# Patient Record
Sex: Female | Born: 1950 | Race: Black or African American | Hispanic: No | Marital: Married | Smoking: Former smoker
Health system: Southern US, Community
[De-identification: ages and names within clinical notes are randomized; demographics above are authoritative.]

## PROBLEM LIST (undated history)

## (undated) DIAGNOSIS — Z800 Family history of malignant neoplasm of digestive organs: Secondary | ICD-10-CM

## (undated) DIAGNOSIS — E119 Type 2 diabetes mellitus without complications: Secondary | ICD-10-CM

## (undated) DIAGNOSIS — N289 Disorder of kidney and ureter, unspecified: Secondary | ICD-10-CM

## (undated) DIAGNOSIS — L80 Vitiligo: Secondary | ICD-10-CM

## (undated) DIAGNOSIS — C50919 Malignant neoplasm of unspecified site of unspecified female breast: Secondary | ICD-10-CM

## (undated) DIAGNOSIS — I10 Essential (primary) hypertension: Secondary | ICD-10-CM

## (undated) DIAGNOSIS — C801 Malignant (primary) neoplasm, unspecified: Secondary | ICD-10-CM

## (undated) DIAGNOSIS — E039 Hypothyroidism, unspecified: Secondary | ICD-10-CM

## (undated) DIAGNOSIS — G4730 Sleep apnea, unspecified: Secondary | ICD-10-CM

## (undated) DIAGNOSIS — Z803 Family history of malignant neoplasm of breast: Secondary | ICD-10-CM

## (undated) DIAGNOSIS — M1990 Unspecified osteoarthritis, unspecified site: Secondary | ICD-10-CM

## (undated) DIAGNOSIS — H10022 Other mucopurulent conjunctivitis, left eye: Secondary | ICD-10-CM

## (undated) HISTORY — PX: CERVICAL FUSION: SHX112

## (undated) HISTORY — PX: CYSTECTOMY: SUR359

## (undated) HISTORY — DX: Family history of malignant neoplasm of breast: Z80.3

## (undated) HISTORY — PX: CHOLECYSTECTOMY: SHX55

## (undated) HISTORY — PX: UVULOPALATOPHARYNGOPLASTY, TONSILLECTOMY AND SEPTOPLASTY: SHX2632

## (undated) HISTORY — PX: EYE SURGERY: SHX253

## (undated) HISTORY — DX: Family history of malignant neoplasm of digestive organs: Z80.0

## (undated) HISTORY — PX: BREAST SURGERY: SHX581

## (undated) HISTORY — PX: PILONIDAL CYST EXCISION: SHX744

---

## 1984-06-16 HISTORY — PX: MYOMECTOMY: SHX85

## 1993-06-16 HISTORY — PX: PARTIAL HYSTERECTOMY: SHX80

## 1997-08-15 ENCOUNTER — Ambulatory Visit (HOSPITAL_COMMUNITY): Admission: RE | Admit: 1997-08-15 | Discharge: 1997-08-15 | Payer: Self-pay | Admitting: Internal Medicine

## 1997-10-02 ENCOUNTER — Inpatient Hospital Stay (HOSPITAL_COMMUNITY): Admission: RE | Admit: 1997-10-02 | Discharge: 1997-10-03 | Payer: Self-pay | Admitting: Neurosurgery

## 2000-05-22 ENCOUNTER — Ambulatory Visit (HOSPITAL_BASED_OUTPATIENT_CLINIC_OR_DEPARTMENT_OTHER): Admission: RE | Admit: 2000-05-22 | Discharge: 2000-05-22 | Payer: Self-pay | Admitting: Internal Medicine

## 2000-06-16 HISTORY — PX: TONSILLECTOMY: SUR1361

## 2000-09-09 ENCOUNTER — Ambulatory Visit (HOSPITAL_COMMUNITY): Admission: RE | Admit: 2000-09-09 | Discharge: 2000-09-10 | Payer: Self-pay | Admitting: *Deleted

## 2000-09-09 ENCOUNTER — Encounter: Payer: Self-pay | Admitting: *Deleted

## 2001-03-19 ENCOUNTER — Ambulatory Visit (HOSPITAL_BASED_OUTPATIENT_CLINIC_OR_DEPARTMENT_OTHER): Admission: RE | Admit: 2001-03-19 | Discharge: 2001-03-19 | Payer: Self-pay | Admitting: *Deleted

## 2001-05-26 ENCOUNTER — Ambulatory Visit (HOSPITAL_COMMUNITY): Admission: RE | Admit: 2001-05-26 | Discharge: 2001-05-26 | Payer: Self-pay | Admitting: *Deleted

## 2002-05-24 ENCOUNTER — Other Ambulatory Visit: Admission: RE | Admit: 2002-05-24 | Discharge: 2002-05-24 | Payer: Self-pay | Admitting: Obstetrics and Gynecology

## 2002-05-30 ENCOUNTER — Ambulatory Visit (HOSPITAL_COMMUNITY): Admission: RE | Admit: 2002-05-30 | Discharge: 2002-05-30 | Payer: Self-pay | Admitting: Obstetrics and Gynecology

## 2002-05-30 ENCOUNTER — Encounter: Payer: Self-pay | Admitting: Obstetrics and Gynecology

## 2003-07-04 ENCOUNTER — Other Ambulatory Visit: Admission: RE | Admit: 2003-07-04 | Discharge: 2003-07-04 | Payer: Self-pay | Admitting: Obstetrics and Gynecology

## 2003-07-21 ENCOUNTER — Encounter: Admission: RE | Admit: 2003-07-21 | Discharge: 2003-07-21 | Payer: Self-pay | Admitting: Obstetrics and Gynecology

## 2005-03-07 ENCOUNTER — Other Ambulatory Visit: Admission: RE | Admit: 2005-03-07 | Discharge: 2005-03-07 | Payer: Self-pay | Admitting: Obstetrics and Gynecology

## 2005-03-18 ENCOUNTER — Encounter: Admission: RE | Admit: 2005-03-18 | Discharge: 2005-03-18 | Payer: Self-pay | Admitting: Obstetrics and Gynecology

## 2006-03-09 ENCOUNTER — Other Ambulatory Visit: Admission: RE | Admit: 2006-03-09 | Discharge: 2006-03-09 | Payer: Self-pay | Admitting: Obstetrics and Gynecology

## 2017-06-16 HISTORY — PX: CATARACT EXTRACTION: SUR2

## 2017-09-30 DIAGNOSIS — M722 Plantar fascial fibromatosis: Secondary | ICD-10-CM | POA: Insufficient documentation

## 2017-10-20 ENCOUNTER — Encounter (INDEPENDENT_AMBULATORY_CARE_PROVIDER_SITE_OTHER): Payer: Self-pay | Admitting: Ophthalmology

## 2017-10-21 ENCOUNTER — Encounter (INDEPENDENT_AMBULATORY_CARE_PROVIDER_SITE_OTHER): Payer: BLUE CROSS/BLUE SHIELD | Admitting: Ophthalmology

## 2017-10-21 DIAGNOSIS — H43813 Vitreous degeneration, bilateral: Secondary | ICD-10-CM

## 2017-10-21 DIAGNOSIS — E11319 Type 2 diabetes mellitus with unspecified diabetic retinopathy without macular edema: Secondary | ICD-10-CM

## 2017-10-21 DIAGNOSIS — E113292 Type 2 diabetes mellitus with mild nonproliferative diabetic retinopathy without macular edema, left eye: Secondary | ICD-10-CM

## 2017-10-21 DIAGNOSIS — H2513 Age-related nuclear cataract, bilateral: Secondary | ICD-10-CM

## 2017-10-21 DIAGNOSIS — H35373 Puckering of macula, bilateral: Secondary | ICD-10-CM

## 2017-10-22 ENCOUNTER — Encounter (INDEPENDENT_AMBULATORY_CARE_PROVIDER_SITE_OTHER): Payer: Self-pay | Admitting: Ophthalmology

## 2017-12-07 ENCOUNTER — Other Ambulatory Visit: Payer: Self-pay | Admitting: General Surgery

## 2017-12-07 ENCOUNTER — Ambulatory Visit
Admission: RE | Admit: 2017-12-07 | Discharge: 2017-12-07 | Disposition: A | Discharge status: Home / Self Care | Payer: BLUE CROSS/BLUE SHIELD | Source: Ambulatory Visit | Attending: General Surgery | Admitting: General Surgery

## 2017-12-07 DIAGNOSIS — Z170 Estrogen receptor positive status [ER+]: Principal | ICD-10-CM

## 2017-12-07 DIAGNOSIS — C50411 Malignant neoplasm of upper-outer quadrant of right female breast: Secondary | ICD-10-CM

## 2017-12-11 ENCOUNTER — Ambulatory Visit
Admission: RE | Admit: 2017-12-11 | Discharge: 2017-12-11 | Disposition: A | Discharge status: Home / Self Care | Payer: BLUE CROSS/BLUE SHIELD | Source: Ambulatory Visit | Attending: General Surgery | Admitting: General Surgery

## 2017-12-11 ENCOUNTER — Other Ambulatory Visit: Payer: Self-pay | Admitting: General Surgery

## 2017-12-11 DIAGNOSIS — Z170 Estrogen receptor positive status [ER+]: Principal | ICD-10-CM

## 2017-12-11 DIAGNOSIS — C50411 Malignant neoplasm of upper-outer quadrant of right female breast: Secondary | ICD-10-CM

## 2017-12-15 ENCOUNTER — Encounter: Payer: Self-pay | Admitting: *Deleted

## 2017-12-15 ENCOUNTER — Inpatient Hospital Stay: Payer: BLUE CROSS/BLUE SHIELD | Attending: Hematology and Oncology | Admitting: Hematology and Oncology

## 2017-12-15 DIAGNOSIS — C50411 Malignant neoplasm of upper-outer quadrant of right female breast: Secondary | ICD-10-CM | POA: Diagnosis present

## 2017-12-15 DIAGNOSIS — Z170 Estrogen receptor positive status [ER+]: Secondary | ICD-10-CM

## 2017-12-15 DIAGNOSIS — Z8042 Family history of malignant neoplasm of prostate: Secondary | ICD-10-CM

## 2017-12-15 DIAGNOSIS — E119 Type 2 diabetes mellitus without complications: Secondary | ICD-10-CM | POA: Diagnosis not present

## 2017-12-15 DIAGNOSIS — Z79899 Other long term (current) drug therapy: Secondary | ICD-10-CM | POA: Diagnosis not present

## 2017-12-15 DIAGNOSIS — Z800 Family history of malignant neoplasm of digestive organs: Secondary | ICD-10-CM

## 2017-12-15 DIAGNOSIS — C773 Secondary and unspecified malignant neoplasm of axilla and upper limb lymph nodes: Secondary | ICD-10-CM | POA: Diagnosis not present

## 2017-12-15 DIAGNOSIS — Z7984 Long term (current) use of oral hypoglycemic drugs: Secondary | ICD-10-CM | POA: Diagnosis not present

## 2017-12-15 NOTE — Progress Notes (Signed)
[facility] CONSULT NOTE  Patient Care Team: [name], MD as PCP - General (Family Medicine)  CHIEF COMPLAINTS/PURPOSE OF CONSULTATION:  Newly diagnosed breast cancer  HISTORY OF PRESENTING ILLNESS:  Charlene Perry 67 y.o. female is here because of recent diagnosis of right breast cancer.  Patient felt a nodule on the right breast and brought to the attention of her physician.  Mammogram was obtained which revealed abnormality in the right breast with distortion.  She then underwent ultrasound which revealed a 1.4 cm lesion.  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  She was referred to see Dr. Donne Hazel who then obtained an axillary ultrasound and it revealed a lymph node which was then biopsied.  Biopsy revealed invasive ductal carcinoma.  She was sent to [place] for discussion regarding adjuvant treatment options.  I reviewed her records extensively and collaborated the history with the patient.  SUMMARY OF ONCOLOGIC HISTORY:   Malignant neoplasm of upper-outer quadrant of right breast in female, estrogen receptor positive ([place])   11/12/2017 Initial Diagnosis    Palpable lesion in the right breast upper outer quadrant mammogram and ultrasound revealed 1.4 cm tumor ultrasound-guided biopsy revealed grade 2 IDC ER 3+, PR 3+, HER-2 negative with a Ki-67 of 10%.  Right axillary lymph node biopsy positive, T1cN1 stage I a clinical stage     MEDICAL HISTORY:  Osteoarthritis, gallstones, diabetes, hemorrhoids, sleep apnea, hypothyroidism SURGICAL HISTORY: Cataract surgery, C-section, gallbladder surgery, hysterectomy, spine surgery in the neck SOCIAL HISTORY: Married denies any tobacco alcohol or recreational drug use FAMILY HISTORY: Colon cancer in brother mother and sister, prostate cancer father  ALLERGIES:  has No Known Allergies.  MEDICATIONS:  Current Outpatient  Medications  Medication Sig Dispense Refill  . celecoxib (CELEBREX) 200 MG capsule Take 200 mg by mouth daily.    . furosemide (LASIX) 80 MG tablet Take 80 mg by mouth daily.    Marland Kitchen gabapentin (NEURONTIN) 400 MG capsule Take 400 mg by mouth 2 (two) times daily.    Marland Kitchen levothyroxine (SYNTHROID, LEVOTHROID) 125 MCG tablet Take 125 mcg by mouth daily before breakfast.    . metFORMIN (GLUCOPHAGE) 1000 MG tablet Take 1,000 mg by mouth 2 (two) times daily with a meal.    . potassium chloride (K-DUR,KLOR-CON) 10 MEQ tablet Take 10 mEq by mouth 2 (two) times daily.    . rosuvastatin (CRESTOR) 40 MG tablet Take 40 mg by mouth daily.     No current facility-administered medications for this visit.     REVIEW OF SYSTEMS:   Constitutional: Denies fevers, chills or abnormal night sweats Eyes: Denies blurriness of vision, double vision or watery eyes Ears, nose, mouth, throat, and face: Denies mucositis or sore throat Respiratory: Denies cough, dyspnea or wheezes Cardiovascular: Denies palpitation, chest discomfort or lower extremity swelling Gastrointestinal:  Denies nausea, heartburn or change in bowel habits Skin: Denies abnormal skin rashes Lymphatics: Denies new lymphadenopathy or easy bruising Neurological:Denies numbness, tingling or new weaknesses Behavioral/Psych: Mood is stable, no new changes  Breast:  Denies any palpable lumps or discharge All other systems were reviewed with the patient and are negative.  PHYSICAL EXAMINATION: ECOG PERFORMANCE STATUS: 1 - Symptomatic but completely ambulatory  Vitals:   12/15/17 1233  BP: (!) 158/70  Pulse: 91  Resp: 19  Temp: 98.1 F (36.7 C)  SpO2: 98%   Filed Weights   12/15/17 1233  Weight: 226 lb 14.4 oz (102.9  kg)    GENERAL:alert, no distress and comfortable SKIN: skin color, texture, turgor are normal, no rashes or significant lesions EYES: normal, conjunctiva are pink and non-injected, sclera clear OROPHARYNX:no exudate, no erythema  and lips, buccal mucosa, and tongue normal  NECK: supple, thyroid normal size, non-tender, without nodularity LYMPH:  no palpable lymphadenopathy in the cervical, axillary or inguinal LUNGS: clear to auscultation and percussion with normal breathing effort HEART: regular rate & rhythm and no murmurs and no lower extremity edema ABDOMEN:abdomen soft, non-tender and normal bowel sounds Musculoskeletal:no cyanosis of digits and no clubbing  PSYCH: alert & oriented x 3 with fluent speech NEURO: no focal motor/sensory deficits  RADIOGRAPHIC STUDIES: I have personally reviewed the radiological reports and agreed with the findings in the report.  ASSESSMENT AND PLAN:  Malignant neoplasm of upper-outer quadrant of right breast in female, estrogen receptor positive ([place]) 11/12/2017:Palpable lesion in the right breast upper outer quadrant mammogram and ultrasound revealed 1.4 cm tumor ultrasound-guided biopsy revealed grade 2 IDC ER 3+, PR 3+, HER-2 negative with a Ki-67 of 10%.  Right axillary lymph node biopsy positive, T1cN1 stage I a clinical stage  Pathology and radiology counseling:Discussed with the patient, the details of pathology including the type of breast cancer,the clinical staging, the significance of ER, PR and HER-2/neu receptors and the implications for treatment. After reviewing the pathology in detail, we proceeded to discuss the different treatment options between surgery, radiation, chemotherapy, antiestrogen therapies.  Recommendations: 1. Breast conserving surgery followed by 2. Mammaprint testing to determine if chemotherapy would be of any benefit followed by 3. Adjuvant radiation therapy followed by 4. Adjuvant antiestrogen therapy  Mammaprint counseling: MINDACT is a prospective, randomized phase III controlled trial that investigates the clinical utility of MammaPrint, when compared to standard clinical pathological criteria, with 6,693 patients enrolled from over 111  institutions. Clinical high-risk patients with a Low Risk MammaPrint result, including 48% node-positive, had 5-year distant metastasis-free survival rate in excess of 94 percent, whether randomized to receive adjuvant chemotherapy or not proving MammaPrint's ability to safely identify Low Risk patients.  Return to clinic after surgery to discuss final pathology report and then determine if Mammaprint testing will need to be sent.     All questions were answered. The patient knows to call the clinic with any problems, questions or concerns.    Harriette Ohara, MD 12/15/17

## 2017-12-15 NOTE — Assessment & Plan Note (Signed)
11/12/2017:Palpable lesion in the right breast upper outer quadrant mammogram and ultrasound revealed 1.4 cm tumor ultrasound-guided biopsy revealed grade 2 IDC ER 3+, PR 3+, HER-2 negative with a Ki-67 of 10%.  Right axillary lymph node biopsy positive, T1cN1 stage I a clinical stage  Pathology and radiology counseling:Discussed with the patient, the details of pathology including the type of breast cancer,the clinical staging, the significance of ER, PR and HER-2/neu receptors and the implications for treatment. After reviewing the pathology in detail, we proceeded to discuss the different treatment options between surgery, radiation, chemotherapy, antiestrogen therapies.  Recommendations: 1. Breast conserving surgery followed by 2. Mammaprint testing to determine if chemotherapy would be of any benefit followed by 3. Adjuvant radiation therapy followed by 4. Adjuvant antiestrogen therapy  Mammaprint counseling: MINDACT is a prospective, randomized phase III controlled trial that investigates the clinical utility of MammaPrint, when compared to standard clinical pathological criteria, with 6,693 patients enrolled from over 111 institutions. Clinical high-risk patients with a Low Risk MammaPrint result, including 48% node-positive, had 5-year distant metastasis-free survival rate in excess of 94 percent, whether randomized to receive adjuvant chemotherapy or not proving MammaPrint's ability to safely identify Low Risk patients.  Return to clinic after surgery to discuss final pathology report and then determine if Mammaprint testing will need to be sent.

## 2017-12-16 ENCOUNTER — Telehealth: Payer: Self-pay | Admitting: *Deleted

## 2017-12-16 ENCOUNTER — Other Ambulatory Visit: Payer: Self-pay | Admitting: General Surgery

## 2017-12-16 ENCOUNTER — Encounter: Payer: Self-pay | Admitting: Radiation Oncology

## 2017-12-16 NOTE — Telephone Encounter (Signed)
Confirmed appointment with patient for Dr. Lisbeth Renshaw on 7/9 at 1230pm and then genetics at 2pm.

## 2017-12-21 NOTE — Progress Notes (Signed)
Location of Breast Cancer: Malignant neoplasm of upper outer quadrant of right female breast, ER/PR +  Did patient present with symptoms (if so, please note symptoms) or was this found on screening mammography?:  Patient felt a nodule in the right breast.  Mammogram: abnormality in the right breast with distortion.  Ultrasound: 1.4 cm lesion  Histology per Pathology Report: Right Breast 12/16/2017  Receptor Status: ER(+ 100%), PR (+ 70%), Her2-neu (-), Ki-67(10%)  Right Axilla 12/11/2017    Past/Anticipated interventions by surgeon, if any: Plan is for lumpectomy with Dr. Donne Hazel  Past/Anticipated interventions by medical oncology, if any: Chemotherapy  Dr. Lindi Adie 12/15/2017 1. Breast conserving surgery followed by 2. Mammaprint testing to determine if chemotherapy would be of any benefit followed by 3. Adjuvant radiation therapy followed by 4. Adjuvant antiestrogen therapy   Lymphedema issues, if any:    Pain issues, if any:    BP (!) 152/91 (BP Location: Right Arm, Patient Position: Sitting, Cuff Size: Normal)   Pulse 79   Temp 98.4 F (36.9 C) (Oral)   Resp 18   Ht _0  (1.651 m)   Wt 225 lb 9.6 oz (102.3 kg)   SpO2 97%   BMI 37.54 kg/m    Wt Readings from Last 3 Encounters:  12/22/17 225 lb 9.6 oz (102.3 kg)  12/15/17 226 lb 14.4 oz (102.9 kg)    SAFETY ISSUES:  Prior radiation?   Pacemaker/ICD? No  Possible current pregnancy? No  Is the patient on methotrexate? No  Current Complaints / other details:      Cori Razor, RN 12/21/2017,9:34 AM

## 2017-12-22 ENCOUNTER — Ambulatory Visit (HOSPITAL_BASED_OUTPATIENT_CLINIC_OR_DEPARTMENT_OTHER): Payer: BLUE CROSS/BLUE SHIELD | Admitting: Genetics

## 2017-12-22 ENCOUNTER — Other Ambulatory Visit: Payer: Self-pay

## 2017-12-22 ENCOUNTER — Inpatient Hospital Stay: Payer: BLUE CROSS/BLUE SHIELD

## 2017-12-22 ENCOUNTER — Ambulatory Visit
Admission: RE | Admit: 2017-12-22 | Discharge: 2017-12-22 | Disposition: A | Discharge status: Home / Self Care | Payer: BLUE CROSS/BLUE SHIELD | Source: Ambulatory Visit | Attending: Radiation Oncology | Admitting: Radiation Oncology

## 2017-12-22 ENCOUNTER — Encounter: Payer: Self-pay | Admitting: Radiation Oncology

## 2017-12-22 ENCOUNTER — Encounter: Payer: Self-pay | Admitting: Genetics

## 2017-12-22 VITALS — BP 152/91 | HR 79 | Temp 98.4°F | Resp 18 | Ht 65.0 in | Wt 225.6 lb

## 2017-12-22 DIAGNOSIS — Z170 Estrogen receptor positive status [ER+]: Secondary | ICD-10-CM | POA: Diagnosis present

## 2017-12-22 DIAGNOSIS — Z803 Family history of malignant neoplasm of breast: Secondary | ICD-10-CM

## 2017-12-22 DIAGNOSIS — Z800 Family history of malignant neoplasm of digestive organs: Secondary | ICD-10-CM

## 2017-12-22 DIAGNOSIS — C50411 Malignant neoplasm of upper-outer quadrant of right female breast: Secondary | ICD-10-CM

## 2017-12-22 DIAGNOSIS — C773 Secondary and unspecified malignant neoplasm of axilla and upper limb lymph nodes: Secondary | ICD-10-CM | POA: Diagnosis not present

## 2017-12-22 DIAGNOSIS — Z7982 Long term (current) use of aspirin: Secondary | ICD-10-CM | POA: Insufficient documentation

## 2017-12-22 HISTORY — DX: Vitiligo: L80

## 2017-12-22 NOTE — Progress Notes (Signed)
REFERRING PROVIDER: [name], MD [place], [place] [address]   PRIMARY PROVIDER:  [name], MD  PRIMARY REASON FOR VISIT:  1. Malignant neoplasm of upper-outer quadrant of right breast in female, estrogen receptor positive ([place])   2. Family history of colon cancer   3. Family history of breast cancer     HISTORY OF PRESENT ILLNESS:   Charlene Perry, a 67 y.o. female, was seen for a [facility] cancer genetics consultation at the request of Dr. Lorne Skeens due to a personal and family history of cancer.  Charlene Perry presents to clinic today to discuss the possibility of a hereditary predisposition to cancer, genetic testing, and to further clarify her future cancer risks, as well as potential cancer risks for family members.   On 11/12/217, at the age of 13, Charlene Perry was diagnosed with Invasive ductal carcinoma of the right breast ER+, PR+, HER2-.    She is planning breast conserving surgery followed by adjuvant radiation and adjuvant antiestrogen therapy.   CANCER HISTORY:    Malignant neoplasm of upper-outer quadrant of right breast in female, estrogen receptor positive ([place])   11/12/2017 Initial Diagnosis    Palpable lesion in the right breast upper outer quadrant mammogram and ultrasound revealed 1.4 cm tumor ultrasound-guided biopsy revealed grade 2 IDC ER 3+, PR 3+, HER-2 negative with a Ki-67 of 10%.  Right axillary lymph node biopsy positive, T1cN1 stage I a clinical stage        HORMONAL RISK FACTORS:  First live birth at age 51.  Ovaries intact: yes.  Hysterectomy: yes.  Menopausal status: postmenopausal.  Colonoscopy: yes; normal, goes every 5 years is schedule for next one in Aug 2019. Marland Kitchen   Past Medical History:  Diagnosis Date  . Family history of breast cancer   . Family history of colon cancer     Past Surgical History:  Procedure Laterality Date  . CATARACT EXTRACTION Right 2019  . CERVICAL  FUSION N/A    C5 &6  . CHOLECYSTECTOMY  2004-2005  . CYSTECTOMY    . MYOMECTOMY  1986  . PARTIAL HYSTERECTOMY  1995    Social History   Socioeconomic History  . Marital status: Married    Spouse name: Not on file  . Number of children: Not on file  . Years of education: Not on file  . Highest education level: Not on file  Occupational History  . Not on file  Social Needs  . Financial resource strain: Not on file  . Food insecurity:    Worry: Not on file    Inability: Not on file  . Transportation needs:    Medical: Not on file    Non-medical: Not on file  Tobacco Use  . Smoking status: Never Smoker  . Smokeless tobacco: Never Used  Substance and Sexual Activity  . Alcohol use: Not Currently  . Drug use: Never  . Sexual activity: Not on file  Lifestyle  . Physical activity:    Days per week: Not on file    Minutes per session: Not on file  . Stress: Not on file  Relationships  . Social connections:    Talks on phone: Not on file    Gets together: Not on file    Attends religious service: Not on file    Active member of club or organization: Not on file    Attends meetings of clubs or organizations: Not on file    Relationship status: Not on file  Other Topics Concern  . Not on file  Social History Narrative  . Not on file     FAMILY HISTORY:  We obtained a detailed, 4-generation family history.  Significant diagnoses are listed below: Family History  Problem Relation Age of Onset  . Colon cancer Mother 29  . Colon cancer Sister 23  . Colon cancer Brother        ex early 80's  . Breast cancer Other        52's   Charlene Perry has a 76 year-old son with no history of cancer.  Charlene Perry has an 86 month old granddaughter. Charlene Perry has 2 sisters and 7 brothers; -1 brother has no history of cancer.  He has a daughter who had breast cancer I her 96's. -1 brother was dx with colon cancer in his early 62's, he had a history of smoking -5 brothers  with no history of cancer.  -1 sister was diagnosed with colon cancer I her 29's and is now 13.  She has a hx of smoking.  -1 sister with no history of cancer.   Charlene Perry father: died at 49 with no history of cancer.  He had his "prostate trimmed" but no cancer.  Paternal Aunts/Uncles: many aunts/uncles, only was is known and he is deceased with no known history of cancer.  Paternal cousins: no history of cancer.  Paternal grandfather: unk Paternal grandmother:unk  Charlene Perry mother: died at 65.  She had a history of colon cancer dx at 21.  She used to smoke 'snuff' Maternal Aunts/Uncles: 4 maternal aunts, 1 maternal half-uncle- no known history of cancer.  Maternal cousins: no known history of cancer.  Maternal grandfather: unk Maternal grandmother:unk  Charlene Perry is unaware of previous family history of genetic testing for hereditary cancer risks. Patient's maternal ancestors are of African American descent, and paternal ancestors are of African American/Caucasian descent. There is no reported Ashkenazi Jewish ancestry. There is no known consanguinity.  GENETIC COUNSELING ASSESSMENT: Charlene Perry is a 67 y.o. female with a personal and family history  which is somewhat suggestive of a Hereditary Cancer Predisposition Syndrome. We, therefore, discussed and recommended the following at today's visit.   DISCUSSION: We reviewed the characteristics, features and inheritance patterns of hereditary cancer syndromes. We also discussed genetic testing, including the appropriate family members to test, the process of testing, insurance coverage and turn-around-time for results. We discussed the implications of a negative, positive and/or variant of uncertain significant result. We recommended Charlene Perry pursue genetic testing for the Common Hereditary Caners gene panel.   The Common Hereditary Cancer Panel offered by [facility] includes sequencing and/or  deletion duplication testing of the following 47 genes: APC, ATM, AXIN2, BARD1, BMPR1A, BRCA1, BRCA2, BRIP1, CDH1, CDKN2A (p14ARF), CDKN2A (p16INK4a), CKD4, CHEK2, CTNNA1, DICER1, EPCAM (Deletion/duplication testing only), GREM1 (promoter region deletion/duplication testing only), KIT, MEN1, MLH1, MSH2, MSH3, MSH6, MUTYH, NBN, NF1, NHTL1, PALB2, PDGFRA, PMS2, POLD1, POLE, PTEN, RAD50, RAD51C, RAD51D, SDHB, SDHC, SDHD, SMAD4, SMARCA4. STK11, TP53, TSC1, TSC2, and VHL.  The following genes were evaluated for sequence changes only: SDHA and HOXB13 c.251G>A variant only.  We discussed that only 5-10% of cancers are associated with a Hereditary cancer predisposition syndrome.  One of the most common hereditary cancer syndromes that increases breast cancer risk is called Hereditary Breast and Ovarian Cancer (HBOC) syndrome.  This syndrome is caused by mutations in the BRCA1 and BRCA2 genes.  This syndrome increases an individual's lifetime risk to develop  breast, ovarian, pancreatic, and other types of cancer.    The most common hereditary cancer syndrome associated with colon cancer is Lynch Syndrome.  Lynch Syndrome is caused by mutations in the genes: MLH1, MSH2, MSH6, PMS2 and EPCAM.  This syndrome increases the risk for colon, uterine, ovarian and stomach cancers, as well as others.  Families with Lynch Syndrome tend to have multiple family members with these cancers, typically diagnosed under age 10, and diagnoses in multiple generations.  There are also many other cancer predisposition syndromes caused by mutations in several other genes.  We discussed that if she is found to have a mutation in one of these genes, it may impact future medical management recommendations such as increased cancer screenings and consideration of risk reducing surgeries.  A positive result could also have implications for the patient's family members.  A Negative result would mean we were unable to identify a hereditary  component to her cancer, but does not rule out the possibility of a hereditary basis for her/her family's cancer.  There could be mutations that are undetectable by current technology, or in genes not yet tested or identified to increase cancer risk.    We discussed the potential to find a Variant of Uncertain Significance or VUS.  These are variants that have not yet been identified as pathogenic or benign, and it is unknown if this variant is associated with increased cancer risk or if this is a normal finding.  Most VUS's are reclassified to benign or likely benign.   It should not be used to make medical management decisions. With time, we suspect the lab will determine the significance of any VUS's identified if any.   Based on Ms. Bryant personal and family history of cancer, she meets medical criteria for genetic testing. Despite that she meets criteria, she may still have an out of pocket cost. The laboratory can provide her with an estimate of her OOP cost.   PLAN: After considering the risks, benefits, and limitations, Charlene Perry  provided informed consent to pursue genetic testing and the blood sample was sent to [facility] for analysis of the Common Hereditary Cancers Panel. Results should be available within approximately 2-3 weeks' time, at which point they will be disclosed by telephone to Charlene Perry, as will any additional recommendations warranted by these results. Ms. Vinal will receive a summary of her genetic counseling visit and a copy of her results once available. This information will also be available in Epic. We encouraged Charlene Perry to remain in contact with cancer genetics annually so that we can continuously update the family history and inform her of any changes in cancer genetics and testing that may be of benefit for her family. Ms. Dimarzo questions were answered to her satisfaction today. Our contact information was  provided should additional questions or concerns arise.  Lastly, we encouraged Charlene Perry to remain in contact with cancer genetics annually so that we can continuously update the family history and inform her of any changes in cancer genetics and testing that may be of benefit for this family.   Ms.  Urda questions were answered to her satisfaction today. Our contact information was provided should additional questions or concerns arise. Thank you for the referral and allowing [place] to share in the care of your patient.   Tana Felts, MS, [facility] Certified Genetic Counselor [email or web address] phone: [phone]  The patient was seen for a total of 40 minutes in face-to-face genetic counseling.  The  patient was accompanied today by her husband.  This patient was discussed with Drs. Magrinat, Lindi Adie and/or Burr Medico who agrees with the above.

## 2017-12-22 NOTE — Progress Notes (Signed)
Radiation Oncology         [phone] ________________________________  Name: Charlene Perry        MRN: [id number]  Date of Service: 12/22/2017 DOB: Jun 18, 1950  [id number], Charlene Musa, MD  [name], MD     REFERRING PHYSICIAN: [name], MD   DIAGNOSIS: The encounter diagnosis was Malignant neoplasm of upper-outer quadrant of right breast in female, estrogen receptor positive ([place]).   HISTORY OF PRESENT ILLNESS: NEYSHA Perry is a 67 y.o. female seen at the request of Dr. Lindi Adie  for a new diagnosis of right breast cancer. The patient was noted to have in the right breast which prompted evaluation with her provider and subsequent diagnostic mammogram on 11/12/2017 that revealed a suspicious abnormality, per notes about 1.4 cm in the right breast.  This prompted a biopsy at 10:00 of the right breast revealing an invasive and in situ mammary carcinoma supporting a lobular phenotype, grade 1-2.  The outside review revealed ER PR positive, HER-2/neu negative, Ki-67 of 10%.  When she presented for evaluation in [place], she was counseled on further assessment of the axilla, and subsequent ultrasound revealed two low level axillary nodes with minimal fat, and normal fatty nodes nearby, and a biopsy of one of the two suspcious lymph nodes revealed metastatic carcinoma in the lymph node, ER PR positive, HER-2 negative, Ki-67 of 10%. She is planning to proceed with breast conserving surgery followed by MammaPrint testing.  She comes today to meet with [place] to discuss options of adjuvant radiotherapy.   PREVIOUS RADIATION THERAPY: No   PAST MEDICAL HISTORY:  Past Medical History:  Diagnosis Date  . Family history of breast cancer   . Family history of colon cancer   . Vitiligo        PAST SURGICAL HISTORY: Past Surgical History:  Procedure Laterality Date  . CATARACT EXTRACTION Right 2019  . CERVICAL FUSION N/A    C5 &6  . CHOLECYSTECTOMY  2004-2005  .  CYSTECTOMY    . MYOMECTOMY  1986  . PARTIAL HYSTERECTOMY  1995     FAMILY HISTORY:  Family History  Problem Relation Age of Onset  . Colon cancer Mother 30  . Colon cancer Sister 46  . Colon cancer Brother        ex early 10's  . Breast cancer Other        40's     SOCIAL HISTORY:  reports that she has never smoked. She has never used smokeless tobacco. She reports that she drank alcohol. She reports that she does not use drugs. The patient is married and lives in [place], [place]. 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   ALLERGIES: Patient has no known allergies.   MEDICATIONS:  Current Outpatient Medications  Medication Sig Dispense Refill  . aspirin 81 MG chewable tablet 1 PO DAILY    . celecoxib (CELEBREX) 200 MG capsule Take 200 mg by mouth daily.    . furosemide (LASIX) 80 MG tablet Take 80 mg by mouth daily.    Marland Kitchen gabapentin (NEURONTIN) 400 MG capsule Take 400 mg by mouth 2 (two) times daily.    Marland Kitchen levothyroxine (SYNTHROID, LEVOTHROID) 125 MCG tablet Take 125 mcg by mouth daily before breakfast.    . metFORMIN (GLUCOPHAGE) 1000 MG tablet Take 1,000 mg by mouth 2 (two) times daily with a meal.    . potassium chloride (K-DUR,KLOR-CON) 10 MEQ tablet Take 10 mEq by mouth 2 (two) times daily.    Marland Kitchen  rosuvastatin (CRESTOR) 40 MG tablet Take 40 mg by mouth daily.    . benzonatate (TESSALON PERLES) 100 MG capsule Tessalon Perles 100 mg capsule  Take 1 capsule 3 times a day by oral route as needed.     No current facility-administered medications for this encounter.      REVIEW OF SYSTEMS: On review of systems, the patient reports that she is doing well overall. She denies any chest pain, shortness of breath, cough, fevers, chills, night sweats, unintended weight changes. She denies any bowel or bladder disturbances, and denies abdominal pain, nausea or vomiting. She denies any new musculoskeletal or joint aches or pains. A complete review of systems  is obtained and is otherwise negative.     PHYSICAL EXAM:  Wt Readings from Last 3 Encounters:  12/22/17 225 lb 9.6 oz (102.3 kg)  12/15/17 226 lb 14.4 oz (102.9 kg)   Temp Readings from Last 3 Encounters:  12/22/17 98.4 F (36.9 C) (Oral)  12/15/17 98.1 F (36.7 C) (Oral)   BP Readings from Last 3 Encounters:  12/22/17 (!) 152/91  12/15/17 (!) 158/70   Pulse Readings from Last 3 Encounters:  12/22/17 79  12/15/17 91     In general this is a well appearing caucasian female in no acute distress. She is alert and oriented x4 and appropriate throughout the examination. HEENT reveals that the patient is normocephalic, atraumatic. EOMs are intact.. Skin is intact without any evidence of gross lesions. Cardiopulmonary assessment is negative for acute distress and she exhibits normal effort. Breast exam is deferred.  ECOG = 1  0 - Asymptomatic (Fully active, able to carry on all predisease activities without restriction)  1 - Symptomatic but completely ambulatory (Restricted in physically strenuous activity but ambulatory and able to carry out work of a light or sedentary nature. For example, light housework, office work)  2 - Symptomatic, <50% in bed during the day (Ambulatory and capable of all self care but unable to carry out any work activities. Up and about more than 50% of waking hours)  3 - Symptomatic, >50% in bed, but not bedbound (Capable of only limited self-care, confined to bed or chair 50% or more of waking hours)  4 - Bedbound (Completely disabled. Cannot carry on any self-care. Totally confined to bed or chair)  5 - Death   Eustace Pen MM, Creech RH, Tormey DC, et al. [phone]). "Toxicity and response criteria of the [facility]". [place] Oncol. 5 (6): 649-55    LABORATORY DATA:  No results found for: WBC, HGB, HCT, MCV, PLT No results found for: NA, K, CL, CO2 No results found for: ALT, AST, GGT, ALKPHOS, BILITOT    RADIOGRAPHY: [place]  Axillary Node Core Biopsy Right  Addendum Date: 12/14/2017   ADDENDUM REPORT: 12/14/2017 13:16 ADDENDUM: Pathology revealed METASTATIC CARCINOMA of the Right axillary lymph node. This was found to be concordant by Dr. Kristopher Oppenheim. Pathology results were discussed with the patient by telephone. The patient reported doing well after the biopsy with tenderness at the site. Post biopsy instructions and care were reviewed and questions were answered. The patient was encouraged to call [facility] for any additional concerns. The patient has a recent diagnosis of right breast cancer and should follow her outlined treatment plan. Dr. [name] was notified of pathology results via EPIC message on December 14, 2017. Pathology results reported by Terie Purser, RN on 12/14/2017. Electronically Signed   By: Kristopher Oppenheim  M.D.   On: 12/14/2017 13:16   Result Date: 12/14/2017 CLINICAL DATA:  67 year old female biopsy proven right breast cancer presenting for ultrasound-guided biopsy an indeterminate right axillary lymph node. EXAM: [place] AXILLARY NODE CORE BIOPSY RIGHT COMPARISON:  Previous exam(s). FINDINGS: I met with the patient and we discussed the procedure of ultrasound-guided biopsy, including benefits and alternatives. We discussed the high likelihood of a successful procedure. We discussed the risks of the procedure, including infection, bleeding, tissue injury, clip migration, and inadequate sampling. Informed written consent was given. The usual time-out protocol was performed immediately prior to the procedure. Using sterile technique and 1% Lidocaine as local anesthetic, under direct ultrasound visualization, a 14 gauge spring-loaded device was used to perform biopsy of a right axillary lymph node using a lateral approach. At the conclusion of the procedure a HydroMARK tissue marker clip was deployed into the biopsy cavity. Follow up 2 view mammogram was performed and dictated  separately. IMPRESSION: Ultrasound guided biopsy of an indeterminate right axillary lymph. No apparent complications. Electronically Signed: By: Kristopher Oppenheim M.D. On: 12/11/2017 09:29   [place] Axilla Right  Result Date: 12/07/2017 CLINICAL DATA:  Patient as a recently biopsied right breast carcinoma. She was seen today by Dr. Donne Hazel for surgical consultation. No axillary ultrasound had been performed. The patient now comes to the breast Center for sonographic evaluation of the right axilla prior to resection of a right breast carcinoma. EXAM: ULTRASOUND OF THE RIGHT BREAST COMPARISON:  Diagnostic mammography and breast ultrasound dated 11/12/2017. FINDINGS: On physical exam, no axillary masses palpated. Targeted ultrasound is performed, showing 2 adjacent low axillary lymph nodes with minimal hilar fat. Both of these lymph nodes are normal in overall size, with short axis is between 5 and 6 mm. There are other axillary lymph nodes that are normal in size and configuration with prominent hilar fat. IMPRESSION: 1. Recommend ultrasound-guided core needle biopsy of 1 of the 2 adjacent, abnormal appearing lymph nodes. If the results are benign, both can be considered benign/reactive since they are similar in size and appearance. RECOMMENDATION: Ultrasound-guided core needle biopsy of 1 of the 2 abnormal appearing lymph nodes in the right axilla. I have discussed the findings and recommendations with the patient. Results were also provided in writing at the conclusion of the visit. If applicable, a reminder letter will be sent to the patient regarding the next appointment. BI-RADS CATEGORY  4: Suspicious. Electronically Signed   By: Lajean Manes M.D.   On: 12/07/2017 16:11       IMPRESSION/PLAN: 1. 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 Dr. Lisbeth Renshaw discusses the pathology findings and reviews the nature of invasive breast disease. The consensus from the breast  conference includes breast conservation with lumpectomy with  sentinel node biopsy. Her tumor will be tested with Mammaprint to determine a role for chemotherapy. Provided that chemotherapy is not indicated, the patient's course would then be followed by external radiotherapy to the breast followed by antiestrogen therapy. We discussed the risks, benefits, short, and long term effects of radiotherapy, and the patient is interested in proceeding. Dr. Lisbeth Renshaw discusses the delivery and logistics of radiotherapy and anticipates a course of 6 1/2 weeks of radiotherapy to the breast and regional nodes. We will see her back about 2 weeks after surgery to discuss the simulation process and anticipate we starting radiotherapy about 4-6 weeks after surgery.  2. Possible genetic predisposition to malignancy. The patient is a candidate for genetic  testing given her personal and family history. She was offered referral and already has plans to proceed with genetic consultation this afternoon.  In a visit lasting 60 minutes, greater than 50% of the time was spent face to face discussing her case, and coordinating the patient's care.   The above documentation reflects my direct findings during this shared patient visit. Please see the separate note by Dr. Lisbeth Renshaw on this date for the remainder of the patient's plan of care.    Carola Rhine, PAC

## 2017-12-23 ENCOUNTER — Encounter: Payer: Self-pay | Admitting: General Practice

## 2017-12-23 NOTE — Progress Notes (Signed)
[facility] Psychosocial Distress Screening Clinical Social Work  Clinical Social Work was referred by distress screening protocol.  The patient scored a 7 on the Psychosocial Distress Thermometer which indicates moderate distress. Clinical Social Worker contacted patient by phone to assess for distress and other psychosocial needs. CSW and patient discussed common feeling and emotions when being diagnosed with cancer, and the importance of support during treatment. CSW informed patient of the support team and support services at [facility]. CSW provided contact information and encouraged patient to call with any questions or concerns.  Patient wants to meet w surgeon and evaluate options for treatment - lives quite a distance from [facility] and will investigate local resources also.  Encouraged her to contact us as needed for support and resources.    ONCBCN DISTRESS SCREENING 12/22/2017  Screening Type Initial Screening  Distress experienced in past week (1-10) 7  Practical problem type Work/school  Emotional problem type Nervousness/Anxiety;Adjusting to illness  Physical Problem type Sleep/insomnia  Other [phone], cell    Clinical Social Worker follow up needed: No.  If yes, follow up plan:  Beverely Pace, [place], LCSW Clinical Social Worker Phone:  [phone]

## 2017-12-25 ENCOUNTER — Other Ambulatory Visit: Payer: Self-pay | Admitting: General Surgery

## 2017-12-25 DIAGNOSIS — C779 Secondary and unspecified malignant neoplasm of lymph node, unspecified: Principal | ICD-10-CM

## 2017-12-25 DIAGNOSIS — C50911 Malignant neoplasm of unspecified site of right female breast: Secondary | ICD-10-CM

## 2018-01-04 ENCOUNTER — Other Ambulatory Visit: Payer: BLUE CROSS/BLUE SHIELD

## 2018-01-05 ENCOUNTER — Other Ambulatory Visit: Payer: Self-pay | Admitting: Obstetrics and Gynecology

## 2018-01-06 ENCOUNTER — Other Ambulatory Visit: Payer: BLUE CROSS/BLUE SHIELD

## 2018-01-06 ENCOUNTER — Other Ambulatory Visit: Payer: Self-pay | Admitting: Obstetrics and Gynecology

## 2018-01-10 ENCOUNTER — Ambulatory Visit
Admission: RE | Admit: 2018-01-10 | Discharge: 2018-01-10 | Disposition: A | Discharge status: Home / Self Care | Payer: BLUE CROSS/BLUE SHIELD | Source: Ambulatory Visit | Attending: General Surgery | Admitting: General Surgery

## 2018-01-10 DIAGNOSIS — C779 Secondary and unspecified malignant neoplasm of lymph node, unspecified: Principal | ICD-10-CM

## 2018-01-10 DIAGNOSIS — C50911 Malignant neoplasm of unspecified site of right female breast: Secondary | ICD-10-CM

## 2018-01-10 MED ORDER — GADOBENATE DIMEGLUMINE 529 MG/ML IV SOLN
20.0000 mL | Freq: Once | INTRAVENOUS | Status: AC | PRN
  Administered 2018-01-10: 20 mL via INTRAVENOUS

## 2018-01-12 ENCOUNTER — Telehealth: Payer: Self-pay | Admitting: *Deleted

## 2018-01-12 NOTE — Telephone Encounter (Signed)
Spoke with patient and confirmed appointment to discuss MRI results for 01/14/18 at 1pm.

## 2018-01-13 ENCOUNTER — Telehealth: Payer: Self-pay | Admitting: Genetics

## 2018-01-13 NOTE — Telephone Encounter (Signed)
Revealed negative genetic testing.   This normal result is reassuring and indicates that it is unlikely Charlene Perry's cancer is due to a hereditary cause.  It is unlikely that there is an increased risk of another cancer due to a mutation in one of these genes.  However, genetic testing is not perfect, and cannot definitively rule out a hereditary cause.  It will be important for her to keep in contact with genetics to learn if any additional testing may be needed in the future.     Reccommended her siblings/maternal relatives (especially those affected with cancer also have genetic testing)

## 2018-01-14 ENCOUNTER — Ambulatory Visit: Payer: Self-pay | Admitting: Genetics

## 2018-01-14 ENCOUNTER — Encounter: Payer: Self-pay | Admitting: Genetics

## 2018-01-14 ENCOUNTER — Inpatient Hospital Stay: Payer: BLUE CROSS/BLUE SHIELD | Attending: Hematology and Oncology | Admitting: Hematology and Oncology

## 2018-01-14 DIAGNOSIS — Z800 Family history of malignant neoplasm of digestive organs: Secondary | ICD-10-CM

## 2018-01-14 DIAGNOSIS — C773 Secondary and unspecified malignant neoplasm of axilla and upper limb lymph nodes: Secondary | ICD-10-CM | POA: Insufficient documentation

## 2018-01-14 DIAGNOSIS — Z79899 Other long term (current) drug therapy: Secondary | ICD-10-CM | POA: Insufficient documentation

## 2018-01-14 DIAGNOSIS — Z7984 Long term (current) use of oral hypoglycemic drugs: Secondary | ICD-10-CM | POA: Diagnosis not present

## 2018-01-14 DIAGNOSIS — E119 Type 2 diabetes mellitus without complications: Secondary | ICD-10-CM | POA: Diagnosis not present

## 2018-01-14 DIAGNOSIS — Z7982 Long term (current) use of aspirin: Secondary | ICD-10-CM | POA: Insufficient documentation

## 2018-01-14 DIAGNOSIS — Z170 Estrogen receptor positive status [ER+]: Secondary | ICD-10-CM

## 2018-01-14 DIAGNOSIS — C50411 Malignant neoplasm of upper-outer quadrant of right female breast: Secondary | ICD-10-CM | POA: Diagnosis present

## 2018-01-14 DIAGNOSIS — Z1379 Encounter for other screening for genetic and chromosomal anomalies: Secondary | ICD-10-CM

## 2018-01-14 DIAGNOSIS — Z803 Family history of malignant neoplasm of breast: Secondary | ICD-10-CM

## 2018-01-14 NOTE — Assessment & Plan Note (Signed)
11/12/2017:Palpable lesion in the right breast upper outer quadrant mammogram and ultrasound revealed 1.4 cm tumor ultrasound-guided biopsy revealed grade 2 IDC ER 3+, PR 3+, HER-2 negative with a Ki-67 of 10%.  Right axillary lymph node biopsy positive, T1cN1 stage I a clinical stage   Recommendations: 1. Breast conserving surgery followed by 2. Mammaprint testing to determine if chemotherapy would be of any benefit followed by 3. Adjuvant radiation therapy followed by 4. Adjuvant antiestrogen therapy ------------------------------------------------------------------------------- Breast MRI 01/11/2018:Biopsy-proven malignancy UOQ right breast measuring 6 cm.  Anterior inferior to this is a 1 cm mass which needs biopsy.  Focal non-mass enhancement LOQ retroareolar right breast, left breast 7 mm irregular enhancement, 2 suspicious right axillary lymph nodes  Recommendation: Biopsy of the additional masses in the right breast as well as left breast.  Return to clinic after surgery

## 2018-01-14 NOTE — Progress Notes (Signed)
HPI:  Charlene Perry was previously seen in the [facility] clinic on 12/22/2017 due to a personal and family history of cancer and concerns regarding a hereditary predisposition to cancer. Please refer to our prior cancer genetics clinic note for more information regarding Charlene Perry's medical, social and family histories, and our assessment and recommendations, at the time. Charlene Perry recent genetic test results were disclosed to her, as well as recommendations warranted by these results. These results and recommendations are discussed in more detail below.  CANCER HISTORY:    Malignant neoplasm of upper-outer quadrant of right breast in female, estrogen receptor positive ([place])   11/12/2017 Initial Diagnosis    Palpable lesion in the right breast upper outer quadrant mammogram and ultrasound revealed 1.4 cm tumor ultrasound-guided biopsy revealed grade 2 IDC ER 3+, PR 3+, HER-2 negative with a Ki-67 of 10%.  Right axillary lymph node biopsy positive, T1cN1 stage I a clinical stage      12/23/2017 Cancer Staging    Staging form: Breast, AJCC 8th Edition - Clinical: Stage IB (cT1c, cN1, cM0, G2, ER+, PR+, HER2-) - Signed by Gardenia Phlegm, NP on 12/23/2017      01/10/2018 Breast MRI    Biopsy-proven malignancy UOQ right breast measuring 6 cm.  Anterior inferior to this is a 1 cm mass which needs biopsy.  Focal non-mass enhancement LOQ retroareolar right breast, left breast 7 mm irregular enhancement, 2 suspicious right axillary lymph nodes        FAMILY HISTORY:  We obtained a detailed, 4-generation family history.  Significant diagnoses are listed below: Family History  Problem Relation Age of Onset  . Colon cancer Mother 11  . Colon cancer Sister 65  . Colon cancer Brother        ex early 62's  . Breast cancer Other        12's    Charlene Perry has a 48 year-old son with no history of cancer.  Charlene Perry has an 61 month old  granddaughter. Charlene Perry has 2 sisters and 7 brothers; -1 brother has no history of cancer.  He has a daughter who had breast cancer I her 93's. -1 brother was dx with colon cancer in his early 38's, he had a history of smoking -5 brothers with no history of cancer.  -1 sister was diagnosed with colon cancer I her 42's and is now 26.  She has a hx of smoking.  -1 sister with no history of cancer.   Charlene Perry father: died at 64 with no history of cancer.  He had his "prostate trimmed" but no cancer.  Paternal Aunts/Uncles: many aunts/uncles, only was is known and he is deceased with no known history of cancer.  Paternal cousins: no history of cancer.  Paternal grandfather: unk Paternal grandmother:unk  Charlene Perry mother: died at 31.  She had a history of colon cancer dx at 50.  She used to smoke 'snuff' Maternal Aunts/Uncles: 4 maternal aunts, 1 maternal half-uncle- no known history of cancer.  Maternal cousins: no known history of cancer.  Maternal grandfather: unk Maternal grandmother:unk  Charlene Perry is unaware of previous family history of genetic testing for hereditary cancer risks. Patient's maternal ancestors are of African American descent, and paternal ancestors are of African American/Caucasian descent. There is no reported Ashkenazi Jewish ancestry. There is no known consanguinity.  GENETIC TEST RESULTS: Genetic testing performed through [facility]'s Common Hereditary Cancers Panel reported out on 01/13/2018 showed no pathogenic mutations. The  Common Hereditary Cancer Panel offered by [facility] includes sequencing and/or deletion duplication testing of the following 47 genes: APC, ATM, AXIN2, BARD1, BMPR1A, BRCA1, BRCA2, BRIP1, CDH1, CDKN2A (p14ARF), CDKN2A (p16INK4a), CKD4, CHEK2, CTNNA1, DICER1, EPCAM (Deletion/duplication testing only), GREM1 (promoter region deletion/duplication testing only), KIT, MEN1, MLH1, MSH2, MSH3, MSH6, MUTYH, NBN, NF1, NHTL1,  PALB2, PDGFRA, PMS2, POLD1, POLE, PTEN, RAD50, RAD51C, RAD51D, SDHB, SDHC, SDHD, SMAD4, SMARCA4. STK11, TP53, TSC1, TSC2, and VHL.  The following genes were evaluated for sequence changes only: SDHA and HOXB13 c.251G>A variant only..  The test report will be scanned into EPIC and will be located under the Molecular Pathology section of the Results Review tab. A portion of the result report is included below for reference.     We discussed with Charlene Perry that because current genetic testing is not perfect, it is possible there may be a gene mutation in one of these genes that current testing cannot detect, but that chance is small.  We also discussed, that there could be another gene that has not yet been discovered, or that we have not yet tested, that is responsible for the cancer diagnoses in the family. It is also possible there is a hereditary cause for the cancer in the family that Charlene Perry did not inherit and therefore was not identified in her testing.  Therefore, it is important to remain in touch with cancer genetics in the future so that we can continue to offer Charlene Perry the most up to date genetic testing.   ADDITIONAL GENETIC TESTING: We discussed with Charlene Perry that there are other genes that are associated with increased cancer risk that can be analyzed. The laboratories that offer this testing look at these additional genes via a hereditary cancer gene panel. Should Charlene Perry wish to pursue additional genetic testing, we are happy to discuss and coordinate this testing, at any time.   CANCER SCREENING RECOMMENDATIONS: This negative result means that we were unable to identify a hereditary cause for her personal and family history of cancer at this time.    While reassuring, this does not definitively rule out a hereditary predisposition to cancer. It is still possible that there could be genetic mutations that are undetectable by current  technology, or genetic mutations in genes that have not been tested or identified to increase cancer risk.  Therefore, it is recommended she continue to follow the cancer management and screening guidelines provided by her oncology and primary healthcare provider. An individual's cancer risk is not determined by genetic test results alone.  Overall cancer risk assessment includes additional factors such as personal medical history, family history, etc.  These should be used to make a personalized plan for cancer prevention and surveillance.    RECOMMENDATIONS FOR FAMILY MEMBERS:  Relatives in this family might be at some increased risk of developing cancer, over the general population risk, simply due to the family history of cancer.  We recommended women in this family have a yearly mammogram beginning at age 36, or 53 years younger than the earliest onset of cancer, an annual clinical breast exam, and perform monthly breast self-exams. Women in this family should also have a gynecological exam as recommended by their primary provider. All family members should have a colonoscopy by age 44 (or as directed by their doctors).  All family members should inform their physicians about the family history of cancer so their doctors can make the most appropriate screening recommendations for them.   It is also  possible there is a hereditary cause for the cancer in Charlene Perry's family that she did not inherit and therefore was not identified in her.  Therfefore, we recommended her other relatives (especially those affected with cancer) also have genetic counseling and testing. Charlene Perry will let us know if we can be of any assistance in coordinating genetic counseling and/or testing for these family members.   FOLLOW-UP: Lastly, we discussed with Charlene Perry that cancer genetics is a rapidly advancing field and it is possible that new genetic tests will be appropriate for her and/or her family  members in the future. We encouraged her to remain in contact with cancer genetics on an annual basis so we can update her personal and family histories and let her know of advances in cancer genetics that may benefit this family.   Our contact number was provided. Charlene Perry questions were answered to her satisfaction, and she knows she is welcome to call us at anytime with additional questions or concerns.   Ferol Luz, MS, [facility] Certified Genetic Counselor [email or web address]

## 2018-01-15 ENCOUNTER — Other Ambulatory Visit: Payer: Self-pay | Admitting: *Deleted

## 2018-01-15 ENCOUNTER — Telehealth: Payer: Self-pay | Admitting: *Deleted

## 2018-01-15 DIAGNOSIS — Z170 Estrogen receptor positive status [ER+]: Principal | ICD-10-CM

## 2018-01-15 DIAGNOSIS — C50411 Malignant neoplasm of upper-outer quadrant of right female breast: Secondary | ICD-10-CM

## 2018-01-15 NOTE — Telephone Encounter (Signed)
Spoke with patient to confirm appointment for U/S bx 8/8.  Offered appt for 8/6 but patient could not that day.

## 2018-01-15 NOTE — Progress Notes (Signed)
Patient Care Team: [name], MD as PCP - General (Family Medicine)  DIAGNOSIS:  Encounter Diagnosis  Name Primary?  . Malignant neoplasm of upper-outer quadrant of right breast in female, estrogen receptor positive ([place])     SUMMARY OF ONCOLOGIC HISTORY:   Malignant neoplasm of upper-outer quadrant of right breast in female, estrogen receptor positive ([place])   11/12/2017 Initial Diagnosis    Palpable lesion in the right breast upper outer quadrant mammogram and ultrasound revealed 1.4 cm tumor ultrasound-guided biopsy revealed grade 2 IDC ER 3+, PR 3+, HER-2 negative with a Ki-67 of 10%.  Right axillary lymph node biopsy positive, T1cN1 stage I a clinical stage      12/23/2017 Cancer Staging    Staging form: Breast, AJCC 8th Edition - Clinical: Stage IB (cT1c, cN1, cM0, G2, ER+, PR+, HER2-) - Signed by Gardenia Phlegm, NP on 12/23/2017      01/10/2018 Breast MRI    Biopsy-proven malignancy UOQ right breast measuring 6 cm.  Anterior inferior to this is a 1 cm mass which needs biopsy.  Focal non-mass enhancement LOQ retroareolar right breast, left breast 7 mm irregular enhancement, 2 suspicious right axillary lymph nodes       CHIEF COMPLIANT: Follow-up to discuss a breast MRI  INTERVAL HISTORY: Charlene Perry is a 52-year with above-mentioned history of right breast cancer who underwent breast MRI and is here today to discuss results.  There were several new lesions noted on MRI and she is accompanied by her family to give her support.  She was startled extremely upset to hear that there were additional nodules in the breast.  But her husband was able to console her and comfort her.  REVIEW OF SYSTEMS:   Constitutional: Denies fevers, chills or abnormal weight loss Eyes: Denies blurriness of vision Ears, nose, mouth, throat, and face: Denies mucositis or sore throat Respiratory: Denies cough, dyspnea or wheezes Cardiovascular: Denies palpitation,  chest discomfort Gastrointestinal:  Denies nausea, heartburn or change in bowel habits Skin: Denies abnormal skin rashes Lymphatics: Denies new lymphadenopathy or easy bruising Neurological:Denies numbness, tingling or new weaknesses Behavioral/Psych: Very emotional hearing the results Extremities: No lower extremity edema   All other systems were reviewed with the patient and are negative.  I have reviewed the past medical history, past surgical history, social history and family history with the patient and they are unchanged from previous note.  ALLERGIES:  has No Known Allergies.  MEDICATIONS:  Current Outpatient Medications  Medication Sig Dispense Refill  . aspirin 81 MG chewable tablet 1 PO DAILY    . benzonatate (TESSALON PERLES) 100 MG capsule Tessalon Perles 100 mg capsule  Take 1 capsule 3 times a day by oral route as needed.    . celecoxib (CELEBREX) 200 MG capsule Take 200 mg by mouth daily.    . furosemide (LASIX) 80 MG tablet Take 80 mg by mouth daily.    Marland Kitchen gabapentin (NEURONTIN) 400 MG capsule Take 400 mg by mouth 2 (two) times daily.    Marland Kitchen levothyroxine (SYNTHROID, LEVOTHROID) 125 MCG tablet Take 125 mcg by mouth daily before breakfast.    . metFORMIN (GLUCOPHAGE) 1000 MG tablet Take 1,000 mg by mouth 2 (two) times daily with a meal.    . potassium chloride (K-DUR,KLOR-CON) 10 MEQ tablet Take 10 mEq by mouth 2 (two) times daily.    . rosuvastatin (CRESTOR) 40 MG tablet Take 40 mg by mouth daily.     No current facility-administered medications for this  visit.     PHYSICAL EXAMINATION: ECOG PERFORMANCE STATUS: 1 - Symptomatic but completely ambulatory  Vitals:   01/14/18 1319  BP: (!) 149/75  Pulse: (!) 103  Resp: 18  Temp: 97.8 F (36.6 C)  SpO2: 97%   Filed Weights   01/14/18 1319  Weight: 220 lb 6.4 oz (100 kg)    GENERAL:alert, no distress and comfortable SKIN: skin color, texture, turgor are normal, no rashes or significant lesions EYES: normal,  Conjunctiva are pink and non-injected, sclera clear OROPHARYNX:no exudate, no erythema and lips, buccal mucosa, and tongue normal  NECK: supple, thyroid normal size, non-tender, without nodularity LYMPH:  no palpable lymphadenopathy in the cervical, axillary or inguinal LUNGS: clear to auscultation and percussion with normal breathing effort HEART: regular rate & rhythm and no murmurs and no lower extremity edema ABDOMEN:abdomen soft, non-tender and normal bowel sounds MUSCULOSKELETAL:no cyanosis of digits and no clubbing  NEURO: alert & oriented x 3 with fluent speech, no focal motor/sensory deficits EXTREMITIES: No lower extremity edema    ASSESSMENT & PLAN:  Malignant neoplasm of upper-outer quadrant of right breast in female, estrogen receptor positive ([facility]) 11/12/2017:Palpable lesion in the right breast upper outer quadrant mammogram and ultrasound revealed 1.4 cm tumor ultrasound-guided biopsy revealed grade 2 IDC ER 3+, PR 3+, HER-2 negative with a Ki-67 of 10%.  Right axillary lymph node biopsy positive, T1cN1 stage I a clinical stage  Recommendations: 1. Breast conserving surgery followed by 2. Mammaprint testing to determine if chemotherapy would be of any benefit followed by 3. Adjuvant radiation therapy followed by 4. Adjuvant antiestrogen therapy ------------------------------------------------------------------------------- Breast MRI 01/11/2018:Biopsy-proven malignancy UOQ right breast measuring 6 cm.  Anterior inferior to this is a 1 cm mass which needs biopsy.  Focal non-mass enhancement LOQ retroareolar right breast, left breast 7 mm irregular enhancement, 2 suspicious right axillary lymph nodes  Recommendation: Biopsy of the additional masses in the right breast as well as left breast.  Patient was extremely emotional and tearful here in these results.  We were able to console her and counseled her and she was able to calm down at the end of the visit.  Patient appears to be  tolerating antiestrogen therapy extremely well with letrozole.  No orders of the defined types were placed in this encounter.  The patient has a good understanding of the overall plan. she agrees with it. she will call with any problems that may develop before the next visit here.   Harriette Ohara, MD 01/15/18

## 2018-01-19 ENCOUNTER — Other Ambulatory Visit: Payer: BLUE CROSS/BLUE SHIELD

## 2018-01-21 ENCOUNTER — Ambulatory Visit
Admission: RE | Admit: 2018-01-21 | Discharge: 2018-01-21 | Disposition: A | Discharge status: Home / Self Care | Payer: BLUE CROSS/BLUE SHIELD | Source: Ambulatory Visit | Attending: Hematology and Oncology | Admitting: Hematology and Oncology

## 2018-01-21 ENCOUNTER — Other Ambulatory Visit: Payer: Self-pay | Admitting: Hematology and Oncology

## 2018-01-21 DIAGNOSIS — C50411 Malignant neoplasm of upper-outer quadrant of right female breast: Secondary | ICD-10-CM

## 2018-01-21 DIAGNOSIS — Z170 Estrogen receptor positive status [ER+]: Principal | ICD-10-CM

## 2018-01-22 ENCOUNTER — Other Ambulatory Visit: Payer: Self-pay | Admitting: *Deleted

## 2018-01-22 ENCOUNTER — Other Ambulatory Visit: Payer: Self-pay | Admitting: Hematology and Oncology

## 2018-01-22 ENCOUNTER — Telehealth: Payer: Self-pay | Admitting: *Deleted

## 2018-01-22 DIAGNOSIS — C50411 Malignant neoplasm of upper-outer quadrant of right female breast: Secondary | ICD-10-CM

## 2018-01-22 DIAGNOSIS — Z170 Estrogen receptor positive status [ER+]: Principal | ICD-10-CM

## 2018-01-22 DIAGNOSIS — N6310 Unspecified lump in the right breast, unspecified quadrant: Secondary | ICD-10-CM

## 2018-01-22 NOTE — Telephone Encounter (Signed)
Spoke with patient regarding appointment for Left MRI guided bx. It is scheduled for 01/27/18 at 7am.  Patient aware. Informed there is some question regarding an additional mri bx on the right.  Dr. Donne Hazel will discuss with radiologist and then we will let her know.  Patient verbalized understanding.

## 2018-01-27 ENCOUNTER — Ambulatory Visit
Admission: RE | Admit: 2018-01-27 | Discharge: 2018-01-27 | Disposition: A | Discharge status: Home / Self Care | Payer: BLUE CROSS/BLUE SHIELD | Source: Ambulatory Visit | Attending: Hematology and Oncology | Admitting: Hematology and Oncology

## 2018-01-27 ENCOUNTER — Other Ambulatory Visit: Payer: Self-pay | Admitting: Hematology and Oncology

## 2018-01-27 DIAGNOSIS — N6310 Unspecified lump in the right breast, unspecified quadrant: Secondary | ICD-10-CM

## 2018-01-27 DIAGNOSIS — Z170 Estrogen receptor positive status [ER+]: Principal | ICD-10-CM

## 2018-01-27 DIAGNOSIS — C50411 Malignant neoplasm of upper-outer quadrant of right female breast: Secondary | ICD-10-CM

## 2018-01-27 MED ORDER — GADOBENATE DIMEGLUMINE 529 MG/ML IV SOLN
20.0000 mL | Freq: Once | INTRAVENOUS | Status: AC | PRN
  Administered 2018-01-27: 20 mL via INTRAVENOUS

## 2018-02-03 ENCOUNTER — Other Ambulatory Visit: Payer: Self-pay | Admitting: Obstetrics and Gynecology

## 2018-02-03 DIAGNOSIS — R229 Localized swelling, mass and lump, unspecified: Principal | ICD-10-CM

## 2018-02-03 DIAGNOSIS — IMO0002 Reserved for concepts with insufficient information to code with codable children: Secondary | ICD-10-CM

## 2018-02-10 ENCOUNTER — Ambulatory Visit
Admission: RE | Admit: 2018-02-10 | Discharge: 2018-02-10 | Disposition: A | Discharge status: Home / Self Care | Payer: BLUE CROSS/BLUE SHIELD | Source: Ambulatory Visit | Attending: Obstetrics and Gynecology | Admitting: Obstetrics and Gynecology

## 2018-02-10 DIAGNOSIS — R229 Localized swelling, mass and lump, unspecified: Principal | ICD-10-CM

## 2018-02-10 DIAGNOSIS — IMO0002 Reserved for concepts with insufficient information to code with codable children: Secondary | ICD-10-CM

## 2018-02-10 MED ORDER — IOPAMIDOL (ISOVUE-300) INJECTION 61%
100.0000 mL | Freq: Once | INTRAVENOUS | Status: AC | PRN
  Administered 2018-02-10: 100 mL via INTRAVENOUS

## 2018-03-02 ENCOUNTER — Other Ambulatory Visit: Payer: Self-pay | Admitting: General Surgery

## 2018-03-02 DIAGNOSIS — E279 Disorder of adrenal gland, unspecified: Principal | ICD-10-CM

## 2018-03-02 DIAGNOSIS — E278 Other specified disorders of adrenal gland: Secondary | ICD-10-CM

## 2018-03-08 ENCOUNTER — Other Ambulatory Visit: Payer: BLUE CROSS/BLUE SHIELD

## 2018-03-15 ENCOUNTER — Other Ambulatory Visit: Payer: BLUE CROSS/BLUE SHIELD

## 2018-03-15 ENCOUNTER — Ambulatory Visit
Admission: RE | Admit: 2018-03-15 | Discharge: 2018-03-15 | Disposition: A | Discharge status: Home / Self Care | Payer: BLUE CROSS/BLUE SHIELD | Source: Ambulatory Visit | Attending: General Surgery | Admitting: General Surgery

## 2018-03-15 DIAGNOSIS — E278 Other specified disorders of adrenal gland: Secondary | ICD-10-CM

## 2018-03-15 DIAGNOSIS — E279 Disorder of adrenal gland, unspecified: Principal | ICD-10-CM

## 2018-03-19 ENCOUNTER — Other Ambulatory Visit: Payer: Self-pay | Admitting: General Surgery

## 2018-03-19 DIAGNOSIS — Z170 Estrogen receptor positive status [ER+]: Principal | ICD-10-CM

## 2018-03-19 DIAGNOSIS — C50411 Malignant neoplasm of upper-outer quadrant of right female breast: Secondary | ICD-10-CM

## 2018-04-14 ENCOUNTER — Other Ambulatory Visit: Payer: Self-pay | Admitting: General Surgery

## 2018-04-14 DIAGNOSIS — C50411 Malignant neoplasm of upper-outer quadrant of right female breast: Secondary | ICD-10-CM

## 2018-04-14 DIAGNOSIS — Z170 Estrogen receptor positive status [ER+]: Principal | ICD-10-CM

## 2018-04-26 ENCOUNTER — Telehealth: Payer: Self-pay | Admitting: Hematology and Oncology

## 2018-04-26 NOTE — Telephone Encounter (Signed)
Called patient to schedule post op appt with Dr. Lindi Adie.  Patient wanted to do it on the 14th because she is also seeing the surgeon that day and travels from far away to get here.

## 2018-04-29 ENCOUNTER — Encounter (INDEPENDENT_AMBULATORY_CARE_PROVIDER_SITE_OTHER): Payer: BLUE CROSS/BLUE SHIELD | Admitting: Ophthalmology

## 2018-06-02 ENCOUNTER — Other Ambulatory Visit: Payer: Self-pay | Admitting: General Surgery

## 2018-06-02 DIAGNOSIS — Z170 Estrogen receptor positive status [ER+]: Principal | ICD-10-CM

## 2018-06-02 DIAGNOSIS — C50411 Malignant neoplasm of upper-outer quadrant of right female breast: Secondary | ICD-10-CM

## 2018-06-07 ENCOUNTER — Encounter (INDEPENDENT_AMBULATORY_CARE_PROVIDER_SITE_OTHER): Payer: Self-pay | Admitting: Ophthalmology

## 2018-06-11 NOTE — Pre-Procedure Instructions (Addendum)
[facility]ari[facility] Mcmackin [facility]  06/11/2018      [facility] [place], [place] [address] [place] [address] Phone: [phone] Fax: [phone]    Your procedure is scheduled on January 6  Report to [facility] at [email or web address].  Call this number if you have problems the morning of surgery:  [phone]   Remember:  Do not drink after midnight.  Can have clear liquids until 0430 am the morning of surgery.  Clear Liquids include: water, black coffee only, and  Gatorade Please drink the pre-surgery ensure the morning of surgery at 0430 am     Take these medicines the morning of surgery with A SIP OF WATER  cetirizine (ZYRTEC) gabapentin (NEURONTIN) levothyroxine (SYNTHROID, LEVOTHROID)   7 days prior to surgery STOP taking any Aspirin (unless otherwise instructed by your surgeon), Aleve, Naproxen, Ibuprofen, Motrin, Advil, Goody's, BC's, all herbal medications, fish oil, and all vitamins. celecoxib (CELEBREX   WHAT DO I DO ABOUT MY DIABETES MEDICATION?   Marland Kitchen Do not take oral diabetes medicines (pills) the morning of surgery. metFORMIN (GLUCOPHAGE)   THow to Manage Your Diabetes Before and After Surgery  Why is it important to control my blood sugar before and after surgery? . Improving blood sugar levels before and after surgery helps healing and can limit problems. . A way of improving blood sugar control is eating a healthy diet by: o  Eating less sugar and carbohydrates o  Increasing activity/exercise o  Talking with your doctor about reaching your blood sugar goals . High blood sugars (greater than 180 mg/dL) can raise your risk of infections and slow your recovery, so you will need to focus on controlling your diabetes during the weeks before surgery. . Make sure that the doctor who takes care of your diabetes knows about your planned surgery including the date and location.  How do I manage my blood  sugar before surgery? . Check your blood sugar at least 4 times a day, starting 2 days before surgery, to make sure that the level is not too high or low. o Check your blood sugar the morning of your surgery when you wake up and every 2 hours until you get to the Short Stay unit. . If your blood sugar is less than 70 mg/dL, you will need to treat for low blood sugar: o Do not take insulin. o Treat a low blood sugar (less than 70 mg/dL) with  cup of clear juice (cranberry or apple), 4 glucose tablets, OR glucose gel. o Recheck blood sugar in 15 minutes after treatment (to make sure it is greater than 70 mg/dL). If your blood sugar is not greater than 70 mg/dL on recheck, call [phone] for further instructions. . Report your blood sugar to the short stay nurse when you get to Short Stay.  . If you are admitted to the hospital after surgery: o Your blood sugar will be checked by the staff and you will probably be given insulin after surgery (instead of oral diabetes medicines) to make sure you have good blood sugar levels. o The goal for blood sugar control after surgery is 80-180 mg/dL.    Do not wear jewelry, make-up or nail polish.  Do not wear lotions, powders, or perfumes, or deodorant.  Do not shave 48 hours prior to surgery.   Do not bring valuables to the hospital.  [facility] is not responsible for any belongings or valuables.  Contacts,  dentures or bridgework may not be worn into surgery.  Leave your suitcase in the car.  After surgery it may be brought to your room.  For patients admitted to the hospital, discharge time will be determined by your treatment team.  Patients discharged the day of surgery will not be allowed to drive home.    Special instructions:   - Preparing For Surgery  Before surgery, you can play an important role. Because skin is not sterile, your skin needs to be as free of germs as possible. You can reduce the number of germs on your skin by  washing with CHG (chlorahexidine gluconate) Soap before surgery.  CHG is an antiseptic cleaner which kills germs and bonds with the skin to continue killing germs even after washing.    Oral Hygiene is also important to reduce your risk of infection.  Remember - BRUSH YOUR TEETH THE MORNING OF SURGERY WITH YOUR REGULAR TOOTHPASTE  Please do not use if you have an allergy to CHG or antibacterial soaps. If your skin becomes reddened/irritated stop using the CHG.  Do not shave (including legs and underarms) for at least 48 hours prior to first CHG shower. It is OK to shave your face.  Please follow these instructions carefully.   1. Shower the NIGHT BEFORE SURGERY and the MORNING OF SURGERY with CHG.   2. If you chose to wash your hair, wash your hair first as usual with your normal shampoo.  3. After you shampoo, rinse your hair and body thoroughly to remove the shampoo.  4. Use CHG as you would any other liquid soap. You can apply CHG directly to the skin and wash gently with a scrungie or a clean washcloth.   5. Apply the CHG Soap to your body ONLY FROM THE NECK DOWN.  Do not use on open wounds or open sores. Avoid contact with your eyes, ears, mouth and genitals (private parts). Wash Face and genitals (private parts)  with your normal soap.  6. Wash thoroughly, paying special attention to the area where your surgery will be performed.  7. Thoroughly rinse your body with warm water from the neck down.  8. DO NOT shower/wash with your normal soap after using and rinsing off the CHG Soap.  9. Pat yourself dry with a CLEAN TOWEL.  10. Wear CLEAN PAJAMAS to bed the night before surgery, wear comfortable clothes the morning of surgery  11. Place CLEAN SHEETS on your bed the night of your first shower and DO NOT SLEEP WITH PETS.    Day of Surgery:  Do not apply any deodorants/lotions.  Please wear clean clothes to the hospital/surgery center.   Remember to brush your teeth WITH YOUR  REGULAR TOOTHPASTE.    Please read over the following fact sheets that you were given.

## 2018-06-14 ENCOUNTER — Other Ambulatory Visit: Payer: Self-pay

## 2018-06-14 ENCOUNTER — Encounter (HOSPITAL_COMMUNITY): Payer: Self-pay | Admitting: *Deleted

## 2018-06-14 ENCOUNTER — Encounter (HOSPITAL_COMMUNITY)
Admission: RE | Admit: 2018-06-14 | Discharge: 2018-06-14 | Disposition: A | Discharge status: Home / Self Care | Payer: BLUE CROSS/BLUE SHIELD | Source: Ambulatory Visit | Attending: General Surgery | Admitting: General Surgery

## 2018-06-14 DIAGNOSIS — Z01818 Encounter for other preprocedural examination: Secondary | ICD-10-CM | POA: Diagnosis present

## 2018-06-14 HISTORY — DX: Malignant (primary) neoplasm, unspecified: C80.1

## 2018-06-14 HISTORY — DX: Sleep apnea, unspecified: G47.30

## 2018-06-14 HISTORY — DX: Type 2 diabetes mellitus without complications: E11.9

## 2018-06-14 HISTORY — DX: Unspecified osteoarthritis, unspecified site: M19.90

## 2018-06-14 HISTORY — DX: Essential (primary) hypertension: I10

## 2018-06-14 HISTORY — DX: Hypothyroidism, unspecified: E03.9

## 2018-06-14 LAB — GLUCOSE, CAPILLARY: Glucose-Capillary: 199 mg/dL — ABNORMAL HIGH (ref 70–99)

## 2018-06-14 LAB — BASIC METABOLIC PANEL
Anion gap: 11 (ref 5–15)
BUN: 16 mg/dL (ref 8–23)
CO2: 26 mmol/L (ref 22–32)
Calcium: 9 mg/dL (ref 8.9–10.3)
Chloride: 104 mmol/L (ref 98–111)
Creatinine, Ser: 1.27 mg/dL — ABNORMAL HIGH (ref 0.44–1.00)
GFR calc Af Amer: 51 mL/min — ABNORMAL LOW (ref >60)
GFR calc non Af Amer: 44 mL/min — ABNORMAL LOW (ref >60)
Glucose, Bld: 173 mg/dL — ABNORMAL HIGH (ref 70–99)
POTASSIUM: 3.6 mmol/L (ref 3.5–5.1)
Sodium: 141 mmol/L (ref 135–145)

## 2018-06-14 LAB — CBC
HCT: 36.1 % (ref 36.0–46.0)
Hemoglobin: 11.3 g/dL — ABNORMAL LOW (ref 12.0–15.0)
MCH: 27.8 pg (ref 26.0–34.0)
MCHC: 31.3 g/dL (ref 30.0–36.0)
MCV: 88.9 fL (ref 80.0–100.0)
Platelets: 228 10*3/uL (ref 150–400)
RBC: 4.06 MIL/uL (ref 3.87–5.11)
RDW: 13.7 % (ref 11.5–15.5)
WBC: 6.9 10*3/uL (ref 4.0–10.5)
nRBC: 0 % (ref 0.0–0.2)

## 2018-06-14 LAB — HEMOGLOBIN A1C
Hgb A1c MFr Bld: 6.8 % — ABNORMAL HIGH (ref 4.8–5.6)
Mean Plasma Glucose: 148.46 mg/dL

## 2018-06-14 NOTE — Progress Notes (Signed)
PCP is Dr. Maryln Gottron   LOV 09/2017 She also manages her DM Patient checks her sugar Bid   Ranges from 89-199  Last A!C was done 4-5 mths ago. Denies murmur, cp, sob.  No cardiac testing done. Does have osa - dx 1990's

## 2018-06-18 ENCOUNTER — Ambulatory Visit
Admission: RE | Admit: 2018-06-18 | Discharge: 2018-06-18 | Disposition: A | Discharge status: Home / Self Care | Payer: BLUE CROSS/BLUE SHIELD | Source: Ambulatory Visit | Attending: General Surgery | Admitting: General Surgery

## 2018-06-18 ENCOUNTER — Other Ambulatory Visit: Payer: Self-pay | Admitting: General Surgery

## 2018-06-18 DIAGNOSIS — Z170 Estrogen receptor positive status [ER+]: Principal | ICD-10-CM

## 2018-06-18 DIAGNOSIS — C50411 Malignant neoplasm of upper-outer quadrant of right female breast: Secondary | ICD-10-CM

## 2018-06-21 ENCOUNTER — Other Ambulatory Visit: Payer: Self-pay

## 2018-06-21 ENCOUNTER — Ambulatory Visit (HOSPITAL_COMMUNITY): Payer: BLUE CROSS/BLUE SHIELD | Admitting: Certified Registered Nurse Anesthetist

## 2018-06-21 ENCOUNTER — Ambulatory Visit (HOSPITAL_COMMUNITY): Payer: BLUE CROSS/BLUE SHIELD | Admitting: Physician Assistant

## 2018-06-21 ENCOUNTER — Observation Stay (HOSPITAL_COMMUNITY)
Admission: RE | Admit: 2018-06-21 | Discharge: 2018-06-23 | Disposition: A | Discharge status: Home / Self Care | Payer: BLUE CROSS/BLUE SHIELD | Source: Ambulatory Visit | Attending: General Surgery | Admitting: General Surgery

## 2018-06-21 ENCOUNTER — Encounter (HOSPITAL_COMMUNITY)
Admission: RE | Disposition: A | Discharge status: Home / Self Care | Payer: Self-pay | Source: Ambulatory Visit | Attending: General Surgery

## 2018-06-21 ENCOUNTER — Ambulatory Visit (HOSPITAL_COMMUNITY)
Admission: RE | Admit: 2018-06-21 | Discharge: 2018-06-21 | Disposition: A | Discharge status: Home / Self Care | Payer: BLUE CROSS/BLUE SHIELD | Source: Ambulatory Visit | Attending: General Surgery | Admitting: General Surgery

## 2018-06-21 ENCOUNTER — Ambulatory Visit
Admission: RE | Admit: 2018-06-21 | Discharge: 2018-06-21 | Disposition: A | Discharge status: Home / Self Care | Payer: BLUE CROSS/BLUE SHIELD | Source: Ambulatory Visit | Attending: General Surgery | Admitting: General Surgery

## 2018-06-21 ENCOUNTER — Encounter (HOSPITAL_COMMUNITY): Payer: Self-pay

## 2018-06-21 DIAGNOSIS — E039 Hypothyroidism, unspecified: Secondary | ICD-10-CM | POA: Diagnosis not present

## 2018-06-21 DIAGNOSIS — Z79899 Other long term (current) drug therapy: Secondary | ICD-10-CM | POA: Diagnosis not present

## 2018-06-21 DIAGNOSIS — C50411 Malignant neoplasm of upper-outer quadrant of right female breast: Secondary | ICD-10-CM

## 2018-06-21 DIAGNOSIS — C773 Secondary and unspecified malignant neoplasm of axilla and upper limb lymph nodes: Secondary | ICD-10-CM | POA: Diagnosis not present

## 2018-06-21 DIAGNOSIS — E119 Type 2 diabetes mellitus without complications: Secondary | ICD-10-CM | POA: Insufficient documentation

## 2018-06-21 DIAGNOSIS — Z800 Family history of malignant neoplasm of digestive organs: Secondary | ICD-10-CM | POA: Insufficient documentation

## 2018-06-21 DIAGNOSIS — C50911 Malignant neoplasm of unspecified site of right female breast: Secondary | ICD-10-CM | POA: Diagnosis present

## 2018-06-21 DIAGNOSIS — G4733 Obstructive sleep apnea (adult) (pediatric): Secondary | ICD-10-CM | POA: Diagnosis not present

## 2018-06-21 DIAGNOSIS — Z170 Estrogen receptor positive status [ER+]: Secondary | ICD-10-CM | POA: Diagnosis not present

## 2018-06-21 DIAGNOSIS — Z803 Family history of malignant neoplasm of breast: Secondary | ICD-10-CM | POA: Insufficient documentation

## 2018-06-21 DIAGNOSIS — Z7984 Long term (current) use of oral hypoglycemic drugs: Secondary | ICD-10-CM | POA: Diagnosis not present

## 2018-06-21 DIAGNOSIS — Z6836 Body mass index (BMI) 36.0-36.9, adult: Secondary | ICD-10-CM | POA: Diagnosis not present

## 2018-06-21 DIAGNOSIS — I10 Essential (primary) hypertension: Secondary | ICD-10-CM | POA: Diagnosis not present

## 2018-06-21 DIAGNOSIS — F419 Anxiety disorder, unspecified: Secondary | ICD-10-CM | POA: Diagnosis not present

## 2018-06-21 DIAGNOSIS — L80 Vitiligo: Secondary | ICD-10-CM | POA: Insufficient documentation

## 2018-06-21 DIAGNOSIS — M1990 Unspecified osteoarthritis, unspecified site: Secondary | ICD-10-CM | POA: Diagnosis not present

## 2018-06-21 DIAGNOSIS — Z7982 Long term (current) use of aspirin: Secondary | ICD-10-CM | POA: Insufficient documentation

## 2018-06-21 HISTORY — DX: Malignant neoplasm of unspecified site of unspecified female breast: C50.919

## 2018-06-21 HISTORY — PX: MASTECTOMY: SHX3

## 2018-06-21 HISTORY — PX: MASTECTOMY WITH RADIOACTIVE SEED GUIDED EXCISION AND AXILLARY SENTINEL LYMPH NODE BIOPSY: SHX6736

## 2018-06-21 LAB — GLUCOSE, CAPILLARY
GLUCOSE-CAPILLARY: 118 mg/dL — AB (ref 70–99)
Glucose-Capillary: 315 mg/dL — ABNORMAL HIGH (ref 70–99)
Glucose-Capillary: 145 mg/dL — ABNORMAL HIGH (ref 70–99)
Glucose-Capillary: 151 mg/dL — ABNORMAL HIGH (ref 70–99)
Glucose-Capillary: 191 mg/dL — ABNORMAL HIGH (ref 70–99)
Glucose-Capillary: 201 mg/dL — ABNORMAL HIGH (ref 70–99)

## 2018-06-21 SURGERY — MASTECTOMY WITH RADIOACTIVE SEED GUIDED EXCISION AND AXILLARY SENTINEL LYMPH NODE BIOPSY
Anesthesia: Regional | Site: Breast | Laterality: Right

## 2018-06-21 MED ORDER — INSULIN ASPART 100 UNIT/ML ~~LOC~~ SOLN
SUBCUTANEOUS | Status: AC
  Filled 2018-06-21: qty 1

## 2018-06-21 MED ORDER — CHLORHEXIDINE GLUCONATE CLOTH 2 % EX PADS: 6.0000 | MEDICATED_PAD | Freq: Once | CUTANEOUS | Status: DC

## 2018-06-21 MED ORDER — GABAPENTIN 100 MG PO CAPS
ORAL_CAPSULE | ORAL | Status: AC
  Filled 2018-06-21: qty 1

## 2018-06-21 MED ORDER — BUPIVACAINE-EPINEPHRINE 0.25% -1:200000 IJ SOLN: INTRAMUSCULAR | Status: DC | PRN

## 2018-06-21 MED ORDER — DEXAMETHASONE SODIUM PHOSPHATE 10 MG/ML IJ SOLN
INTRAMUSCULAR | Status: AC
  Filled 2018-06-21: qty 1

## 2018-06-21 MED ORDER — FENTANYL CITRATE (PF) 100 MCG/2ML IJ SOLN
INTRAMUSCULAR | Status: DC | PRN
  Administered 2018-06-21 (×2): 50 ug via INTRAVENOUS

## 2018-06-21 MED ORDER — SODIUM CHLORIDE (PF) 0.9 % IJ SOLN
INTRAMUSCULAR | Status: AC
  Filled 2018-06-21: qty 10

## 2018-06-21 MED ORDER — MIDAZOLAM HCL 2 MG/2ML IJ SOLN
INTRAMUSCULAR | Status: DC | PRN
  Administered 2018-06-21: 2 mg via INTRAVENOUS

## 2018-06-21 MED ORDER — OXYCODONE HCL 5 MG/5ML PO SOLN: 5.0000 mg | Freq: Once | ORAL | Status: AC | PRN

## 2018-06-21 MED ORDER — ACETAMINOPHEN 500 MG PO TABS: 1000.0000 mg | ORAL_TABLET | Freq: Once | ORAL | Status: DC | PRN

## 2018-06-21 MED ORDER — PROPOFOL 500 MG/50ML IV EMUL
INTRAVENOUS | Status: DC | PRN
  Administered 2018-06-21: 125 ug/kg/min via INTRAVENOUS

## 2018-06-21 MED ORDER — LACTATED RINGERS IV SOLN
INTRAVENOUS | Status: DC | PRN
  Administered 2018-06-21: 07:00:00 via INTRAVENOUS

## 2018-06-21 MED ORDER — POTASSIUM CHLORIDE CRYS ER 20 MEQ PO TBCR
20.0000 meq | EXTENDED_RELEASE_TABLET | Freq: Every day | ORAL | Status: DC
  Administered 2018-06-22 – 2018-06-23 (×2): 20 meq via ORAL
  Filled 2018-06-21 (×3): qty 1

## 2018-06-21 MED ORDER — PROPOFOL 10 MG/ML IV BOLUS
INTRAVENOUS | Status: AC
  Filled 2018-06-21: qty 40

## 2018-06-21 MED ORDER — METHYLENE BLUE 0.5 % INJ SOLN
INTRAVENOUS | Status: AC
  Filled 2018-06-21: qty 10

## 2018-06-21 MED ORDER — FENTANYL CITRATE (PF) 100 MCG/2ML IJ SOLN
INTRAMUSCULAR | Status: AC
  Filled 2018-06-21: qty 2

## 2018-06-21 MED ORDER — SIMETHICONE 80 MG PO CHEW: 40.0000 mg | CHEWABLE_TABLET | Freq: Four times a day (QID) | ORAL | Status: DC | PRN

## 2018-06-21 MED ORDER — ONDANSETRON HCL 4 MG/2ML IJ SOLN
INTRAMUSCULAR | Status: DC | PRN
  Administered 2018-06-21: 4 mg via INTRAVENOUS

## 2018-06-21 MED ORDER — BUPIVACAINE-EPINEPHRINE (PF) 0.25% -1:200000 IJ SOLN
INTRAMUSCULAR | Status: AC
  Filled 2018-06-21: qty 30

## 2018-06-21 MED ORDER — OXYCODONE HCL 5 MG PO TABS
ORAL_TABLET | ORAL | Status: AC
  Filled 2018-06-21: qty 1

## 2018-06-21 MED ORDER — CELECOXIB 200 MG PO CAPS
200.0000 mg | ORAL_CAPSULE | Freq: Every day | ORAL | Status: DC
  Administered 2018-06-22 – 2018-06-23 (×2): 200 mg via ORAL
  Filled 2018-06-21 (×3): qty 1

## 2018-06-21 MED ORDER — KETOROLAC TROMETHAMINE 0.5 % OP SOLN
1.0000 [drp] | Freq: Three times a day (TID) | OPHTHALMIC | Status: AC | PRN
  Administered 2018-06-21: 1 [drp] via OPHTHALMIC
  Filled 2018-06-21: qty 5

## 2018-06-21 MED ORDER — OXYCODONE HCL 5 MG PO TABS
5.0000 mg | ORAL_TABLET | Freq: Once | ORAL | Status: AC | PRN
  Administered 2018-06-21: 5 mg via ORAL

## 2018-06-21 MED ORDER — FUROSEMIDE 80 MG PO TABS
80.0000 mg | ORAL_TABLET | Freq: Every day | ORAL | Status: DC
  Administered 2018-06-22 – 2018-06-23 (×2): 80 mg via ORAL
  Filled 2018-06-21 (×2): qty 1

## 2018-06-21 MED ORDER — DEXAMETHASONE SODIUM PHOSPHATE 10 MG/ML IJ SOLN
INTRAMUSCULAR | Status: DC | PRN
  Administered 2018-06-21: 5 mg via INTRAVENOUS

## 2018-06-21 MED ORDER — INSULIN ASPART 100 UNIT/ML ~~LOC~~ SOLN
SUBCUTANEOUS | Status: DC | PRN
  Administered 2018-06-21: 10 [IU] via INTRAVENOUS

## 2018-06-21 MED ORDER — MORPHINE SULFATE (PF) 2 MG/ML IV SOLN
1.0000 mg | INTRAVENOUS | Status: DC | PRN
  Administered 2018-06-21: 1 mg via INTRAVENOUS

## 2018-06-21 MED ORDER — TECHNETIUM TC 99M SULFUR COLLOID FILTERED
1.0000 | Freq: Once | INTRAVENOUS | Status: AC | PRN
  Administered 2018-06-21: 1 via INTRADERMAL

## 2018-06-21 MED ORDER — LEVOTHYROXINE SODIUM 25 MCG PO TABS
125.0000 ug | ORAL_TABLET | Freq: Every day | ORAL | Status: DC
  Administered 2018-06-22 – 2018-06-23 (×2): 125 ug via ORAL
  Filled 2018-06-21 (×2): qty 1

## 2018-06-21 MED ORDER — ENSURE PRE-SURGERY PO LIQD
296.0000 mL | Freq: Once | ORAL | Status: DC
  Filled 2018-06-21: qty 296

## 2018-06-21 MED ORDER — PHENYLEPHRINE 40 MCG/ML (10ML) SYRINGE FOR IV PUSH (FOR BLOOD PRESSURE SUPPORT)
PREFILLED_SYRINGE | INTRAVENOUS | Status: DC | PRN
  Administered 2018-06-21: 120 ug via INTRAVENOUS
  Administered 2018-06-21 (×3): 80 ug via INTRAVENOUS

## 2018-06-21 MED ORDER — ONDANSETRON 4 MG PO TBDP: 4.0000 mg | ORAL_TABLET | Freq: Four times a day (QID) | ORAL | Status: DC | PRN

## 2018-06-21 MED ORDER — CEFAZOLIN SODIUM-DEXTROSE 2-4 GM/100ML-% IV SOLN
INTRAVENOUS | Status: AC
  Filled 2018-06-21: qty 100

## 2018-06-21 MED ORDER — FENTANYL CITRATE (PF) 100 MCG/2ML IJ SOLN
25.0000 ug | INTRAMUSCULAR | Status: DC | PRN
  Administered 2018-06-21 (×3): 25 ug via INTRAVENOUS

## 2018-06-21 MED ORDER — ACETAMINOPHEN 500 MG PO TABS
ORAL_TABLET | ORAL | Status: AC
  Administered 2018-06-21: 1000 mg via ORAL
  Filled 2018-06-21: qty 2

## 2018-06-21 MED ORDER — ONDANSETRON HCL 4 MG/2ML IJ SOLN: 4.0000 mg | Freq: Four times a day (QID) | INTRAMUSCULAR | Status: DC | PRN

## 2018-06-21 MED ORDER — PHENYLEPHRINE 40 MCG/ML (10ML) SYRINGE FOR IV PUSH (FOR BLOOD PRESSURE SUPPORT)
PREFILLED_SYRINGE | INTRAVENOUS | Status: AC
  Filled 2018-06-21: qty 10

## 2018-06-21 MED ORDER — ACETAMINOPHEN 160 MG/5ML PO SOLN: 1000.0000 mg | Freq: Once | ORAL | Status: DC | PRN

## 2018-06-21 MED ORDER — INSULIN ASPART 100 UNIT/ML ~~LOC~~ SOLN
0.0000 [IU] | Freq: Three times a day (TID) | SUBCUTANEOUS | Status: DC
  Administered 2018-06-21 – 2018-06-22 (×3): 3 [IU] via SUBCUTANEOUS
  Administered 2018-06-22 (×2): 2 [IU] via SUBCUTANEOUS

## 2018-06-21 MED ORDER — TRAMADOL HCL 50 MG PO TABS
50.0000 mg | ORAL_TABLET | Freq: Four times a day (QID) | ORAL | Status: DC | PRN
  Administered 2018-06-21: 50 mg via ORAL
  Filled 2018-06-21 (×2): qty 1

## 2018-06-21 MED ORDER — SODIUM CHLORIDE 0.9 % IV SOLN
INTRAVENOUS | Status: DC
  Administered 2018-06-21: 15:00:00 via INTRAVENOUS

## 2018-06-21 MED ORDER — LIDOCAINE 2% (20 MG/ML) 5 ML SYRINGE
INTRAMUSCULAR | Status: AC
  Filled 2018-06-21: qty 5

## 2018-06-21 MED ORDER — BSS IO SOLN
15.0000 mL | Freq: Once | INTRAOCULAR | Status: AC
  Administered 2018-06-21: 15 mL
  Filled 2018-06-21: qty 15

## 2018-06-21 MED ORDER — GABAPENTIN 400 MG PO CAPS
400.0000 mg | ORAL_CAPSULE | Freq: Two times a day (BID) | ORAL | Status: DC
  Administered 2018-06-21 – 2018-06-23 (×5): 400 mg via ORAL
  Filled 2018-06-21 (×5): qty 1

## 2018-06-21 MED ORDER — LIDOCAINE HCL (CARDIAC) PF 100 MG/5ML IV SOSY
PREFILLED_SYRINGE | INTRAVENOUS | Status: DC | PRN
  Administered 2018-06-21: 60 mg via INTRAVENOUS

## 2018-06-21 MED ORDER — OXYCODONE HCL 5 MG PO TABS
5.0000 mg | ORAL_TABLET | ORAL | Status: DC | PRN
  Administered 2018-06-21 – 2018-06-23 (×6): 5 mg via ORAL
  Filled 2018-06-21 (×6): qty 1

## 2018-06-21 MED ORDER — ACETAMINOPHEN 500 MG PO TABS
1000.0000 mg | ORAL_TABLET | ORAL | Status: AC
  Administered 2018-06-21: 1000 mg via ORAL

## 2018-06-21 MED ORDER — ACETAMINOPHEN 500 MG PO TABS
1000.0000 mg | ORAL_TABLET | Freq: Four times a day (QID) | ORAL | Status: DC
  Administered 2018-06-21 – 2018-06-22 (×5): 1000 mg via ORAL
  Administered 2018-06-23: 500 mg via ORAL
  Administered 2018-06-23: 1000 mg via ORAL
  Filled 2018-06-21 (×7): qty 2

## 2018-06-21 MED ORDER — ONDANSETRON HCL 4 MG/2ML IJ SOLN
INTRAMUSCULAR | Status: AC
  Filled 2018-06-21: qty 2

## 2018-06-21 MED ORDER — METFORMIN HCL 500 MG PO TABS
1000.0000 mg | ORAL_TABLET | Freq: Two times a day (BID) | ORAL | Status: DC
  Administered 2018-06-22 – 2018-06-23 (×3): 1000 mg via ORAL
  Filled 2018-06-21 (×3): qty 2

## 2018-06-21 MED ORDER — BUPIVACAINE-EPINEPHRINE (PF) 0.5% -1:200000 IJ SOLN
INTRAMUSCULAR | Status: DC | PRN
  Administered 2018-06-21: 30 mL via PERINEURAL

## 2018-06-21 MED ORDER — ROSUVASTATIN CALCIUM 20 MG PO TABS
40.0000 mg | ORAL_TABLET | Freq: Every day | ORAL | Status: DC
  Administered 2018-06-22: 40 mg via ORAL
  Filled 2018-06-21: qty 2

## 2018-06-21 MED ORDER — MORPHINE SULFATE (PF) 2 MG/ML IV SOLN
INTRAVENOUS | Status: AC
  Filled 2018-06-21: qty 1

## 2018-06-21 MED ORDER — FENTANYL CITRATE (PF) 250 MCG/5ML IJ SOLN
INTRAMUSCULAR | Status: AC
  Filled 2018-06-21: qty 5

## 2018-06-21 MED ORDER — METHOCARBAMOL 500 MG PO TABS
500.0000 mg | ORAL_TABLET | Freq: Four times a day (QID) | ORAL | Status: DC | PRN
  Administered 2018-06-21: 500 mg via ORAL
  Filled 2018-06-21: qty 1

## 2018-06-21 MED ORDER — PROPOFOL 1000 MG/100ML IV EMUL
INTRAVENOUS | Status: AC
  Filled 2018-06-21: qty 200

## 2018-06-21 MED ORDER — 0.9 % SODIUM CHLORIDE (POUR BTL) OPTIME
TOPICAL | Status: DC | PRN
  Administered 2018-06-21: 1000 mL

## 2018-06-21 MED ORDER — MIDAZOLAM HCL 2 MG/2ML IJ SOLN
INTRAMUSCULAR | Status: AC
  Filled 2018-06-21: qty 2

## 2018-06-21 MED ORDER — PROPOFOL 10 MG/ML IV BOLUS
INTRAVENOUS | Status: DC | PRN
  Administered 2018-06-21: 150 mg via INTRAVENOUS

## 2018-06-21 MED ORDER — GABAPENTIN 100 MG PO CAPS: 100.0000 mg | ORAL_CAPSULE | ORAL | Status: DC

## 2018-06-21 MED ORDER — ACETAMINOPHEN 10 MG/ML IV SOLN: 1000.0000 mg | Freq: Once | INTRAVENOUS | Status: DC | PRN

## 2018-06-21 MED ORDER — PROPOFOL 10 MG/ML IV BOLUS
INTRAVENOUS | Status: AC
  Filled 2018-06-21: qty 20

## 2018-06-21 MED ORDER — CEFAZOLIN SODIUM-DEXTROSE 2-4 GM/100ML-% IV SOLN
2.0000 g | INTRAVENOUS | Status: AC
  Administered 2018-06-21: 2 g via INTRAVENOUS

## 2018-06-21 SURGICAL SUPPLY — 54 items
ADH SKN CLS APL DERMABOND .7 (GAUZE/BANDAGES/DRESSINGS) ×4
APPLIER CLIP 9.375 MED OPEN (MISCELLANEOUS) ×3
APR CLP MED 9.3 20 MLT OPN (MISCELLANEOUS) ×1
BINDER BREAST LRG (GAUZE/BANDAGES/DRESSINGS) IMPLANT
BINDER BREAST XLRG (GAUZE/BANDAGES/DRESSINGS) ×2 IMPLANT
BIOPATCH RED 1 DISK 7.0 (GAUZE/BANDAGES/DRESSINGS) ×3 IMPLANT
BIOPATCH RED 1IN DISK 7.0MM (GAUZE/BANDAGES/DRESSINGS) ×2
CANISTER SUCT 3000ML PPV (MISCELLANEOUS) ×3 IMPLANT
CHLORAPREP W/TINT 26ML (MISCELLANEOUS) ×3 IMPLANT
CLIP APPLIE 9.375 MED OPEN (MISCELLANEOUS) ×1 IMPLANT
CLOSURE WOUND 1/2 X4 (GAUZE/BANDAGES/DRESSINGS) ×2
COVER SURGICAL LIGHT HANDLE (MISCELLANEOUS) ×3 IMPLANT
COVER TRANSDUCER ULTRASND GEL (DRAPE) ×3 IMPLANT
COVER WAND RF STERILE (DRAPES) ×3 IMPLANT
DERMABOND ADVANCED (GAUZE/BANDAGES/DRESSINGS) ×8
DERMABOND ADVANCED .7 DNX12 (GAUZE/BANDAGES/DRESSINGS) ×1 IMPLANT
DEVICE DUBIN SPECIMEN MAMMOGRA (MISCELLANEOUS) ×3 IMPLANT
DRAIN CHANNEL 19F RND (DRAIN) ×5 IMPLANT
DRAPE CHEST BREAST 15X10 FENES (DRAPES) ×3 IMPLANT
DRSG PAD ABDOMINAL 8X10 ST (GAUZE/BANDAGES/DRESSINGS) ×6 IMPLANT
DRSG TEGADERM 2-3/8X2-3/4 SM (GAUZE/BANDAGES/DRESSINGS) ×2 IMPLANT
ELECT BLADE 4.0 EZ CLEAN MEGAD (MISCELLANEOUS) ×3
ELECT CAUTERY BLADE 6.4 (BLADE) ×3 IMPLANT
ELECT REM PT RETURN 9FT ADLT (ELECTROSURGICAL) ×3
ELECTRODE BLDE 4.0 EZ CLN MEGD (MISCELLANEOUS) ×1 IMPLANT
ELECTRODE REM PT RTRN 9FT ADLT (ELECTROSURGICAL) ×1 IMPLANT
EVACUATOR SILICONE 100CC (DRAIN) ×3 IMPLANT
GLOVE BIO SURGEON STRL SZ7 (GLOVE) ×6 IMPLANT
GLOVE BIOGEL PI IND STRL 7.5 (GLOVE) ×1 IMPLANT
GLOVE BIOGEL PI INDICATOR 7.5 (GLOVE) ×2
GOWN STRL REUS W/ TWL LRG LVL3 (GOWN DISPOSABLE) ×2 IMPLANT
GOWN STRL REUS W/TWL LRG LVL3 (GOWN DISPOSABLE) ×12
KIT BASIN OR (CUSTOM PROCEDURE TRAY) ×3 IMPLANT
KIT MARKER MARGIN INK (KITS) ×3 IMPLANT
KIT TURNOVER KIT B (KITS) ×3 IMPLANT
MARKER SKIN DUAL TIP RULER LAB (MISCELLANEOUS) ×3 IMPLANT
NDL FILTER BLUNT 18X1 1/2 (NEEDLE) IMPLANT
NDL HYPO 25GX1X1/2 BEV (NEEDLE) IMPLANT
NEEDLE FILTER BLUNT 18X 1/2SAF (NEEDLE)
NEEDLE FILTER BLUNT 18X1 1/2 (NEEDLE) IMPLANT
NEEDLE HYPO 25GX1X1/2 BEV (NEEDLE) ×3 IMPLANT
NS IRRIG 1000ML POUR BTL (IV SOLUTION) ×3 IMPLANT
PACK GENERAL/GYN (CUSTOM PROCEDURE TRAY) ×3 IMPLANT
PAD ARMBOARD 7.5X6 YLW CONV (MISCELLANEOUS) ×3 IMPLANT
PENCIL SMOKE EVACUATOR (MISCELLANEOUS) ×3 IMPLANT
STRIP CLOSURE SKIN 1/2X4 (GAUZE/BANDAGES/DRESSINGS) ×3 IMPLANT
SUT ETHILON 2 0 FS 18 (SUTURE) ×5 IMPLANT
SUT MNCRL AB 4-0 PS2 18 (SUTURE) ×7 IMPLANT
SUT SILK 2 0 SH (SUTURE) ×2 IMPLANT
SUT VIC AB 2-0 SH 18 (SUTURE) ×4 IMPLANT
SUT VIC AB 3-0 SH 18 (SUTURE) ×5 IMPLANT
SYR CONTROL 10ML LL (SYRINGE) ×2 IMPLANT
TOWEL OR 17X24 6PK STRL BLUE (TOWEL DISPOSABLE) ×3 IMPLANT
TOWEL OR 17X26 10 PK STRL BLUE (TOWEL DISPOSABLE) ×3 IMPLANT

## 2018-06-21 NOTE — H&P (Signed)
Charlene Perry is an 68 y.o. female.   Chief Complaint: breast cancer HPI: 53 yof referred by Dr Lorne Skeens for right breast cancer that I have seen previously. she has no prior history of breast disease except left FA in 1970s. she has no fh of breast or ovarian cancer. in april she noted a right breast mass. this is not tender. no nipple dc. she had mm that showed a right uoq architectural distortion at site of palpable mass. she had [place] that shows a 1.4 cm mass. this underwent biopsy and clip placement in [place]. 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 since then she has undergone [place] with 2 abnormal nodes. one is biopsied and is IDC. she has also undergone an mri that shows the ruoq cancer that had prior biopsy associated with at least 6 cm of AP enhancement. a biopsy was done of another area and this is ILC also. 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 the left breast had a 7 mm nodule that was biopsied and this is fcc. this is concordant. she has had some anxiety about surgery and we have had her on letrozole for now. she has some hot flashes and some joint pain but is tolerating and taking it. she is now ready for surgery. she has seen plastic surgery and does not desire reconstruction. she returns today prior tos rugery without any changes   Past Medical History:  Diagnosis Date  . Arthritis   . Breast cancer in female [facility])    Right  . Cancer ([place])   . Diabetes mellitus without complication ([place])   . Family history of breast cancer   . Family history of colon cancer   . Hypertension    takes fluid pills  . Hypothyroidism   . Sleep apnea    back in the 1990's  . Vitiligo     Past Surgical History:  Procedure Laterality Date  . BREAST SURGERY     left breast fibro adenoma  . CATARACT EXTRACTION Right 2019  . CERVICAL FUSION N/A    C5 &6  .  CESAREAN SECTION    . CHOLECYSTECTOMY  2004-2005  . CYSTECTOMY     patient denies  . EYE SURGERY    . MYOMECTOMY  1986  . PARTIAL HYSTERECTOMY  1995  . PILONIDAL CYST EXCISION    . UVULOPALATOPHARYNGOPLASTY, TONSILLECTOMY AND SEPTOPLASTY      Family History  Problem Relation Age of Onset  . Colon cancer Mother 45  . Colon cancer Sister 26  . Colon cancer Brother        ex early 20's  . Breast cancer Other        40's   Social History:  reports that she has never smoked. She has never used smokeless tobacco. She reports previous alcohol use. She reports that she does not use drugs.  Allergies: No Known Allergies  Medications Prior to Admission  Medication Sig Dispense Refill  . aspirin EC 81 MG tablet Take 81 mg by mouth daily.    . celecoxib (CELEBREX) 200 MG capsule Take 200 mg by mouth daily.    . cetirizine (ZYRTEC) 10 MG tablet Take 10 mg by mouth daily.    . furosemide (LASIX) 40 MG tablet Take 80 mg by mouth daily.    Marland Kitchen gabapentin (NEURONTIN) 400 MG capsule Take 400 mg by mouth 2 (two)  times daily.    Marland Kitchen letrozole (FEMARA) 2.5 MG tablet Take 2.5 mg by mouth every evening.    Marland Kitchen levothyroxine (SYNTHROID, LEVOTHROID) 125 MCG tablet Take 125 mcg by mouth daily before breakfast.    . metFORMIN (GLUCOPHAGE) 1000 MG tablet Take 1,000 mg by mouth 2 (two) times daily with a meal.    . potassium chloride (K-DUR,KLOR-CON) 10 MEQ tablet Take 20 mEq by mouth daily.     . rosuvastatin (CRESTOR) 40 MG tablet Take 40 mg by mouth at bedtime.       Results for orders placed or performed during the hospital encounter of 06/21/18 (from the past 48 hour(s))  Glucose, capillary     Status: Abnormal   Collection Time: 06/21/18  6:05 AM  Result Value Ref Range   Glucose-Capillary 315 (H) 70 - 99 mg/dL   Comment 1 Notify RN    Comment 2 Document in Chart    No results found.  ROS  Blood pressure (!) 174/84, pulse 99, temperature 98.6 F (37 C), temperature source Oral, resp. rate 20,  height 5' 6.5" (1.689 m), weight 104.3 kg, SpO2 96 %. Physical Exam   Vitals (April Staton CMA; 06/14/2018 1:44 PM) 06/14/2018 1:43 PM Weight: 231 lb Height: 67in Body Surface Area: 2.15 m Body Mass Index: 36.18 kg/m  Temp.: 98.54F(Oral)  Pulse: 101 (Regular)  BP: 170/100 (Sitting, Left Arm, Standard) Physical Exam [name] MD; 06/14/2018 5:15 PM) General Mental Status-Alert. Orientation-Oriented X3.  Chest and Lung Exam Chest and lung exam reveals -quiet, even and easy respiratory effort with no use of accessory muscles.  Breast Nipples-No Discharge. Breast Lump-No Palpable Breast Mass.  Cardiovascular Cardiovascular examination reveals -normal heart sounds, regular rate and rhythm with no murmurs.  Lymphatic Head & Neck  General Head & Neck Lymphatics: Bilateral - Description - Normal. Axillary  General Axillary Region: Bilateral - Description - Normal. Note: no Frost adenopathy   Assessment/Plan BREAST CANCER OF UPPER-OUTER QUADRANT OF RIGHT FEMALE BREAST (C50.411) Story: right mastectomy/tad stop letrozole tomorrow discussed mastectomy without reconstruction again which is her desire. discussed risks and recovery again. discussed tad with seed placement and sn biopsy. discussed drains. discussed possibility of further axillary surgery pending final pathology as well will proceed on monday   [name], MD 06/21/2018, 7:05 AM

## 2018-06-21 NOTE — Transfer of Care (Signed)
Immediate Anesthesia Transfer of Care Note  Patient: Kaaliyah Kita [facility]  Procedure(s) Performed: RIGHT TOTAL MASTECTOMY WITH RIGHT AXILLARY SENTINEL NODE BIOPSY AND RIGHT SEED GUIDED NODE EXCISION (Right Breast)  Patient Location: PACU  Anesthesia Type:GA combined with regional for post-op pain  Level of Consciousness: awake, alert , oriented and patient cooperative  Airway & Oxygen Therapy: Patient Spontanous Breathing and Patient connected to nasal cannula oxygen  Post-op Assessment: Report given to RN and Post -op Vital signs reviewed and stable  Post vital signs: Reviewed and stable  Last Vitals:  Vitals Value Taken Time  BP 123/65 06/21/2018  9:34 AM  Temp    Pulse 90 06/21/2018  9:34 AM  Resp 25 06/21/2018  9:34 AM  SpO2 100 % 06/21/2018  9:34 AM  Vitals shown include unvalidated device data.  Last Pain:  Vitals:   06/21/18 5409  TempSrc:   PainSc: 0-No pain         Complications: No apparent anesthesia complications

## 2018-06-21 NOTE — Anesthesia Preprocedure Evaluation (Signed)
Anesthesia Evaluation   Patient awake    Reviewed: Allergy & Precautions, NPO status , Patient's Chart, lab work & pertinent test results  History of Anesthesia Complications Negative for: history of anesthetic complications  Airway Mallampati: IV  TM Distance: >3 FB Neck ROM: Full    Dental  (+) Teeth Intact   Pulmonary neg shortness of breath, sleep apnea and Continuous Positive Airway Pressure Ventilation , neg COPD, neg recent URI,    breath sounds clear to auscultation       Cardiovascular hypertension, (-) angina(-) Past MI and (-) CHF (-) dysrhythmias  Rhythm:Regular + Systolic murmurs    Neuro/Psych negative neurological ROS  negative psych ROS   GI/Hepatic negative GI ROS, Neg liver ROS,   Endo/Other  diabetes, Type 2, Oral Hypoglycemic AgentsHypothyroidism Morbid obesity  Renal/GU Renal InsufficiencyRenal disease     Musculoskeletal  (+) Arthritis ,   Abdominal   Peds  Hematology   Anesthesia Other Findings   Reproductive/Obstetrics                             Anesthesia Physical Anesthesia Plan  ASA: III  Anesthesia Plan: General and Regional   Post-op Pain Management:    Induction: Intravenous  PONV Risk Score and Plan: 3 and Ondansetron, Dexamethasone, Propofol infusion and TIVA  Airway Management Planned: Oral ETT and LMA  Additional Equipment: None  Intra-op Plan:   Post-operative Plan: Extubation in OR  Informed Consent: I have reviewed the patients History and Physical, chart, labs and discussed the procedure including the risks, benefits and alternatives for the proposed anesthesia with the patient or authorized representative who has indicated his/her understanding and acceptance.   Dental advisory given  Plan Discussed with: CRNA and Surgeon  Anesthesia Plan Comments:         Anesthesia Quick Evaluation

## 2018-06-21 NOTE — H&P (View-Only) (Signed)
Charlene Perry is an 68 y.o. female.   Chief Complaint: breast cancer HPI: 77 yof referred by Dr Lorne Skeens for right breast cancer that I have seen previously. she has no prior history of breast disease except left FA in 1970s. she has no fh of breast or ovarian cancer. in april she noted a right breast mass. this is not tender. no nipple dc. she had mm that showed a right uoq architectural distortion at site of palpable mass. she had [place] that shows a 1.4 cm mass. this underwent biopsy and clip placement in [place]. 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 since then she has undergone [place] with 2 abnormal nodes. one is biopsied and is IDC. she has also undergone an mri that shows the ruoq cancer that had prior biopsy associated with at least 6 cm of AP enhancement. a biopsy was done of another area and this is ILC also. 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 the left breast had a 7 mm nodule that was biopsied and this is fcc. this is concordant. she has had some anxiety about surgery and we have had her on letrozole for now. she has some hot flashes and some joint pain but is tolerating and taking it. she is now ready for surgery. she has seen plastic surgery and does not desire reconstruction. she returns today prior tos rugery without any changes   Past Medical History:  Diagnosis Date  . Arthritis   . Breast cancer in female [facility])    Right  . Cancer ([place])   . Diabetes mellitus without complication ([place])   . Family history of breast cancer   . Family history of colon cancer   . Hypertension    takes fluid pills  . Hypothyroidism   . Sleep apnea    back in the 1990's  . Vitiligo     Past Surgical History:  Procedure Laterality Date  . BREAST SURGERY     left breast fibro adenoma  . CATARACT EXTRACTION Right 2019  . CERVICAL FUSION N/A    C5 &6  .  CESAREAN SECTION    . CHOLECYSTECTOMY  2004-2005  . CYSTECTOMY     patient denies  . EYE SURGERY    . MYOMECTOMY  1986  . PARTIAL HYSTERECTOMY  1995  . PILONIDAL CYST EXCISION    . UVULOPALATOPHARYNGOPLASTY, TONSILLECTOMY AND SEPTOPLASTY      Family History  Problem Relation Age of Onset  . Colon cancer Mother 73  . Colon cancer Sister 71  . Colon cancer Brother        ex early 70's  . Breast cancer Other        40's   Social History:  reports that she has never smoked. She has never used smokeless tobacco. She reports previous alcohol use. She reports that she does not use drugs.  Allergies: No Known Allergies  Medications Prior to Admission  Medication Sig Dispense Refill  . aspirin EC 81 MG tablet Take 81 mg by mouth daily.    . celecoxib (CELEBREX) 200 MG capsule Take 200 mg by mouth daily.    . cetirizine (ZYRTEC) 10 MG tablet Take 10 mg by mouth daily.    . furosemide (LASIX) 40 MG tablet Take 80 mg by mouth daily.    Marland Kitchen gabapentin (NEURONTIN) 400 MG capsule Take 400 mg by mouth 2 (two)  times daily.    Marland Kitchen letrozole (FEMARA) 2.5 MG tablet Take 2.5 mg by mouth every evening.    Marland Kitchen levothyroxine (SYNTHROID, LEVOTHROID) 125 MCG tablet Take 125 mcg by mouth daily before breakfast.    . metFORMIN (GLUCOPHAGE) 1000 MG tablet Take 1,000 mg by mouth 2 (two) times daily with a meal.    . potassium chloride (K-DUR,KLOR-CON) 10 MEQ tablet Take 20 mEq by mouth daily.     . rosuvastatin (CRESTOR) 40 MG tablet Take 40 mg by mouth at bedtime.       Results for orders placed or performed during the hospital encounter of 06/21/18 (from the past 48 hour(s))  Glucose, capillary     Status: Abnormal   Collection Time: 06/21/18  6:05 AM  Result Value Ref Range   Glucose-Capillary 315 (H) 70 - 99 mg/dL   Comment 1 Notify RN    Comment 2 Document in Chart    No results found.  ROS  Blood pressure (!) 174/84, pulse 99, temperature 98.6 F (37 C), temperature source Oral, resp. rate 20,  height 5' 6.5" (1.689 m), weight 104.3 kg, SpO2 96 %. Physical Exam   Vitals (April Staton CMA; 06/14/2018 1:44 PM) 06/14/2018 1:43 PM Weight: 231 lb Height: 67in Body Surface Area: 2.15 m Body Mass Index: 36.18 kg/m  Temp.: 98.62F(Oral)  Pulse: 101 (Regular)  BP: 170/100 (Sitting, Left Arm, Standard) Physical Exam [name] MD; 06/14/2018 5:15 PM) General Mental Status-Alert. Orientation-Oriented X3.  Chest and Lung Exam Chest and lung exam reveals -quiet, even and easy respiratory effort with no use of accessory muscles.  Breast Nipples-No Discharge. Breast Lump-No Palpable Breast Mass.  Cardiovascular Cardiovascular examination reveals -normal heart sounds, regular rate and rhythm with no murmurs.  Lymphatic Head & Neck  General Head & Neck Lymphatics: Bilateral - Description - Normal. Axillary  General Axillary Region: Bilateral - Description - Normal. Note: no [place] adenopathy   Assessment/Plan BREAST CANCER OF UPPER-OUTER QUADRANT OF RIGHT FEMALE BREAST (C50.411) Story: right mastectomy/tad stop letrozole tomorrow discussed mastectomy without reconstruction again which is her desire. discussed risks and recovery again. discussed tad with seed placement and sn biopsy. discussed drains. discussed possibility of further axillary surgery pending final pathology as well will proceed on monday   [name], MD 06/21/2018, 7:05 AM

## 2018-06-21 NOTE — Interval H&P Note (Signed)
History and Physical Interval Note:  06/21/2018 7:06 AM  Charlene Perry  has presented today for surgery, with the diagnosis of RIGHT BREAST CANCER  The various methods of treatment have been discussed with the patient and family. After consideration of risks, benefits and other options for treatment, the patient has consented to  Procedure(s): RIGHT TOTAL MASTECTOMY WITH RIGHT AXILLARY SENTINEL NODE BIOPSY AND RIGHT SEED GUIDED NODE EXCISION (Right) as a surgical intervention .  The patient's history has been reviewed, patient examined, no change in status, stable for surgery.  I have reviewed the patient's chart and labs.  Questions were answered to the patient's satisfaction.     [name]

## 2018-06-21 NOTE — Plan of Care (Signed)
  Problem: Clinical Measurements: Goal: Ability to maintain clinical measurements within normal limits will improve Outcome: Progressing Goal: Postoperative complications will be avoided or minimized Outcome: Progressing   Problem: Skin Integrity: Goal: Demonstration of wound healing without infection will improve Outcome: Progressing   Problem: Education: Goal: Knowledge of disease or condition will improve Outcome: Progressing   Problem: Activity: Goal: Ability to maintain or regain function will improve Outcome: Progressing   Problem: Clinical Measurements: Goal: Postoperative complications will be avoided or minimized Outcome: Progressing   Problem: Self-Concept: Goal: Ability to verbalize positive feelings about self will improve Outcome: Progressing   Problem: Pain Management: Goal: Expressions of feelings of enhanced comfort will increase Outcome: Progressing   Problem: Skin Integrity: Goal: Demonstration of wound healing without infection will improve Outcome: Progressing

## 2018-06-21 NOTE — Anesthesia Procedure Notes (Signed)
Anesthesia Regional Block: Pectoralis block   Pre-Anesthetic Checklist: ,, timeout performed, Correct Patient, Correct Site, Correct Laterality, Correct Procedure, Correct Position, site marked, Risks and benefits discussed,  Surgical consent,  Pre-op evaluation,  At surgeon's request and post-op pain management  Laterality: Right  Prep: chloraprep       Needles:  Injection technique: Single-shot     Needle Length: 9cm  Needle Gauge: 22     Additional Needles: Arrow StimuQuik ECHO Echogenic Stimulating PNB Needle  Procedures:,,,, ultrasound used (permanent image in chart),,,,  Narrative:  Start time: 06/21/2018 7:19 AM End time: 06/21/2018 7:24 AM Injection made incrementally with aspirations every 5 mL.  Performed by: Personally  Anesthesiologist: Oleta Mouse, MD

## 2018-06-21 NOTE — Op Note (Signed)
Preoperative diagnosis: clinical stage II right breast cancer Postoperative diagnosis: same as above Procedure:  1. Right total mastectomy 2. Right axillary sentinel node biopsy 3. Right radioactive seed guided excision of axillary node Surgeon: Dr Serita Grammes Asst: Carlena Hurl, PA-C Anesthesia: general with pec block EBL: 50 cc Drains: 29 Fr Blake drain Complications none Specimens: 1. Right breast marked short superior, long lateral 2. Right axillary radioactive seed containing node 3. Right axillary sentinel nodes with highest count 516 Sponge and needle count correct at completion dispo to recovery stable  Indications: This is a  69 year old female with clinical stage II right breast cancer.  She has been hesitant to have surgery so has been on letrozole.  She is agreeable now and we elected to proceed with right mastectomy and targeted node dissection.  She declined reconstruction. She had a seed placed in the axillary node prior to beginning.  Procedure: Informed consent was obtained the patient first underwent a pectoral block.  She was given antibiotics and SCDs were in place.  She was placed under general endotracheal anesthesia without complication.  Her right breast was prepped and draped in the standard sterile surgical fashion.  Surgical timeout was then performed.  I made a large elliptical incision encompassing the nipple and the areolar complex.  I then created flaps of the parasternal region, clavicle, inframammary fold, latissimus laterally.  I then removed the breast and the pectoralis fascia in total.  This was marked with a short stitch superior and a long stitch lateral.  This was passed off the table.  I then used the neoprobe to identify the radioactive seed containing node which was also a sentinel node.  This was excised.  Confirmation of removal of the seed and the prior clip were completed with mammography.  I then identified several other sentinel nodes with the  highest count as listed above.  These were passed off the table as a separate specimen.  There was no more background radioactivity.  I then he obtained hemostasis.  Irrigation was performed.  I placed a single 52 [place] Blake drain and secured this with 2-0 nylon suture.  I then closed the dermis with 3-0 Vicryl.  I did remove some extra tissue in the lateral portion and ended up closing this in a Y configuration.  This was done to decrease the amount of extra tissue laterally.  The skin was closed with 4-0 Monocryl.  Glue and Steri-Strips were applied.  A binder was placed.  She tolerated this well was extubated and transferred to recovery stable.

## 2018-06-21 NOTE — Anesthesia Procedure Notes (Signed)
Procedure Name: LMA Insertion Date/Time: 06/21/2018 7:50 AM Performed by: Raenette Rover, CRNA Pre-anesthesia Checklist: Patient identified, Emergency Drugs available, Suction available and Patient being monitored Patient Re-evaluated:Patient Re-evaluated prior to induction Oxygen Delivery Method: Circle system utilized Preoxygenation: Pre-oxygenation with 100% oxygen Induction Type: IV induction LMA: LMA inserted LMA Size: 4.0 Number of attempts: 1 Placement Confirmation: positive ETCO2,  CO2 detector and breath sounds checked- equal and bilateral Tube secured with: Tape Dental Injury: Teeth and Oropharynx as per pre-operative assessment

## 2018-06-22 ENCOUNTER — Encounter (HOSPITAL_COMMUNITY): Payer: Self-pay | Admitting: General Surgery

## 2018-06-22 DIAGNOSIS — C50411 Malignant neoplasm of upper-outer quadrant of right female breast: Secondary | ICD-10-CM | POA: Diagnosis not present

## 2018-06-22 LAB — BASIC METABOLIC PANEL
Anion gap: 7 (ref 5–15)
BUN: 17 mg/dL (ref 8–23)
CO2: 26 mmol/L (ref 22–32)
Calcium: 8.5 mg/dL — ABNORMAL LOW (ref 8.9–10.3)
Chloride: 105 mmol/L (ref 98–111)
Creatinine, Ser: 1.2 mg/dL — ABNORMAL HIGH (ref 0.44–1.00)
GFR calc Af Amer: 55 mL/min — ABNORMAL LOW (ref >60)
GFR calc non Af Amer: 47 mL/min — ABNORMAL LOW (ref >60)
Glucose, Bld: 149 mg/dL — ABNORMAL HIGH (ref 70–99)
Potassium: 3.8 mmol/L (ref 3.5–5.1)
Sodium: 138 mmol/L (ref 135–145)

## 2018-06-22 LAB — GLUCOSE, CAPILLARY
GLUCOSE-CAPILLARY: 139 mg/dL — AB (ref 70–99)
Glucose-Capillary: 122 mg/dL — ABNORMAL HIGH (ref 70–99)
Glucose-Capillary: 163 mg/dL — ABNORMAL HIGH (ref 70–99)
Glucose-Capillary: 126 mg/dL — ABNORMAL HIGH (ref 70–99)

## 2018-06-22 MED ORDER — OXYCODONE HCL 5 MG PO TABS: 5.0000 mg | ORAL_TABLET | ORAL | 0 refills | Status: DC | PRN

## 2018-06-22 MED ORDER — METHOCARBAMOL 500 MG PO TABS: 500.0000 mg | ORAL_TABLET | Freq: Four times a day (QID) | ORAL | 0 refills | Status: DC | PRN

## 2018-06-22 NOTE — Plan of Care (Signed)
  Problem: Clinical Measurements: Goal: Postoperative complications will be avoided or minimized Outcome: Progressing   Problem: Education: Goal: Knowledge of General Education information will improve Description Including pain rating scale, medication(s)/side effects and non-pharmacologic comfort measures Outcome: Progressing   Problem: Clinical Measurements: Goal: Will remain free from infection Outcome: Progressing   Problem: Nutrition: Goal: Adequate nutrition will be maintained Outcome: Progressing

## 2018-06-22 NOTE — Anesthesia Postprocedure Evaluation (Signed)
Anesthesia Post Note  Patient: Kishia Shackett [facility]  Procedure(s) Performed: RIGHT TOTAL MASTECTOMY WITH RIGHT AXILLARY SENTINEL NODE BIOPSY AND RIGHT SEED GUIDED NODE EXCISION (Right Breast)     Patient location during evaluation: PACU Anesthesia Type: Regional and General Level of consciousness: awake and alert Pain management: pain level controlled Vital Signs Assessment: post-procedure vital signs reviewed and stable Respiratory status: spontaneous breathing, nonlabored ventilation, respiratory function stable and patient connected to nasal cannula oxygen Cardiovascular status: blood pressure returned to baseline and stable Postop Assessment: no apparent nausea or vomiting (symptoms consistent with mild corenal abrasion OS, drops ordered with improvement in symptoms ) Anesthetic complications: no    Last Vitals:  Vitals:   06/22/18 0211 06/22/18 0526  BP: 115/60 (!) 119/57  Pulse: 79 77  Resp: 16 16  Temp: (!) 36.4 C 36.8 C  SpO2: 96% 94%    Last Pain:  Vitals:   06/22/18 0650  TempSrc:   PainSc: 0-No pain                 Eliani Leclere

## 2018-06-22 NOTE — Progress Notes (Signed)
1 Day Post-Op   Subjective/Chief Complaint: Tired, sore but overall well and as expected, has been up some, tol diet   Objective: Vital signs in last 24 hours: Temp:  [97.5 F (36.4 C)-98.3 F (36.8 C)] 98.3 F (36.8 C) (01/07 0526) Pulse Rate:  [77-101] 77 (01/07 0526) Resp:  [12-19] 16 (01/07 0526) BP: (111-155)/(57-89) 119/57 (01/07 0526) SpO2:  [92 %-100 %] 94 % (01/07 0526) Last BM Date: 06/20/18  Intake/Output from previous day: 01/06 0701 - 01/07 0700 In: 1644.8 [P.O.:400; I.V.:1144.8] Out: 29 [Urine:650; Drains:155; Blood:50] Intake/Output this shift: Total I/O In: -  Out: 300 [Urine:300]  Resp: clear to auscultation bilaterally Cardio: regular rate and rhythm Incision/Wound:flap viable and without hematoma, drain serosang as expected  Lab Results:  No results for input(s): WBC, HGB, HCT, PLT in the last 72 hours. BMET Recent Labs    06/22/18 0347  NA 138  K 3.8  CL 105  CO2 26  GLUCOSE 149*  BUN 17  CREATININE 1.20*  CALCIUM 8.5*    Studies/Results: Nm Sentinel Node Inj-no Rpt (breast)  Result Date: 06/21/2018 Sulfur colloid was injected by the nuclear medicine technologist for melanoma sentinel node.   Mm Breast Surgical Specimen  Result Date: 06/21/2018 CLINICAL DATA:  Known right breast cancer. Status post surgical excision of a right axillary lymph node. EXAM: SPECIMEN RADIOGRAPH OF THE RIGHT BREAST COMPARISON:  Previous exam(s). FINDINGS: Status post excision of the right axilla. The radioactive seed and biopsy marker clip are present, completely intact, and were marked for pathology. IMPRESSION: Specimen radiograph of the right axilla. Electronically Signed   By: Lillia Mountain M.D.   On: 06/21/2018 08:45    Anti-infectives: Anti-infectives (From admission, onward)   Start     Dose/Rate Route Frequency Ordered Stop   06/21/18 0630  ceFAZolin (ANCEF) IVPB 2g/100 mL premix     2 g 200 mL/hr over 30 Minutes Intravenous On call to O.R. 06/21/18  1610 06/21/18 0804      Assessment/Plan: POD 1 right tm/tad  Continue oral pain meds pulm toilet oob today Carb mod diet Back on all home meds If does well this am will dc home later today  [name] 06/22/2018

## 2018-06-23 DIAGNOSIS — C50411 Malignant neoplasm of upper-outer quadrant of right female breast: Secondary | ICD-10-CM | POA: Diagnosis not present

## 2018-06-23 LAB — GLUCOSE, CAPILLARY
GLUCOSE-CAPILLARY: 112 mg/dL — AB (ref 70–99)
Glucose-Capillary: 125 mg/dL — ABNORMAL HIGH (ref 70–99)

## 2018-06-23 NOTE — Progress Notes (Signed)
Patient discharged to home. Verbalizes understanding of all discharge instructions including incision care, discharge medications, and follow up MD visits. Patient taken to discharge lounge awaiting ride.

## 2018-06-23 NOTE — Discharge Summary (Signed)
Physician Discharge Summary  Patient ID: Charlene Perry MRN: [id number] DOB/AGE: 66-Aug-1952 66 y.o.  Admit date: 06/21/2018 Discharge date: 06/23/2018  Admission Diagnoses: Breast cancer osa Dm  Discharge Diagnoses:  Active Problems:   Breast cancer, right [facility])   Discharged Condition: good  Hospital Course: 68 yof underwent right total mastectomy and tad for right breast cancer. She did well and remained two nights for pain control. Her drain is as expected and she is now doing well with good pain control, tol diet and ambulating.   Consults: None  Significant Diagnostic Studies: none  Treatments: surgery: right tm and tad  Discharge Exam: Blood pressure (!) 143/76, pulse 81, temperature 98.2 F (36.8 C), temperature source Oral, resp. rate 17, height 5' 6.5" (1.689 m), weight 104.3 kg, SpO2 97 %. right mastectomy flaps viable, no hematoma, drain serosang  Disposition: Discharge disposition: 01-Home or Self Care        Allergies as of 06/23/2018   No Known Allergies     Medication List    STOP taking these medications   letrozole 2.5 MG tablet Commonly known as:  [place] these medications   aspirin EC 81 MG tablet Take 81 mg by mouth daily.   celecoxib 200 MG capsule Commonly known as:  CELEBREX Take 200 mg by mouth daily.   cetirizine 10 MG tablet Commonly known as:  ZYRTEC Take 10 mg by mouth daily.   furosemide 40 MG tablet Commonly known as:  LASIX Take 80 mg by mouth daily.   gabapentin 400 MG capsule Commonly known as:  NEURONTIN Take 400 mg by mouth 2 (two) times daily.   levothyroxine 125 MCG tablet Commonly known as:  SYNTHROID, LEVOTHROID Take 125 mcg by mouth daily before breakfast.   metFORMIN 1000 MG tablet Commonly known as:  GLUCOPHAGE Take 1,000 mg by mouth 2 (two) times daily with a meal.   methocarbamol 500 MG tablet Commonly known as:  ROBAXIN Take 1 tablet (500 mg total) by mouth every 6 (six) hours as  needed for muscle spasms.   oxyCODONE 5 MG immediate release tablet Commonly known as:  Oxy IR/ROXICODONE Take 1 tablet (5 mg total) by mouth every 4 (four) hours as needed for moderate pain.   potassium chloride 10 MEQ tablet Commonly known as:  K-DUR,KLOR-CON Take 20 mEq by mouth daily.   rosuvastatin 40 MG tablet Commonly known as:  CRESTOR Take 40 mg by mouth at bedtime.      Follow-up Information    [name], MD.   Specialty:  General Surgery Contact information: [address] [place] [address] [phone]           Signed: [name] 06/23/2018, 8:01 AM

## 2018-06-23 NOTE — Discharge Instructions (Signed)
[place] [facility], [place] [phone]  MASTECTOMY: POST OP INSTRUCTIONS Take 400 mg of ibuprofen every 8 hours or 650 mg tylenol every 6 hours for next 72 hours then as needed. Use ice several times daily also. Do not restart letrozole until I call with pathology results.  Always review your discharge instruction sheet given to you by the facility where your surgery was performed. IF YOU HAVE DISABILITY OR FAMILY LEAVE FORMS, YOU MUST BRING THEM TO THE OFFICE FOR PROCESSING.   DO NOT GIVE THEM TO YOUR DOCTOR. A prescription for pain medication may be given to you upon discharge.  Take your pain medication as prescribed, if needed.  If narcotic pain medicine is not needed, then you may take acetaminophen (Tylenol), naprosyn (Alleve) or ibuprofen (Advil) as needed. 1. Take your usually prescribed medications unless otherwise directed. 2. If you need a refill on your pain medication, please contact your pharmacy.  They will contact our office to request authorization.  Prescriptions will not be filled after 5pm or on week-ends. 3. You should follow a light diet the first few days after arrival home, such as soup and crackers, etc.  Resume your normal diet the day after surgery. 4. Most patients will experience some swelling and bruising on the chest and underarm.  Ice packs will help.  Swelling and bruising can take several days to resolve. Wear the binder day and night until you return to the office.  5. It is common to experience some constipation if taking pain medication after surgery.  Increasing fluid intake and taking a stool softener (such as Colace) will usually help or prevent this problem from occurring.  A mild laxative (Milk of Magnesia or Miralax) should be taken according to package instructions if there are no bowel movements after 48 hours. 6. Unless discharge instructions indicate otherwise, leave your bandage dry and in place until your next appointment in 3-5 days.  You may  shower tomorrow at home.   You may have steri-strips (small skin tapes) in place directly over the incision.  These strips should be left on the skin for 7-10 days. If you have glue it will come off in next couple week.  Any sutures will be removed at an office visit 7. DRAINS:  If you have drains in place, it is important to keep a list of the amount of drainage produced each day in your drains.  Before leaving the hospital, you should be instructed on drain care.  Call our office if you have any questions about your drains. I will remove your drains when they put out less than 30 cc or ml for 2 consecutive days. 8. ACTIVITIES:  You may resume regular (light) daily activities beginning the next day--such as daily self-care, walking, climbing stairs--gradually increasing activities as tolerated.  You may have sexual intercourse when it is comfortable.  Refrain from any heavy lifting or straining until approved by your doctor. a. You may drive when you are no longer taking prescription pain medication, you can comfortably wear a seatbelt, and you can safely maneuver your car and apply brakes. b. RETURN TO WORK:  __________________________________________________________ 9. You should see your doctor in the office for a follow-up appointment approximately 3-5 days after your surgery.  Your doctors nurse will typically make your follow-up appointment when she calls you with your pathology report.  Expect your pathology report 3-4business days after surgery. 10. OTHER INSTRUCTIONS: ______________________________________________________________________________________________ ____________________________________________________________________________________________ WHEN TO CALL YOUR DR Joanne Brander: 1. Fever over 101.0 2.  Nausea and/or vomiting 3. Extreme swelling or bruising 4. Continued bleeding from incision. 5. Increased pain, redness, or drainage from the incision. The clinic staff is available to  answer your questions during regular business hours.  Please dont hesitate to call and ask to speak to one of the nurses for clinical concerns.  If you have a medical emergency, go to the nearest emergency room or call 911.  A surgeon from [facility] is always on call at the hospital. [address], [place], [place], [place]  [address] ? [address], [place],    [address] [phone] ? [phone] ? FAX [phone] Web site: [email or web address]

## 2018-06-24 NOTE — Progress Notes (Signed)
Patient Care Team: [name], MD as PCP - General (Family Medicine)  DIAGNOSIS:    ICD-10-CM   1. Malignant neoplasm of upper-outer quadrant of right breast in female, estrogen receptor positive ([place]) C50.411 CT Abdomen Pelvis W Contrast   Z17.0 CT Chest W Contrast    SUMMARY OF ONCOLOGIC HISTORY:   Malignant neoplasm of upper-outer quadrant of right breast in female, estrogen receptor positive ([place])   11/12/2017 Initial Diagnosis    Palpable lesion in the right breast upper outer quadrant mammogram and ultrasound revealed 1.4 cm tumor ultrasound-guided biopsy revealed grade 2 IDC ER 3+, PR 3+, HER-2 negative with a Ki-67 of 10%.  Right axillary lymph node biopsy positive, T1cN1 stage I a clinical stage    12/23/2017 Cancer Staging    Staging form: Breast, AJCC 8th Edition - Clinical: Stage IB (cT1c, cN1, cM0, G2, ER+, PR+, HER2-) - Signed by Gardenia Phlegm, NP on 12/23/2017    01/10/2018 Breast MRI    Biopsy-proven malignancy UOQ right breast measuring 6 cm.  Anterior inferior to this is a 1 cm mass which needs biopsy.  Focal non-mass enhancement LOQ retroareolar right breast, left breast 7 mm irregular enhancement, 2 suspicious right axillary lymph nodes    01/27/2018 Procedure    Right breast biopsy UOQ: Grade 1 ILC, ALH Right breast biopsy 6:00: LCIS Left breast biopsy: Benign    06/21/2018 Surgery    Rt Mastectomy: 7 cm fibrotic mass ILC Grade 2, 5/6 LN Positive, ER 90-100%, PR 5%-70%, Ki 67 2-10%, Her 2 Neg, T3N2     CHIEF COMPLIANT: Follow-up of right breast cancer s/p right mastectomy  INTERVAL HISTORY: Charlene Perry is a 68 y.o. with above-mentioned history of right breast cancer of which she is currently on anti-estrogen therapy with letrozole. She had a right total mastectomy on 06/21/18 for which pathology showed the cancer to be grade 2, HER2 negative, ER 100%, PR 5%, Ki67 1% with 2 involved lymph nodes. She presents to the clinic today with  her husband to discuss further treatment. She is concerned about chemotherapy preventing her from working in her job at a [employer].   REVIEW OF SYSTEMS:   Constitutional: Denies fevers, chills or abnormal weight loss Eyes: Denies blurriness of vision Ears, nose, mouth, throat, and face: Denies mucositis or sore throat Respiratory: Denies cough, dyspnea or wheezes Cardiovascular: Denies palpitation, chest discomfort Gastrointestinal:  Denies nausea, heartburn or change in bowel habits Skin: Denies abnormal skin rashes Lymphatics: Denies new lymphadenopathy or easy bruising Neurological: Denies numbness, tingling or new weaknesses Behavioral/Psych: Mood is stable, no new changes  Extremities: No lower extremity edema Breast: denies any pain or lumps or nodules in either breasts All other systems were reviewed with the patient and are negative.  I have reviewed the past medical history, past surgical history, social history and family history with the patient and they are unchanged from previous note.  ALLERGIES:  has No Known Allergies.  MEDICATIONS:  Current Outpatient Medications  Medication Sig Dispense Refill  . aspirin EC 81 MG tablet Take 81 mg by mouth daily.    . celecoxib (CELEBREX) 200 MG capsule Take 200 mg by mouth daily.    . cetirizine (ZYRTEC) 10 MG tablet Take 10 mg by mouth daily.    . furosemide (LASIX) 40 MG tablet Take 80 mg by mouth daily.    Marland Kitchen gabapentin (NEURONTIN) 400 MG capsule Take 400 mg by mouth 2 (two) times daily.    Marland Kitchen levothyroxine (  SYNTHROID, LEVOTHROID) 125 MCG tablet Take 125 mcg by mouth daily before breakfast.    . metFORMIN (GLUCOPHAGE) 1000 MG tablet Take 1,000 mg by mouth 2 (two) times daily with a meal.    . methocarbamol (ROBAXIN) 500 MG tablet Take 1 tablet (500 mg total) by mouth every 6 (six) hours as needed for muscle spasms. 20 tablet 0  . oxyCODONE (OXY IR/ROXICODONE) 5 MG immediate release tablet Take 1 tablet (5 mg total) by  mouth every 4 (four) hours as needed for moderate pain. 15 tablet 0  . potassium chloride (K-DUR,KLOR-CON) 10 MEQ tablet Take 20 mEq by mouth daily.     . rosuvastatin (CRESTOR) 40 MG tablet Take 40 mg by mouth at bedtime.      No current facility-administered medications for this visit.     PHYSICAL EXAMINATION: ECOG PERFORMANCE STATUS: 1 - Symptomatic but completely ambulatory  Vitals:   06/29/18 1533  BP: 135/64  Pulse: 95  Resp: 17  Temp: 98.2 F (36.8 C)  SpO2: 97%   Filed Weights   06/29/18 1533  Weight: 220 lb 1.6 oz (99.8 kg)    GENERAL: alert, no distress and comfortable SKIN: skin color, texture, turgor are normal, no rashes or significant lesions EYES: normal, Conjunctiva are pink and non-injected, sclera clear OROPHARYNX: no exudate, no erythema and lips, buccal mucosa, and tongue normal  NECK: supple, thyroid normal size, non-tender, without nodularity LYMPH: no palpable lymphadenopathy in the cervical, axillary or inguinal LUNGS: clear to auscultation and percussion with normal breathing effort HEART: regular rate & rhythm and no murmurs and no lower extremity edema ABDOMEN: abdomen soft, non-tender and normal bowel sounds MUSCULOSKELETAL: no cyanosis of digits and no clubbing  NEURO: alert & oriented x 3 with fluent speech, no focal motor/sensory deficits EXTREMITIES: No lower extremity edema  LABORATORY DATA:  I have reviewed the data as listed CMP Latest Ref Rng & Units 06/22/2018 06/14/2018  Glucose 70 - 99 mg/dL 149(H) 173(H)  BUN 8 - 23 mg/dL 17 16  Creatinine 0.44 - 1.00 mg/dL 1.20(H) 1.27(H)  Sodium 135 - 145 mmol/L 138 141  Potassium 3.5 - 5.1 mmol/L 3.8 3.6  Chloride 98 - 111 mmol/L 105 104  CO2 22 - 32 mmol/L 26 26  Calcium 8.9 - 10.3 mg/dL 8.5(L) 9.0    Lab Results  Component Value Date   WBC 6.9 06/14/2018   HGB 11.3 (L) 06/14/2018   HCT 36.1 06/14/2018   MCV 88.9 06/14/2018   PLT 228 06/14/2018    ASSESSMENT & PLAN:  Malignant  neoplasm of upper-outer quadrant of right breast in female, estrogen receptor positive ([place]) 06/21/17: Rt Mastectomy: 7 cm fibrotic mass ILC Grade 2, 5/6 LN Positive, ER 90-100%, PR 5%-70%, Ki 67 2-10%, Her 2 Neg, T3N2  Pathology counseling: I discussed the final pathology report of the patient provided  a copy of this report. I discussed the margins as well as lymph node surgeries. We also discussed the final staging along with previously performed ER/PR and HER-2/neu testing.  Recommendation: 1. Completion AXLND 2. Adj chemo 3. Adj RT 4. Adj Anti estrogen therapy 5.  Staging CT scans --------------------------------------------------------------------------------------------------------------------- Chemotherapy Counseling: I discussed the risks and benefits of chemotherapy including the risks of nausea/ vomiting, risk of infection from low WBC count, fatigue due to chemo or anemia, bruising or bleeding due to low platelets, mouth sores, loss/ change in taste and decreased appetite. Liver and kidney function will be monitored through out chemotherapy as abnormalities in liver and  kidney function may be a side effect of treatment. Cardiac dysfunction due to Adriamycin were discussed in detail. Risk of permanent bone marrow dysfunction and leukemia due to chemo were also discussed.  Psychosocial issues: Patient is distraught because she has to go through additional surgery and chemotherapy.  We provided emotional comfort and counseling.  I will reach out to our social workers and counselors to assist her as she goes through chemotherapy.  Return to clinic to begin chemo in 3 weeks or so once she heals from her axillary lymph node dissection surgery   Orders Placed This Encounter  Procedures  . CT Abdomen Pelvis W Contrast    Standing Status:   Future    Standing Expiration Date:   06/29/2019    Order Specific Question:   ** REASON FOR EXAM (FREE TEXT)    Answer:   Stage 3 breast cancer staging      Order Specific Question:   If indicated for the ordered procedure, I authorize the administration of contrast media per Radiology protocol    Answer:   Yes    Order Specific Question:   Preferred imaging location?    Answer:   [facility]    Order Specific Question:   Is Oral Contrast requested for this exam?    Answer:   Yes, Per Radiology protocol    Order Specific Question:   Radiology Contrast Protocol - do NOT remove file path    Answer:   \\[email or web address]  . CT Chest W Contrast    Standing Status:   Future    Standing Expiration Date:   06/29/2019    Order Specific Question:   ** REASON FOR EXAM (FREE TEXT)    Answer:   Stage 3 breast cancer staging    Order Specific Question:   If indicated for the ordered procedure, I authorize the administration of contrast media per Radiology protocol    Answer:   Yes    Order Specific Question:   Preferred imaging location?    Answer:   [facility]    Order Specific Question:   Radiology Contrast Protocol - do NOT remove file path    Answer:   \\[email or web address]   The patient has a good understanding of the overall plan. she agrees with it. she will call with any problems that may develop before the next visit here.  Charlene Lose, MD 06/29/2018  Julious Oka Dorshimer am acting as scribe for Dr. Nicholas Perry.  I have reviewed the above documentation for accuracy and completeness, and I agree with the above.

## 2018-06-29 ENCOUNTER — Inpatient Hospital Stay: Payer: BLUE CROSS/BLUE SHIELD | Attending: Hematology and Oncology | Admitting: Hematology and Oncology

## 2018-06-29 VITALS — BP 135/64 | HR 95 | Temp 98.2°F | Resp 17 | Ht 66.5 in | Wt 220.1 lb

## 2018-06-29 DIAGNOSIS — C50411 Malignant neoplasm of upper-outer quadrant of right female breast: Secondary | ICD-10-CM | POA: Diagnosis not present

## 2018-06-29 DIAGNOSIS — E119 Type 2 diabetes mellitus without complications: Secondary | ICD-10-CM | POA: Diagnosis not present

## 2018-06-29 DIAGNOSIS — Z79899 Other long term (current) drug therapy: Secondary | ICD-10-CM | POA: Insufficient documentation

## 2018-06-29 DIAGNOSIS — Z7984 Long term (current) use of oral hypoglycemic drugs: Secondary | ICD-10-CM | POA: Diagnosis not present

## 2018-06-29 DIAGNOSIS — Z79811 Long term (current) use of aromatase inhibitors: Secondary | ICD-10-CM | POA: Diagnosis not present

## 2018-06-29 DIAGNOSIS — Z170 Estrogen receptor positive status [ER+]: Secondary | ICD-10-CM | POA: Diagnosis not present

## 2018-06-29 DIAGNOSIS — C773 Secondary and unspecified malignant neoplasm of axilla and upper limb lymph nodes: Secondary | ICD-10-CM | POA: Diagnosis not present

## 2018-06-29 DIAGNOSIS — Z7982 Long term (current) use of aspirin: Secondary | ICD-10-CM | POA: Insufficient documentation

## 2018-06-29 MED ORDER — ONDANSETRON HCL 8 MG PO TABS: 8.0000 mg | ORAL_TABLET | Freq: Two times a day (BID) | ORAL | 1 refills | Status: DC | PRN

## 2018-06-29 MED ORDER — PROCHLORPERAZINE MALEATE 10 MG PO TABS: 10.0000 mg | ORAL_TABLET | Freq: Four times a day (QID) | ORAL | 1 refills | Status: DC | PRN

## 2018-06-29 MED ORDER — LORAZEPAM 0.5 MG PO TABS: 0.5000 mg | ORAL_TABLET | Freq: Every evening | ORAL | 0 refills | Status: DC | PRN

## 2018-06-29 MED ORDER — LIDOCAINE-PRILOCAINE 2.5-2.5 % EX CREA: TOPICAL_CREAM | CUTANEOUS | 3 refills | Status: DC

## 2018-06-29 NOTE — Assessment & Plan Note (Signed)
06/21/17: Rt Mastectomy: 7 cm fibrotic mass ILC Grade 2, 5/6 LN Positive, ER 90-100%, PR 5%-70%, Ki 67 2-10%, Her 2 Neg, T3N2  Pathology counseling: I discussed the final pathology report of the patient provided  a copy of this report. I discussed the margins as well as lymph node surgeries. We also discussed the final staging along with previously performed ER/PR and HER-2/neu testing.  Recommendation: 1. Completion AXLND 2. Adj chemo 3. Adj RT 4. Adj Anti estrogen therapy --------------------------------------------------------------------------------------------------------------------- Psychological Issues:

## 2018-06-29 NOTE — Progress Notes (Signed)
START ON PATHWAY REGIMEN - Breast   Dose-Dense AC q14 days:   A cycle is every 14 days:     Doxorubicin      Cyclophosphamide      [id number]   **Always confirm dose/schedule in your pharmacy ordering system**  Paclitaxel 80 mg/m2 Weekly:   Administer weekly:     Paclitaxel   **Always confirm dose/schedule in your pharmacy ordering system**  Patient Characteristics: Postoperative without Neoadjuvant Therapy (Pathologic Staging), Invasive Disease, Adjuvant Therapy, HER2 Negative/Unknown/Equivocal, ER Positive, Node Positive, Node Positive (4+) Therapeutic Status: Postoperative without Neoadjuvant Therapy (Pathologic Staging) AJCC Grade: G3 AJCC N Category: pN2 AJCC M Category: cM0 ER Status: Positive (+) AJCC 8 Stage Grouping: IIB HER2 Status: Negative (-) Oncotype Dx Recurrence Score: Not Appropriate AJCC T Category: pT2 PR Status: Positive (+) Intent of Therapy: Curative Intent, Discussed with Patient

## 2018-06-30 ENCOUNTER — Other Ambulatory Visit: Payer: Self-pay | Admitting: *Deleted

## 2018-06-30 DIAGNOSIS — Z170 Estrogen receptor positive status [ER+]: Principal | ICD-10-CM

## 2018-06-30 DIAGNOSIS — C50411 Malignant neoplasm of upper-outer quadrant of right female breast: Secondary | ICD-10-CM

## 2018-07-02 ENCOUNTER — Ambulatory Visit (HOSPITAL_BASED_OUTPATIENT_CLINIC_OR_DEPARTMENT_OTHER)
Admission: RE | Admit: 2018-07-02 | Discharge: 2018-07-02 | Disposition: A | Discharge status: Home / Self Care | Payer: BLUE CROSS/BLUE SHIELD | Source: Ambulatory Visit | Attending: Hematology and Oncology | Admitting: Hematology and Oncology

## 2018-07-02 ENCOUNTER — Ambulatory Visit (HOSPITAL_COMMUNITY)
Admission: RE | Admit: 2018-07-02 | Discharge: 2018-07-02 | Disposition: A | Discharge status: Home / Self Care | Payer: BLUE CROSS/BLUE SHIELD | Source: Ambulatory Visit | Attending: Hematology and Oncology | Admitting: Hematology and Oncology

## 2018-07-02 ENCOUNTER — Other Ambulatory Visit: Payer: BLUE CROSS/BLUE SHIELD

## 2018-07-02 ENCOUNTER — Other Ambulatory Visit: Payer: Self-pay

## 2018-07-02 ENCOUNTER — Telehealth: Payer: Self-pay

## 2018-07-02 DIAGNOSIS — N289 Disorder of kidney and ureter, unspecified: Secondary | ICD-10-CM

## 2018-07-02 DIAGNOSIS — C50411 Malignant neoplasm of upper-outer quadrant of right female breast: Secondary | ICD-10-CM

## 2018-07-02 DIAGNOSIS — Z170 Estrogen receptor positive status [ER+]: Principal | ICD-10-CM

## 2018-07-02 MED ORDER — IOHEXOL 300 MG/ML  SOLN
100.0000 mL | Freq: Once | INTRAMUSCULAR | Status: AC | PRN
  Administered 2018-07-02: 100 mL via INTRAVENOUS

## 2018-07-02 MED ORDER — SODIUM CHLORIDE (PF) 0.9 % IJ SOLN
INTRAMUSCULAR | Status: AC
  Filled 2018-07-02: qty 50

## 2018-07-02 NOTE — Progress Notes (Signed)
  Echocardiogram 2D Echocardiogram has been performed.  Charlene Perry 07/02/2018, 10:05 AM

## 2018-07-02 NOTE — Telephone Encounter (Signed)
Called and spoke to pt regarding MRI appt. PT given phone number to schedule appt.

## 2018-07-02 NOTE — Progress Notes (Signed)
Per Dr.Gudena,obtain MRI abdomen w/wo contrast to follow up abnormal kidney lesion.

## 2018-07-06 ENCOUNTER — Other Ambulatory Visit: Payer: Self-pay

## 2018-07-06 ENCOUNTER — Other Ambulatory Visit: Payer: Self-pay | Admitting: General Surgery

## 2018-07-06 ENCOUNTER — Inpatient Hospital Stay: Payer: BLUE CROSS/BLUE SHIELD

## 2018-07-06 MED ORDER — DIAZEPAM 5 MG PO TABS: ORAL_TABLET | ORAL | 0 refills | Status: DC

## 2018-07-06 NOTE — Progress Notes (Signed)
Pt called to request something for anxiety before she gets her MRI on Thursday. Pt is claustrophobic and will not be able to get in the MRI machine without getting medicated. Ok with Dr.Gudena to send in Valium tablet prior to MRI procedure. Pt verbalized understanding and thankful for the call.

## 2018-07-07 NOTE — Pre-Procedure Instructions (Signed)
[place]  07/07/2018      [facility] - [place], [place] - [place] [address] [place] [address] Phone: [phone] Fax: [phone]    Your procedure is scheduled on 07/13/2018.  Report to [facility] Admitting at 12:30 P.M.  Call this number if you have problems the morning of surgery:  [phone]   Remember:  Do not eat after midnight.  You may drink clear liquids until 11:30am the morning of your surgery .  Clear liquids allowed HYW:VPXTG, Juice (non-citric and without pulp), Carbonated beverages, Clear Tea, Black Coffee only and Gatorade    Take these medicines the morning of surgery with A SIP OF WATER: Cetirizine (Zyrtec) Gabapentin (Neurontin) Levothyroxine (Synthroid) Methocarbamol (Robaxin) - if needed Tramadol (Ultram) - if needed  7 days prior to surgery STOP taking any celecoxib (Celebrex),  Aspirin (unless otherwise instructed by your surgeon) or Aspirin containing products, Aleve, Naproxen, Ibuprofen, Motrin, Advil, Goody's, BC's, all herbal medications, fish oil, and all vitamins.  Follow your surgeon's instructions on when to stop Asprin.  If no instructions were given by your surgeon then you will need to call the office to get those instructions.     WHAT DO I DO ABOUT MY DIABETES MEDICATION?  Marland Kitchen Do not take oral diabetes medicines (pills) the morning of surgery. - DO NOT take your metformin the morning of surgery    How to Manage Your Diabetes Before and After Surgery  Why is it important to control my blood sugar before and after surgery? . Improving blood sugar levels before and after surgery helps healing and can limit problems. . A way of improving blood sugar control is eating a healthy diet by: o  Eating less sugar and carbohydrates o  Increasing activity/exercise o  Talking with your doctor about reaching your blood sugar goals . High blood sugars (greater than 180 mg/dL) can  raise your risk of infections and slow your recovery, so you will need to focus on controlling your diabetes during the weeks before surgery. . Make sure that the doctor who takes care of your diabetes knows about your planned surgery including the date and location.  How do I manage my blood sugar before surgery? . Check your blood sugar at least 4 times a day, starting 2 days before surgery, to make sure that the level is not too high or low. o Check your blood sugar the morning of your surgery when you wake up and every 2 hours until you get to the Short Stay unit. . If your blood sugar is less than 70 mg/dL, you will need to treat for low blood sugar: o Do not take insulin. o Treat a low blood sugar (less than 70 mg/dL) with  cup of clear juice (cranberry or apple), 4 glucose tablets, OR glucose gel. o Recheck blood sugar in 15 minutes after treatment (to make sure it is greater than 70 mg/dL). If your blood sugar is not greater than 70 mg/dL on recheck, call [phone] for further instructions. . Report your blood sugar to the short stay nurse when you get to Short Stay.  . If you are admitted to the hospital after surgery: o Your blood sugar will be checked by the staff and you will probably be given insulin after surgery (instead of oral diabetes medicines) to make sure you have good blood sugar levels. o The goal for blood sugar control after surgery is 80-180 mg/dL.  Do not wear jewelry, make-up or nail polish.  Do not wear lotions, powders, or perfumes, or deodorant.  Do not shave 48 hours prior to surgery.  Men may shave face and neck.  Do not bring valuables to the hospital.  [facility] is not responsible for any belongings or valuables.  Contacts, eyeglasses, hearing aids, dentures or bridgework may not be worn into surgery.  Leave your suitcase in the car.  After surgery it may be brought to your room.  For patients admitted to the hospital, discharge time will be  determined by your treatment team.  Patients discharged the day of surgery will not be allowed to drive home.   Name and phone number of your driver:    Special instructions:   [place]- Preparing For Surgery  Before surgery, you can play an important role. Because skin is not sterile, your skin needs to be as free of germs as possible. You can reduce the number of germs on your skin by washing with CHG (chlorahexidine gluconate) Soap before surgery.  CHG is an antiseptic cleaner which kills germs and bonds with the skin to continue killing germs even after washing.    Oral Hygiene is also important to reduce your risk of infection.  Remember - BRUSH YOUR TEETH THE MORNING OF SURGERY WITH YOUR REGULAR TOOTHPASTE  Please do not use if you have an allergy to CHG or antibacterial soaps. If your skin becomes reddened/irritated stop using the CHG.  Do not shave (including legs and underarms) for at least 48 hours prior to first CHG shower. It is OK to shave your face.  Please follow these instructions carefully.   1. Shower the NIGHT BEFORE SURGERY and the MORNING OF SURGERY with CHG.   2. If you chose to wash your hair, wash your hair first as usual with your normal shampoo.  3. After you shampoo, rinse your hair and body thoroughly to remove the shampoo.  4. Use CHG as you would any other liquid soap. You can apply CHG directly to the skin and wash gently with a scrungie or a clean washcloth.   5. Apply the CHG Soap to your body ONLY FROM THE NECK DOWN.  Do not use on open wounds or open sores. Avoid contact with your eyes, ears, mouth and genitals (private parts). Wash Face and genitals (private parts)  with your normal soap.  6. Wash thoroughly, paying special attention to the area where your surgery will be performed.  7. Thoroughly rinse your body with warm water from the neck down.  8. DO NOT shower/wash with your normal soap after using and rinsing off the CHG Soap.  9. Pat  yourself dry with a CLEAN TOWEL.  10. Wear CLEAN PAJAMAS to bed the night before surgery, wear comfortable clothes the morning of surgery  11. Place CLEAN SHEETS on your bed the night of your first shower and DO NOT SLEEP WITH PETS.    Day of Surgery: Shower as stated above. Do not apply any deodorants/lotions.  Please wear clean clothes to the hospital/surgery center.   Remember to brush your teeth WITH YOUR REGULAR TOOTHPASTE.    Please read over the following fact sheets that you were given.

## 2018-07-08 ENCOUNTER — Encounter (HOSPITAL_COMMUNITY)
Admission: RE | Admit: 2018-07-08 | Discharge: 2018-07-08 | Disposition: A | Discharge status: Home / Self Care | Payer: BLUE CROSS/BLUE SHIELD | Source: Ambulatory Visit | Attending: General Surgery | Admitting: General Surgery

## 2018-07-08 ENCOUNTER — Ambulatory Visit (HOSPITAL_COMMUNITY): Payer: BLUE CROSS/BLUE SHIELD

## 2018-07-08 ENCOUNTER — Encounter (HOSPITAL_COMMUNITY): Payer: Self-pay

## 2018-07-08 ENCOUNTER — Ambulatory Visit (HOSPITAL_COMMUNITY)
Admission: RE | Admit: 2018-07-08 | Discharge: 2018-07-08 | Disposition: A | Discharge status: Home / Self Care | Payer: BLUE CROSS/BLUE SHIELD | Source: Ambulatory Visit | Attending: Hematology and Oncology | Admitting: Hematology and Oncology

## 2018-07-08 ENCOUNTER — Other Ambulatory Visit: Payer: Self-pay

## 2018-07-08 DIAGNOSIS — Z01812 Encounter for preprocedural laboratory examination: Secondary | ICD-10-CM | POA: Insufficient documentation

## 2018-07-08 DIAGNOSIS — N289 Disorder of kidney and ureter, unspecified: Secondary | ICD-10-CM | POA: Insufficient documentation

## 2018-07-08 DIAGNOSIS — Z170 Estrogen receptor positive status [ER+]: Secondary | ICD-10-CM | POA: Diagnosis present

## 2018-07-08 DIAGNOSIS — C50411 Malignant neoplasm of upper-outer quadrant of right female breast: Secondary | ICD-10-CM | POA: Diagnosis present

## 2018-07-08 LAB — CBC
HCT: 35.4 % — ABNORMAL LOW (ref 36.0–46.0)
HEMOGLOBIN: 11 g/dL — AB (ref 12.0–15.0)
MCH: 26.6 pg (ref 26.0–34.0)
MCHC: 31.1 g/dL (ref 30.0–36.0)
MCV: 85.7 fL (ref 80.0–100.0)
Platelets: 271 10*3/uL (ref 150–400)
RBC: 4.13 MIL/uL (ref 3.87–5.11)
RDW: 13.3 % (ref 11.5–15.5)
WBC: 8.1 10*3/uL (ref 4.0–10.5)
nRBC: 0 % (ref 0.0–0.2)

## 2018-07-08 LAB — GLUCOSE, CAPILLARY: Glucose-Capillary: 101 mg/dL — ABNORMAL HIGH (ref 70–99)

## 2018-07-08 MED ORDER — ENSURE PRE-SURGERY PO LIQD: 296.0000 mL | Freq: Once | ORAL | Status: DC

## 2018-07-08 MED ORDER — GADOBUTROL 1 MMOL/ML IV SOLN
10.0000 mL | Freq: Once | INTRAVENOUS | Status: AC | PRN
  Administered 2018-07-08: 10 mL via INTRAVENOUS

## 2018-07-08 NOTE — Progress Notes (Signed)
PCP - Dr. Yong Channel Cardiologist - denies  Chest x-ray - N/A EKG - 06/14/18 Stress Test - denies ECHO - 07/02/18 Cardiac Cath - denies  Sleep Study - "several years ago at [facility]" CPAP - uses QHS- pressure setting 11   Fasting Blood Sugar - 120's Checks Blood Sugar 2 times a day   Aspirin Instructions: Patient instructed to hold all Aspirin, NSAID's, herbal medications, fish oil and vitamins 7 days prior to surgery.   Anesthesia review: no  Patient denies shortness of breath, fever, cough and chest pain at PAT appointment   Patient verbalized understanding of instructions that were given to them at the PAT appointment. Patient was also instructed that they will need to review over the PAT instructions again at home before surgery.

## 2018-07-09 ENCOUNTER — Telehealth: Payer: Self-pay | Admitting: Hematology and Oncology

## 2018-07-09 ENCOUNTER — Other Ambulatory Visit: Payer: Self-pay | Admitting: Hematology and Oncology

## 2018-07-09 DIAGNOSIS — N289 Disorder of kidney and ureter, unspecified: Secondary | ICD-10-CM

## 2018-07-09 NOTE — Telephone Encounter (Signed)
Informed patient of the kidney lesion. Recommended Urology eval Will send the referral.

## 2018-07-13 ENCOUNTER — Observation Stay (HOSPITAL_COMMUNITY)
Admission: RE | Admit: 2018-07-13 | Discharge: 2018-07-14 | Disposition: A | Discharge status: Home / Self Care | Payer: BLUE CROSS/BLUE SHIELD | Attending: General Surgery | Admitting: General Surgery

## 2018-07-13 ENCOUNTER — Ambulatory Visit (HOSPITAL_COMMUNITY): Payer: BLUE CROSS/BLUE SHIELD | Admitting: Anesthesiology

## 2018-07-13 ENCOUNTER — Encounter (HOSPITAL_COMMUNITY): Payer: Self-pay | Admitting: *Deleted

## 2018-07-13 ENCOUNTER — Encounter (HOSPITAL_COMMUNITY)
Admission: RE | Disposition: A | Discharge status: Home / Self Care | Payer: Self-pay | Source: Home / Self Care | Attending: General Surgery

## 2018-07-13 ENCOUNTER — Observation Stay (HOSPITAL_COMMUNITY): Payer: BLUE CROSS/BLUE SHIELD

## 2018-07-13 ENCOUNTER — Ambulatory Visit (HOSPITAL_COMMUNITY): Payer: BLUE CROSS/BLUE SHIELD | Admitting: Vascular Surgery

## 2018-07-13 DIAGNOSIS — Z79890 Hormone replacement therapy (postmenopausal): Secondary | ICD-10-CM | POA: Diagnosis not present

## 2018-07-13 DIAGNOSIS — Z791 Long term (current) use of non-steroidal anti-inflammatories (NSAID): Secondary | ICD-10-CM | POA: Diagnosis not present

## 2018-07-13 DIAGNOSIS — C50411 Malignant neoplasm of upper-outer quadrant of right female breast: Principal | ICD-10-CM | POA: Insufficient documentation

## 2018-07-13 DIAGNOSIS — Z9011 Acquired absence of right breast and nipple: Secondary | ICD-10-CM | POA: Insufficient documentation

## 2018-07-13 DIAGNOSIS — Z79811 Long term (current) use of aromatase inhibitors: Secondary | ICD-10-CM | POA: Insufficient documentation

## 2018-07-13 DIAGNOSIS — C773 Secondary and unspecified malignant neoplasm of axilla and upper limb lymph nodes: Secondary | ICD-10-CM | POA: Diagnosis not present

## 2018-07-13 DIAGNOSIS — Z79899 Other long term (current) drug therapy: Secondary | ICD-10-CM | POA: Insufficient documentation

## 2018-07-13 DIAGNOSIS — Z7984 Long term (current) use of oral hypoglycemic drugs: Secondary | ICD-10-CM | POA: Diagnosis not present

## 2018-07-13 DIAGNOSIS — F419 Anxiety disorder, unspecified: Secondary | ICD-10-CM | POA: Diagnosis not present

## 2018-07-13 DIAGNOSIS — I10 Essential (primary) hypertension: Secondary | ICD-10-CM | POA: Diagnosis not present

## 2018-07-13 DIAGNOSIS — E119 Type 2 diabetes mellitus without complications: Secondary | ICD-10-CM | POA: Diagnosis not present

## 2018-07-13 DIAGNOSIS — Z803 Family history of malignant neoplasm of breast: Secondary | ICD-10-CM | POA: Insufficient documentation

## 2018-07-13 DIAGNOSIS — Z7982 Long term (current) use of aspirin: Secondary | ICD-10-CM | POA: Insufficient documentation

## 2018-07-13 DIAGNOSIS — Z538 Procedure and treatment not carried out for other reasons: Secondary | ICD-10-CM | POA: Diagnosis not present

## 2018-07-13 DIAGNOSIS — R0602 Shortness of breath: Secondary | ICD-10-CM

## 2018-07-13 DIAGNOSIS — C50911 Malignant neoplasm of unspecified site of right female breast: Secondary | ICD-10-CM | POA: Diagnosis present

## 2018-07-13 HISTORY — DX: Other mucopurulent conjunctivitis, left eye: H10.022

## 2018-07-13 HISTORY — PX: AXILLARY LYMPH NODE DISSECTION: SHX5229

## 2018-07-13 HISTORY — PX: PORTACATH PLACEMENT: SHX2246

## 2018-07-13 LAB — GLUCOSE, CAPILLARY
GLUCOSE-CAPILLARY: 105 mg/dL — AB (ref 70–99)
Glucose-Capillary: 305 mg/dL — ABNORMAL HIGH (ref 70–99)
Glucose-Capillary: 154 mg/dL — ABNORMAL HIGH (ref 70–99)
Glucose-Capillary: 103 mg/dL — ABNORMAL HIGH (ref 70–99)
Glucose-Capillary: 272 mg/dL — ABNORMAL HIGH (ref 70–99)

## 2018-07-13 SURGERY — INSERTION, TUNNELED CENTRAL VENOUS DEVICE, WITH PORT
Anesthesia: Regional | Laterality: Right

## 2018-07-13 MED ORDER — INSULIN ASPART 100 UNIT/ML ~~LOC~~ SOLN
0.0000 [IU] | Freq: Three times a day (TID) | SUBCUTANEOUS | Status: DC
  Administered 2018-07-14: 2 [IU] via SUBCUTANEOUS

## 2018-07-13 MED ORDER — MORPHINE SULFATE (PF) 2 MG/ML IV SOLN: 1.0000 mg | INTRAVENOUS | Status: DC | PRN

## 2018-07-13 MED ORDER — MIDAZOLAM HCL 2 MG/2ML IJ SOLN
2.0000 mg | Freq: Once | INTRAMUSCULAR | Status: AC
  Administered 2018-07-13: 2 mg via INTRAVENOUS
  Filled 2018-07-13: qty 2

## 2018-07-13 MED ORDER — LORATADINE 10 MG PO TABS
10.0000 mg | ORAL_TABLET | Freq: Every day | ORAL | Status: DC
  Administered 2018-07-14: 10 mg via ORAL
  Filled 2018-07-13: qty 1

## 2018-07-13 MED ORDER — ONDANSETRON HCL 4 MG/2ML IJ SOLN: 4.0000 mg | Freq: Four times a day (QID) | INTRAMUSCULAR | Status: DC | PRN

## 2018-07-13 MED ORDER — SODIUM CHLORIDE 0.9 % IV SOLN
INTRAVENOUS | Status: DC
  Administered 2018-07-13: 20:00:00 via INTRAVENOUS

## 2018-07-13 MED ORDER — MIDAZOLAM HCL 2 MG/2ML IJ SOLN
INTRAMUSCULAR | Status: AC
  Administered 2018-07-13: 2 mg via INTRAVENOUS
  Filled 2018-07-13: qty 2

## 2018-07-13 MED ORDER — ACETAMINOPHEN 500 MG PO TABS
1000.0000 mg | ORAL_TABLET | ORAL | Status: AC
  Administered 2018-07-13: 1000 mg via ORAL

## 2018-07-13 MED ORDER — GABAPENTIN 100 MG PO CAPS
ORAL_CAPSULE | ORAL | Status: AC
  Filled 2018-07-13: qty 1

## 2018-07-13 MED ORDER — PROPOFOL 10 MG/ML IV BOLUS
INTRAVENOUS | Status: DC | PRN
  Administered 2018-07-13: 100 mg via INTRAVENOUS

## 2018-07-13 MED ORDER — BUPIVACAINE HCL (PF) 0.25 % IJ SOLN
INTRAMUSCULAR | Status: AC
  Filled 2018-07-13: qty 10

## 2018-07-13 MED ORDER — ACETAMINOPHEN 500 MG PO TABS
ORAL_TABLET | ORAL | Status: AC
  Administered 2018-07-13: 1000 mg via ORAL
  Filled 2018-07-13: qty 2

## 2018-07-13 MED ORDER — CEFAZOLIN SODIUM-DEXTROSE 2-4 GM/100ML-% IV SOLN
INTRAVENOUS | Status: AC
  Filled 2018-07-13: qty 100

## 2018-07-13 MED ORDER — BUPIVACAINE HCL (PF) 0.5 % IJ SOLN
INTRAMUSCULAR | Status: DC | PRN
  Administered 2018-07-13: 30 mg via PERINEURAL

## 2018-07-13 MED ORDER — EPINEPHRINE PF 1 MG/10ML IJ SOSY
PREFILLED_SYRINGE | INTRAMUSCULAR | Status: DC | PRN
  Administered 2018-07-13: 100 ug via SUBCUTANEOUS

## 2018-07-13 MED ORDER — FENTANYL CITRATE (PF) 250 MCG/5ML IJ SOLN
INTRAMUSCULAR | Status: AC
  Filled 2018-07-13: qty 5

## 2018-07-13 MED ORDER — FUROSEMIDE 80 MG PO TABS
80.0000 mg | ORAL_TABLET | Freq: Every day | ORAL | Status: DC
  Administered 2018-07-14: 80 mg via ORAL
  Filled 2018-07-13: qty 1

## 2018-07-13 MED ORDER — HEPARIN SOD (PORK) LOCK FLUSH 100 UNIT/ML IV SOLN
INTRAVENOUS | Status: AC
  Filled 2018-07-13: qty 5

## 2018-07-13 MED ORDER — ACETAMINOPHEN 500 MG PO TABS
1000.0000 mg | ORAL_TABLET | Freq: Four times a day (QID) | ORAL | Status: DC
  Administered 2018-07-13 – 2018-07-14 (×3): 1000 mg via ORAL
  Filled 2018-07-13 (×3): qty 2

## 2018-07-13 MED ORDER — BUPIVACAINE-EPINEPHRINE (PF) 0.25% -1:200000 IJ SOLN
INTRAMUSCULAR | Status: AC
  Filled 2018-07-13: qty 10

## 2018-07-13 MED ORDER — SODIUM CHLORIDE 0.9 % IV SOLN
INTRAVENOUS | Status: DC | PRN
  Administered 2018-07-13: 25 ug/min via INTRAVENOUS

## 2018-07-13 MED ORDER — CLONIDINE HCL (ANALGESIA) 100 MCG/ML EP SOLN
EPIDURAL | Status: DC | PRN
  Administered 2018-07-13: 50 ug

## 2018-07-13 MED ORDER — LEVOTHYROXINE SODIUM 25 MCG PO TABS
125.0000 ug | ORAL_TABLET | Freq: Every day | ORAL | Status: DC
  Administered 2018-07-14: 125 ug via ORAL
  Filled 2018-07-13: qty 1

## 2018-07-13 MED ORDER — BUPIVACAINE HCL 0.25 % IJ SOLN
INTRAMUSCULAR | Status: DC | PRN
  Administered 2018-07-13: 20 mL

## 2018-07-13 MED ORDER — CEFAZOLIN SODIUM-DEXTROSE 2-4 GM/100ML-% IV SOLN
2.0000 g | INTRAVENOUS | Status: AC
  Administered 2018-07-13: 2 g via INTRAVENOUS

## 2018-07-13 MED ORDER — LACTATED RINGERS IV SOLN
INTRAVENOUS | Status: DC
  Administered 2018-07-13 (×2): via INTRAVENOUS

## 2018-07-13 MED ORDER — BUPIVACAINE-EPINEPHRINE (PF) 0.25% -1:200000 IJ SOLN
INTRAMUSCULAR | Status: AC
  Filled 2018-07-13: qty 20

## 2018-07-13 MED ORDER — ONDANSETRON 4 MG PO TBDP: 4.0000 mg | ORAL_TABLET | Freq: Four times a day (QID) | ORAL | Status: DC | PRN

## 2018-07-13 MED ORDER — FENTANYL CITRATE (PF) 100 MCG/2ML IJ SOLN
INTRAMUSCULAR | Status: AC
  Filled 2018-07-13: qty 2

## 2018-07-13 MED ORDER — GABAPENTIN 400 MG PO CAPS
400.0000 mg | ORAL_CAPSULE | Freq: Two times a day (BID) | ORAL | Status: DC
  Administered 2018-07-13 – 2018-07-14 (×2): 400 mg via ORAL
  Filled 2018-07-13 (×2): qty 1

## 2018-07-13 MED ORDER — FENTANYL CITRATE (PF) 100 MCG/2ML IJ SOLN
INTRAMUSCULAR | Status: DC | PRN
  Administered 2018-07-13: 100 ug via INTRAVENOUS
  Administered 2018-07-13: 50 ug via INTRAVENOUS

## 2018-07-13 MED ORDER — ONDANSETRON HCL 4 MG/2ML IJ SOLN
INTRAMUSCULAR | Status: DC | PRN
  Administered 2018-07-13: 4 mg via INTRAVENOUS

## 2018-07-13 MED ORDER — PROCHLORPERAZINE EDISYLATE 10 MG/2ML IJ SOLN: 5.0000 mg | Freq: Four times a day (QID) | INTRAMUSCULAR | Status: DC | PRN

## 2018-07-13 MED ORDER — PHENYLEPHRINE 40 MCG/ML (10ML) SYRINGE FOR IV PUSH (FOR BLOOD PRESSURE SUPPORT)
PREFILLED_SYRINGE | INTRAVENOUS | Status: DC | PRN
  Administered 2018-07-13: 80 ug via INTRAVENOUS
  Administered 2018-07-13 (×2): 120 ug via INTRAVENOUS
  Administered 2018-07-13: 80 ug via INTRAVENOUS

## 2018-07-13 MED ORDER — INSULIN ASPART 100 UNIT/ML ~~LOC~~ SOLN
0.0000 [IU] | Freq: Every day | SUBCUTANEOUS | Status: DC
  Administered 2018-07-13: 3 [IU] via SUBCUTANEOUS

## 2018-07-13 MED ORDER — POTASSIUM CHLORIDE CRYS ER 20 MEQ PO TBCR
20.0000 meq | EXTENDED_RELEASE_TABLET | Freq: Every day | ORAL | Status: DC
  Administered 2018-07-14: 20 meq via ORAL
  Filled 2018-07-13: qty 1

## 2018-07-13 MED ORDER — ASPIRIN EC 81 MG PO TBEC
81.0000 mg | DELAYED_RELEASE_TABLET | Freq: Every day | ORAL | Status: DC
  Administered 2018-07-14: 81 mg via ORAL
  Filled 2018-07-13: qty 1

## 2018-07-13 MED ORDER — MIDAZOLAM HCL 2 MG/2ML IJ SOLN
INTRAMUSCULAR | Status: AC
  Filled 2018-07-13: qty 2

## 2018-07-13 MED ORDER — ENOXAPARIN SODIUM 40 MG/0.4ML ~~LOC~~ SOLN
40.0000 mg | SUBCUTANEOUS | Status: DC
  Administered 2018-07-14: 40 mg via SUBCUTANEOUS
  Filled 2018-07-13: qty 0.4

## 2018-07-13 MED ORDER — SIMETHICONE 80 MG PO CHEW: 40.0000 mg | CHEWABLE_TABLET | Freq: Four times a day (QID) | ORAL | Status: DC | PRN

## 2018-07-13 MED ORDER — GABAPENTIN 100 MG PO CAPS: 100.0000 mg | ORAL_CAPSULE | ORAL | Status: DC

## 2018-07-13 MED ORDER — SUGAMMADEX SODIUM 200 MG/2ML IV SOLN
INTRAVENOUS | Status: DC | PRN
  Administered 2018-07-13: 200 mg via INTRAVENOUS

## 2018-07-13 MED ORDER — HEPARIN SOD (PORK) LOCK FLUSH 100 UNIT/ML IV SOLN
INTRAVENOUS | Status: DC | PRN
  Administered 2018-07-13: 500 [IU] via INTRAVENOUS

## 2018-07-13 MED ORDER — 0.9 % SODIUM CHLORIDE (POUR BTL) OPTIME
TOPICAL | Status: DC | PRN
  Administered 2018-07-13: 1000 mL

## 2018-07-13 MED ORDER — SODIUM CHLORIDE 0.9 % IV SOLN
INTRAVENOUS | Status: DC | PRN
  Administered 2018-07-13: 15:00:00

## 2018-07-13 MED ORDER — TRAMADOL HCL 50 MG PO TABS: 50.0000 mg | ORAL_TABLET | Freq: Four times a day (QID) | ORAL | Status: DC | PRN

## 2018-07-13 MED ORDER — ROCURONIUM BROMIDE 10 MG/ML (PF) SYRINGE
PREFILLED_SYRINGE | INTRAVENOUS | Status: DC | PRN
  Administered 2018-07-13: 50 mg via INTRAVENOUS

## 2018-07-13 MED ORDER — SODIUM CHLORIDE 0.9 % IV SOLN
INTRAVENOUS | Status: AC
  Filled 2018-07-13: qty 1.2

## 2018-07-13 MED ORDER — ROSUVASTATIN CALCIUM 20 MG PO TABS
40.0000 mg | ORAL_TABLET | Freq: Every day | ORAL | Status: DC
  Administered 2018-07-13: 40 mg via ORAL
  Filled 2018-07-13: qty 2

## 2018-07-13 MED ORDER — DEXAMETHASONE SODIUM PHOSPHATE 4 MG/ML IJ SOLN
INTRAMUSCULAR | Status: DC | PRN
  Administered 2018-07-13: 4 mg via INTRAVENOUS

## 2018-07-13 MED ORDER — METHOCARBAMOL 500 MG PO TABS
500.0000 mg | ORAL_TABLET | Freq: Three times a day (TID) | ORAL | Status: DC
  Administered 2018-07-13 – 2018-07-14 (×2): 500 mg via ORAL
  Filled 2018-07-13 (×2): qty 1

## 2018-07-13 MED ORDER — PROCHLORPERAZINE MALEATE 10 MG PO TABS
10.0000 mg | ORAL_TABLET | Freq: Four times a day (QID) | ORAL | Status: DC | PRN
  Filled 2018-07-13: qty 1

## 2018-07-13 MED ORDER — OXYCODONE HCL 5 MG PO TABS
5.0000 mg | ORAL_TABLET | ORAL | Status: DC | PRN
  Administered 2018-07-13 – 2018-07-14 (×3): 10 mg via ORAL
  Filled 2018-07-13 (×3): qty 2

## 2018-07-13 MED ORDER — FENTANYL CITRATE (PF) 100 MCG/2ML IJ SOLN: 25.0000 ug | INTRAMUSCULAR | Status: DC | PRN

## 2018-07-13 SURGICAL SUPPLY — 76 items
ADH SKN CLS APL DERMABOND .7 (GAUZE/BANDAGES/DRESSINGS) ×2
ADH SKN CLS LQ APL DERMABOND (GAUZE/BANDAGES/DRESSINGS) ×4
APPLIER CLIP 9.375 MED OPEN (MISCELLANEOUS) ×8
APR CLP MED 9.3 20 MLT OPN (MISCELLANEOUS) ×4
BAG DECANTER FOR FLEXI CONT (MISCELLANEOUS) ×4 IMPLANT
BIOPATCH RED 1 DISK 7.0 (GAUZE/BANDAGES/DRESSINGS) ×3 IMPLANT
BIOPATCH RED 1IN DISK 7.0MM (GAUZE/BANDAGES/DRESSINGS) ×1
BIOPATCH WHT 1IN DISK W/4.0 H (GAUZE/BANDAGES/DRESSINGS) ×2 IMPLANT
BLADE CLIPPER SURG (BLADE) IMPLANT
CANISTER SUCT 3000ML PPV (MISCELLANEOUS) ×4 IMPLANT
CHLORAPREP W/TINT 10.5 ML (MISCELLANEOUS) ×4 IMPLANT
CHLORAPREP W/TINT 26ML (MISCELLANEOUS) ×4 IMPLANT
CLIP APPLIE 9.375 MED OPEN (MISCELLANEOUS) ×2 IMPLANT
CLOSURE WOUND 1/2 X4 (GAUZE/BANDAGES/DRESSINGS) ×1
CONT SPEC 4OZ CLIKSEAL STRL BL (MISCELLANEOUS) IMPLANT
COVER SURGICAL LIGHT HANDLE (MISCELLANEOUS) ×4 IMPLANT
COVER TRANSDUCER ULTRASND GEL (DRAPE) ×4 IMPLANT
COVER WAND RF STERILE (DRAPES) ×4 IMPLANT
CRADLE DONUT ADULT HEAD (MISCELLANEOUS) ×4 IMPLANT
DECANTER SPIKE VIAL GLASS SM (MISCELLANEOUS) ×4 IMPLANT
DERMABOND ADHESIVE PROPEN (GAUZE/BANDAGES/DRESSINGS) ×4
DERMABOND ADVANCED (GAUZE/BANDAGES/DRESSINGS) ×2
DERMABOND ADVANCED .7 DNX12 (GAUZE/BANDAGES/DRESSINGS) ×2 IMPLANT
DERMABOND ADVANCED .7 DNX6 (GAUZE/BANDAGES/DRESSINGS) IMPLANT
DRAIN CHANNEL 19F RND (DRAIN) ×4 IMPLANT
DRAPE C-ARM 42X72 X-RAY (DRAPES) ×4 IMPLANT
DRAPE CHEST BREAST 15X10 FENES (DRAPES) ×4 IMPLANT
DRSG TEGADERM 4X4.75 (GAUZE/BANDAGES/DRESSINGS) ×4 IMPLANT
ELECT CAUTERY BLADE 6.4 (BLADE) ×4 IMPLANT
ELECT REM PT RETURN 9FT ADLT (ELECTROSURGICAL) ×4
ELECTRODE REM PT RTRN 9FT ADLT (ELECTROSURGICAL) ×2 IMPLANT
EVACUATOR SILICONE 100CC (DRAIN) ×4 IMPLANT
GAUZE 4X4 16PLY RFD (DISPOSABLE) ×4 IMPLANT
GEL ULTRASOUND 20GR AQUASONIC (MISCELLANEOUS) ×4 IMPLANT
GLOVE BIO SURGEON STRL SZ7 (GLOVE) ×4 IMPLANT
GLOVE BIOGEL PI IND STRL 7.5 (GLOVE) ×2 IMPLANT
GLOVE BIOGEL PI INDICATOR 7.5 (GLOVE) ×2
GOWN STRL REUS W/ TWL LRG LVL3 (GOWN DISPOSABLE) ×4 IMPLANT
GOWN STRL REUS W/TWL LRG LVL3 (GOWN DISPOSABLE) ×8
INTRODUCER COOK 11FR (CATHETERS) IMPLANT
KIT BASIN OR (CUSTOM PROCEDURE TRAY) ×4 IMPLANT
KIT PORT POWER 8FR ISP CVUE (Port) ×2 IMPLANT
KIT TURNOVER KIT B (KITS) ×4 IMPLANT
LIGHT WAVEGUIDE WIDE FLAT (MISCELLANEOUS) ×2 IMPLANT
NDL HYPO 25GX1X1/2 BEV (NEEDLE) IMPLANT
NEEDLE HYPO 25GX1X1/2 BEV (NEEDLE) IMPLANT
NS IRRIG 1000ML POUR BTL (IV SOLUTION) ×4 IMPLANT
PACK GENERAL/GYN (CUSTOM PROCEDURE TRAY) ×4 IMPLANT
PAD ARMBOARD 7.5X6 YLW CONV (MISCELLANEOUS) ×8 IMPLANT
PENCIL BUTTON HOLSTER BLD 10FT (ELECTRODE) ×4 IMPLANT
PENCIL SMOKE EVACUATOR (MISCELLANEOUS) ×4 IMPLANT
SET INTRODUCER 12FR PACEMAKER (INTRODUCER) IMPLANT
SET SHEATH INTRODUCER 10FR (MISCELLANEOUS) IMPLANT
SHEATH COOK PEEL AWAY SET 9F (SHEATH) IMPLANT
STRIP CLOSURE SKIN 1/2X4 (GAUZE/BANDAGES/DRESSINGS) ×3 IMPLANT
SUT ETHILON 2 0 FS 18 (SUTURE) ×6 IMPLANT
SUT MNCRL AB 3-0 PS2 18 (SUTURE) ×2 IMPLANT
SUT MNCRL AB 4-0 PS2 18 (SUTURE) ×4 IMPLANT
SUT PROLENE 2 0 SH DA (SUTURE) ×4 IMPLANT
SUT SILK 2 0 (SUTURE)
SUT SILK 2-0 18XBRD TIE 12 (SUTURE) IMPLANT
SUT SILK 3 0 (SUTURE) ×4
SUT SILK 3-0 18XBRD TIE 12 (SUTURE) ×2 IMPLANT
SUT VIC AB 2-0 SH 27 (SUTURE) ×4
SUT VIC AB 2-0 SH 27X BRD (SUTURE) ×2 IMPLANT
SUT VIC AB 3-0 SH 27 (SUTURE) ×8
SUT VIC AB 3-0 SH 27X BRD (SUTURE) ×2 IMPLANT
SUT VIC AB 3-0 SH 27XBRD (SUTURE) ×2 IMPLANT
SUT VIC AB 3-0 SH 8-18 (SUTURE) ×2 IMPLANT
SYR 5ML LUER SLIP (SYRINGE) ×4 IMPLANT
SYR CONTROL 10ML LL (SYRINGE) IMPLANT
TAPE STRIPS DRAPE STRL (GAUZE/BANDAGES/DRESSINGS) ×2 IMPLANT
TOWEL OR 17X24 6PK STRL BLUE (TOWEL DISPOSABLE) ×4 IMPLANT
TOWEL OR 17X26 10 PK STRL BLUE (TOWEL DISPOSABLE) ×4 IMPLANT
TRAY LAPAROSCOPIC MC (CUSTOM PROCEDURE TRAY) ×4 IMPLANT
YANKAUER SUCT BULB TIP NO VENT (SUCTIONS) ×2 IMPLANT

## 2018-07-13 NOTE — Anesthesia Procedure Notes (Signed)
Procedure Name: Intubation Date/Time: 07/13/2018 3:07 PM Performed by: Jenne Campus, CRNA Pre-anesthesia Checklist: Patient identified, Emergency Drugs available, Suction available and Patient being monitored Patient Re-evaluated:Patient Re-evaluated prior to induction Oxygen Delivery Method: Circle System Utilized Preoxygenation: Pre-oxygenation with 100% oxygen Induction Type: IV induction and Cricoid Pressure applied Ventilation: Mask ventilation without difficulty Laryngoscope Size: Miller and 2 Grade View: Grade II Tube type: Oral Tube size: 7.0 mm Number of attempts: 1 Airway Equipment and Method: Stylet and Oral airway Placement Confirmation: ETT inserted through vocal cords under direct vision,  positive ETCO2 and breath sounds checked- equal and bilateral Secured at: 24 cm Tube secured with: Tape Dental Injury: Teeth and Oropharynx as per pre-operative assessment

## 2018-07-13 NOTE — Anesthesia Preprocedure Evaluation (Addendum)
Anesthesia Evaluation  Patient identified by MRN, date of birth, ID band Patient awake    Reviewed: Allergy & Precautions, NPO status , Patient's Chart, lab work & pertinent test results  Airway Mallampati: III  TM Distance: >3 FB Neck ROM: Full    Dental no notable dental hx. (+) Teeth Intact, Dental Advisory Given   Pulmonary sleep apnea ,    Pulmonary exam normal breath sounds clear to auscultation       Cardiovascular hypertension, negative cardio ROS Normal cardiovascular exam Rhythm:Regular Rate:Normal  TTE 06/2018 EF 60-65%, no valvular abnormalities   Neuro/Psych PSYCHIATRIC DISORDERS Anxiety negative neurological ROS     GI/Hepatic negative GI ROS, Neg liver ROS,   Endo/Other  diabetes, Type 2, Oral Hypoglycemic AgentsHypothyroidism   Renal/GU negative Renal ROS  negative genitourinary   Musculoskeletal  (+) Arthritis ,   Abdominal   Peds  Hematology negative hematology ROS (+)   Anesthesia Other Findings Right breast cancer  Reproductive/Obstetrics                            Anesthesia Physical Anesthesia Plan  ASA: III  Anesthesia Plan: General and Regional   Post-op Pain Management:  Regional for Post-op pain   Induction: Intravenous  PONV Risk Score and Plan: 3 and Midazolam, Dexamethasone and Ondansetron  Airway Management Planned: Oral ETT  Additional Equipment:   Intra-op Plan:   Post-operative Plan: Extubation in OR  Informed Consent: I have reviewed the patients History and Physical, chart, labs and discussed the procedure including the risks, benefits and alternatives for the proposed anesthesia with the patient or authorized representative who has indicated his/her understanding and acceptance.     Dental advisory given  Plan Discussed with: CRNA  Anesthesia Plan Comments:        Anesthesia Quick Evaluation

## 2018-07-13 NOTE — Op Note (Signed)
Preoperative diagnosis: right breast cancer with positive nodes on tad, need for chemotherapy Postoperative diagnosis: same as above Procedure: 1. Attempted port placement (see below) 2. Right completion ALND Surgeon: Dr Serita Grammes EBL: 25 cc Specimens: right axillary contents to pathology Drains 34 Fr Blake drain to axilla Complications none Sponge count correct at completion dispo to recovery stable.  Indications: This is a 74 yof who underwent right mastectomy and TAD for right breast cancer. She has multiple positive nodes and I recommended alnd as did our tumor board.  She was also recommended chemotherapy by oncology. I discussed a completion alnd and port placement with her.    Procedure: After informed consent was obtained patient was taken to the OR. She had undergone a pectoral block.  She had SCDs were in place. Antibiotics were given.  She was placed under general anesthesia.  She was prepped and draped in the standard sterile surgical fashion for the port.  A timeout was performed.  I first identified the right ij vein. This was very diminutive and deep.  I made a skin nick and then attempted to access the vein. Her blood pressure prior and during this was 80/40.  The vein was on the carotid. I was able to roll it off some with head movement.  It appeared I was in the vein numerous times but I think it just collapsed.  I was unable to get return flow except just for some very sluggish flow once.  On the final try I did access the carotid. I removed the needle and held pressure for over 5 minutes.  I relooked with [place] and there was no hematoma. I did also try to access the right subclavian a couple times and was not successful. I think with low blood pressure and volume depleted this was just too difficult.  I aborted port placement.  I will check a chest xray in pacu to make sure no ptx.  We then reprepped and draped her.  A timeout was performed again.  I then made an elliptical  incision overlying the fat pad in the axilla.  I used the extension of the mastectomy incision. I then entered the axilla. I identified the axillary vein which also was very diminutive.  I identified the thoracodorsal bundle and I did clip the vein overyling this as there were nodes attached. I then cleared the space to the muscle and the chest wall and passed this off the table. Hemostasis was observed. A 19 Fr Blake was placed and secured with a 2-0 nylon suture.  I then closed the deep tissue with 3-0 vicryl the skin was closed with 3-0 vicryl, 4-0 monocryl and glue. She tolerated well and was transferred to pacu.

## 2018-07-13 NOTE — Anesthesia Procedure Notes (Signed)
Anesthesia Regional Block: Pectoralis block   Pre-Anesthetic Checklist: ,, timeout performed, Correct Patient, Correct Site, Correct Laterality, Correct Procedure, Correct Position, site marked, Risks and benefits discussed,  Surgical consent,  Pre-op evaluation,  At surgeon's request and post-op pain management  Laterality: Right  Prep: Maximum Sterile Barrier Precautions used, chloraprep       Needles:  Injection technique: Single-shot  Needle Type: Echogenic Stimulator Needle     Needle Length: 9cm  Needle Gauge: 22     Additional Needles:   Procedures:,,,, ultrasound used (permanent image in chart),,,,  Narrative:  Start time: 07/13/2018 1:56 PM End time: 07/13/2018 2:06 PM Injection made incrementally with aspirations every 5 mL.  Performed by: Personally  Anesthesiologist: Freddrick March, MD  Additional Notes: Monitors applied. No increased pain on injection. No increased resistance to injection. Injection made in 5cc increments. Good needle visualization. Patient tolerated procedure well.

## 2018-07-13 NOTE — Progress Notes (Signed)
Received patient from PACU. Patient alert and oriented. Self ambulated from wheelchair to bed. Patients dressing clean, dry and intact. Jp drain to suction.  Patient oriented to room/bed controls and call bell. Will continue to monitor.

## 2018-07-13 NOTE — Transfer of Care (Signed)
Immediate Anesthesia Transfer of Care Note  Patient: Charlene Perry [facility]  Procedure(s) Performed: INSERTION PORT-A-CATH WITH ULTRASOUND (N/A ) RIGHT AXILLARY LYMPH NODE DISSECTION (Right )  Patient Location: PACU  Anesthesia Type:General  Level of Consciousness: oriented, drowsy and patient cooperative  Airway & Oxygen Therapy: Patient Spontanous Breathing and Patient connected to nasal cannula oxygen  Post-op Assessment: Report given to RN and Post -op Vital signs reviewed and stable  Post vital signs: Reviewed  Last Vitals:  Vitals Value Taken Time  BP 147/94 07/13/2018  5:06 PM  Temp    Pulse 94 07/13/2018  5:10 PM  Resp 15 07/13/2018  5:10 PM  SpO2 98 % 07/13/2018  5:10 PM  Vitals shown include unvalidated device data.  Last Pain:  Vitals:   07/13/18 1236  TempSrc: Oral         Complications: No apparent anesthesia complications

## 2018-07-13 NOTE — Interval H&P Note (Signed)
History and Physical Interval Note:  07/13/2018 2:13 PM Nodes positive on tad and needs chemotherapy. Has seen oncology. Will now proceed with right alnd and a port insertion. Discussed plan with patient.  Charlene Perry [facility]  has presented today for surgery, with the diagnosis of RIGHT BREAST CANCER  The various methods of treatment have been discussed with the patient and family. After consideration of risks, benefits and other options for treatment, the patient has consented to  Procedure(s): INSERTION PORT-A-CATH WITH ULTRASOUND (N/A) RIGHT AXILLARY LYMPH NODE DISSECTION (Right) as a surgical intervention .  The patient's history has been reviewed, patient examined, no change in status, stable for surgery.  I have reviewed the patient's chart and labs.  Questions were answered to the patient's satisfaction.     [name]

## 2018-07-14 ENCOUNTER — Encounter (HOSPITAL_COMMUNITY): Payer: Self-pay | Admitting: General Surgery

## 2018-07-14 DIAGNOSIS — C50411 Malignant neoplasm of upper-outer quadrant of right female breast: Secondary | ICD-10-CM | POA: Diagnosis not present

## 2018-07-14 LAB — BASIC METABOLIC PANEL
ANION GAP: 11 (ref 5–15)
BUN: 14 mg/dL (ref 8–23)
CO2: 25 mmol/L (ref 22–32)
Calcium: 8.7 mg/dL — ABNORMAL LOW (ref 8.9–10.3)
Chloride: 102 mmol/L (ref 98–111)
Creatinine, Ser: 1.01 mg/dL — ABNORMAL HIGH (ref 0.44–1.00)
GFR calc Af Amer: >60 mL/min (ref >60)
GFR calc non Af Amer: 58 mL/min — ABNORMAL LOW (ref >60)
Glucose, Bld: 257 mg/dL — ABNORMAL HIGH (ref 70–99)
Potassium: 3.8 mmol/L (ref 3.5–5.1)
Sodium: 138 mmol/L (ref 135–145)

## 2018-07-14 LAB — CBC
HCT: 32.6 % — ABNORMAL LOW (ref 36.0–46.0)
Hemoglobin: 10.3 g/dL — ABNORMAL LOW (ref 12.0–15.0)
MCH: 26.8 pg (ref 26.0–34.0)
MCHC: 31.6 g/dL (ref 30.0–36.0)
MCV: 84.7 fL (ref 80.0–100.0)
PLATELETS: 242 10*3/uL (ref 150–400)
RBC: 3.85 MIL/uL — ABNORMAL LOW (ref 3.87–5.11)
RDW: 13.3 % (ref 11.5–15.5)
WBC: 8.8 10*3/uL (ref 4.0–10.5)
nRBC: 0 % (ref 0.0–0.2)

## 2018-07-14 LAB — GLUCOSE, CAPILLARY
GLUCOSE-CAPILLARY: 150 mg/dL — AB (ref 70–99)
Glucose-Capillary: 133 mg/dL — ABNORMAL HIGH (ref 70–99)

## 2018-07-14 MED ORDER — METFORMIN HCL 500 MG PO TABS
1000.0000 mg | ORAL_TABLET | Freq: Two times a day (BID) | ORAL | Status: DC
  Administered 2018-07-14: 1000 mg via ORAL
  Filled 2018-07-14: qty 2

## 2018-07-14 MED ORDER — OXYCODONE HCL 5 MG PO TABS: 5.0000 mg | ORAL_TABLET | ORAL | 0 refills | Status: DC | PRN

## 2018-07-14 MED ORDER — METHOCARBAMOL 500 MG PO TABS: 500.0000 mg | ORAL_TABLET | Freq: Three times a day (TID) | ORAL | 0 refills | Status: DC

## 2018-07-14 NOTE — Discharge Instructions (Signed)
[facility] [phone]   POST OP INSTRUCTIONS Take 400 mg of ibuprofen every 8 hours or 650 mg tylenol every 6 hours for next 72 hours then as needed. Use ice several times daily also. Always review your discharge instruction sheet given to you by the facility where your surgery was performed. IF YOU HAVE DISABILITY OR FAMILY LEAVE FORMS, YOU MUST BRING THEM TO THE OFFICE FOR PROCESSING.   DO NOT GIVE THEM TO YOUR DOCTOR. A prescription for pain medication may be given to you upon discharge.  Take your pain medication as prescribed, if needed.  If narcotic pain medicine is not needed, then you may take acetaminophen (Tylenol), naprosyn (Alleve) or ibuprofen (Advil) as needed. 1. Take your usually prescribed medications unless otherwise directed. 2. If you need a refill on your pain medication, please contact your pharmacy.  They will contact our office to request authorization.  Prescriptions will not be filled after 5pm or on week-ends. 3. You should follow a light diet the first few days after arrival home, such as soup and crackers, etc.  Resume your normal diet the day after surgery. 4. Most patients will experience some swelling and bruising on the chest and underarm.  Ice packs will help.  Swelling and bruising can take several days to resolve. Wear the binder day and night until you return to the office.  5. It is common to experience some constipation if taking pain medication after surgery.  Increasing fluid intake and taking a stool softener (such as Colace) will usually help or prevent this problem from occurring.  A mild laxative (Milk of Magnesia or Miralax) should be taken according to package instructions if there are no bowel movements after 48 hours. 6. Unless discharge instructions indicate otherwise, leave your bandage dry and in place until your next appointment in 3-5 days.  You may take a limited sponge bath.  No tube baths or showers until the drains are  removed.  You may have steri-strips (small skin tapes) in place directly over the incision.  These strips should be left on the skin for 7-10 days. If you have glue it will come off in next couple week.  Any sutures will be removed at an office visit 7. DRAINS:  If you have drains in place, it is important to keep a list of the amount of drainage produced each day in your drains.  Before leaving the hospital, you should be instructed on drain care.  Call our office if you have any questions about your drains. I will remove your drains when they put out less than 30 cc or ml for 2 consecutive days. 8. ACTIVITIES:  You may resume regular (light) daily activities beginning the next day--such as daily self-care, walking, climbing stairs--gradually increasing activities as tolerated.  You may have sexual intercourse when it is comfortable.  Refrain from any heavy lifting or straining until approved by your doctor. a. You may drive when you are no longer taking prescription pain medication, you can comfortably wear a seatbelt, and you can safely maneuver your car and apply brakes. b. RETURN TO WORK:  __________________________________________________________ 9. You should see your doctor in the office for a follow-up appointment approximately 3-5 days after your surgery.  Your doctor's nurse will typically make your follow-up appointment when she calls you with your pathology report.  Expect your pathology report 3-4business days after surgery. 10. OTHER INSTRUCTIONS: ______________________________________________________________________________________________ ____________________________________________________________________________________________ WHEN TO CALL YOUR DR Harlow Carrizales: 1. Fever over 101.0 2.   Nausea and/or vomiting 3. Extreme swelling or bruising 4. Continued bleeding from incision. 5. Increased pain, redness, or drainage from the incision. The clinic staff is available to answer your questions  during regular business hours.  Please don't hesitate to call and ask to speak to one of the nurses for clinical concerns.  If you have a medical emergency, go to the nearest emergency room or call 911.  A surgeon from [facility] is always on call at the hospital. [address], [address], [place], [place]  [address] ? [address] [address], [place], [place]   [address] [phone] ? [phone] ? FAX [phone] Web site: [email or web address]  

## 2018-07-14 NOTE — Progress Notes (Signed)
1 Day Post-Op   Subjective/Chief Complaint: Tearful, no real pain   Objective: Vital signs in last 24 hours: Temp:  [97.2 F (36.2 C)-99 F (37.2 C)] 97.9 F (36.6 C) (01/29 0541) Pulse Rate:  [81-93] 85 (01/29 0541) Resp:  [9-20] 16 (01/29 0541) BP: (120-163)/(59-96) 132/67 (01/29 0541) SpO2:  [92 %-100 %] 94 % (01/29 0541) Weight:  [101.3 kg] 101.3 kg (01/28 1236)    Intake/Output from previous day: 01/28 0701 - 01/29 0700 In: 1529.8 [I.V.:1529.8] Out: 91 [Drains:66; Blood:25] Intake/Output this shift: No intake/output data recorded.  drain serosang as expected, incision clean with some ecchymosis  Lab Results:  Recent Labs    07/14/18 0207  WBC 8.8  HGB 10.3*  HCT 32.6*  PLT 242   BMET Recent Labs    07/14/18 0207  NA 138  K 3.8  CL 102  CO2 25  GLUCOSE 257*  BUN 14  CREATININE 1.01*  CALCIUM 8.7*   PT/INR No results for input(s): LABPROT, INR in the last 72 hours. ABG No results for input(s): PHART, HCO3 in the last 72 hours.  Invalid input(s): PCO2, PO2  Studies/Results: Dg Chest Port 1 View  Result Date: 07/13/2018 CLINICAL DATA:  Post failed RIGHT side Port-A-Cath placement EXAM: PORTABLE CHEST 1 VIEW COMPARISON:  Portable exam 1703 hours compared to CT chest of 07/02/2018 FINDINGS: Upper normal heart size. Mediastinal contours and pulmonary vascularity normal. Atherosclerotic calcification aorta. Bibasilar atelectasis without pulmonary infiltrate, pleural effusion or pneumothorax. Osseous structures unremarkable. Surgical drain and surgical clips noted at the RIGHT axilla. Post RIGHT mastectomy. IMPRESSION: Bibasilar atelectasis. No pneumothorax. Electronically Signed   By: Lavonia Dana M.D.   On: 07/13/2018 17:30    Anti-infectives: Anti-infectives (From admission, onward)   Start     Dose/Rate Route Frequency Ordered Stop   07/13/18 1415  ceFAZolin (ANCEF) IVPB 2g/100 mL premix     2 g 200 mL/hr over 30 Minutes Intravenous On call to O.R.  07/13/18 1227 07/13/18 1520   07/13/18 1242  ceFAZolin (ANCEF) 2-4 GM/100ML-% IVPB    Note to Pharmacy:  Starleen Arms   : cabinet override      07/13/18 1242 07/13/18 1520      Assessment/Plan: POD 1 attempt at port, right alnd -discussed inability to pass wire and place port due to concern for injury. Her bp was very low during case and vein diminutive. Discussed plan for either me or ir to place later. She understands -hopefully home later today -path pending  [name] 07/14/2018

## 2018-07-14 NOTE — Anesthesia Postprocedure Evaluation (Signed)
Anesthesia Post Note  Patient: Charlene Perry [facility]  Procedure(s) Performed: INSERTION PORT-A-CATH WITH ULTRASOUND (N/A ) RIGHT AXILLARY LYMPH NODE DISSECTION (Right )     Patient location during evaluation: PACU Anesthesia Type: Regional and General Level of consciousness: awake and alert Pain management: pain level controlled Vital Signs Assessment: post-procedure vital signs reviewed and stable Respiratory status: spontaneous breathing, nonlabored ventilation, respiratory function stable and patient connected to nasal cannula oxygen Cardiovascular status: blood pressure returned to baseline and stable Postop Assessment: no apparent nausea or vomiting Anesthetic complications: no    Last Vitals:  Vitals:   07/14/18 0148 07/14/18 0541  BP: (!) 120/59 132/67  Pulse: 84 85  Resp: 18 16  Temp: 36.7 C 36.6 C  SpO2: 96% 94%    Last Pain:  Vitals:   07/14/18 0901  TempSrc:   PainSc: 4                  Haileigh Pitz L Sonya Gunnoe

## 2018-07-15 NOTE — Discharge Summary (Signed)
Physician Discharge Summary  Patient ID: Charlene Perry MRN: [id number] DOB/AGE: July 31, 1950 68 y.o.  Admit date: 07/13/2018 Discharge date: 07/15/2018  Admission Diagnoses: Breast cancer  Discharge Diagnoses:  Active Problems:   Breast cancer, right [facility])   Discharged Condition: good  Hospital Course: 18 yof underwent completion right axillary node dissection and attempt at port placement. She is doiing well the following am and will be discharged  Consults: None  Significant Diagnostic Studies: none  Treatments: surgery: right alnd    Disposition: home   Allergies as of 07/14/2018   No Known Allergies     Medication List    TAKE these medications   aspirin EC 81 MG tablet Take 81 mg by mouth daily.   celecoxib 200 MG capsule Commonly known as:  CELEBREX Take 200 mg by mouth daily.   cetirizine 10 MG tablet Commonly known as:  ZYRTEC Take 10 mg by mouth daily.   diazepam 5 MG tablet Commonly known as:  VALIUM Take 1 tablet (5mg ) po 1hr prior to scheduled MRI, and 15-27min prior to MRI.   furosemide 40 MG tablet Commonly known as:  LASIX Take 80 mg by mouth daily.   gabapentin 400 MG capsule Commonly known as:  NEURONTIN Take 400 mg by mouth 2 (two) times daily.   letrozole 2.5 MG tablet Commonly known as:  FEMARA Take 2.5 mg by mouth daily.   levothyroxine 125 MCG tablet Commonly known as:  SYNTHROID, LEVOTHROID Take 125 mcg by mouth daily before breakfast.   lidocaine-prilocaine cream Commonly known as:  EMLA Apply to affected area once   LORazepam 0.5 MG tablet Commonly known as:  ATIVAN Take 1 tablet (0.5 mg total) by mouth at bedtime as needed (Nausea or vomiting).   metFORMIN 1000 MG tablet Commonly known as:  GLUCOPHAGE Take 1,000 mg by mouth 2 (two) times daily with a meal.   methocarbamol 500 MG tablet Commonly known as:  ROBAXIN Take 1 tablet (500 mg total) by mouth every 6 (six) hours as needed for muscle spasms. What  changed:  Another medication with the same name was added. Make sure you understand how and when to take each.   methocarbamol 500 MG tablet Commonly known as:  ROBAXIN Take 1 tablet (500 mg total) by mouth 3 (three) times daily. What changed:  You were already taking a medication with the same name, and this prescription was added. Make sure you understand how and when to take each.   ondansetron 8 MG tablet Commonly known as:  ZOFRAN Take 1 tablet (8 mg total) by mouth 2 (two) times daily as needed. Start on the third day after chemotherapy.   oxyCODONE 5 MG immediate release tablet Commonly known as:  Oxy IR/ROXICODONE Take 1 tablet (5 mg total) by mouth every 4 (four) hours as needed for moderate pain. What changed:  Another medication with the same name was added. Make sure you understand how and when to take each.   oxyCODONE 5 MG immediate release tablet Commonly known as:  Oxy IR/ROXICODONE Take 1-2 tablets (5-10 mg total) by mouth every 4 (four) hours as needed for moderate pain. What changed:  You were already taking a medication with the same name, and this prescription was added. Make sure you understand how and when to take each.   potassium chloride 10 MEQ tablet Commonly known as:  K-DUR,KLOR-CON Take 20 mEq by mouth daily.   prochlorperazine 10 MG tablet Commonly known as:  COMPAZINE Take 1 tablet (10 mg  total) by mouth every 6 (six) hours as needed (Nausea or vomiting).   rosuvastatin 40 MG tablet Commonly known as:  CRESTOR Take 40 mg by mouth at bedtime.   traMADol 50 MG tablet Commonly known as:  ULTRAM Take 50 mg by mouth every 6 (six) hours as needed for pain.      Follow-up Information    [name], MD Follow up in 1 week(s).   Specialty:  General Surgery Contact information: [address] [place] [address] [phone]           Signed: [name] 07/15/2018, 9:51 AM

## 2018-07-19 ENCOUNTER — Encounter (HOSPITAL_COMMUNITY): Payer: Self-pay | Admitting: General Surgery

## 2018-07-20 ENCOUNTER — Telehealth: Payer: Self-pay | Admitting: Hematology and Oncology

## 2018-07-20 NOTE — Telephone Encounter (Signed)
Scheduled appt per 1/24 sch message - pt is aware of appt date and time   

## 2018-07-21 ENCOUNTER — Telehealth: Payer: Self-pay | Admitting: *Deleted

## 2018-07-21 NOTE — Telephone Encounter (Signed)
Receive notification from Dr. Donne Hazel that chemo will need to be delayed d/t healing and port placement. Pt notified that appts for 2/18 will be cancelled and chemo will begin on 2/25.

## 2018-07-22 ENCOUNTER — Other Ambulatory Visit (HOSPITAL_COMMUNITY): Payer: Self-pay | Admitting: General Surgery

## 2018-07-22 DIAGNOSIS — Z170 Estrogen receptor positive status [ER+]: Principal | ICD-10-CM

## 2018-07-22 DIAGNOSIS — C50411 Malignant neoplasm of upper-outer quadrant of right female breast: Secondary | ICD-10-CM

## 2018-07-28 ENCOUNTER — Encounter: Payer: Self-pay | Admitting: Rehabilitation

## 2018-07-28 ENCOUNTER — Ambulatory Visit: Payer: BLUE CROSS/BLUE SHIELD | Attending: General Surgery | Admitting: Rehabilitation

## 2018-07-28 ENCOUNTER — Other Ambulatory Visit: Payer: Self-pay

## 2018-07-28 ENCOUNTER — Other Ambulatory Visit: Payer: Self-pay | Admitting: Radiology

## 2018-07-28 DIAGNOSIS — M25511 Pain in right shoulder: Secondary | ICD-10-CM | POA: Diagnosis present

## 2018-07-28 DIAGNOSIS — Z170 Estrogen receptor positive status [ER+]: Secondary | ICD-10-CM | POA: Diagnosis present

## 2018-07-28 DIAGNOSIS — C50411 Malignant neoplasm of upper-outer quadrant of right female breast: Secondary | ICD-10-CM | POA: Insufficient documentation

## 2018-07-28 DIAGNOSIS — Z9189 Other specified personal risk factors, not elsewhere classified: Secondary | ICD-10-CM | POA: Diagnosis present

## 2018-07-28 DIAGNOSIS — M25611 Stiffness of right shoulder, not elsewhere classified: Secondary | ICD-10-CM | POA: Diagnosis present

## 2018-07-28 NOTE — Patient Instructions (Signed)
Post op stretches continue for now

## 2018-07-28 NOTE — Therapy (Signed)
[place], [place], [address] Phone: [phone]   Fax:  [phone]  Physical Therapy Evaluation  Patient Details  Name: Charlene Perry MRN: [id number] Date of Birth: 07/18/1950 Referring Provider (PT): Dr. Donne Hazel   Encounter Date: 07/28/2018  PT End of Session - 07/28/18 1537    Visit Number  1    Number of Visits  13    Date for PT Re-Evaluation  09/08/18    Authorization Type  no limit    PT Start Time  1345    PT Stop Time  1430    PT Time Calculation (min)  45 min    Activity Tolerance  Patient tolerated treatment well    Behavior During Therapy  [facility] for tasks assessed/performed       Past Medical History:  Diagnosis Date  . Arthritis   . Breast cancer in female [facility])    Right  . Cancer ([place])   . Diabetes mellitus without complication ([place])   . Family history of breast cancer   . Family history of colon cancer   . Hypertension    takes fluid pills  . Hypothyroidism   . Pink eye disease of left eye    went to eye md and received drops - yesterday.  . Sleep apnea    back in the 1990's  . Vitiligo     Past Surgical History:  Procedure Laterality Date  . AXILLARY LYMPH NODE DISSECTION Right 07/13/2018   Procedure: RIGHT AXILLARY LYMPH NODE DISSECTION;  Surgeon: [name], MD;  Location: [place];  Service: General;  Laterality: Right;  . BREAST SURGERY     left breast fibro adenoma  . CATARACT EXTRACTION Right 2019  . CERVICAL FUSION N/A    C5 &6  . CESAREAN SECTION    . CHOLECYSTECTOMY  2004-2005  . CYSTECTOMY     patient denies  . EYE SURGERY    . MASTECTOMY WITH RADIOACTIVE SEED GUIDED EXCISION AND AXILLARY SENTINEL LYMPH NODE BIOPSY Right 06/21/2018  . MASTECTOMY WITH RADIOACTIVE SEED GUIDED EXCISION AND AXILLARY SENTINEL LYMPH NODE BIOPSY Right 06/21/2018   Procedure: RIGHT TOTAL MASTECTOMY WITH RIGHT AXILLARY SENTINEL NODE BIOPSY AND RIGHT SEED GUIDED NODE  EXCISION;  Surgeon: [name], MD;  Location: [place];  Service: General;  Laterality: Right;  . MYOMECTOMY  1986  . PARTIAL HYSTERECTOMY  1995  . PILONIDAL CYST EXCISION    . PORTACATH PLACEMENT N/A 07/13/2018   Procedure: INSERTION PORT-A-CATH WITH ULTRASOUND;  Surgeon: [name], MD;  Location: [place];  Service: General;  Laterality: N/A;  . TONSILLECTOMY  2002  . UVULOPALATOPHARYNGOPLASTY, TONSILLECTOMY AND SEPTOPLASTY      There were no vitals filed for this visit.   Subjective Assessment - 07/28/18 1348    Subjective  I was supposed to go back to work today but didn't due to discomfort.  Still lots of numbness and discomfort. 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 History of DM, cervical fusion, and partial hysterectomy.  Pt lives in [place], [place].  Chemotherapy delayed due to healing and will now begin 08/10/18. Radiation after chemotherapy. Investigating kidney lesion on the Rt with decreased GFR    Limitations  Lifting;House hold activities    Patient Stated Goals  get back to normal  Currently in Pain?  Yes    Pain Score  1     Pain Location  Axilla    Pain Orientation  Right    Pain Descriptors / Indicators  Discomfort    Pain Type  Surgical pain    Pain Onset  1 to 4 weeks ago    Pain Frequency  Constant         [facility] PT Assessment - 07/28/18 0001      Assessment   Medical Diagnosis  Rt mastectomy    Referring Provider (PT)  Dr. Donne Hazel    Onset Date/Surgical Date  07/13/18    Hand Dominance  Left    Prior Therapy  no      Precautions   Precaution Comments  cancer, lymphedema      Restrictions   Weight Bearing Restrictions  No      Balance Screen   Has the patient fallen in the past 6 months  No    Has the patient had a decrease in activity level because of a  fear of falling?   No    Is the patient reluctant to leave their home because of a fear of falling?   No      Home Film/video editor residence    Living Arrangements  Spouse/significant other      Prior Function   Level of Independence  Independent      Cognition   Overall Cognitive Status  Within Functional Limits for tasks assessed      Observation/Other Assessments   Observations  healing incision from mastectomy and ALNB some increased redness in the axilla which pt says Dr. Donne Hazel will be looking at next week      Sensation   Additional Comments  reports still pretty numb Rt upper arm and axilla      Coordination   Gross Motor Movements are Fluid and Coordinated  Yes      Posture/Postural Control   Posture/Postural Control  Postural limitations    Postural Limitations  Rounded Shoulders;Forward head;Increased thoracic kyphosis      ROM / Strength   AROM / PROM / Strength  AROM      AROM   AROM Assessment Site  Shoulder    Right/Left Shoulder  Right;Left    Right Shoulder Flexion  117 Degrees    Right Shoulder ABduction  122 Degrees    Right Shoulder Internal Rotation  70 Degrees    Right Shoulder External Rotation  80 Degrees    Right Shoulder Horizontal ABduction  40 Degrees    Left Shoulder Flexion  145 Degrees    Left Shoulder ABduction  145 Degrees    Left Shoulder Internal Rotation  70 Degrees    Left Shoulder External Rotation  85 Degrees    Left Shoulder Horizontal ABduction  40 Degrees        LYMPHEDEMA/ONCOLOGY QUESTIONNAIRE - 07/28/18 1417      Type   Cancer Type  Rt breast      Surgeries   Mastectomy Date  06/21/18    Sentinel Lymph Node Biopsy Date  06/21/18    Axillary Lymph Node Dissection Date  07/13/18    Number Lymph Nodes Removed  17   7 positive     Treatment   Active Chemotherapy Treatment  No    Past Chemotherapy Treatment  No    Active Radiation Treatment  No    Past Radiation Treatment  No  Current  Hormone Treatment  No    Past Hormone Therapy  No      What other symptoms do you have   Are you Having Heaviness or Tightness  No    Are you having Pain  No      Lymphedema Assessments   Lymphedema Assessments  Upper extremities      Right Upper Extremity Lymphedema   10 cm Proximal to Olecranon Process  35.7 cm    Olecranon Process  28.4 cm    10 cm Proximal to Ulnar Styloid Process  22.6 cm    Just Proximal to Ulnar Styloid Process  17.2 cm    Across Hand at [employer]  20.3 cm    At [place] of 2nd Digit  7.4 cm      Left Upper Extremity Lymphedema   10 cm Proximal to Olecranon Process  34.7 cm    Olecranon Process  28.2 cm    10 cm Proximal to Ulnar Styloid Process  22.7 cm    Just Proximal to Ulnar Styloid Process  16.2 cm    Across Hand at [employer]  19.7 cm    At [place] of 2nd Digit  7.2 cm          Quick Dash - 07/28/18 0001    Open a tight or new jar  Unable    Do heavy household chores (wash walls, wash floors)  Unable    Carry a shopping bag or briefcase  Moderate difficulty    Wash your back  Unable    Use a knife to cut food  No difficulty    Recreational activities in which you take some force or impact through your arm, shoulder, or hand (golf, hammering, tennis)  Unable    During the past week, to what extent has your arm, shoulder or hand problem interfered with your normal social activities with family, friends, neighbors, or groups?  Modererately    During the past week, to what extent has your arm, shoulder or hand problem limited your work or other regular daily activities  Modererately    Arm, shoulder, or hand pain.  Moderate    Tingling (pins and needles) in your arm, shoulder, or hand  Severe    Difficulty Sleeping  Moderate difficulty    DASH Score  65.91 %        Objective measurements completed on examination: See above findings.      [facility] Adult PT Treatment/Exercise - 07/28/18 0001      Exercises   Exercises  Other Exercises     Other Exercises   pt already has post op stretches from Dr. Donne Hazel.  Modified to supine cane flexion and supine behind the head stretch              PT Education - 07/28/18 1537    Education Details  POC, post op stretches supine modification, lymphedema briefly    Person(s) Educated  Patient    Methods  Explanation;Demonstration;Tactile cues;Verbal cues    Comprehension  Returned demonstration;Verbalized understanding          PT Long Term Goals - 07/28/18 1545      PT LONG TERM GOAL #1   Title  Pt will be able to tolerate radiation position on the Rt side    Time  6    Period  Weeks    Status  New    Target Date  09/08/18      PT LONG  TERM GOAL #2   Title  Pt will improve Rt shoulder AROM flexion and abduction to at least 145 to match the opposite side     Time  6    Period  Weeks    Status  New    Target Date  09/08/18      PT LONG TERM GOAL #3   Title  Pt will decrease DQASH to 35% or less disability     Time  6    Period  Weeks    Status  New    Target Date  09/08/18      PT LONG TERM GOAL #4   Title  Pt will be educated on how to stretch and exercise throughout chemotherapy and radiation if unable to tolerate treatment during chemotherapy     Time  6    Period  Weeks    Status  New    Target Date  09/08/18             Plan - 07/28/18 1538    Clinical Impression Statement  Pt presents today 2 weeks after return for ALND due to 6/7 positive nodes on SLNB.  1/10 positive on ALND per patient.  Pt is to begin chemotherapy 08/10/18 followed by radiation.  Pt currently presents with decreased shoulder ROM and discomfort post operatively.  Drains were removed last week.      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 History of DM, cervical fusion, and partial hysterectomy. Pt lives in [place], [place].  Chemotherapy delayed due to healing and will now begin 08/10/18. Radiation after chemotherapy. Investigating kidney lesion on the Rt with decreased GFR    Clinical Presentation  Evolving    Clinical Presentation due to:  new post op status    Rehab Potential  Excellent    Clinical Impairments Affecting Rehab Potential  starting chemo 08/10/18    PT Frequency  1x / week    PT Duration  8 weeks    PT Treatment/Interventions  ADLs/Self Care Home Management;Therapeutic exercise;Therapeutic activities;Patient/family education;Manual techniques;Manual lymph drainage;Passive range of motion;Scar mobilization    PT Next Visit Plan  Lt shoulder PROM/AAROM dowel rod, give lymphedema paper. ( Brief education was given first day, )    PT Home Exercise Plan  given post op exercises 07/28/18    Consulted and Agree with Plan of Care  Patient       Patient will benefit from skilled therapeutic intervention in order to improve the following deficits and impairments:  Decreased activity tolerance, Decreased strength, Impaired UE functional use, Pain, Decreased knowledge of precautions, Decreased range of motion  Visit Diagnosis: Malignant neoplasm of upper-outer quadrant of right breast in female, estrogen receptor positive (H[facility])  Stiffness of right shoulder, not elsewhere classified  Acute pain of right shoulder  At risk for lymphedema     Problem List Patient Active Problem List   Diagnosis Date Noted  . Breast cancer, right ([place]) 06/21/2018  . Genetic testing 01/14/2018  . Family history of colon cancer   . Family history of breast cancer   . Malignant neoplasm of upper-outer quadrant of right breast in female, estrogen receptor positive ([place]) 12/15/2017    Shan Levans, PT 07/28/2018, 3:47 PM  [place], [place], [address] Phone: [phone]   Fax:  [phone]  Name: Charlene Perry MRN: [id number] Date  of Birth: Jun 10, 1951

## 2018-07-29 ENCOUNTER — Ambulatory Visit (HOSPITAL_COMMUNITY)
Admission: RE | Admit: 2018-07-29 | Discharge: 2018-07-29 | Disposition: A | Discharge status: Home / Self Care | Payer: BLUE CROSS/BLUE SHIELD | Source: Ambulatory Visit | Attending: General Surgery | Admitting: General Surgery

## 2018-07-29 ENCOUNTER — Encounter (HOSPITAL_COMMUNITY): Payer: Self-pay | Admitting: Diagnostic Radiology

## 2018-07-29 ENCOUNTER — Other Ambulatory Visit: Payer: Self-pay

## 2018-07-29 DIAGNOSIS — C50411 Malignant neoplasm of upper-outer quadrant of right female breast: Secondary | ICD-10-CM

## 2018-07-29 DIAGNOSIS — L80 Vitiligo: Secondary | ICD-10-CM | POA: Diagnosis not present

## 2018-07-29 DIAGNOSIS — E039 Hypothyroidism, unspecified: Secondary | ICD-10-CM | POA: Insufficient documentation

## 2018-07-29 DIAGNOSIS — Z79899 Other long term (current) drug therapy: Secondary | ICD-10-CM | POA: Insufficient documentation

## 2018-07-29 DIAGNOSIS — Z170 Estrogen receptor positive status [ER+]: Secondary | ICD-10-CM | POA: Insufficient documentation

## 2018-07-29 DIAGNOSIS — I10 Essential (primary) hypertension: Secondary | ICD-10-CM | POA: Insufficient documentation

## 2018-07-29 DIAGNOSIS — E119 Type 2 diabetes mellitus without complications: Secondary | ICD-10-CM | POA: Insufficient documentation

## 2018-07-29 DIAGNOSIS — Z7982 Long term (current) use of aspirin: Secondary | ICD-10-CM | POA: Insufficient documentation

## 2018-07-29 DIAGNOSIS — Z9011 Acquired absence of right breast and nipple: Secondary | ICD-10-CM | POA: Diagnosis not present

## 2018-07-29 DIAGNOSIS — Z7984 Long term (current) use of oral hypoglycemic drugs: Secondary | ICD-10-CM | POA: Insufficient documentation

## 2018-07-29 HISTORY — PX: IR IMAGING GUIDED PORT INSERTION: IMG5740

## 2018-07-29 LAB — CBC
HCT: 36.8 % (ref 36.0–46.0)
Hemoglobin: 11.2 g/dL — ABNORMAL LOW (ref 12.0–15.0)
MCH: 25.9 pg — ABNORMAL LOW (ref 26.0–34.0)
MCHC: 30.4 g/dL (ref 30.0–36.0)
MCV: 85.2 fL (ref 80.0–100.0)
Platelets: 235 10*3/uL (ref 150–400)
RBC: 4.32 MIL/uL (ref 3.87–5.11)
RDW: 13.6 % (ref 11.5–15.5)
WBC: 6.1 10*3/uL (ref 4.0–10.5)
nRBC: 0 % (ref 0.0–0.2)

## 2018-07-29 LAB — GLUCOSE, CAPILLARY
Glucose-Capillary: 114 mg/dL — ABNORMAL HIGH (ref 70–99)
Glucose-Capillary: 119 mg/dL — ABNORMAL HIGH (ref 70–99)

## 2018-07-29 LAB — BASIC METABOLIC PANEL
ANION GAP: 13 (ref 5–15)
BUN: 18 mg/dL (ref 8–23)
CO2: 25 mmol/L (ref 22–32)
Calcium: 9.2 mg/dL (ref 8.9–10.3)
Chloride: 103 mmol/L (ref 98–111)
Creatinine, Ser: 0.95 mg/dL (ref 0.44–1.00)
GFR calc Af Amer: >60 mL/min (ref >60)
GLUCOSE: 117 mg/dL — AB (ref 70–99)
Potassium: 3.5 mmol/L (ref 3.5–5.1)
Sodium: 141 mmol/L (ref 135–145)

## 2018-07-29 LAB — PROTIME-INR
INR: 1.03
Prothrombin Time: 13.4 seconds (ref 11.4–15.2)

## 2018-07-29 MED ORDER — LIDOCAINE HCL 1 % IJ SOLN
INTRAMUSCULAR | Status: AC
  Filled 2018-07-29: qty 20

## 2018-07-29 MED ORDER — CEFAZOLIN SODIUM-DEXTROSE 2-4 GM/100ML-% IV SOLN
INTRAVENOUS | Status: AC
  Administered 2018-07-29: 2 g via INTRAVENOUS
  Filled 2018-07-29: qty 100

## 2018-07-29 MED ORDER — FENTANYL CITRATE (PF) 100 MCG/2ML IJ SOLN
INTRAMUSCULAR | Status: AC | PRN
  Administered 2018-07-29: 50 ug via INTRAVENOUS
  Administered 2018-07-29: 25 ug via INTRAVENOUS

## 2018-07-29 MED ORDER — SODIUM CHLORIDE 0.9 % IV SOLN: INTRAVENOUS | Status: DC

## 2018-07-29 MED ORDER — MIDAZOLAM HCL 2 MG/2ML IJ SOLN
INTRAMUSCULAR | Status: AC
  Filled 2018-07-29: qty 4

## 2018-07-29 MED ORDER — CEFAZOLIN SODIUM-DEXTROSE 2-4 GM/100ML-% IV SOLN
2.0000 g | INTRAVENOUS | Status: AC
  Administered 2018-07-29: 2 g via INTRAVENOUS

## 2018-07-29 MED ORDER — SODIUM CHLORIDE 0.9 % IV SOLN
INTRAVENOUS | Status: AC | PRN
  Administered 2018-07-29: 10 mL/h via INTRAVENOUS

## 2018-07-29 MED ORDER — HEPARIN SOD (PORK) LOCK FLUSH 100 UNIT/ML IV SOLN
INTRAVENOUS | Status: AC
  Administered 2018-07-29: 500 [IU]
  Filled 2018-07-29: qty 5

## 2018-07-29 MED ORDER — LIDOCAINE HCL 1 % IJ SOLN
INTRAMUSCULAR | Status: AC | PRN
  Administered 2018-07-29: 15 mL

## 2018-07-29 MED ORDER — LIDOCAINE-EPINEPHRINE (PF) 1 %-1:200000 IJ SOLN
INTRAMUSCULAR | Status: AC
  Filled 2018-07-29: qty 30

## 2018-07-29 MED ORDER — FENTANYL CITRATE (PF) 100 MCG/2ML IJ SOLN
INTRAMUSCULAR | Status: AC
  Filled 2018-07-29: qty 4

## 2018-07-29 MED ORDER — MIDAZOLAM HCL 2 MG/2ML IJ SOLN
INTRAMUSCULAR | Status: AC | PRN
  Administered 2018-07-29: 0.5 mg via INTRAVENOUS
  Administered 2018-07-29: 1 mg via INTRAVENOUS

## 2018-07-29 MED ORDER — LIDOCAINE-EPINEPHRINE (PF) 1 %-1:200000 IJ SOLN
INTRAMUSCULAR | Status: AC | PRN
  Administered 2018-07-29: 20 mL

## 2018-07-29 NOTE — Sedation Documentation (Signed)
Patient denies pain and is resting comfortably.  

## 2018-07-29 NOTE — Discharge Instructions (Signed)
Moderate Conscious Sedation, Adult, Care After  These instructions provide you with information about caring for yourself after your procedure. Your health care provider may also give you more specific instructions. Your treatment has been planned according to current medical practices, but problems sometimes occur. Call your health care provider if you have any problems or questions after your procedure.  What can I expect after the procedure?  After your procedure, it is common:   To feel sleepy for several hours.   To feel clumsy and have poor balance for several hours.   To have poor judgment for several hours.   To vomit if you eat too soon.  Follow these instructions at home:  For at least 24 hours after the procedure:     Do not:  ? Participate in activities where you could fall or become injured.  ? Drive.  ? Use heavy machinery.  ? Drink alcohol.  ? Take sleeping pills or medicines that cause drowsiness.  ? Make important decisions or sign legal documents.  ? Take care of children on your own.   Rest.  Eating and drinking   Follow the diet recommended by your health care provider.   If you vomit:  ? Drink water, juice, or soup when you can drink without vomiting.  ? Make sure you have little or no nausea before eating solid foods.  General instructions   Have a responsible adult stay with you until you are awake and alert.   Take over-the-counter and prescription medicines only as told by your health care provider.   If you smoke, do not smoke without supervision.   Keep all follow-up visits as told by your health care provider. This is important.  Contact a health care provider if:   You keep feeling nauseous or you keep vomiting.   You feel light-headed.   You develop a rash.   You have a fever.  Get help right away if:   You have trouble breathing.  This information is not intended to replace advice given to you by your health care provider. Make sure you discuss any questions you have  with your health care provider.  Document Released: 03/23/2013 Document Revised: 11/05/2015 Document Reviewed: 09/22/2015  Elsevier Interactive Patient Education  2019 [employer]  Implanted Port Removal    Implanted port removal is a procedure to remove the port and catheter that are implanted under your skin. The port is a small disc under your skin that can be punctured with a needle. It is connected to a vein in your chest or neck by a small flexible tube (catheter). The implanted port is used to give medicines for treatments, and it may also be used to take blood samples.  Your health care provider will remove the implanted port if:   You no longer need it for treatment.   It is not working properly.   The area around it gets infected.  Tell a health care provider about:   Any allergies you have.   All medicines you are taking, including vitamins, herbs, eye drops, creams, and over-the-counter medicines.   Any problems you or family members have had with anesthetic medicines.   Any blood disorders you have.   Any surgeries you have had.   Any medical conditions you have.   Whether you are pregnant or may be pregnant.  What are the risks?  Generally, this is a safe procedure. However, problems may occur, including:   Infection.   Bleeding.     Allergic reactions to anesthetic medicines.   Damage to nerves or blood vessels.  What happens before the procedure?  Medicines   Ask your health care provider about:  ? Changing or stopping your regular medicines. This is especially important if you are taking diabetes medicines or blood thinners.  ? Taking medicines such as aspirin and ibuprofen. These medicines can thin your blood. Do not take these medicines unless your health care provider tells you to take them.  ? Taking over-the-counter medicines, vitamins, herbs, and supplements.  General instructions   You will have:  ? A physical exam.  ? Blood tests.  ? Imaging tests, including a chest  X-ray.   Follow instructions from your health care provider about eating or drinking restrictions.   Ask your health care provider how your surgical site will be marked or identified.   Ask your health care provider what steps will be taken to help prevent infection. These may include:  ? Removing hair at the surgery site.  ? Washing skin with a germ-killing soap.  ? Antibiotic medicine.   Plan to have someone take you home from the hospital or clinic.   If you will be going home right after the procedure, plan to have a responsible adult care for you for at least 24 hours after you leave the hospital or clinic. This is important.  What happens during the procedure?   You may be given one or more of the following:  ? A medicine to help you relax (sedative).  ? A medicine to numb the area (local anesthetic).   A small incision will be made at the site of your implanted port.   The implanted port and the catheter that has been inside your vein will be gently removed.   The port and catheter will be inspected to make sure that all the parts have been removed. Part of the catheter may be tested for bacteria.   The incision will be closed with stitches (sutures), adhesive strips, or skin glue.   A bandage (dressing) will be placed over the incision. The health care provider may apply gentle pressure over the dressing for about 5 minutes.  The procedure may vary among health care providers and hospitals.  What happens after the procedure?   Your blood pressure, heart rate, breathing rate, and blood oxygen level will be monitored until you leave the hospital or clinic.   You will be monitored to make sure that there is no bleeding from the site where the port was removed.   Do not drive for 24 hours if you were given a sedative during your procedure.  Summary   Implanted port removal is a procedure to remove the port and catheter that are implanted under your skin.   Before the procedure, follow your health  care provider's instructions about changing or stopping your regular medicines. This is especially important if you are taking diabetes medicines or blood thinners.   If you will be going home right after the procedure, plan to have a responsible adult care for you for at least 24 hours after you leave the hospital or clinic.  This information is not intended to replace advice given to you by your health care provider. Make sure you discuss any questions you have with your health care provider.  Document Released: 05/14/2015 Document Revised: 07/16/2017 Document Reviewed: 07/16/2017  Elsevier Interactive Patient Education  2019 [employer]

## 2018-07-29 NOTE — Sedation Documentation (Signed)
Patient is resting comfortably. 

## 2018-07-29 NOTE — H&P (Signed)
Chief Complaint: Patient was seen in consultation today for port placement.  Referring Physician(s): [name]  Supervising Physician: Markus Daft  Patient Status: [facility] - Out-pt  History of Present Illness: Charlene Perry is a 68 y.o. female with a past medical history significant for HTN, hypothyroidism, vitiligo, DM and right sided breast cancer s/p right mastectomy who presents today for port placement. She had previously been seen by Dr. Donne Hazel on 07/13/18 for right axillary lymph node dissection and attempted port placement. Unfortunately the port was unable to be placed at that time after several attempts and was subsequently aborted. Request has been made to IR for port placement today.  Patient denies any complaints, she states she feels good overall - has lost a little bit of her appetite due to anxiety. She states plan is for chemotherapy first and then radiation to the right breast after chemotherapy is completed.   Past Medical History:  Diagnosis Date  . Arthritis   . Breast cancer in female [facility])    Right  . Cancer ([place])   . Diabetes mellitus without complication ([place])   . Family history of breast cancer   . Family history of colon cancer   . Hypertension    takes fluid pills  . Hypothyroidism   . Pink eye disease of left eye    went to eye md and received drops - yesterday.  . Sleep apnea    back in the 1990's  . Vitiligo     Past Surgical History:  Procedure Laterality Date  . AXILLARY LYMPH NODE DISSECTION Right 07/13/2018   Procedure: RIGHT AXILLARY LYMPH NODE DISSECTION;  Surgeon: [name], MD;  Location: [place];  Service: General;  Laterality: Right;  . BREAST SURGERY     left breast fibro adenoma  . CATARACT EXTRACTION Right 2019  . CERVICAL FUSION N/A    C5 &6  . CESAREAN SECTION    . CHOLECYSTECTOMY  2004-2005  . CYSTECTOMY     patient denies  . EYE SURGERY    . MASTECTOMY WITH RADIOACTIVE SEED GUIDED EXCISION AND  AXILLARY SENTINEL LYMPH NODE BIOPSY Right 06/21/2018  . MASTECTOMY WITH RADIOACTIVE SEED GUIDED EXCISION AND AXILLARY SENTINEL LYMPH NODE BIOPSY Right 06/21/2018   Procedure: RIGHT TOTAL MASTECTOMY WITH RIGHT AXILLARY SENTINEL NODE BIOPSY AND RIGHT SEED GUIDED NODE EXCISION;  Surgeon: [name], MD;  Location: [place];  Service: General;  Laterality: Right;  . MYOMECTOMY  1986  . PARTIAL HYSTERECTOMY  1995  . PILONIDAL CYST EXCISION    . PORTACATH PLACEMENT N/A 07/13/2018   Procedure: INSERTION PORT-A-CATH WITH ULTRASOUND;  Surgeon: [name], MD;  Location: [place];  Service: General;  Laterality: N/A;  . TONSILLECTOMY  2002  . UVULOPALATOPHARYNGOPLASTY, TONSILLECTOMY AND SEPTOPLASTY      Allergies: Patient has no known allergies.  Medications: Prior to Admission medications   Medication Sig Start Date End Date Taking? Authorizing Provider  celecoxib (CELEBREX) 200 MG capsule Take 200 mg by mouth daily.   Yes [provider]  cetirizine (ZYRTEC) 10 MG tablet Take 10 mg by mouth daily.   Yes [provider]  furosemide (LASIX) 40 MG tablet Take 80 mg by mouth daily.   Yes [provider]  gabapentin (NEURONTIN) 400 MG capsule Take 400 mg by mouth 2 (two) times daily.   Yes [provider]  levothyroxine (SYNTHROID, LEVOTHROID) 125 MCG tablet Take 125 mcg by mouth daily before breakfast.   Yes [provider]  metFORMIN (GLUCOPHAGE) 1000  MG tablet Take 1,000 mg by mouth 2 (two) times daily with a meal.   Yes [provider]  methocarbamol (ROBAXIN) 500 MG tablet Take 1 tablet (500 mg total) by mouth every 6 (six) hours as needed for muscle spasms. 06/22/18  Yes [name], MD  methocarbamol (ROBAXIN) 500 MG tablet Take 1 tablet (500 mg total) by mouth 3 (three) times daily. 07/14/18  Yes [name], MD  potassium chloride (K-DUR,KLOR-CON) 10 MEQ tablet Take 20 mEq by mouth daily.    Yes [provider]    rosuvastatin (CRESTOR) 40 MG tablet Take 40 mg by mouth at bedtime.    Yes [provider]  traMADol (ULTRAM) 50 MG tablet Take 50 mg by mouth every 6 (six) hours as needed for pain. 06/30/18  Yes [provider]  aspirin EC 81 MG tablet Take 81 mg by mouth daily.    [provider]  diazepam (VALIUM) 5 MG tablet Take 1 tablet (5mg ) po 1hr prior to scheduled MRI, and 15-41min prior to MRI. 07/06/18   [name], MD  letrozole [facility]) 2.5 MG tablet Take 2.5 mg by mouth daily. 06/23/18   [provider]  lidocaine-prilocaine (EMLA) cream Apply to affected area once 06/29/18   [name], MD  LORazepam (ATIVAN) 0.5 MG tablet Take 1 tablet (0.5 mg total) by mouth at bedtime as needed (Nausea or vomiting). 06/29/18   [name], MD  ondansetron (ZOFRAN) 8 MG tablet Take 1 tablet (8 mg total) by mouth 2 (two) times daily as needed. Start on the third day after chemotherapy. 06/29/18   [name], MD  oxyCODONE (OXY IR/ROXICODONE) 5 MG immediate release tablet Take 1 tablet (5 mg total) by mouth every 4 (four) hours as needed for moderate pain. Patient not taking: Reported on 07/06/2018 06/22/18   [name], MD  oxyCODONE (OXY IR/ROXICODONE) 5 MG immediate release tablet Take 1-2 tablets (5-10 mg total) by mouth every 4 (four) hours as needed for moderate pain. 07/14/18   [name], MD  prochlorperazine (COMPAZINE) 10 MG tablet Take 1 tablet (10 mg total) by mouth every 6 (six) hours as needed (Nausea or vomiting). 06/29/18   [name], MD     Family History  Problem Relation Age of Onset  . Colon cancer Mother 59  . Colon cancer Sister 57  . Colon cancer Brother        ex early 68's  . Breast cancer Other        40's    Social History   Socioeconomic History  . Marital status: Married    Spouse name: Not on file  . Number of children: Not on file  . Years of education: Not on file  . Highest education level: Not on file   Occupational History  . Not on file  Social Needs  . Financial resource strain: Not on file  . Food insecurity:    Worry: Not on file    Inability: Not on file  . Transportation needs:    Medical: Not on file    Non-medical: Not on file  Tobacco Use  . Smoking status: Never Smoker  . Smokeless tobacco: Never Used  Substance and Sexual Activity  . Alcohol use: Not Currently  . Drug use: Never  . Sexual activity: Not on file  Lifestyle  . Physical activity:    Days per week: Not on file    Minutes per session: Not on file  . Stress: Not on file  Relationships  . Social connections:    Talks on phone: Not on file    Gets together: Not on file    Attends religious service: Not on file    Active member of club or organization: Not on file    Attends meetings of clubs or organizations: Not on file    Relationship status: Not on file  Other Topics Concern  . Not on file  Social History Narrative  . Not on file     Review of Systems: A 12 point ROS discussed and pertinent positives are indicated in the HPI above.  All other systems are negative.  Review of Systems  Constitutional: Positive for appetite change (Decreased). Negative for chills, fever and unexpected weight change.  Respiratory: Negative for cough and shortness of breath.   Cardiovascular: Negative for chest pain.  Gastrointestinal: Negative for abdominal pain, blood in stool, diarrhea, nausea and vomiting.  Genitourinary: Negative for dysuria and hematuria.  Neurological: Negative for dizziness, syncope and headaches.  Psychiatric/Behavioral: Negative for confusion.    Vital Signs: BP (!) 152/76   Pulse 94   Temp 98.1 F (36.7 C) (Oral)   Resp 18   Ht 5\' 6"  (1.676 m)   Wt 215 lb (97.5 kg)   SpO2 97%   BMI 34.70 kg/m   Physical Exam Vitals signs reviewed.  Constitutional:      General: She is not in acute distress. HENT:     Head: Normocephalic.  Cardiovascular:     Rate and Rhythm: Normal  rate and regular rhythm.     Comments: (+) right mastectomy Pulmonary:     Effort: Pulmonary effort is normal.     Breath sounds: Normal breath sounds.  Abdominal:     General: There is no distension.     Palpations: Abdomen is soft.     Tenderness: There is no abdominal tenderness.  Skin:    General: Skin is warm and dry.  Neurological:     Mental Status: She is alert and oriented to person, place, and time.  Psychiatric:        Mood and Affect: Mood normal.        Behavior: Behavior normal.        Thought Content: Thought content normal.        Judgment: Judgment normal.      MD Evaluation Airway: WNL Heart: WNL Abdomen: WNL Chest/ Lungs: WNL ASA  Classification: 3 Mallampati/Airway Score: Two   Imaging: Ct Chest W Contrast  Result Date: 07/02/2018 CLINICAL DATA:  Right breast cancer, mastectomy last week with positive lymph nodes. EXAM: CT CHEST, ABDOMEN, AND PELVIS WITH CONTRAST TECHNIQUE: Multidetector CT imaging of the chest, abdomen and pelvis was performed following the standard protocol during bolus administration of intravenous contrast. CONTRAST:  175mL OMNIPAQUE IOHEXOL 300 MG/ML  SOLN COMPARISON:  CT abdomen 03/15/2018 and CT abdomen pelvis 02/10/2018. FINDINGS: CT CHEST FINDINGS Cardiovascular: Atherosclerotic calcification of the aorta. Pulmonic trunk is borderline enlarged. Heart size normal. No pericardial effusion. Mediastinum/Nodes: No pathologically enlarged mediastinal, hilar, axillary or internal mammary lymph nodes. Small amount of fluid and air is seen in the right axilla, with a right anterior chest wall subcutaneous drain terminating in the adjacent fat. Esophagus is grossly unremarkable. Lungs/Pleura: Millimetric upper lobe pulmonary nodules are nonspecific and measure 4 mm or less in size. Minimal scarring in the lingula. No pleural fluid. Airway is unremarkable. Musculoskeletal: Recent right mastectomy with a surgical drain in place. A fluid collection  adjacent to the lateral  right pectoralis musculature measures 1.4 x 3.0 cm. Minimal additional fluid, stranding and air in the inferior right axilla. No worrisome lytic or sclerotic lesions. CT ABDOMEN PELVIS FINDINGS Hepatobiliary: Low-attenuation lesions in the liver measure up to 3.6 cm in the left hepatic lobe, as on 02/10/2018. Cholecystectomy with prominence of the common bile duct, as before. Pancreas: Negative. Spleen: Negative. Adrenals/Urinary Tract: Right adrenal gland is unremarkable. 1.8 cm left adrenal nodule, previously characterized as an adenoma on 03/15/2018. Right kidney is unremarkable. There is a vague area of low attenuation in the posteromedial aspect of the upper pole left kidney, measuring roughly 1.3 cm, best seen on delayed imaging (series 7, image 12). Adjacent perinephric stranding. In retrospect, findings likely present on 02/10/2018. Ureters are decompressed. Bladder is low in volume. Stomach/Bowel: Stomach is decompressed. Nonobstructed small bowel extends in to an infraumbilical midline pelvic hernia which has a 5.4 cm neck. Small bowel, appendix and colon are otherwise unremarkable. Stool is seen throughout the colon, indicative of constipation. Vascular/Lymphatic: Atherosclerotic calcification of the aorta without aneurysm. No pathologically enlarged lymph nodes. Reproductive: Hysterectomy.  No adnexal mass. Other: Midline infraumbilical ventral hernia contains unobstructed small bowel and has a 5.4 cm neck. No free fluid. Mesenteries and peritoneum are otherwise unremarkable. Tiny periumbilical hernia contains fat. Musculoskeletal: Degenerative changes in the spine. No worrisome lytic or sclerotic lesions. Favor a bone island in the left superior pubic ramus. IMPRESSION: 1. Postoperative changes of right mastectomy, as described above, without evidence of metastatic disease. 2. Subtle hypoattenuating lesion in the upper pole left kidney can be characterized as a simple cyst.  Malignancy can not be excluded. MR abdomen without and with contrast is recommended in further evaluation. These results will be called to the ordering clinician or representative by the Radiologist Assistant, and communication documented in the PACS or zVision Dashboard. 3. Left adrenal adenoma. 4. Midline infraumbilical hernia contains unobstructed small bowel. 5.  Aortic atherosclerosis (ICD10-170.0). Electronically Signed   By: Lorin Picket M.D.   On: 07/02/2018 09:33   Mr Abdomen W QA Contrast  Result Date: 07/08/2018 CLINICAL DATA:  Breast cancer. Renal lesion identified upper pole left kidney on recent CT. EXAM: MRI ABDOMEN WITHOUT AND WITH CONTRAST TECHNIQUE: Multiplanar multisequence MR imaging of the abdomen was performed both before and after the administration of intravenous contrast. CONTRAST:  10 cc Gadavist COMPARISON:  CT scan 07/02/2018 FINDINGS: Lower chest: Unremarkable Hepatobiliary: Scattered cysts are identified in the liver. Index cyst in the medial segment left liver measures 3.6 cm. Multiple very tiny hypo enhancing foci in the liver parenchyma are too small to characterize. There is no suspicious or worrisome liver abnormality. Gallbladder surgically absent. No intrahepatic or extrahepatic biliary dilation. Pancreas: No focal mass lesion. No dilatation of the main duct. No intraparenchymal cyst. No peripancreatic edema. Spleen:  No splenomegaly. No focal mass lesion. Adrenals/Urinary Tract: 14 mm left adrenal nodule shows loss of signal intensity on out of phase T1 imaging compatible with adenoma. Right adrenal gland unremarkable. Right kidney unremarkable. The left kidney has a 7 mm cyst in the interpolar aspect of the posterior kidney. Immediately cranial to this cyst is a second lesion it measures about 12 mm (image 33 of axial first phase postcontrast imaging series 901) this lesion is quite subtle but does appear to show diffuse enhancement after IV contrast administration in  corresponds to the lesion seen on the recent CT. Stomach/Bowel: Stomach is nondistended. No gastric wall thickening. No evidence of outlet obstruction. Duodenum is normally positioned  as is the ligament of Treitz. No small bowel or colonic dilatation within the visualized abdomen. Vascular/Lymphatic: No abdominal aortic aneurysm. No abdominal lymphadenopathy. Other:  No intraperitoneal free fluid. Musculoskeletal: No abnormal marrow enhancement within the visualized bony anatomy. IMPRESSION: 1. 12 mm subtle lesion subcapsular aspect of the posterior upper interpolar region of the left kidney. This corresponds to the abnormality seen on recent CT scan and does appear to enhance after IV contrast administration on today's MRI. As such, small neoplasm is a concern. Urology consultation recommended. 2. 7 mm lesion in the posterior left kidney immediately caudal to the above described lesion has high T2 signal in shows no enhancement after IV contrast administration compatible with cyst. 3. 14 mm left adrenal adenoma. Electronically Signed   By: Misty Stanley M.D.   On: 07/08/2018 17:21   Ct Abdomen Pelvis W Contrast  Result Date: 07/02/2018 CLINICAL DATA:  Right breast cancer, mastectomy last week with positive lymph nodes. EXAM: CT CHEST, ABDOMEN, AND PELVIS WITH CONTRAST TECHNIQUE: Multidetector CT imaging of the chest, abdomen and pelvis was performed following the standard protocol during bolus administration of intravenous contrast. CONTRAST:  187mL OMNIPAQUE IOHEXOL 300 MG/ML  SOLN COMPARISON:  CT abdomen 03/15/2018 and CT abdomen pelvis 02/10/2018. FINDINGS: CT CHEST FINDINGS Cardiovascular: Atherosclerotic calcification of the aorta. Pulmonic trunk is borderline enlarged. Heart size normal. No pericardial effusion. Mediastinum/Nodes: No pathologically enlarged mediastinal, hilar, axillary or internal mammary lymph nodes. Small amount of fluid and air is seen in the right axilla, with a right anterior chest  wall subcutaneous drain terminating in the adjacent fat. Esophagus is grossly unremarkable. Lungs/Pleura: Millimetric upper lobe pulmonary nodules are nonspecific and measure 4 mm or less in size. Minimal scarring in the lingula. No pleural fluid. Airway is unremarkable. Musculoskeletal: Recent right mastectomy with a surgical drain in place. A fluid collection adjacent to the lateral right pectoralis musculature measures 1.4 x 3.0 cm. Minimal additional fluid, stranding and air in the inferior right axilla. No worrisome lytic or sclerotic lesions. CT ABDOMEN PELVIS FINDINGS Hepatobiliary: Low-attenuation lesions in the liver measure up to 3.6 cm in the left hepatic lobe, as on 02/10/2018. Cholecystectomy with prominence of the common bile duct, as before. Pancreas: Negative. Spleen: Negative. Adrenals/Urinary Tract: Right adrenal gland is unremarkable. 1.8 cm left adrenal nodule, previously characterized as an adenoma on 03/15/2018. Right kidney is unremarkable. There is a vague area of low attenuation in the posteromedial aspect of the upper pole left kidney, measuring roughly 1.3 cm, best seen on delayed imaging (series 7, image 12). Adjacent perinephric stranding. In retrospect, findings likely present on 02/10/2018. Ureters are decompressed. Bladder is low in volume. Stomach/Bowel: Stomach is decompressed. Nonobstructed small bowel extends in to an infraumbilical midline pelvic hernia which has a 5.4 cm neck. Small bowel, appendix and colon are otherwise unremarkable. Stool is seen throughout the colon, indicative of constipation. Vascular/Lymphatic: Atherosclerotic calcification of the aorta without aneurysm. No pathologically enlarged lymph nodes. Reproductive: Hysterectomy.  No adnexal mass. Other: Midline infraumbilical ventral hernia contains unobstructed small bowel and has a 5.4 cm neck. No free fluid. Mesenteries and peritoneum are otherwise unremarkable. Tiny periumbilical hernia contains fat.  Musculoskeletal: Degenerative changes in the spine. No worrisome lytic or sclerotic lesions. Favor a bone island in the left superior pubic ramus. IMPRESSION: 1. Postoperative changes of right mastectomy, as described above, without evidence of metastatic disease. 2. Subtle hypoattenuating lesion in the upper pole left kidney can be characterized as a simple cyst. Malignancy can not be  excluded. MR abdomen without and with contrast is recommended in further evaluation. These results will be called to the ordering clinician or representative by the Radiologist Assistant, and communication documented in the PACS or zVision Dashboard. 3. Left adrenal adenoma. 4. Midline infraumbilical hernia contains unobstructed small bowel. 5.  Aortic atherosclerosis (ICD10-170.0). Electronically Signed   By: Lorin Picket M.D.   On: 07/02/2018 09:33   Dg Chest Port 1 View  Result Date: 07/13/2018 CLINICAL DATA:  Post failed RIGHT side Port-A-Cath placement EXAM: PORTABLE CHEST 1 VIEW COMPARISON:  Portable exam 1703 hours compared to CT chest of 07/02/2018 FINDINGS: Upper normal heart size. Mediastinal contours and pulmonary vascularity normal. Atherosclerotic calcification aorta. Bibasilar atelectasis without pulmonary infiltrate, pleural effusion or pneumothorax. Osseous structures unremarkable. Surgical drain and surgical clips noted at the RIGHT axilla. Post RIGHT mastectomy. IMPRESSION: Bibasilar atelectasis. No pneumothorax. Electronically Signed   By: Lavonia Dana M.D.   On: 07/13/2018 17:30    Labs:  CBC: Recent Labs    06/14/18 1525 07/08/18 1430 07/14/18 0207 07/29/18 0836  WBC 6.9 8.1 8.8 6.1  HGB 11.3* 11.0* 10.3* 11.2*  HCT 36.1 35.4* 32.6* 36.8  PLT 228 271 242 235    COAGS: Recent Labs    07/29/18 0836  INR 1.03    BMP: Recent Labs    06/14/18 1525 06/22/18 0347 07/14/18 0207 07/29/18 0836  NA 141 138 138 141  K 3.6 3.8 3.8 3.5  CL 104 105 102 103  CO2 26 26 25 25   GLUCOSE 173*  149* 257* 117*  BUN 16 17 14 18   CALCIUM 9.0 8.5* 8.7* 9.2  CREATININE 1.27* 1.20* 1.01* 0.95  GFRNONAA 44* 47* 58* >60  GFRAA 51* 55* >60 >60    LIVER FUNCTION TESTS: No results for input(s): BILITOT, AST, ALT, ALKPHOS, PROT, ALBUMIN in the last 8760 hours.  TUMOR MARKERS: No results for input(s): AFPTM, CEA, CA199, CHROMGRNA in the last 8760 hours.  Assessment and Plan:  Patient with right sided breast cancer s/p right mastectomy and right axillary lymph node dissection who presents today for port placement. Right sided port placement was attempted by surgery on 07/13/18, however this was ultimately unsuccessful and request was made for port placement in IR.  Patient has been NPO since 11 pm last night, she does not take blood thinning medications. Afebrile, WBC 6.1, hgb 11.2, plt 235, INR 1.03, creatinine 0.95.  Risks and benefits of image guided port-a-catheter placement was discussed with the patient including, but not limited to bleeding, infection, pneumothorax, or fibrin sheath development and need for additional procedures.  All of the patient's questions were answered, patient is agreeable to proceed.  Consent signed and in chart.  Thank you for this interesting consult.  I greatly enjoyed meeting Charlene Perry [facility] and look forward to participating in their care.  A copy of this report was sent to the requesting provider on this date.  Electronically Signed: Joaquim Nam, PA-C 07/29/2018, 9:48 AM   I spent a total of  30 Minutes   in face to face in clinical consultation, greater than 50% of which was counseling/coordinating care for port placement.

## 2018-07-29 NOTE — Sedation Documentation (Signed)
Patient is resting comfortably. Feels nervous

## 2018-07-29 NOTE — Procedures (Signed)
Interventional Radiology Procedure:   Indications: Right breast cancer and metastatic to right axilla.  Procedure: Port placement  Findings: Left jugular Power Port, tip at [employer] junction  Complications: None     EBL: Minimal  Plan: Discharge to home in one hour.     Kati Riggenbach R. Anselm Pancoast, MD  Pager: [phone]

## 2018-07-29 NOTE — Sedation Documentation (Addendum)
Patient is resting comfortably. Feels the lidocaine

## 2018-07-29 NOTE — Sedation Documentation (Addendum)
Patient denies pain and is resting comfortably.  

## 2018-08-03 ENCOUNTER — Ambulatory Visit: Payer: BLUE CROSS/BLUE SHIELD | Admitting: Hematology and Oncology

## 2018-08-03 ENCOUNTER — Ambulatory Visit: Payer: BLUE CROSS/BLUE SHIELD

## 2018-08-03 ENCOUNTER — Other Ambulatory Visit: Payer: BLUE CROSS/BLUE SHIELD

## 2018-08-04 ENCOUNTER — Ambulatory Visit: Payer: BLUE CROSS/BLUE SHIELD | Admitting: Rehabilitation

## 2018-08-06 NOTE — Progress Notes (Signed)
Patient Care Team: [name], MD as PCP - General (Family Medicine)  DIAGNOSIS:    ICD-10-CM   1. Malignant neoplasm of upper-outer quadrant of right breast in female, estrogen receptor positive ([place]) C50.411    Z17.0     SUMMARY OF ONCOLOGIC HISTORY:   Malignant neoplasm of upper-outer quadrant of right breast in female, estrogen receptor positive ([place])   11/12/2017 Initial Diagnosis    Palpable lesion in the right breast upper outer quadrant mammogram and ultrasound revealed 1.4 cm tumor ultrasound-guided biopsy revealed grade 2 IDC ER 3+, PR 3+, HER-2 negative with a Ki-67 of 10%.  Right axillary lymph node biopsy positive, T1cN1 stage I a clinical stage    12/23/2017 Cancer Staging    Staging form: Breast, AJCC 8th Edition - Clinical: Stage IB (cT1c, cN1, cM0, G2, ER+, PR+, HER2-) - Signed by Gardenia Phlegm, NP on 12/23/2017    01/10/2018 Breast MRI    Biopsy-proven malignancy UOQ right breast measuring 6 cm.  Anterior inferior to this is a 1 cm mass which needs biopsy.  Focal non-mass enhancement LOQ retroareolar right breast, left breast 7 mm irregular enhancement, 2 suspicious right axillary lymph nodes    01/27/2018 Procedure    Right breast biopsy UOQ: Grade 1 ILC, ALH Right breast biopsy 6:00: LCIS Left breast biopsy: Benign    06/21/2018 Surgery    Rt Mastectomy: 7 cm fibrotic mass ILC Grade 2, 5/6 LN Positive, ER 90-100%, PR 5%-70%, Ki 67 2-10%, Her 2 Neg, T3N2    07/13/2018 Surgery    Right axillary lymph node dissection: 3/10 lymph nodes positive making total number of lymph nodes 8/16    08/10/2018 -  Chemotherapy    Adjuvant chemotherapy with dose dense Adriamycin and Cytoxan x4 followed by Taxol weekly x12      CHIEF COMPLIANT: Cycle 1 Adriamycin and Cytoxan  INTERVAL HISTORY: Charlene Perry is a 68 y.o. with above-mentioned history of right breast cancer who underwent a right mastectomy and is to begin adjuvant chemotherapy with  dose dense Adriamycin and Cytoxan today. An ECHO on 07/02/18 showed an ejection fraction in the range of 60-65%. A CT CAP from 07/02/18 showed no evidence of metastatic disease in the breast but indicated a kidney lesion that, on a MR abdomen on 07/08/18 was found to be of concern for metastatic disease and recommended a urology evaluation. Her port was inserted by Dr. Donne Hazel on 07/09/18 and chemotherapy was delayed due to healing from surgery and port placement. She presents to the clinic today with two family members. She had difficulty sleeping last night due to worrying about treatment. She reviewed her medication list with me.   REVIEW OF SYSTEMS:   Constitutional: Denies fevers, chills or abnormal weight loss (+) difficulty sleeping Eyes: Denies blurriness of vision Ears, nose, mouth, throat, and face: Denies mucositis or sore throat Respiratory: Denies cough, dyspnea or wheezes Cardiovascular: Denies palpitation, chest discomfort Gastrointestinal: Denies nausea, heartburn or change in bowel habits Skin: Denies abnormal skin rashes Lymphatics: Denies new lymphadenopathy or easy bruising Neurological: Denies numbness, tingling or new weaknesses Behavioral/Psych: Mood is stable, no new changes  Extremities: No lower extremity edema Breast: denies any pain or lumps or nodules in either breasts All other systems were reviewed with the patient and are negative.  I have reviewed the past medical history, past surgical history, social history and family history with the patient and they are unchanged from previous note.  ALLERGIES:  has No Known Allergies.  MEDICATIONS:  Current Outpatient Medications  Medication Sig Dispense Refill  . aspirin EC 81 MG tablet Take 81 mg by mouth daily.    . celecoxib (CELEBREX) 200 MG capsule Take 200 mg by mouth daily.    . cetirizine (ZYRTEC) 10 MG tablet Take 10 mg by mouth daily.    . furosemide (LASIX) 40 MG tablet Take 80 mg by mouth daily.    Marland Kitchen  gabapentin (NEURONTIN) 400 MG capsule Take 400 mg by mouth 2 (two) times daily.    Marland Kitchen levothyroxine (SYNTHROID, LEVOTHROID) 125 MCG tablet Take 125 mcg by mouth daily before breakfast.    . lidocaine-prilocaine (EMLA) cream Apply to affected area once 30 g 3  . LORazepam (ATIVAN) 0.5 MG tablet Take 1 tablet (0.5 mg total) by mouth at bedtime as needed (Nausea or vomiting). 30 tablet 0  . metFORMIN (GLUCOPHAGE) 1000 MG tablet Take 1,000 mg by mouth 2 (two) times daily with a meal.    . ondansetron (ZOFRAN) 8 MG tablet Take 1 tablet (8 mg total) by mouth 2 (two) times daily as needed. Start on the third day after chemotherapy. 30 tablet 1  . potassium chloride (K-DUR,KLOR-CON) 10 MEQ tablet Take 20 mEq by mouth daily.     . prochlorperazine (COMPAZINE) 10 MG tablet Take 1 tablet (10 mg total) by mouth every 6 (six) hours as needed (Nausea or vomiting). 30 tablet 1  . rosuvastatin (CRESTOR) 40 MG tablet Take 40 mg by mouth at bedtime.      No current facility-administered medications for this visit.    Facility-Administered Medications Ordered in Other Visits  Medication Dose Route Frequency Provider Last Rate Last Dose  . sodium chloride flush (NS) 0.9 % injection 10 mL  10 mL Intravenous PRN [name], MD   10 mL at 08/10/18 1125    PHYSICAL EXAMINATION: ECOG PERFORMANCE STATUS: 1 - Symptomatic but completely ambulatory  Vitals:   08/10/18 1140  BP: (!) 150/60  Pulse: (!) 101  Resp: 18  Temp: 98.7 F (37.1 C)  SpO2: 96%   Filed Weights   08/10/18 1140  Weight: 220 lb (99.8 kg)    GENERAL: alert, no distress and comfortable SKIN: skin color, texture, turgor are normal, no rashes or significant lesions EYES: normal, Conjunctiva are pink and non-injected, sclera clear OROPHARYNX: no exudate, no erythema and lips, buccal mucosa, and tongue normal  NECK: supple, thyroid normal size, non-tender, without nodularity LYMPH: no palpable lymphadenopathy in the cervical, axillary or  inguinal LUNGS: clear to auscultation and percussion with normal breathing effort HEART: regular rate & rhythm and no murmurs and no lower extremity edema ABDOMEN: abdomen soft, non-tender and normal bowel sounds MUSCULOSKELETAL: no cyanosis of digits and no clubbing  NEURO: alert & oriented x 3 with fluent speech, no focal motor/sensory deficits EXTREMITIES: No lower extremity edema  LABORATORY DATA:  I have reviewed the data as listed CMP Latest Ref Rng & Units 07/29/2018 07/14/2018 06/22/2018  Glucose 70 - 99 mg/dL 117(H) 257(H) 149(H)  BUN 8 - 23 mg/dL _0 Creatinine 0.44 - 1.00 mg/dL 0.95 1.01(H) 1.20(H)  Sodium 135 - 145 mmol/L 141 138 138  Potassium 3.5 - 5.1 mmol/L 3.5 3.8 3.8  Chloride 98 - 111 mmol/L 103 102 105  CO2 22 - 32 mmol/L _1 Calcium 8.9 - 10.3 mg/dL 9.2 8.7(L) 8.5(L)    Lab Results  Component Value Date   WBC 6.3 08/10/2018   HGB 10.7 (L) 08/10/2018  HCT 34.4 (L) 08/10/2018   MCV 85.8 08/10/2018   PLT 195 08/10/2018   NEUTROABS 4.1 08/10/2018    ASSESSMENT & PLAN:  Malignant neoplasm of upper-outer quadrant of right breast in female, estrogen receptor positive ([place]) 06/21/17: Rt Mastectomy: 7 cm fibrotic mass ILC Grade 2, 5/6 LN Positive, ER 90-100%, PR 5%-70%, Ki 67 2-10%, Her 2 Neg, T3N2  Pathology counseling: I discussed the final pathology report of the patient provided  a copy of this report. I discussed the margins as well as lymph node surgeries. We also discussed the final staging along with previously performed ER/PR and HER-2/neu testing. 07/13/2018: Axillary lymph node dissection right: 3/10 lymph nodes positive making a total of 8/16 lymph nodes positive  Recommendation: 1. Adj chemo 2. Adj RT 3. Adj Anti estrogen therapy  CT CAP: 07/02/2018: Subtle hypoattenuating lesion upper pole left kidney could be a cyst, urology consulted, left adrenal  adenoma. --------------------------------------------------------------------------------------------------------------------- Treatment plan: Cycle 1 day 1 dose dense Adriamycin and Cytoxan Labs reviewed Chemo consent obtained Chemo education completed Echocardiogram: 07/02/2018: EF 60 to 65% Antiemetics reviewed  Return to clinic in 1 week for toxicity check    No orders of the defined types were placed in this encounter.  The patient has a good understanding of the overall plan. she agrees with it. she will call with any problems that may develop before the next visit here.  [name], MD 08/10/2018  Julious Oka Dorshimer am acting as scribe for Dr. [name].  I have reviewed the above documentation for accuracy and completeness, and I agree with the above.

## 2018-08-09 ENCOUNTER — Encounter: Payer: Self-pay | Admitting: Hematology and Oncology

## 2018-08-09 ENCOUNTER — Telehealth: Payer: Self-pay | Admitting: Hematology and Oncology

## 2018-08-09 NOTE — Progress Notes (Signed)
Called pt to introduce myself as her Arboriculturist.  Unfortunately there aren't any foundations offering copay assistance for her Dx and the type of ins she has.  I offered the [employer], went over what it covers and gave her the income requirement.  She stated she's above the income so she doesn't qualify for the grant at this time.  I will meet her on 08/10/18 to give her my card for any questions or concerns she may have in the future.

## 2018-08-09 NOTE — Telephone Encounter (Signed)
Printed medical records for [employer], Release [id number]

## 2018-08-10 ENCOUNTER — Encounter: Payer: Self-pay | Admitting: *Deleted

## 2018-08-10 ENCOUNTER — Inpatient Hospital Stay: Payer: BLUE CROSS/BLUE SHIELD

## 2018-08-10 ENCOUNTER — Telehealth: Payer: Self-pay | Admitting: Hematology and Oncology

## 2018-08-10 ENCOUNTER — Inpatient Hospital Stay: Payer: BLUE CROSS/BLUE SHIELD | Attending: Hematology and Oncology | Admitting: Hematology and Oncology

## 2018-08-10 DIAGNOSIS — Z5111 Encounter for antineoplastic chemotherapy: Secondary | ICD-10-CM | POA: Diagnosis not present

## 2018-08-10 DIAGNOSIS — Z170 Estrogen receptor positive status [ER+]: Secondary | ICD-10-CM | POA: Diagnosis not present

## 2018-08-10 DIAGNOSIS — Z791 Long term (current) use of non-steroidal anti-inflammatories (NSAID): Secondary | ICD-10-CM | POA: Insufficient documentation

## 2018-08-10 DIAGNOSIS — C773 Secondary and unspecified malignant neoplasm of axilla and upper limb lymph nodes: Secondary | ICD-10-CM | POA: Insufficient documentation

## 2018-08-10 DIAGNOSIS — E119 Type 2 diabetes mellitus without complications: Secondary | ICD-10-CM | POA: Insufficient documentation

## 2018-08-10 DIAGNOSIS — Z7982 Long term (current) use of aspirin: Secondary | ICD-10-CM

## 2018-08-10 DIAGNOSIS — Z5189 Encounter for other specified aftercare: Secondary | ICD-10-CM | POA: Insufficient documentation

## 2018-08-10 DIAGNOSIS — C50411 Malignant neoplasm of upper-outer quadrant of right female breast: Secondary | ICD-10-CM | POA: Insufficient documentation

## 2018-08-10 DIAGNOSIS — Z79899 Other long term (current) drug therapy: Secondary | ICD-10-CM | POA: Diagnosis not present

## 2018-08-10 DIAGNOSIS — Z7984 Long term (current) use of oral hypoglycemic drugs: Secondary | ICD-10-CM | POA: Insufficient documentation

## 2018-08-10 DIAGNOSIS — Z95828 Presence of other vascular implants and grafts: Secondary | ICD-10-CM

## 2018-08-10 DIAGNOSIS — G479 Sleep disorder, unspecified: Secondary | ICD-10-CM | POA: Diagnosis not present

## 2018-08-10 LAB — CMP (CANCER CENTER ONLY)
ALT: 17 U/L (ref 0–44)
AST: 18 U/L (ref 15–41)
Albumin: 3.6 g/dL (ref 3.5–5.0)
Alkaline Phosphatase: 81 U/L (ref 38–126)
Anion gap: 11 (ref 5–15)
BUN: 15 mg/dL (ref 8–23)
CO2: 28 mmol/L (ref 22–32)
CREATININE: 0.9 mg/dL (ref 0.44–1.00)
Calcium: 9.1 mg/dL (ref 8.9–10.3)
Chloride: 106 mmol/L (ref 98–111)
GFR, Estimated: >60 mL/min (ref >60)
Glucose, Bld: 140 mg/dL — ABNORMAL HIGH (ref 70–99)
Potassium: 4 mmol/L (ref 3.5–5.1)
Sodium: 145 mmol/L (ref 135–145)
Total Bilirubin: 0.3 mg/dL (ref 0.3–1.2)
Total Protein: 6.9 g/dL (ref 6.5–8.1)

## 2018-08-10 LAB — CBC WITH DIFFERENTIAL (CANCER CENTER ONLY)
Abs Immature Granulocytes: 0.02 10*3/uL (ref 0.00–0.07)
BASOS ABS: 0 10*3/uL (ref 0.0–0.1)
Basophils Relative: 0 %
EOS PCT: 4 %
Eosinophils Absolute: 0.2 10*3/uL (ref 0.0–0.5)
HEMATOCRIT: 34.4 % — AB (ref 36.0–46.0)
Hemoglobin: 10.7 g/dL — ABNORMAL LOW (ref 12.0–15.0)
Immature Granulocytes: 0 %
LYMPHS ABS: 1.4 10*3/uL (ref 0.7–4.0)
Lymphocytes Relative: 22 %
MCH: 26.7 pg (ref 26.0–34.0)
MCHC: 31.1 g/dL (ref 30.0–36.0)
MCV: 85.8 fL (ref 80.0–100.0)
Monocytes Absolute: 0.5 10*3/uL (ref 0.1–1.0)
Monocytes Relative: 8 %
Neutro Abs: 4.1 10*3/uL (ref 1.7–7.7)
Neutrophils Relative %: 66 %
Platelet Count: 195 10*3/uL (ref 150–400)
RBC: 4.01 MIL/uL (ref 3.87–5.11)
RDW: 14.3 % (ref 11.5–15.5)
WBC Count: 6.3 10*3/uL (ref 4.0–10.5)
nRBC: 0 % (ref 0.0–0.2)

## 2018-08-10 MED ORDER — SODIUM CHLORIDE 0.9 % IV SOLN
600.0000 mg/m2 | Freq: Once | INTRAVENOUS | Status: AC
  Administered 2018-08-10: 1300 mg via INTRAVENOUS
  Filled 2018-08-10: qty 65

## 2018-08-10 MED ORDER — SODIUM CHLORIDE 0.9% FLUSH
10.0000 mL | INTRAVENOUS | Status: DC | PRN
  Administered 2018-08-10: 10 mL via INTRAVENOUS
  Filled 2018-08-10: qty 10

## 2018-08-10 MED ORDER — SODIUM CHLORIDE 0.9 % IV SOLN
Freq: Once | INTRAVENOUS | Status: AC
  Administered 2018-08-10: 14:00:00 via INTRAVENOUS
  Filled 2018-08-10: qty 5

## 2018-08-10 MED ORDER — HEPARIN SOD (PORK) LOCK FLUSH 100 UNIT/ML IV SOLN
500.0000 [IU] | Freq: Once | INTRAVENOUS | Status: AC | PRN
  Administered 2018-08-10: 500 [IU]
  Filled 2018-08-10: qty 5

## 2018-08-10 MED ORDER — PALONOSETRON HCL INJECTION 0.25 MG/5ML
INTRAVENOUS | Status: AC
  Filled 2018-08-10: qty 5

## 2018-08-10 MED ORDER — DOXORUBICIN HCL CHEMO IV INJECTION 2 MG/ML
60.0000 mg/m2 | Freq: Once | INTRAVENOUS | Status: AC
  Administered 2018-08-10: 130 mg via INTRAVENOUS
  Filled 2018-08-10: qty 65

## 2018-08-10 MED ORDER — SODIUM CHLORIDE 0.9 % IV SOLN
Freq: Once | INTRAVENOUS | Status: DC
  Filled 2018-08-10: qty 250

## 2018-08-10 MED ORDER — SODIUM CHLORIDE 0.9% FLUSH
10.0000 mL | INTRAVENOUS | Status: DC | PRN
  Administered 2018-08-10: 10 mL
  Filled 2018-08-10: qty 10

## 2018-08-10 MED ORDER — PALONOSETRON HCL INJECTION 0.25 MG/5ML
0.2500 mg | Freq: Once | INTRAVENOUS | Status: AC
  Administered 2018-08-10: 0.25 mg via INTRAVENOUS

## 2018-08-10 NOTE — Progress Notes (Signed)
First time Doxo, cytoxan. Pt tolerated both well without complaint. All questions answered.

## 2018-08-10 NOTE — Telephone Encounter (Signed)
Gave avs and calendar ° °

## 2018-08-10 NOTE — Progress Notes (Signed)
FMLA successfully faxed to [employer] at [phone]. Mailed copy to patient address on file.

## 2018-08-10 NOTE — Assessment & Plan Note (Signed)
06/21/17: Rt Mastectomy: 7 cm fibrotic mass ILC Grade 2, 5/6 LN Positive, ER 90-100%, PR 5%-70%, Ki 67 2-10%, Her 2 Neg, T3N2  Pathology counseling: I discussed the final pathology report of the patient provided  a copy of this report. I discussed the margins as well as lymph node surgeries. We also discussed the final staging along with previously performed ER/PR and HER-2/neu testing. 07/13/2018: Axillary lymph node dissection right: 3/10 lymph nodes positive making a total of 8/16 lymph nodes positive  Recommendation: 1. Adj chemo 2. Adj RT 3. Adj Anti estrogen therapy  CT CAP: 07/02/2018: Subtle hypoattenuating lesion upper pole left kidney could be a cyst, urology consulted, left adrenal adenoma. --------------------------------------------------------------------------------------------------------------------- Treatment plan: Cycle 1 day 1 dose dense Adriamycin and Cytoxan Labs reviewed Chemo consent obtained Chemo education completed Echocardiogram: 07/02/2018: EF 60 to 65% Antiemetics reviewed  Return to clinic in 1 week for toxicity check

## 2018-08-10 NOTE — Patient Instructions (Addendum)
[facility] Discharge Instructions for Patients Receiving Chemotherapy  Today you received the following chemotherapy agents Doxorubicin, Cytoxan.  To help prevent nausea and vomiting after your treatment, we encourage you to take your nausea medication. DO NOT TAKE ZOFRAN FOR THREE DAYS AFTER TREATMENT.   If you develop nausea and vomiting that is not controlled by your nausea medication, call the clinic.   BELOW ARE SYMPTOMS THAT SHOULD BE REPORTED IMMEDIATELY:  *FEVER GREATER THAN 100.5 F  *CHILLS WITH OR WITHOUT FEVER  NAUSEA AND VOMITING THAT IS NOT CONTROLLED WITH YOUR NAUSEA MEDICATION  *UNUSUAL SHORTNESS OF BREATH  *UNUSUAL BRUISING OR BLEEDING  TENDERNESS IN MOUTH AND THROAT WITH OR WITHOUT PRESENCE OF ULCERS  *URINARY PROBLEMS  *BOWEL PROBLEMS  UNUSUAL RASH Items with * indicate a potential emergency and should be followed up as soon as possible.  Feel free to call the clinic should you have any questions or concerns. The clinic phone number is [phone].  Please show the [place] at check-in to the Emergency Department and triage nurse.  Cyclophosphamide injection What is this medicine? CYCLOPHOSPHAMIDE (sye kloe FOSS fa mide) is a chemotherapy drug. It slows the growth of cancer cells. This medicine is used to treat many types of cancer like lymphoma, myeloma, leukemia, breast cancer, and ovarian cancer, to name a few. This medicine may be used for other purposes; ask your health care provider or pharmacist if you have questions. COMMON BRAND NAME(S): Cytoxan, Neosar What should I tell my health care provider before I take this medicine? They need to know if you have any of these conditions: -blood disorders -history of other chemotherapy -infection -kidney disease -liver disease -recent or ongoing radiation therapy -tumors in the bone marrow -an unusual or allergic reaction to cyclophosphamide, other chemotherapy, other  medicines, foods, dyes, or preservatives -pregnant or trying to get pregnant -breast-feeding How should I use this medicine? This drug is usually given as an injection into a vein or muscle or by infusion into a vein. It is administered in a hospital or clinic by a specially trained health care professional. Talk to your pediatrician regarding the use of this medicine in children. Special care may be needed. Overdosage: If you think you have taken too much of this medicine contact a poison control center or emergency room at once. NOTE: This medicine is only for you. Do not share this medicine with others. What if I miss a dose? It is important not to miss your dose. Call your doctor or health care professional if you are unable to keep an appointment. What may interact with this medicine? This medicine may interact with the following medications: -amiodarone -amphotericin B -azathioprine -certain antiviral medicines for HIV or AIDS such as protease inhibitors (e.g., indinavir, ritonavir) and zidovudine -certain blood pressure medications such as benazepril, captopril, enalapril, fosinopril, lisinopril, moexipril, monopril, perindopril, quinapril, ramipril, trandolapril -certain cancer medications such as anthracyclines (e.g., daunorubicin, doxorubicin), busulfan, cytarabine, paclitaxel, pentostatin, tamoxifen, trastuzumab -certain diuretics such as chlorothiazide, chlorthalidone, hydrochlorothiazide, indapamide, metolazone -certain medicines that treat or prevent blood clots like warfarin -certain muscle relaxants such as succinylcholine -cyclosporine -etanercept -indomethacin -medicines to increase blood counts like filgrastim, pegfilgrastim, sargramostim -medicines used as general anesthesia -metronidazole -natalizumab This list may not describe all possible interactions. Give your health care provider a list of all the medicines, herbs, non-prescription drugs, or dietary supplements  you use. Also tell them if you smoke, drink alcohol, or use illegal drugs. Some items may interact with  your medicine. What should I watch for while using this medicine? Visit your doctor for checks on your progress. This drug may make you feel generally unwell. This is not uncommon, as chemotherapy can affect healthy cells as well as cancer cells. Report any side effects. Continue your course of treatment even though you feel ill unless your doctor tells you to stop. Drink water or other fluids as directed. Urinate often, even at night. In some cases, you may be given additional medicines to help with side effects. Follow all directions for their use. Call your doctor or health care professional for advice if you get a fever, chills or sore throat, or other symptoms of a cold or flu. Do not treat yourself. This drug decreases your body's ability to fight infections. Try to avoid being around people who are sick. This medicine may increase your risk to bruise or bleed. Call your doctor or health care professional if you notice any unusual bleeding. Be careful brushing and flossing your teeth or using a toothpick because you may get an infection or bleed more easily. If you have any dental work done, tell your dentist you are receiving this medicine. You may get drowsy or dizzy. Do not drive, use machinery, or do anything that needs mental alertness until you know how this medicine affects you. Do not become pregnant while taking this medicine or for 1 year after stopping it. Women should inform their doctor if they wish to become pregnant or think they might be pregnant. Men should not father a child while taking this medicine and for 4 months after stopping it. There is a potential for serious side effects to an unborn child. Talk to your health care professional or pharmacist for more information. Do not breast-feed an infant while taking this medicine. This medicine may interfere with the ability to  have a child. This medicine has caused ovarian failure in some women. This medicine has caused reduced sperm counts in some men. You should talk with your doctor or health care professional if you are concerned about your fertility. If you are going to have surgery, tell your doctor or health care professional that you have taken this medicine. What side effects may I notice from receiving this medicine? Side effects that you should report to your doctor or health care professional as soon as possible: -allergic reactions like skin rash, itching or hives, swelling of the face, lips, or tongue -low blood counts - this medicine may decrease the number of white blood cells, red blood cells and platelets. You may be at increased risk for infections and bleeding. -signs of infection - fever or chills, cough, sore throat, pain or difficulty passing urine -signs of decreased platelets or bleeding - bruising, pinpoint red spots on the skin, black, tarry stools, blood in the urine -signs of decreased red blood cells - unusually weak or tired, fainting spells, lightheadedness -breathing problems -dark urine -dizziness -palpitations -swelling of the ankles, feet, hands -trouble passing urine or change in the amount of urine -weight gain -yellowing of the eyes or skin Side effects that usually do not require medical attention (report to your doctor or health care professional if they continue or are bothersome): -changes in nail or skin color -hair loss -missed menstrual periods -mouth sores -nausea, vomiting This list may not describe all possible side effects. Call your doctor for medical advice about side effects. You may report side effects to FDA at 1-800-FDA-1088. Where should I keep my medicine? This  drug is given in a hospital or clinic and will not be stored at home. NOTE: This sheet is a summary. It may not cover all possible information. If you have questions about this medicine, talk to  your doctor, pharmacist, or health care provider.  2019 Elsevier/Gold Standard (2012-04-16 16:22:58) Doxorubicin injection What is this medicine? DOXORUBICIN (dox oh ROO bi sin) is a chemotherapy drug. It is used to treat many kinds of cancer like leukemia, lymphoma, neuroblastoma, sarcoma, and Wilms' tumor. It is also used to treat bladder cancer, breast cancer, lung cancer, ovarian cancer, stomach cancer, and thyroid cancer. This medicine may be used for other purposes; ask your health care provider or pharmacist if you have questions. COMMON BRAND NAME(S): Adriamycin, Adriamycin PFS, Adriamycin RDF, Rubex What should I tell my health care provider before I take this medicine? They need to know if you have any of these conditions: -heart disease -history of low blood counts caused by a medicine -liver disease -recent or ongoing radiation therapy -an unusual or allergic reaction to doxorubicin, other chemotherapy agents, other medicines, foods, dyes, or preservatives -pregnant or trying to get pregnant -breast-feeding How should I use this medicine? This drug is given as an infusion into a vein. It is administered in a hospital or clinic by a specially trained health care professional. If you have pain, swelling, burning or any unusual feeling around the site of your injection, tell your health care professional right away. Talk to your pediatrician regarding the use of this medicine in children. Special care may be needed. Overdosage: If you think you have taken too much of this medicine contact a poison control center or emergency room at once. NOTE: This medicine is only for you. Do not share this medicine with others. What if I miss a dose? It is important not to miss your dose. Call your doctor or health care professional if you are unable to keep an appointment. What may interact with this medicine? This medicine may interact with the following  medications: -6-mercaptopurine -paclitaxel -phenytoin -St. John's Wort -trastuzumab -verapamil This list may not describe all possible interactions. Give your health care provider a list of all the medicines, herbs, non-prescription drugs, or dietary supplements you use. Also tell them if you smoke, drink alcohol, or use illegal drugs. Some items may interact with your medicine. What should I watch for while using this medicine? This drug may make you feel generally unwell. This is not uncommon, as chemotherapy can affect healthy cells as well as cancer cells. Report any side effects. Continue your course of treatment even though you feel ill unless your doctor tells you to stop. There is a maximum amount of this medicine you should receive throughout your life. The amount depends on the medical condition being treated and your overall health. Your doctor will watch how much of this medicine you receive in your lifetime. Tell your doctor if you have taken this medicine before. You may need blood work done while you are taking this medicine. Your urine may turn red for a few days after your dose. This is not blood. If your urine is dark or brown, call your doctor. In some cases, you may be given additional medicines to help with side effects. Follow all directions for their use. Call your doctor or health care professional for advice if you get a fever, chills or sore throat, or other symptoms of a cold or flu. Do not treat yourself. This drug decreases your body's ability to  fight infections. Try to avoid being around people who are sick. This medicine may increase your risk to bruise or bleed. Call your doctor or health care professional if you notice any unusual bleeding. Talk to your doctor about your risk of cancer. You may be more at risk for certain types of cancers if you take this medicine. Do not become pregnant while taking this medicine or for 6 months after stopping it. Women should  inform their doctor if they wish to become pregnant or think they might be pregnant. Men should not father a child while taking this medicine and for 6 months after stopping it. There is a potential for serious side effects to an unborn child. Talk to your health care professional or pharmacist for more information. Do not breast-feed an infant while taking this medicine. This medicine has caused ovarian failure in some women and reduced sperm counts in some men This medicine may interfere with the ability to have a child. Talk with your doctor or health care professional if you are concerned about your fertility. This medicine may cause a decrease in Co-Enzyme Q-10. You should make sure that you get enough Co-Enzyme Q-10 while you are taking this medicine. Discuss the foods you eat and the vitamins you take with your health care professional. What side effects may I notice from receiving this medicine? Side effects that you should report to your doctor or health care professional as soon as possible: -allergic reactions like skin rash, itching or hives, swelling of the face, lips, or tongue -breathing problems -chest pain -fast or irregular heartbeat -low blood counts - this medicine may decrease the number of white blood cells, red blood cells and platelets. You may be at increased risk for infections and bleeding. -pain, redness, or irritation at site where injected -signs of infection - fever or chills, cough, sore throat, pain or difficulty passing urine -signs of decreased platelets or bleeding - bruising, pinpoint red spots on the skin, black, tarry stools, blood in the urine -swelling of the ankles, feet, hands -tiredness -weakness Side effects that usually do not require medical attention (report to your doctor or health care professional if they continue or are bothersome): -diarrhea -hair loss -mouth sores -nail discoloration or damage -nausea -red colored urine -vomiting This list  may not describe all possible side effects. Call your doctor for medical advice about side effects. You may report side effects to FDA at 1-800-FDA-1088. Where should I keep my medicine? This drug is given in a hospital or clinic and will not be stored at home. NOTE: This sheet is a summary. It may not cover all possible information. If you have questions about this medicine, talk to your doctor, pharmacist, or health care provider.  2019 Elsevier/Gold Standard (2017-01-14 11:01:26)

## 2018-08-11 ENCOUNTER — Ambulatory Visit: Payer: BLUE CROSS/BLUE SHIELD | Admitting: Rehabilitation

## 2018-08-11 ENCOUNTER — Telehealth: Payer: Self-pay

## 2018-08-11 DIAGNOSIS — C50411 Malignant neoplasm of upper-outer quadrant of right female breast: Secondary | ICD-10-CM | POA: Diagnosis not present

## 2018-08-11 DIAGNOSIS — Z170 Estrogen receptor positive status [ER+]: Principal | ICD-10-CM

## 2018-08-11 DIAGNOSIS — M25511 Pain in right shoulder: Secondary | ICD-10-CM

## 2018-08-11 DIAGNOSIS — M25611 Stiffness of right shoulder, not elsewhere classified: Secondary | ICD-10-CM

## 2018-08-11 DIAGNOSIS — Z9189 Other specified personal risk factors, not elsewhere classified: Secondary | ICD-10-CM

## 2018-08-11 NOTE — Telephone Encounter (Signed)
Pt called to report that she had noticed a non raised rash around her cheeks,nose and under her eyes, after treatment yesterday. Pt denies any fevers, chills,n/v, chest pain or sob. Pt states that she is supposed to come back tomorrow for an injection. Suggested that pt take benadryl every 4-6hrs as needed and see if rash improves in the next 24 hrs. If not, pt will be coming in for injection and will assess if will need to be evaluated with symptom management. Pt verbalized understanding.   Pt states that she is taking claritin now, prior to getting injection tomorrow. She is also going to be monitoring for any worsening of symptoms and call our office with any concerns.

## 2018-08-11 NOTE — Therapy (Signed)
[place], [place], [address] Phone: [phone]   Fax:  [phone]  Physical Therapy Treatment  Patient Details  Name: Charlene Perry MRN: [id number] Date of Birth: May 07, 1951 Referring Provider (PT): Dr. Donne Hazel   Encounter Date: 08/11/2018  PT End of Session - 08/11/18 1721    Visit Number  2    Number of Visits  13    Date for PT Re-Evaluation  09/08/18    Authorization Type  no limit    PT Start Time  1430    PT Stop Time  1515    PT Time Calculation (min)  45 min    Activity Tolerance  Patient tolerated treatment well    Behavior During Therapy  [facility] for tasks assessed/performed       Past Medical History:  Diagnosis Date  . Arthritis   . Breast cancer in female [facility])    Right  . Cancer ([facility])   . Diabetes mellitus without complication ([place])   . Family history of breast cancer   . Family history of colon cancer   . Hypertension    takes fluid pills  . Hypothyroidism   . Pink eye disease of left eye    went to eye md and received drops - yesterday.  . Sleep apnea    back in the 1990's  . Vitiligo     Past Surgical History:  Procedure Laterality Date  . AXILLARY LYMPH NODE DISSECTION Right 07/13/2018   Procedure: RIGHT AXILLARY LYMPH NODE DISSECTION;  Surgeon: [name], MD;  Location: [facility];  Service: General;  Laterality: Right;  . BREAST SURGERY     left breast fibro adenoma  . CATARACT EXTRACTION Right 2019  . CERVICAL FUSION N/A    C5 &6  . CESAREAN SECTION    . CHOLECYSTECTOMY  2004-2005  . CYSTECTOMY     patient denies  . EYE SURGERY    . IR IMAGING GUIDED PORT INSERTION  07/29/2018  . MASTECTOMY WITH RADIOACTIVE SEED GUIDED EXCISION AND AXILLARY SENTINEL LYMPH NODE BIOPSY Right 06/21/2018  . MASTECTOMY WITH RADIOACTIVE SEED GUIDED EXCISION AND AXILLARY SENTINEL LYMPH NODE BIOPSY Right 06/21/2018   Procedure: RIGHT TOTAL MASTECTOMY WITH RIGHT AXILLARY  SENTINEL NODE BIOPSY AND RIGHT SEED GUIDED NODE EXCISION;  Surgeon: [name], MD;  Location: [facility];  Service: General;  Laterality: Right;  . MYOMECTOMY  1986  . PARTIAL HYSTERECTOMY  1995  . PILONIDAL CYST EXCISION    . PORTACATH PLACEMENT N/A 07/13/2018   Procedure: INSERTION PORT-A-CATH WITH ULTRASOUND;  Surgeon: [name], MD;  Location: [facility];  Service: General;  Laterality: N/A;  . TONSILLECTOMY  2002  . UVULOPALATOPHARYNGOPLASTY, TONSILLECTOMY AND SEPTOPLASTY      There were no vitals filed for this visit.  Subjective Assessment - 08/11/18 1443    Subjective  I had my port put in for chemo and had the first infusion.  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 History of DM, cervical fusion, and partial hysterectomy.  Pt lives in [place], [place].  Chemotherapy delayed due to healing and will now begin 08/10/18. Radiation after chemotherapy. Investigating kidney lesion on the Rt with decreased GFR    Currently in Pain?  No/denies  [facility] PT Assessment - 08/11/18 0001      AROM   Right Shoulder Flexion  136 Degrees    Right Shoulder ABduction  137 Degrees    Left Shoulder Flexion  145 Degrees    Left Shoulder ABduction  145 Degrees                   [facility] Adult PT Treatment/Exercise - 08/11/18 0001      Self-Care   Self-Care  Other Self-Care Comments    Other Self-Care Comments   lengthy discussion today about lymphedema as pt is very concerned about developing this.  Went over chance of occurence, what to look for, and risk reduction.  Gave pt a script to have signed b Dr. Lindi Adie for a sleeve and gauntlet as pt is flying to [place] soon after radiation. Given hanout      Exercises   Other Exercises   gave pt dowel rod standing exercises and  doorway stretch per instruction section.  Each performed with cueing and instruction      Manual Therapy   Manual Therapy  Edema management;Passive ROM    Passive ROM  to the Rt shoulder to tolerance all directions             PT Education - 08/11/18 1720    Education Details  new HEP, lymphedema risk reduction    Person(s) Educated  Patient    Methods  Explanation;Demonstration;Tactile cues;Verbal cues;Handout    Comprehension  Verbalized understanding;Returned demonstration;Verbal cues required;Tactile cues required          PT Long Term Goals - 08/11/18 1723      PT LONG TERM GOAL #1   Title  Pt will be able to tolerate radiation position on the Rt side    Status  Partially Met      PT LONG TERM GOAL #2   Title  Pt will improve Rt shoulder AROM flexion and abduction to at least 145 to match the opposite side     Status  Partially Met      PT LONG TERM GOAL #3   Title  Pt will decrease DQASH to 35% or less disability     Status  On-going      PT LONG TERM GOAL #4   Title  Pt will be educated on how to stretch and exercise throughout chemotherapy and radiation if unable to tolerate treatment during chemotherapy     Status  On-going            Plan - 08/11/18 1721    Clinical Impression Statement  Pt presents 1 day after first chemo treatment feeling well.  Has greatly improved her Rt shoulder ROM by almost 15 degrees into abduction and flexion.  Pt very anxious about lymphedema so a majority of the session was education about this and compression.  Pt will return in 1 week to continue Rt shoulder ROM for radiation    PT Frequency  1x / week    PT Duration  8 weeks    PT Treatment/Interventions  ADLs/Self Care Home Management;Therapeutic exercise;Therapeutic activities;Patient/family education;Manual techniques;Manual lymph drainage;Passive range of motion;Scar mobilization    PT Next Visit Plan  Lt shoulder AAROM/PROM, supine scap series    PT Home Exercise  Plan  given post op exercises 07/28/18, standing dowel on 08/11/18       Patient will benefit from skilled therapeutic intervention in order to improve the following deficits and impairments:  Decreased activity tolerance, Decreased strength,  Impaired UE functional use, Pain, Decreased knowledge of precautions, Decreased range of motion  Visit Diagnosis: Malignant neoplasm of upper-outer quadrant of right breast in female, estrogen receptor positive (HCC)  Stiffness of right shoulder, not elsewhere classified  Acute pain of right shoulder  At risk for lymphedema     Problem List Patient Active Problem List   Diagnosis Date Noted  . Breast cancer, right ([place]) 06/21/2018  . Genetic testing 01/14/2018  . Family history of colon cancer   . Family history of breast cancer   . Malignant neoplasm of upper-outer quadrant of right breast in female, estrogen receptor positive ([place]) 12/15/2017    Shan Levans, PT 08/11/2018, 5:24 PM  [place], [place], [address] Phone: [phone]   Fax:  [phone]  Name: Charlene Perry MRN: [id number] Date of Birth: Apr 20, 1951

## 2018-08-11 NOTE — Patient Instructions (Addendum)
Flexion (Eccentric) - Active-Assist (Cane)          Cancer Rehab [phone]    Use unaffected arm to push affected arm forward. Avoid hiking shoulder (shoulder should NOT touch cheek). Keep palm relaxed. Slowly lower affected arm. Hold stretch for _5_ seconds repeating _5-10_ times, _1-2_ times a day.  Abduction (Eccentric) - Active-Assist (Cane)    Use unaffected arm to push affected arm out to side. Avoid hiking shoulder (shoulder should NOT touch cheek). Keep palm relaxed. Slowly lower affected arm. Hold stretch _5_ seconds repeating _5-10_ times, _1-2_ times a day.  Cane Exercise: Extension   Stand holding cane behind back with both hands palm-up. Lift the cane away from body until gentle stretch felt. Do NOT lean forward.  Hold __5__ seconds. Repeat _5-10___ times. Do _1-2___ sessions per day.  CHEST: Doorway, Bilateral - Standing    Standing in doorway, place hands on wall with elbows bent at shoulder height and place one foot in front of other. Shift weight onto front foot. Hold _10-20__ seconds. Do _3-5_ times, _1-2_ times a day.     Get prescription signed for compression and you may go to [facility] or get measured here by [facility]

## 2018-08-12 ENCOUNTER — Inpatient Hospital Stay: Payer: BLUE CROSS/BLUE SHIELD

## 2018-08-12 VITALS — BP 167/75 | HR 95 | Temp 97.9°F | Resp 18

## 2018-08-12 DIAGNOSIS — C50411 Malignant neoplasm of upper-outer quadrant of right female breast: Secondary | ICD-10-CM

## 2018-08-12 DIAGNOSIS — Z5189 Encounter for other specified aftercare: Secondary | ICD-10-CM | POA: Diagnosis not present

## 2018-08-12 DIAGNOSIS — Z170 Estrogen receptor positive status [ER+]: Principal | ICD-10-CM

## 2018-08-12 MED ORDER — PEGFILGRASTIM-CBQV 6 MG/0.6ML ~~LOC~~ SOSY
PREFILLED_SYRINGE | SUBCUTANEOUS | Status: AC
  Filled 2018-08-12: qty 0.6

## 2018-08-12 MED ORDER — PEGFILGRASTIM-CBQV 6 MG/0.6ML ~~LOC~~ SOSY
6.0000 mg | PREFILLED_SYRINGE | Freq: Once | SUBCUTANEOUS | Status: AC
  Administered 2018-08-12: 6 mg via SUBCUTANEOUS

## 2018-08-12 NOTE — Progress Notes (Signed)
Patient Care Team: [name], MD as PCP - General (Family Medicine)  DIAGNOSIS:    ICD-10-CM   1. Malignant neoplasm of upper-outer quadrant of right breast in female, estrogen receptor positive ([place]) C50.411    Z17.0     SUMMARY OF ONCOLOGIC HISTORY:   Malignant neoplasm of upper-outer quadrant of right breast in female, estrogen receptor positive ([place])   11/12/2017 Initial Diagnosis    Palpable lesion in the right breast upper outer quadrant mammogram and ultrasound revealed 1.4 cm tumor ultrasound-guided biopsy revealed grade 2 IDC ER 3+, PR 3+, HER-2 negative with a Ki-67 of 10%.  Right axillary lymph node biopsy positive, T1cN1 stage I a clinical stage    12/23/2017 Cancer Staging    Staging form: Breast, AJCC 8th Edition - Clinical: Stage IB (cT1c, cN1, cM0, G2, ER+, PR+, HER2-) - Signed by Gardenia Phlegm, NP on 12/23/2017    01/10/2018 Breast MRI    Biopsy-proven malignancy UOQ right breast measuring 6 cm.  Anterior inferior to this is a 1 cm mass which needs biopsy.  Focal non-mass enhancement LOQ retroareolar right breast, left breast 7 mm irregular enhancement, 2 suspicious right axillary lymph nodes    01/27/2018 Procedure    Right breast biopsy UOQ: Grade 1 ILC, ALH Right breast biopsy 6:00: LCIS Left breast biopsy: Benign    06/21/2018 Surgery    Rt Mastectomy: 7 cm fibrotic mass ILC Grade 2, 5/6 LN Positive, ER 90-100%, PR 5%-70%, Ki 67 2-10%, Her 2 Neg, T3N2    07/13/2018 Surgery    Right axillary lymph node dissection: 3/10 lymph nodes positive making total number of lymph nodes 8/16    08/10/2018 -  Chemotherapy    Adjuvant chemotherapy with dose dense Adriamycin and Cytoxan x4 followed by Taxol weekly x12      CHIEF COMPLIANT: Cycle 1 Day 8 Adriamycin and Cytoxan  INTERVAL HISTORY: Charlene Perry is a 68 y.o. with above-mentioned history of right breast cancer who underwent a right mastectomy and is currently on adjuvant  chemotherapy with dose dense Adriamycin and Cytoxan. She presents to the clinic today with her husband.  She reports a facial rash the day after treatment, in addition to fatigue, diarrhea on the third day following treatment, nausea, severe heartburn and indigestion, and a headache this morning. She had difficulty sleeping on Saturday and knee pain that resolved with Claritin. She reports normal taste and appetite. She denies vomiting. Her labs from today show: WBC 4.4, Hg 9.9, platelets 147. She will meet with a urologist on Thursday. She was scheduled to return to work on Monday, but is having issues with her disability and FMLA paperwork, and is concerned about stress if she returns. She requested a wig prescription and compression sleeve. She reviewed her medication list with me.   REVIEW OF SYSTEMS:   Constitutional: Denies fevers, chills or abnormal weight loss (+) fatigue (+) headache Eyes: Denies blurriness of vision Ears, nose, mouth, throat, and face: Denies mucositis or sore throat Respiratory: Denies cough, dyspnea or wheezes Cardiovascular: Denies palpitation, chest discomfort Gastrointestinal: (+) heartburn (+) dyspepsia (+) diarrhea (+) nausea Skin: (+) facial rash MSK: (+) knee pain Lymphatics: Denies new lymphadenopathy or easy bruising Neurological: Denies numbness, tingling or new weaknesses Behavioral/Psych: Mood is stable, no new changes (+) difficulty sleeping Extremities: No lower extremity edema Breast: denies any pain or lumps or nodules in either breasts All other systems were reviewed with the patient and are negative.  I have reviewed the past  medical history, past surgical history, social history and family history with the patient and they are unchanged from previous note.  ALLERGIES:  has No Known Allergies.  MEDICATIONS:  Current Outpatient Medications  Medication Sig Dispense Refill  . aspirin EC 81 MG tablet Take 81 mg by mouth daily.    . celecoxib  (CELEBREX) 200 MG capsule Take 200 mg by mouth daily.    . cholecalciferol (VITAMIN D3) 25 MCG (1000 UT) tablet Take 1 tablet (1,000 Units total) by mouth daily.    . furosemide (LASIX) 40 MG tablet Take 80 mg by mouth daily.    Marland Kitchen gabapentin (NEURONTIN) 400 MG capsule Take 400 mg by mouth 2 (two) times daily.    Marland Kitchen levothyroxine (SYNTHROID, LEVOTHROID) 125 MCG tablet Take 125 mcg by mouth daily before breakfast.    . lidocaine-prilocaine (EMLA) cream Apply to affected area once 30 g 3  . LORazepam (ATIVAN) 0.5 MG tablet Take 1 tablet (0.5 mg total) by mouth at bedtime as needed (Nausea or vomiting). 30 tablet 0  . metFORMIN (GLUCOPHAGE) 1000 MG tablet Take 1,000 mg by mouth 2 (two) times daily with a meal.    . ondansetron (ZOFRAN) 8 MG tablet Take 1 tablet (8 mg total) by mouth 2 (two) times daily as needed. Start on the third day after chemotherapy. 30 tablet 1  . pantoprazole (PROTONIX) 40 MG tablet Take 1 tablet (40 mg total) by mouth daily. 14 tablet 0  . potassium chloride (K-DUR,KLOR-CON) 10 MEQ tablet Take 20 mEq by mouth daily.     . prochlorperazine (COMPAZINE) 10 MG tablet Take 1 tablet (10 mg total) by mouth every 6 (six) hours as needed (Nausea or vomiting). 30 tablet 1  . rosuvastatin (CRESTOR) 40 MG tablet Take 40 mg by mouth at bedtime.      No current facility-administered medications for this visit.    Facility-Administered Medications Ordered in Other Visits  Medication Dose Route Frequency Provider Last Rate Last Dose  . sodium chloride flush (NS) 0.9 % injection 10 mL  10 mL Intravenous PRN [name], MD   10 mL at 08/17/18 1001    PHYSICAL EXAMINATION: ECOG PERFORMANCE STATUS: 1 - Symptomatic but completely ambulatory  Vitals:   08/17/18 1019  BP: (!) 148/74  Pulse: 100  Resp: 17  Temp: 98.4 F (36.9 C)  SpO2: 96%   Filed Weights   08/17/18 1019  Weight: 218 lb 8 oz (99.1 kg)    GENERAL: alert, no distress and comfortable SKIN: skin color, texture,  turgor are normal, no rashes or significant lesions EYES: normal, Conjunctiva are pink and non-injected, sclera clear OROPHARYNX: no exudate, no erythema and lips, buccal mucosa, and tongue normal  NECK: supple, thyroid normal size, non-tender, without nodularity LYMPH: no palpable lymphadenopathy in the cervical, axillary or inguinal LUNGS: clear to auscultation and percussion with normal breathing effort HEART: regular rate & rhythm and no murmurs and no lower extremity edema ABDOMEN: abdomen soft, non-tender and normal bowel sounds MUSCULOSKELETAL: no cyanosis of digits and no clubbing  NEURO: alert & oriented x 3 with fluent speech, no focal motor/sensory deficits EXTREMITIES: No lower extremity edema  LABORATORY DATA:  I have reviewed the data as listed CMP Latest Ref Rng & Units 08/10/2018 07/29/2018 07/14/2018  Glucose 70 - 99 mg/dL 140(H) 117(H) 257(H)  BUN 8 - 23 mg/dL '15 18 14  '$ Creatinine 0.44 - 1.00 mg/dL 0.90 0.95 1.01(H)  Sodium 135 - 145 mmol/L 145 141 138  Potassium 3.5 -  5.1 mmol/L 4.0 3.5 3.8  Chloride 98 - 111 mmol/L 106 103 102  CO2 22 - 32 mmol/L '28 25 25  '$ Calcium 8.9 - 10.3 mg/dL 9.1 9.2 8.7(L)  Total Protein 6.5 - 8.1 g/dL 6.9 - -  Total Bilirubin 0.3 - 1.2 mg/dL 0.3 - -  Alkaline Phos 38 - 126 U/L 81 - -  AST 15 - 41 U/L 18 - -  ALT 0 - 44 U/L 17 - -    Lab Results  Component Value Date   WBC 4.4 08/17/2018   HGB 9.9 (L) 08/17/2018   HCT 31.1 (L) 08/17/2018   MCV 84.3 08/17/2018   PLT 147 (L) 08/17/2018   NEUTROABS PENDING 08/17/2018    ASSESSMENT & PLAN:  Malignant neoplasm of upper-outer quadrant of right breast in female, estrogen receptor positive ([place]) 06/21/17:Rt Mastectomy: 7 cm fibrotic mass ILC Grade 2, 5/6 LN Positive, ER 90-100%, PR 5%-70%, Ki 67 2-10%, Her 2 Neg, T3N2  Pathology counseling: I discussed the final pathology report of the patient provided a copy of this report. I discussed the margins as well as lymph node surgeries. We also  discussed the final staging along with previously performed ER/PR and HER-2/neu testing. 07/13/2018: Axillary lymph node dissection right: 3/10 lymph nodes positive making a total of 8/16 lymph nodes positive  Recommendation: 1. Adj chemo 2. Adj RT 3. Adj Anti estrogen therapy  CT CAP: 07/02/2018: Subtle hypoattenuating lesion upper pole left kidney could be a cyst, urology consulted, left adrenal adenoma. --------------------------------------------------------------------------------------------------------------------- Treatment plan: Cycle 1 day 8 dose dense Adriamycin and Cytoxan Labs reviewed Echocardiogram: 07/02/2018: EF 60 to 65%  Chemo toxicities:  1.  Diarrhea for 1 day 2.  Mild nausea 3.  Mild fatigue 4.  Rash in the face that lasted for 1 day  Return to clinic in 1 week for cycle 2  No orders of the defined types were placed in this encounter.  The patient has a good understanding of the overall plan. she agrees with it. she will call with any problems that may develop before the next visit here.  [name], MD 08/17/2018  Charlene Perry am acting as scribe for Dr. [name].  I have reviewed the above documentation for accuracy and completeness, and I agree with the above.

## 2018-08-17 ENCOUNTER — Inpatient Hospital Stay: Payer: BLUE CROSS/BLUE SHIELD

## 2018-08-17 ENCOUNTER — Inpatient Hospital Stay: Payer: BLUE CROSS/BLUE SHIELD | Attending: Hematology and Oncology | Admitting: Hematology and Oncology

## 2018-08-17 ENCOUNTER — Ambulatory Visit: Payer: BLUE CROSS/BLUE SHIELD

## 2018-08-17 DIAGNOSIS — M545 Low back pain: Secondary | ICD-10-CM | POA: Diagnosis not present

## 2018-08-17 DIAGNOSIS — R630 Anorexia: Secondary | ICD-10-CM | POA: Diagnosis not present

## 2018-08-17 DIAGNOSIS — Z5189 Encounter for other specified aftercare: Secondary | ICD-10-CM | POA: Insufficient documentation

## 2018-08-17 DIAGNOSIS — Z7984 Long term (current) use of oral hypoglycemic drugs: Secondary | ICD-10-CM | POA: Insufficient documentation

## 2018-08-17 DIAGNOSIS — L659 Nonscarring hair loss, unspecified: Secondary | ICD-10-CM | POA: Insufficient documentation

## 2018-08-17 DIAGNOSIS — Z79899 Other long term (current) drug therapy: Secondary | ICD-10-CM | POA: Diagnosis not present

## 2018-08-17 DIAGNOSIS — Z800 Family history of malignant neoplasm of digestive organs: Secondary | ICD-10-CM | POA: Diagnosis not present

## 2018-08-17 DIAGNOSIS — Z7982 Long term (current) use of aspirin: Secondary | ICD-10-CM | POA: Insufficient documentation

## 2018-08-17 DIAGNOSIS — M25569 Pain in unspecified knee: Secondary | ICD-10-CM | POA: Insufficient documentation

## 2018-08-17 DIAGNOSIS — C773 Secondary and unspecified malignant neoplasm of axilla and upper limb lymph nodes: Secondary | ICD-10-CM | POA: Insufficient documentation

## 2018-08-17 DIAGNOSIS — C50411 Malignant neoplasm of upper-outer quadrant of right female breast: Secondary | ICD-10-CM | POA: Diagnosis present

## 2018-08-17 DIAGNOSIS — R110 Nausea: Secondary | ICD-10-CM | POA: Insufficient documentation

## 2018-08-17 DIAGNOSIS — R51 Headache: Secondary | ICD-10-CM | POA: Diagnosis not present

## 2018-08-17 DIAGNOSIS — Z803 Family history of malignant neoplasm of breast: Secondary | ICD-10-CM | POA: Diagnosis not present

## 2018-08-17 DIAGNOSIS — R12 Heartburn: Secondary | ICD-10-CM | POA: Insufficient documentation

## 2018-08-17 DIAGNOSIS — E119 Type 2 diabetes mellitus without complications: Secondary | ICD-10-CM | POA: Diagnosis not present

## 2018-08-17 DIAGNOSIS — Z791 Long term (current) use of non-steroidal anti-inflammatories (NSAID): Secondary | ICD-10-CM | POA: Diagnosis not present

## 2018-08-17 DIAGNOSIS — R5383 Other fatigue: Secondary | ICD-10-CM

## 2018-08-17 DIAGNOSIS — Z5111 Encounter for antineoplastic chemotherapy: Secondary | ICD-10-CM | POA: Insufficient documentation

## 2018-08-17 DIAGNOSIS — Z8249 Family history of ischemic heart disease and other diseases of the circulatory system: Secondary | ICD-10-CM | POA: Insufficient documentation

## 2018-08-17 DIAGNOSIS — Z95828 Presence of other vascular implants and grafts: Secondary | ICD-10-CM

## 2018-08-17 DIAGNOSIS — R197 Diarrhea, unspecified: Secondary | ICD-10-CM | POA: Insufficient documentation

## 2018-08-17 DIAGNOSIS — Z170 Estrogen receptor positive status [ER+]: Secondary | ICD-10-CM | POA: Insufficient documentation

## 2018-08-17 LAB — CMP (CANCER CENTER ONLY)
ALT: 10 U/L (ref 0–44)
AST: 10 U/L — ABNORMAL LOW (ref 15–41)
Albumin: 3.4 g/dL — ABNORMAL LOW (ref 3.5–5.0)
Alkaline Phosphatase: 92 U/L (ref 38–126)
Anion gap: 10 (ref 5–15)
BILIRUBIN TOTAL: 0.3 mg/dL (ref 0.3–1.2)
BUN: 11 mg/dL (ref 8–23)
CO2: 26 mmol/L (ref 22–32)
CREATININE: 0.91 mg/dL (ref 0.44–1.00)
Calcium: 8.7 mg/dL — ABNORMAL LOW (ref 8.9–10.3)
Chloride: 104 mmol/L (ref 98–111)
GFR, Est AFR Am: >60 mL/min (ref >60)
GFR, Estimated: >60 mL/min (ref >60)
Glucose, Bld: 258 mg/dL — ABNORMAL HIGH (ref 70–99)
Potassium: 3.6 mmol/L (ref 3.5–5.1)
Sodium: 140 mmol/L (ref 135–145)
Total Protein: 6.3 g/dL — ABNORMAL LOW (ref 6.5–8.1)

## 2018-08-17 LAB — CBC WITH DIFFERENTIAL (CANCER CENTER ONLY)
Abs Immature Granulocytes: 0.06 10*3/uL (ref 0.00–0.07)
Basophils Absolute: 0.1 10*3/uL (ref 0.0–0.1)
Basophils Relative: 2 %
EOS PCT: 3 %
Eosinophils Absolute: 0.1 10*3/uL (ref 0.0–0.5)
HEMATOCRIT: 31.1 % — AB (ref 36.0–46.0)
Hemoglobin: 9.9 g/dL — ABNORMAL LOW (ref 12.0–15.0)
Immature Granulocytes: 1 %
Lymphocytes Relative: 17 %
Lymphs Abs: 0.8 10*3/uL (ref 0.7–4.0)
MCH: 26.8 pg (ref 26.0–34.0)
MCHC: 31.8 g/dL (ref 30.0–36.0)
MCV: 84.3 fL (ref 80.0–100.0)
Monocytes Absolute: 0.3 10*3/uL (ref 0.1–1.0)
Monocytes Relative: 6 %
Neutro Abs: 3.1 10*3/uL (ref 1.7–7.7)
Neutrophils Relative %: 71 %
Platelet Count: 147 10*3/uL — ABNORMAL LOW (ref 150–400)
RBC: 3.69 MIL/uL — ABNORMAL LOW (ref 3.87–5.11)
RDW: 14.4 % (ref 11.5–15.5)
WBC Count: 4.4 10*3/uL (ref 4.0–10.5)
nRBC: 0 % (ref 0.0–0.2)

## 2018-08-17 MED ORDER — VITAMIN D 25 MCG (1000 UNIT) PO TABS: 1000.0000 [IU] | ORAL_TABLET | Freq: Every day | ORAL | Status: DC

## 2018-08-17 MED ORDER — PANTOPRAZOLE SODIUM 40 MG PO TBEC: 40.0000 mg | DELAYED_RELEASE_TABLET | Freq: Every day | ORAL | 0 refills | Status: DC

## 2018-08-17 MED ORDER — HEPARIN SOD (PORK) LOCK FLUSH 100 UNIT/ML IV SOLN
500.0000 [IU] | Freq: Once | INTRAVENOUS | Status: AC
  Administered 2018-08-17: 500 [IU] via INTRAVENOUS
  Filled 2018-08-17: qty 5

## 2018-08-17 MED ORDER — SODIUM CHLORIDE 0.9% FLUSH
10.0000 mL | INTRAVENOUS | Status: DC | PRN
  Administered 2018-08-17: 10 mL via INTRAVENOUS
  Filled 2018-08-17: qty 10

## 2018-08-17 NOTE — Assessment & Plan Note (Signed)
06/21/17:Rt Mastectomy: 7 cm fibrotic mass ILC Grade 2, 5/6 LN Positive, ER 90-100%, PR 5%-70%, Ki 67 2-10%, Her 2 Neg, T3N2  Pathology counseling: I discussed the final pathology report of the patient provided a copy of this report. I discussed the margins as well as lymph node surgeries. We also discussed the final staging along with previously performed ER/PR and HER-2/neu testing. 07/13/2018: Axillary lymph node dissection right: 3/10 lymph nodes positive making a total of 8/16 lymph nodes positive  Recommendation: 1. Adj chemo 2. Adj RT 3. Adj Anti estrogen therapy  CT CAP: 07/02/2018: Subtle hypoattenuating lesion upper pole left kidney could be a cyst, urology consulted, left adrenal adenoma. --------------------------------------------------------------------------------------------------------------------- Treatment plan: Cycle 1 day 8 dose dense Adriamycin and Cytoxan Labs reviewed Echocardiogram: 07/02/2018: EF 60 to 65%  Chemo toxicities:   Return to clinic in 1 week for cycle 2

## 2018-08-19 ENCOUNTER — Telehealth: Payer: Self-pay

## 2018-08-19 ENCOUNTER — Ambulatory Visit (HOSPITAL_COMMUNITY)
Admission: RE | Admit: 2018-08-19 | Discharge: 2018-08-19 | Disposition: A | Discharge status: Home / Self Care | Payer: BLUE CROSS/BLUE SHIELD | Source: Ambulatory Visit | Attending: Hematology and Oncology | Admitting: Hematology and Oncology

## 2018-08-19 ENCOUNTER — Telehealth: Payer: Self-pay | Admitting: Emergency Medicine

## 2018-08-19 ENCOUNTER — Other Ambulatory Visit: Payer: Self-pay

## 2018-08-19 ENCOUNTER — Encounter: Payer: BLUE CROSS/BLUE SHIELD | Admitting: Medical

## 2018-08-19 DIAGNOSIS — Z170 Estrogen receptor positive status [ER+]: Secondary | ICD-10-CM

## 2018-08-19 DIAGNOSIS — C50411 Malignant neoplasm of upper-outer quadrant of right female breast: Secondary | ICD-10-CM | POA: Insufficient documentation

## 2018-08-19 DIAGNOSIS — Z95828 Presence of other vascular implants and grafts: Secondary | ICD-10-CM | POA: Insufficient documentation

## 2018-08-19 NOTE — Telephone Encounter (Signed)
Pt calling to report that she has worsening port site pain and swelling x2 days. Pt denies laying on side of port and she has been unable to sleep due to pain. Pt came in 2 days ago for labs and port flush. She has since been experiencing pain and tenderness at and around her port site. She denies fevers, redness or bruising at this time. Pt coming back next week for next chemo infusion Discussed with Dr.Gudena. Per MD, obtain cxr to determine port placement, and to have pt see [facility] for evaluation. Will notify [facility] staff of pt complaints.

## 2018-08-19 NOTE — Progress Notes (Signed)
Additional information for FMLA successfully faxed to [place] at [phone].

## 2018-08-19 NOTE — Telephone Encounter (Signed)
Spoke with RN May at Dr. Geralyn Flash desk, pt does not need to see [facility] today to discuss results of CXR as they are stable and do not require evaluation per MD Gudena/RN May.  May will contact pt regarding results.  Cancelling [facility] appt for 08/19/2018.

## 2018-08-19 NOTE — Telephone Encounter (Signed)
Pt cxr port verification came back within normal limits. Pt states that her port pain has subsided right now, but there is mild tenderness at this time. Per MD, okay to take OTC pain medication and ice packs to help with mild swelling. If port pain/discomfort not improved by tomorrow, pt may come and see symptom management. Pt advised to monitor for redness, fever, increased tenderness with pain medication. Pt verbalized understanding .

## 2018-08-20 NOTE — Progress Notes (Signed)
Patient Care Team: [name], MD as PCP - General (Family Medicine)  DIAGNOSIS:    ICD-10-CM   1. Malignant neoplasm of upper-outer quadrant of right breast in female, estrogen receptor positive ([place]) C50.411    Z17.0     SUMMARY OF ONCOLOGIC HISTORY:   Malignant neoplasm of upper-outer quadrant of right breast in female, estrogen receptor positive ([place])   11/12/2017 Initial Diagnosis    Palpable lesion in the right breast upper outer quadrant mammogram and ultrasound revealed 1.4 cm tumor ultrasound-guided biopsy revealed grade 2 IDC ER 3+, PR 3+, HER-2 negative with a Ki-67 of 10%.  Right axillary lymph node biopsy positive, T1cN1 stage I a clinical stage    12/23/2017 Cancer Staging    Staging form: Breast, AJCC 8th Edition - Clinical: Stage IB (cT1c, cN1, cM0, G2, ER+, PR+, HER2-) - Signed by Gardenia Phlegm, NP on 12/23/2017    01/10/2018 Breast MRI    Biopsy-proven malignancy UOQ right breast measuring 6 cm.  Anterior inferior to this is a 1 cm mass which needs biopsy.  Focal non-mass enhancement LOQ retroareolar right breast, left breast 7 mm irregular enhancement, 2 suspicious right axillary lymph nodes    01/27/2018 Procedure    Right breast biopsy UOQ: Grade 1 ILC, ALH Right breast biopsy 6:00: LCIS Left breast biopsy: Benign    06/21/2018 Surgery    Rt Mastectomy: 7 cm fibrotic mass ILC Grade 2, 5/6 LN Positive, ER 90-100%, PR 5%-70%, Ki 67 2-10%, Her 2 Neg, T3N2    07/13/2018 Surgery    Right axillary lymph node dissection: 3/10 lymph nodes positive making total number of lymph nodes 8/16    08/10/2018 -  Chemotherapy    Adjuvant chemotherapy with dose dense Adriamycin and Cytoxan x4 followed by Taxol weekly x12     CHIEF COMPLIANT: Cycle 2 Adriamycin and Cytoxan  INTERVAL HISTORY: Charlene Perry is a 68 y.o. with above-mentioned history of right breast cancer who underwent a right mastectomy and is currently on adjuvant chemotherapy with  dose dense Adriamycin and Cytoxan. She presents to the clinic today with her husband. She had issues with pain at her port and bruising, but reports the pain has resolved and a chest x-ray from 08/19/18 was normal. She notes severe fatigue and bone pain in her lower back and left leg on days 8-9 following treatment that improved with movement and diarrhea on Friday. She notes her hair has begun to fall out. She notes decreased appetite but has been trying to eat more protein, and denies loss of taste. Her labs from today show: WBC 13.0, Hg 10.9, platelets 131. She questioned when she should go back to work and requested approval for intermittent FMLA.  REVIEW OF SYSTEMS:   Constitutional: Denies fevers, chills or abnormal weight loss (+) fatigue (+) hair loss (+) loss of appetite Eyes: Denies blurriness of vision Ears, nose, mouth, throat, and face: Denies mucositis or sore throat Respiratory: Denies cough, dyspnea or wheezes Cardiovascular: Denies palpitation, chest discomfort Gastrointestinal: Denies nausea, heartburn (+) diarrhea Skin: Denies abnormal skin rashes MSK: (+) bone pain in left leg and lower back Lymphatics: Denies new lymphadenopathy or easy bruising Neurological: Denies numbness, tingling or new weaknesses Behavioral/Psych: Mood is stable, no new changes  Extremities: No lower extremity edema Breast: denies any lumps or nodules in either breasts (+) pain at port site All other systems were reviewed with the patient and are negative.  I have reviewed the past medical history, past surgical history,  social history and family history with the patient and they are unchanged from previous note.  ALLERGIES:  has No Known Allergies.  MEDICATIONS:  Current Outpatient Medications  Medication Sig Dispense Refill  . aspirin EC 81 MG tablet Take 81 mg by mouth daily.    . celecoxib (CELEBREX) 200 MG capsule Take 200 mg by mouth daily.    . cholecalciferol (VITAMIN D3) 25 MCG (1000 UT)  tablet Take 1 tablet (1,000 Units total) by mouth daily.    . furosemide (LASIX) 40 MG tablet Take 80 mg by mouth daily.    Marland Kitchen gabapentin (NEURONTIN) 400 MG capsule Take 400 mg by mouth 2 (two) times daily.    Marland Kitchen levothyroxine (SYNTHROID, LEVOTHROID) 125 MCG tablet Take 125 mcg by mouth daily before breakfast.    . lidocaine-prilocaine (EMLA) cream Apply to affected area once 30 g 3  . LORazepam (ATIVAN) 0.5 MG tablet Take 1 tablet (0.5 mg total) by mouth at bedtime as needed (Nausea or vomiting). 30 tablet 0  . metFORMIN (GLUCOPHAGE) 1000 MG tablet Take 1,000 mg by mouth 2 (two) times daily with a meal.    . ondansetron (ZOFRAN) 8 MG tablet Take 1 tablet (8 mg total) by mouth 2 (two) times daily as needed. Start on the third day after chemotherapy. 30 tablet 1  . pantoprazole (PROTONIX) 40 MG tablet Take 1 tablet (40 mg total) by mouth daily. 14 tablet 0  . potassium chloride (K-DUR,KLOR-CON) 10 MEQ tablet Take 20 mEq by mouth daily.     . prochlorperazine (COMPAZINE) 10 MG tablet Take 1 tablet (10 mg total) by mouth every 6 (six) hours as needed (Nausea or vomiting). 30 tablet 1  . rosuvastatin (CRESTOR) 40 MG tablet Take 40 mg by mouth at bedtime.      No current facility-administered medications for this visit.     PHYSICAL EXAMINATION: ECOG PERFORMANCE STATUS: 1 - Symptomatic but completely ambulatory  Vitals:   08/24/18 1145  BP: 139/74  Pulse: 99  Resp: 18  Temp: 98.8 F (37.1 C)  SpO2: 96%   Filed Weights   08/24/18 1145  Weight: 214 lb 8 oz (97.3 kg)    GENERAL: alert, no distress and comfortable SKIN: skin color, texture, turgor are normal, no rashes or significant lesions EYES: normal, Conjunctiva are pink and non-injected, sclera clear OROPHARYNX: no exudate, no erythema and lips, buccal mucosa, and tongue normal  NECK: supple, thyroid normal size, non-tender, without nodularity LYMPH: no palpable lymphadenopathy in the cervical, axillary or inguinal LUNGS: clear to  auscultation and percussion with normal breathing effort HEART: regular rate & rhythm and no murmurs and no lower extremity edema ABDOMEN: abdomen soft, non-tender and normal bowel sounds MUSCULOSKELETAL: no cyanosis of digits and no clubbing  NEURO: alert & oriented x 3 with fluent speech, no focal motor/sensory deficits EXTREMITIES: No lower extremity edema  LABORATORY DATA:  I have reviewed the data as listed CMP Latest Ref Rng & Units 08/17/2018 08/10/2018 07/29/2018  Glucose 70 - 99 mg/dL 258(H) 140(H) 117(H)  BUN 8 - 23 mg/dL '11 15 18  '$ Creatinine 0.44 - 1.00 mg/dL 0.91 0.90 0.95  Sodium 135 - 145 mmol/L 140 145 141  Potassium 3.5 - 5.1 mmol/L 3.6 4.0 3.5  Chloride 98 - 111 mmol/L 104 106 103  CO2 22 - 32 mmol/L '26 28 25  '$ Calcium 8.9 - 10.3 mg/dL 8.7(L) 9.1 9.2  Total Protein 6.5 - 8.1 g/dL 6.3(L) 6.9 -  Total Bilirubin 0.3 - 1.2 mg/dL 0.3  0.3 -  Alkaline Phos 38 - 126 U/L 92 81 -  AST 15 - 41 U/L 10(L) 18 -  ALT 0 - 44 U/L 10 17 -    Lab Results  Component Value Date   WBC 13.0 (H) 08/24/2018   HGB 10.9 (L) 08/24/2018   HCT 35.5 (L) 08/24/2018   MCV 84.7 08/24/2018   PLT 131 (L) 08/24/2018   NEUTROABS PENDING 08/24/2018    ASSESSMENT & PLAN:  Malignant neoplasm of upper-outer quadrant of right breast in female, estrogen receptor positive ([place]) 06/21/17:Rt Mastectomy: 7 cm fibrotic mass ILC Grade 2, 5/6 LN Positive, ER 90-100%, PR 5%-70%, Ki 67 2-10%, Her 2 Neg, T3N2 Treatment plan: 1. Adj chemo 2. Adj RT 3. Adj Anti estrogen therapy  CT CAP: 07/02/2018: Subtle hypoattenuating lesion upper pole left kidney could be a cyst, urology consulted, left adrenal adenoma. --------------------------------------------------------------------------------------------------------------------- Current treatment: Cycle 2 day 1 dose dense Adriamycin and Cytoxan Labs reviewed Echocardiogram: 07/02/2018: EF 60 to 65%  Chemo toxicities:  1.  Diarrhea for 1 day 2.  Mild nausea 3.   Mild fatigue 4.  Rash in the face that lasted for 1 day  Patient has been released to go back to work starting 09/02/2018 on intermittent FMLA basis so that she can take time off to get chemo and recover but then the rest of the time she could work. She understands that she needs to take precautions at work to prevent infection.  Return to clinic in 2 weeks for cycle  3    No orders of the defined types were placed in this encounter.  The patient has a good understanding of the overall plan. she agrees with it. she will call with any problems that may develop before the next visit here.  [name], MD 08/24/2018  Julious Oka Dorshimer am acting as scribe for Dr. [name].  I have reviewed the above documentation for accuracy and completeness, and I agree with the above.

## 2018-08-24 ENCOUNTER — Inpatient Hospital Stay: Payer: BLUE CROSS/BLUE SHIELD

## 2018-08-24 ENCOUNTER — Inpatient Hospital Stay (HOSPITAL_BASED_OUTPATIENT_CLINIC_OR_DEPARTMENT_OTHER): Payer: BLUE CROSS/BLUE SHIELD | Admitting: Hematology and Oncology

## 2018-08-24 DIAGNOSIS — Z170 Estrogen receptor positive status [ER+]: Secondary | ICD-10-CM

## 2018-08-24 DIAGNOSIS — C773 Secondary and unspecified malignant neoplasm of axilla and upper limb lymph nodes: Secondary | ICD-10-CM

## 2018-08-24 DIAGNOSIS — L659 Nonscarring hair loss, unspecified: Secondary | ICD-10-CM

## 2018-08-24 DIAGNOSIS — M545 Low back pain: Secondary | ICD-10-CM

## 2018-08-24 DIAGNOSIS — E119 Type 2 diabetes mellitus without complications: Secondary | ICD-10-CM

## 2018-08-24 DIAGNOSIS — C50011 Malignant neoplasm of nipple and areola, right female breast: Secondary | ICD-10-CM

## 2018-08-24 DIAGNOSIS — C50411 Malignant neoplasm of upper-outer quadrant of right female breast: Secondary | ICD-10-CM | POA: Diagnosis not present

## 2018-08-24 DIAGNOSIS — Z95828 Presence of other vascular implants and grafts: Secondary | ICD-10-CM

## 2018-08-24 DIAGNOSIS — Z79899 Other long term (current) drug therapy: Secondary | ICD-10-CM

## 2018-08-24 DIAGNOSIS — R630 Anorexia: Secondary | ICD-10-CM

## 2018-08-24 DIAGNOSIS — R5383 Other fatigue: Secondary | ICD-10-CM | POA: Diagnosis not present

## 2018-08-24 DIAGNOSIS — Z791 Long term (current) use of non-steroidal anti-inflammatories (NSAID): Secondary | ICD-10-CM

## 2018-08-24 DIAGNOSIS — Z7982 Long term (current) use of aspirin: Secondary | ICD-10-CM

## 2018-08-24 DIAGNOSIS — Z7984 Long term (current) use of oral hypoglycemic drugs: Secondary | ICD-10-CM

## 2018-08-24 DIAGNOSIS — Z5189 Encounter for other specified aftercare: Secondary | ICD-10-CM | POA: Diagnosis not present

## 2018-08-24 LAB — CBC WITH DIFFERENTIAL (CANCER CENTER ONLY)
ABS IMMATURE GRANULOCYTES: 1.12 10*3/uL — AB (ref 0.00–0.07)
Basophils Absolute: 0.1 10*3/uL (ref 0.0–0.1)
Basophils Relative: 1 %
Eosinophils Absolute: 0.1 10*3/uL (ref 0.0–0.5)
Eosinophils Relative: 1 %
HCT: 35.5 % — ABNORMAL LOW (ref 36.0–46.0)
HEMOGLOBIN: 10.9 g/dL — AB (ref 12.0–15.0)
Immature Granulocytes: 9 %
LYMPHS ABS: 1.2 10*3/uL (ref 0.7–4.0)
Lymphocytes Relative: 9 %
MCH: 26 pg (ref 26.0–34.0)
MCHC: 30.7 g/dL (ref 30.0–36.0)
MCV: 84.7 fL (ref 80.0–100.0)
MONOS PCT: 6 %
Monocytes Absolute: 0.8 10*3/uL (ref 0.1–1.0)
Neutro Abs: 9.7 10*3/uL — ABNORMAL HIGH (ref 1.7–7.7)
Neutrophils Relative %: 74 %
Platelet Count: 131 10*3/uL — ABNORMAL LOW (ref 150–400)
RBC: 4.19 MIL/uL (ref 3.87–5.11)
RDW: 15.1 % (ref 11.5–15.5)
WBC Count: 13 10*3/uL — ABNORMAL HIGH (ref 4.0–10.5)
nRBC: 0.5 % — ABNORMAL HIGH (ref 0.0–0.2)

## 2018-08-24 LAB — CMP (CANCER CENTER ONLY)
ALK PHOS: 107 U/L (ref 38–126)
ALT: 18 U/L (ref 0–44)
AST: 14 U/L — ABNORMAL LOW (ref 15–41)
Albumin: 3.9 g/dL (ref 3.5–5.0)
Anion gap: 14 (ref 5–15)
BUN: 12 mg/dL (ref 8–23)
CO2: 27 mmol/L (ref 22–32)
Calcium: 8.8 mg/dL — ABNORMAL LOW (ref 8.9–10.3)
Chloride: 103 mmol/L (ref 98–111)
Creatinine: 0.99 mg/dL (ref 0.44–1.00)
GFR, Est AFR Am: >60 mL/min (ref >60)
GFR, Estimated: 60 mL/min — ABNORMAL LOW (ref >60)
Glucose, Bld: 173 mg/dL — ABNORMAL HIGH (ref 70–99)
Potassium: 3.7 mmol/L (ref 3.5–5.1)
Sodium: 144 mmol/L (ref 135–145)
Total Bilirubin: 0.3 mg/dL (ref 0.3–1.2)
Total Protein: 7.2 g/dL (ref 6.5–8.1)

## 2018-08-24 MED ORDER — SODIUM CHLORIDE 0.9 % IV SOLN
600.0000 mg/m2 | Freq: Once | INTRAVENOUS | Status: AC
  Administered 2018-08-24: 1300 mg via INTRAVENOUS
  Filled 2018-08-24: qty 65

## 2018-08-24 MED ORDER — DOXORUBICIN HCL CHEMO IV INJECTION 2 MG/ML
60.0000 mg/m2 | Freq: Once | INTRAVENOUS | Status: AC
  Administered 2018-08-24: 130 mg via INTRAVENOUS
  Filled 2018-08-24: qty 65

## 2018-08-24 MED ORDER — SODIUM CHLORIDE 0.9% FLUSH
10.0000 mL | INTRAVENOUS | Status: DC | PRN
  Administered 2018-08-24: 10 mL
  Filled 2018-08-24: qty 10

## 2018-08-24 MED ORDER — PALONOSETRON HCL INJECTION 0.25 MG/5ML
INTRAVENOUS | Status: AC
  Filled 2018-08-24: qty 5

## 2018-08-24 MED ORDER — PALONOSETRON HCL INJECTION 0.25 MG/5ML
0.2500 mg | Freq: Once | INTRAVENOUS | Status: AC
  Administered 2018-08-24: 0.25 mg via INTRAVENOUS

## 2018-08-24 MED ORDER — SODIUM CHLORIDE 0.9 % IV SOLN
Freq: Once | INTRAVENOUS | Status: AC
  Administered 2018-08-24: 14:00:00 via INTRAVENOUS
  Filled 2018-08-24: qty 5

## 2018-08-24 MED ORDER — SODIUM CHLORIDE 0.9% FLUSH
10.0000 mL | Freq: Once | INTRAVENOUS | Status: AC
  Administered 2018-08-24: 10 mL
  Filled 2018-08-24: qty 10

## 2018-08-24 MED ORDER — HEPARIN SOD (PORK) LOCK FLUSH 100 UNIT/ML IV SOLN
500.0000 [IU] | Freq: Once | INTRAVENOUS | Status: AC | PRN
  Administered 2018-08-24: 500 [IU]
  Filled 2018-08-24: qty 5

## 2018-08-24 MED ORDER — SODIUM CHLORIDE 0.9 % IV SOLN
Freq: Once | INTRAVENOUS | Status: AC
  Administered 2018-08-24: 13:00:00 via INTRAVENOUS
  Filled 2018-08-24: qty 250

## 2018-08-24 NOTE — Assessment & Plan Note (Signed)
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 Adj chemo 2. Adj RT 3. Adj Anti estrogen therapy  CT CAP: 07/02/2018: Subtle hypoattenuating lesion upper pole left kidney could be a cyst, urology consulted, left adrenal adenoma. --------------------------------------------------------------------------------------------------------------------- Current treatment: Cycle 2 day 1 dose dense Adriamycin and Cytoxan Labs reviewed Echocardiogram: 07/02/2018: EF 60 to 65%  Chemo toxicities:  1.  Diarrhea for 1 day 2.  Mild nausea 3.  Mild fatigue 4.  Rash in the face that lasted for 1 day  Return to clinic in 2 weeks for cycle 3

## 2018-08-24 NOTE — Patient Instructions (Signed)
[facility] Discharge Instructions for Patients Receiving Chemotherapy  Today you received the following chemotherapy agents Doxorubicin, Cytoxan.  To help prevent nausea and vomiting after your treatment, we encourage you to take your nausea medication. DO NOT TAKE ZOFRAN FOR THREE DAYS AFTER TREATMENT.   If you develop nausea and vomiting that is not controlled by your nausea medication, call the clinic.   BELOW ARE SYMPTOMS THAT SHOULD BE REPORTED IMMEDIATELY:  *FEVER GREATER THAN 100.5 F  *CHILLS WITH OR WITHOUT FEVER  NAUSEA AND VOMITING THAT IS NOT CONTROLLED WITH YOUR NAUSEA MEDICATION  *UNUSUAL SHORTNESS OF BREATH  *UNUSUAL BRUISING OR BLEEDING  TENDERNESS IN MOUTH AND THROAT WITH OR WITHOUT PRESENCE OF ULCERS  *URINARY PROBLEMS  *BOWEL PROBLEMS  UNUSUAL RASH Items with * indicate a potential emergency and should be followed up as soon as possible.  Feel free to call the clinic should you have any questions or concerns. The clinic phone number is [phone].  Please show the [facility] at check-in to the Emergency Department and triage nurse.

## 2018-08-25 ENCOUNTER — Encounter: Payer: Self-pay | Admitting: Rehabilitation

## 2018-08-25 ENCOUNTER — Ambulatory Visit: Payer: BLUE CROSS/BLUE SHIELD | Attending: General Surgery | Admitting: Rehabilitation

## 2018-08-25 ENCOUNTER — Other Ambulatory Visit: Payer: Self-pay

## 2018-08-25 DIAGNOSIS — M25611 Stiffness of right shoulder, not elsewhere classified: Secondary | ICD-10-CM

## 2018-08-25 DIAGNOSIS — Z9189 Other specified personal risk factors, not elsewhere classified: Secondary | ICD-10-CM

## 2018-08-25 DIAGNOSIS — C50411 Malignant neoplasm of upper-outer quadrant of right female breast: Secondary | ICD-10-CM | POA: Diagnosis present

## 2018-08-25 DIAGNOSIS — Z170 Estrogen receptor positive status [ER+]: Secondary | ICD-10-CM | POA: Diagnosis present

## 2018-08-25 DIAGNOSIS — M25511 Pain in right shoulder: Secondary | ICD-10-CM | POA: Diagnosis present

## 2018-08-25 NOTE — Therapy (Signed)
[place], [place], [address] Phone: [phone]   Fax:  [phone]  Physical Therapy Treatment  Patient Details  Name: Charlene Perry MRN: [id number] Date of Birth: 27-Aug-1950 Referring Provider (PT): Dr. Donne Hazel   Encounter Date: 08/25/2018  PT End of Session - 08/25/18 1353    Visit Number  3    Number of Visits  13    Date for PT Re-Evaluation  09/08/18    PT Start Time  1350    PT Stop Time  1422    PT Time Calculation (min)  32 min    Activity Tolerance  Patient tolerated treatment well    Behavior During Therapy  [facility] for tasks assessed/performed       Past Medical History:  Diagnosis Date  . Arthritis   . Breast cancer in female [facility])    Right  . Cancer ([place])   . Diabetes mellitus without complication ([place])   . Family history of breast cancer   . Family history of colon cancer   . Hypertension    takes fluid pills  . Hypothyroidism   . Pink eye disease of left eye    went to eye md and received drops - yesterday.  . Sleep apnea    back in the 1990's  . Vitiligo     Past Surgical History:  Procedure Laterality Date  . AXILLARY LYMPH NODE DISSECTION Right 07/13/2018   Procedure: RIGHT AXILLARY LYMPH NODE DISSECTION;  Surgeon: [name], MD;  Location: [place];  Service: General;  Laterality: Right;  . BREAST SURGERY     left breast fibro adenoma  . CATARACT EXTRACTION Right 2019  . CERVICAL FUSION N/A    C5 &6  . CESAREAN SECTION    . CHOLECYSTECTOMY  2004-2005  . CYSTECTOMY     patient denies  . EYE SURGERY    . IR IMAGING GUIDED PORT INSERTION  07/29/2018  . MASTECTOMY WITH RADIOACTIVE SEED GUIDED EXCISION AND AXILLARY SENTINEL LYMPH NODE BIOPSY Right 06/21/2018  . MASTECTOMY WITH RADIOACTIVE SEED GUIDED EXCISION AND AXILLARY SENTINEL LYMPH NODE BIOPSY Right 06/21/2018   Procedure: RIGHT TOTAL MASTECTOMY WITH RIGHT AXILLARY SENTINEL NODE BIOPSY AND RIGHT SEED  GUIDED NODE EXCISION;  Surgeon: [name], MD;  Location: [place];  Service: General;  Laterality: Right;  . MYOMECTOMY  1986  . PARTIAL HYSTERECTOMY  1995  . PILONIDAL CYST EXCISION    . PORTACATH PLACEMENT N/A 07/13/2018   Procedure: INSERTION PORT-A-CATH WITH ULTRASOUND;  Surgeon: [name], MD;  Location: [place];  Service: General;  Laterality: N/A;  . TONSILLECTOMY  2002  . UVULOPALATOPHARYNGOPLASTY, TONSILLECTOMY AND SEPTOPLASTY      There were no vitals filed for this visit.  Subjective Assessment - 08/25/18 1346    Subjective  I am doing well.  I probably haven't exercised as much as before.  I felt so bad about 7-8 days after chemotherapy.     Pertinent History  S/p Rt mastectomy with SLNB 6/7 positive on 06/21/18 followed by return for ALND 1/10 on 07/13/18 due to ER/PR positive HER2 negative cancer. History of DM, cervical fusion, and partial hysterectomy.  Pt lives in [place], [place].  Chemotherapy delayed due to healing and will now begin 08/10/18. Radiation after chemotherapy. Investigating kidney lesion on the Rt with decreased GFR    Patient Stated Goals  get back to normal     Currently in Pain?  No/denies  [facility] Adult PT Treatment/Exercise - 08/25/18 0001      Exercises   Exercises  Shoulder      Shoulder Exercises: Supine   Flexion  10 reps;Both    Flexion Limitations  dowel rod    Other Supine Exercises  yellow band horizontal abduction, ER, Rt D2 flexion, standing flexion, standing abduction, and extension x 10 each for HEP instruction      Manual Therapy   Passive ROM  to the Rt shoulder to tolerance all directions             PT Education - 08/25/18 1422    Education Details  new HEP    Person(s) Educated  Patient    Methods  Explanation;Demonstration;Tactile cues;Verbal cues;Handout    Comprehension  Verbalized understanding;Returned demonstration;Verbal cues required          PT Long Term Goals  - 08/25/18 1353      PT LONG TERM GOAL #1   Title  Pt will be able to tolerate radiation position on the Rt side (radiation will not start for a few months)    Status  Achieved      PT LONG TERM GOAL #2   Title  Pt will improve Rt shoulder AROM flexion and abduction to at least 145 to match the opposite side     Baseline  flex:142  abd;149  on 08/25/18    Status  Partially Met      PT LONG TERM GOAL #3   Title  Pt will decrease DQASH to 35% or less disability     Status  On-going      PT LONG TERM GOAL #4   Title  Pt will be educated on how to stretch and exercise throughout chemotherapy and radiation if unable to tolerate treatment during chemotherapy     Status  On-going            Plan - 08/25/18 1422    Clinical Impression Statement  Pt presents today still with excellent shoulder ROM.  Updated pt with yellow supine scapular series and standing flexion, abduction, and extension with handout.  Pt is doing very well but would like to continue sessions x 2-3 more just to see how good she can be.      PT Treatment/Interventions  ADLs/Self Care Home Management;Therapeutic exercise;Therapeutic activities;Patient/family education;Manual techniques;Manual lymph drainage;Passive range of motion;Scar mobilization    PT Next Visit Plan  continue Right shoulder mobility, PROM, try pulleys, wall wall exercises    PT Home Exercise Plan  given post op exercises 07/28/18, standing dowel on 08/11/18, supine scapular series Access Code: [email or web address]  on 08/25/18    Consulted and Agree with Plan of Care  Patient       Patient will benefit from skilled therapeutic intervention in order to improve the following deficits and impairments:     Visit Diagnosis: Malignant neoplasm of upper-outer quadrant of right breast in female, estrogen receptor positive ([place])  Stiffness of right shoulder, not elsewhere classified  Acute pain of right shoulder  At risk for lymphedema     Problem List Patient  Active Problem List   Diagnosis Date Noted  . Port-A-Cath in place 08/24/2018  . Breast cancer, right ([place]) 06/21/2018  . Genetic testing 01/14/2018  . Family history of colon cancer   . Family history of breast cancer   . Malignant neoplasm of upper-outer quadrant of right breast in female, estrogen receptor positive ([place]) 12/15/2017    Shan Levans, PT 08/25/2018, 2:28  PM  [place], [place], [address] Phone: [phone]   Fax:  [phone]  Name: Charlene Perry MRN: [id number] Date of Birth: 22-Jun-1950

## 2018-08-25 NOTE — Patient Instructions (Signed)
Access Code: [email or web address]  URL: [email or web address]  Date: 08/25/2018  Prepared by: Shan Levans   Exercises  Supine Shoulder Horizontal Abduction with Resistance - 10 reps - 1-3 sets - 1x daily - 7x weekly  Supine Shoulder External Rotation with Resistance - 10 reps - 1-3 sets - 1x daily - 7x weekly  Supine PNF D2 Flexion with Resistance - 10 reps - 1-3 sets - 1x daily - 7x weekly  Standing Shoulder Flexion with Resistance - 10 reps - 1-3 sets - 1x daily - 7x weekly  Standing Single Arm Shoulder Abduction with Resistance - 10 reps - 1-3 sets - 1x daily - 7x weekly  Shoulder extension with resistance - Neutral - 10 reps - 1-3 sets - 1x daily - 7x weekly

## 2018-08-26 ENCOUNTER — Other Ambulatory Visit: Payer: Self-pay

## 2018-08-26 ENCOUNTER — Inpatient Hospital Stay: Payer: BLUE CROSS/BLUE SHIELD

## 2018-08-26 VITALS — BP 140/68 | HR 99 | Temp 98.3°F | Resp 16

## 2018-08-26 DIAGNOSIS — C50411 Malignant neoplasm of upper-outer quadrant of right female breast: Secondary | ICD-10-CM

## 2018-08-26 DIAGNOSIS — Z170 Estrogen receptor positive status [ER+]: Principal | ICD-10-CM

## 2018-08-26 DIAGNOSIS — Z5189 Encounter for other specified aftercare: Secondary | ICD-10-CM | POA: Diagnosis not present

## 2018-08-26 MED ORDER — PEGFILGRASTIM-CBQV 6 MG/0.6ML ~~LOC~~ SOSY
6.0000 mg | PREFILLED_SYRINGE | Freq: Once | SUBCUTANEOUS | Status: AC
  Administered 2018-08-26: 6 mg via SUBCUTANEOUS

## 2018-08-26 MED ORDER — PEGFILGRASTIM-CBQV 6 MG/0.6ML ~~LOC~~ SOSY
PREFILLED_SYRINGE | SUBCUTANEOUS | Status: AC
  Filled 2018-08-26: qty 0.6

## 2018-08-26 NOTE — Patient Instructions (Signed)
Pegfilgrastim injection  What is this medicine?  PEGFILGRASTIM (PEG fil gra stim) is a long-acting granulocyte colony-stimulating factor that stimulates the growth of neutrophils, a type of white blood cell important in the body's fight against infection. It is used to reduce the incidence of fever and infection in patients with certain types of cancer who are receiving chemotherapy that affects the bone marrow, and to increase survival after being exposed to high doses of radiation.  This medicine may be used for other purposes; ask your health care provider or pharmacist if you have questions.  COMMON BRAND NAME(S): Fulphila, Neulasta, UDENYCA  What should I tell my health care provider before I take this medicine?  They need to know if you have any of these conditions:  -kidney disease  -latex allergy  -ongoing radiation therapy  -sickle cell disease  -skin reactions to acrylic adhesives (On-Body Injector only)  -an unusual or allergic reaction to pegfilgrastim, filgrastim, other medicines, foods, dyes, or preservatives  -pregnant or trying to get pregnant  -breast-feeding  How should I use this medicine?  This medicine is for injection under the skin. If you get this medicine at home, you will be taught how to prepare and give the pre-filled syringe or how to use the On-body Injector. Refer to the patient Instructions for Use for detailed instructions. Use exactly as directed. Tell your healthcare provider immediately if you suspect that the On-body Injector may not have performed as intended or if you suspect the use of the On-body Injector resulted in a missed or partial dose.  It is important that you put your used needles and syringes in a special sharps container. Do not put them in a trash can. If you do not have a sharps container, call your pharmacist or healthcare provider to get one.  Talk to your pediatrician regarding the use of this medicine in children. While this drug may be prescribed for  selected conditions, precautions do apply.  Overdosage: If you think you have taken too much of this medicine contact a poison control center or emergency room at once.  NOTE: This medicine is only for you. Do not share this medicine with others.  What if I miss a dose?  It is important not to miss your dose. Call your doctor or health care professional if you miss your dose. If you miss a dose due to an On-body Injector failure or leakage, a new dose should be administered as soon as possible using a single prefilled syringe for manual use.  What may interact with this medicine?  Interactions have not been studied.  Give your health care provider a list of all the medicines, herbs, non-prescription drugs, or dietary supplements you use. Also tell them if you smoke, drink alcohol, or use illegal drugs. Some items may interact with your medicine.  This list may not describe all possible interactions. Give your health care provider a list of all the medicines, herbs, non-prescription drugs, or dietary supplements you use. Also tell them if you smoke, drink alcohol, or use illegal drugs. Some items may interact with your medicine.  What should I watch for while using this medicine?  You may need blood work done while you are taking this medicine.  If you are going to need a MRI, CT scan, or other procedure, tell your doctor that you are using this medicine (On-Body Injector only).  What side effects may I notice from receiving this medicine?  Side effects that you should report to   your doctor or health care professional as soon as possible:  -allergic reactions like skin rash, itching or hives, swelling of the face, lips, or tongue  -back pain  -dizziness  -fever  -pain, redness, or irritation at site where injected  -pinpoint red spots on the skin  -red or dark-brown urine  -shortness of breath or breathing problems  -stomach or side pain, or pain at the shoulder  -swelling  -tiredness  -trouble passing urine or  change in the amount of urine  Side effects that usually do not require medical attention (report to your doctor or health care professional if they continue or are bothersome):  -bone pain  -muscle pain  This list may not describe all possible side effects. Call your doctor for medical advice about side effects. You may report side effects to FDA at 1-800-FDA-1088.  Where should I keep my medicine?  Keep out of the reach of children.  If you are using this medicine at home, you will be instructed on how to store it. Throw away any unused medicine after the expiration date on the label.  NOTE: This sheet is a summary. It may not cover all possible information. If you have questions about this medicine, talk to your doctor, pharmacist, or health care provider.   2019 Elsevier/Gold Standard (2017-09-07 16:57:08)

## 2018-08-31 ENCOUNTER — Ambulatory Visit: Payer: BLUE CROSS/BLUE SHIELD

## 2018-08-31 ENCOUNTER — Other Ambulatory Visit: Payer: BLUE CROSS/BLUE SHIELD

## 2018-08-31 ENCOUNTER — Ambulatory Visit: Payer: BLUE CROSS/BLUE SHIELD | Admitting: Hematology and Oncology

## 2018-09-01 ENCOUNTER — Ambulatory Visit: Payer: BLUE CROSS/BLUE SHIELD | Admitting: Rehabilitation

## 2018-09-01 ENCOUNTER — Encounter: Payer: Self-pay | Admitting: Rehabilitation

## 2018-09-01 ENCOUNTER — Other Ambulatory Visit: Payer: Self-pay

## 2018-09-01 DIAGNOSIS — M25611 Stiffness of right shoulder, not elsewhere classified: Secondary | ICD-10-CM

## 2018-09-01 DIAGNOSIS — Z170 Estrogen receptor positive status [ER+]: Principal | ICD-10-CM

## 2018-09-01 DIAGNOSIS — Z9189 Other specified personal risk factors, not elsewhere classified: Secondary | ICD-10-CM

## 2018-09-01 DIAGNOSIS — C50411 Malignant neoplasm of upper-outer quadrant of right female breast: Secondary | ICD-10-CM

## 2018-09-01 DIAGNOSIS — M25511 Pain in right shoulder: Secondary | ICD-10-CM

## 2018-09-01 NOTE — Therapy (Signed)
[place], [place], [address] Phone: [phone]   Fax:  [phone]  Physical Therapy Treatment  Patient Details  Name: Charlene Perry MRN: [id number] Date of Birth: 1951-05-09 Referring Provider (PT): Dr. Donne Hazel   Encounter Date: 09/01/2018  PT End of Session - 09/01/18 1356    Visit Number  4    Number of Visits  13    Date for PT Re-Evaluation  09/08/18    PT Start Time  1306   pt arriving late   PT Stop Time  1340    PT Time Calculation (min)  34 min    Activity Tolerance  Patient tolerated treatment well    Behavior During Therapy  [facility] for tasks assessed/performed       Past Medical History:  Diagnosis Date  . Arthritis   . Breast cancer in female [facility])    Right  . Cancer ([place])   . Diabetes mellitus without complication ([place])   . Family history of breast cancer   . Family history of colon cancer   . Hypertension    takes fluid pills  . Hypothyroidism   . Pink eye disease of left eye    went to eye md and received drops - yesterday.  . Sleep apnea    back in the 1990's  . Vitiligo     Past Surgical History:  Procedure Laterality Date  . AXILLARY LYMPH NODE DISSECTION Right 07/13/2018   Procedure: RIGHT AXILLARY LYMPH NODE DISSECTION;  Surgeon: [name], MD;  Location: [place];  Service: General;  Laterality: Right;  . BREAST SURGERY     left breast fibro adenoma  . CATARACT EXTRACTION Right 2019  . CERVICAL FUSION N/A    C5 &6  . CESAREAN SECTION    . CHOLECYSTECTOMY  2004-2005  . CYSTECTOMY     patient denies  . EYE SURGERY    . IR IMAGING GUIDED PORT INSERTION  07/29/2018  . MASTECTOMY WITH RADIOACTIVE SEED GUIDED EXCISION AND AXILLARY SENTINEL LYMPH NODE BIOPSY Right 06/21/2018  . MASTECTOMY WITH RADIOACTIVE SEED GUIDED EXCISION AND AXILLARY SENTINEL LYMPH NODE BIOPSY Right 06/21/2018   Procedure: RIGHT TOTAL MASTECTOMY WITH RIGHT AXILLARY SENTINEL NODE  BIOPSY AND RIGHT SEED GUIDED NODE EXCISION;  Surgeon: [name], MD;  Location: [place];  Service: General;  Laterality: Right;  . MYOMECTOMY  1986  . PARTIAL HYSTERECTOMY  1995  . PILONIDAL CYST EXCISION    . PORTACATH PLACEMENT N/A 07/13/2018   Procedure: INSERTION PORT-A-CATH WITH ULTRASOUND;  Surgeon: [name], MD;  Location: [place];  Service: General;  Laterality: N/A;  . TONSILLECTOMY  2002  . UVULOPALATOPHARYNGOPLASTY, TONSILLECTOMY AND SEPTOPLASTY      There were no vitals filed for this visit.  Subjective Assessment - 09/01/18 1305    Subjective  I had chemo last week.  I was in bed most of the day.  I feel a bit better today. 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 History of DM, cervical fusion, and partial hysterectomy.  Pt lives in [place], [place].  Chemotherapy delayed due to healing and will now begin 08/10/18. Radiation after chemotherapy. Investigating kidney lesion on the Rt with decreased GFR    Patient Stated Goals  get back  to normal     Currently in Pain?  No/denies                       [facility] Adult PT Treatment/Exercise - 09/01/18 0001      Exercises   Other Exercises   reviewed final HEP with patient having a good plan      Shoulder Exercises: Supine   Flexion  10 reps;Both    Shoulder Flexion Weight (lbs)  2    ABduction  10 reps    Shoulder ABduction Weight (lbs)  2    Other Supine Exercises  scaption x 10 2#      Shoulder Exercises: Standing   Horizontal ABduction  10 reps;Theraband    Theraband Level (Shoulder Horizontal ABduction)  Level 2 (Red)    Horizontal ABduction Limitations  with foam roller along the spine    External Rotation  Both;10 reps;Theraband    Theraband Level (Shoulder External Rotation)  Level 2 (Red)    Row   20 reps;Theraband    Theraband Level (Shoulder Row)  Level 2 (Red)    Diagonals  Both;10 reps;Theraband    Theraband Level (Shoulder Diagonals)  Level 2 (Red)    Diagonals Limitations  back against the wall              PT Education - 09/01/18 1355    Education Details  final HEP    Person(s) Educated  Patient    Methods  Explanation;Demonstration;Verbal cues    Comprehension  Verbalized understanding          PT Long Term Goals - 09/01/18 1313      PT LONG TERM GOAL #1   Title  Pt will be able to tolerate radiation position on the Rt side (radiation will not start for a few months)    Status  Achieved      PT LONG TERM GOAL #2   Title  Pt will improve Rt shoulder AROM flexion and abduction to at least 145 to match the opposite side     Baseline  flex: 158  abd: 158    Status  Achieved      PT LONG TERM GOAL #3   Title  Pt will decrease DQASH to 35% or less disability     Status  Achieved      PT LONG TERM GOAL #4   Title  Pt will be educated on how to stretch and exercise throughout chemotherapy and radiation if unable to tolerate treatment during chemotherapy     Status  Achieved            Plan - 09/01/18 1647    Clinical Impression Statement  Due to clinic closing due to virus and pt doing well we decided to attempt DC at this visit.  Pt will let clinic know if she has any needs in a few weeks, but PT anticipates D/C.  Excellent ROM and all goals met.     PT Frequency  1x / week    PT Duration  8 weeks    PT Home Exercise Plan  given post op exercises 07/28/18, standing dowel on 08/11/18, supine scapular series Access Code: [email or web address]  on 08/25/18       Patient will benefit from skilled therapeutic intervention in order to improve the following deficits and impairments:     Visit Diagnosis: Malignant neoplasm of upper-outer quadrant of right breast in female, estrogen receptor positive ([place])  Stiffness of right shoulder,  not elsewhere  classified  Acute pain of right shoulder  At risk for lymphedema     Problem List Patient Active Problem List   Diagnosis Date Noted  . Port-A-Cath in place 08/24/2018  . Breast cancer, right ([place]) 06/21/2018  . Genetic testing 01/14/2018  . Family history of colon cancer   . Family history of breast cancer   . Malignant neoplasm of upper-outer quadrant of right breast in female, estrogen receptor positive ([place]) 12/15/2017    Shan Levans, PT 09/01/2018, 4:50 PM  [place], [place], [address] Phone: [phone]   Fax:  [phone]  Name: LASHEBA STEVENS MRN: [id number] Date of Birth: 01-09-1951  PHYSICAL THERAPY DISCHARGE SUMMARY  Visits from Start of Care: 4  Current functional level related to goals / functional outcomes: Pt doing very well.  Still difficulties with chemotherapy fatigue but UEs have met all goals.    Remaining deficits: none   Education / Equipment: HEP Plan: Patient agrees to discharge.  Patient goals were met. Patient is being discharged due to being pleased with the current functional level.  ?????    Shan Levans, PT

## 2018-09-07 ENCOUNTER — Other Ambulatory Visit: Payer: Self-pay

## 2018-09-07 ENCOUNTER — Inpatient Hospital Stay: Payer: BLUE CROSS/BLUE SHIELD

## 2018-09-07 ENCOUNTER — Encounter: Payer: Self-pay | Admitting: Adult Health

## 2018-09-07 ENCOUNTER — Inpatient Hospital Stay (HOSPITAL_BASED_OUTPATIENT_CLINIC_OR_DEPARTMENT_OTHER): Payer: BLUE CROSS/BLUE SHIELD | Admitting: Adult Health

## 2018-09-07 VITALS — BP 148/70 | HR 102 | Temp 99.1°F | Resp 17 | Ht 66.0 in | Wt 217.0 lb

## 2018-09-07 DIAGNOSIS — Z170 Estrogen receptor positive status [ER+]: Secondary | ICD-10-CM

## 2018-09-07 DIAGNOSIS — Z95828 Presence of other vascular implants and grafts: Secondary | ICD-10-CM

## 2018-09-07 DIAGNOSIS — M25569 Pain in unspecified knee: Secondary | ICD-10-CM

## 2018-09-07 DIAGNOSIS — R12 Heartburn: Secondary | ICD-10-CM

## 2018-09-07 DIAGNOSIS — C50411 Malignant neoplasm of upper-outer quadrant of right female breast: Secondary | ICD-10-CM

## 2018-09-07 DIAGNOSIS — Z7984 Long term (current) use of oral hypoglycemic drugs: Secondary | ICD-10-CM

## 2018-09-07 DIAGNOSIS — Z5189 Encounter for other specified aftercare: Secondary | ICD-10-CM | POA: Diagnosis not present

## 2018-09-07 DIAGNOSIS — R5383 Other fatigue: Secondary | ICD-10-CM

## 2018-09-07 DIAGNOSIS — Z791 Long term (current) use of non-steroidal anti-inflammatories (NSAID): Secondary | ICD-10-CM

## 2018-09-07 DIAGNOSIS — M545 Low back pain: Secondary | ICD-10-CM

## 2018-09-07 DIAGNOSIS — Z7982 Long term (current) use of aspirin: Secondary | ICD-10-CM

## 2018-09-07 DIAGNOSIS — E119 Type 2 diabetes mellitus without complications: Secondary | ICD-10-CM

## 2018-09-07 DIAGNOSIS — C773 Secondary and unspecified malignant neoplasm of axilla and upper limb lymph nodes: Secondary | ICD-10-CM

## 2018-09-07 DIAGNOSIS — Z8249 Family history of ischemic heart disease and other diseases of the circulatory system: Secondary | ICD-10-CM

## 2018-09-07 DIAGNOSIS — Z800 Family history of malignant neoplasm of digestive organs: Secondary | ICD-10-CM

## 2018-09-07 DIAGNOSIS — Z79899 Other long term (current) drug therapy: Secondary | ICD-10-CM

## 2018-09-07 DIAGNOSIS — Z803 Family history of malignant neoplasm of breast: Secondary | ICD-10-CM

## 2018-09-07 DIAGNOSIS — L659 Nonscarring hair loss, unspecified: Secondary | ICD-10-CM

## 2018-09-07 DIAGNOSIS — R630 Anorexia: Secondary | ICD-10-CM

## 2018-09-07 DIAGNOSIS — C50011 Malignant neoplasm of nipple and areola, right female breast: Secondary | ICD-10-CM

## 2018-09-07 LAB — CBC WITH DIFFERENTIAL (CANCER CENTER ONLY)
Abs Immature Granulocytes: 1.7 10*3/uL — ABNORMAL HIGH (ref 0.00–0.07)
BASOS PCT: 0 %
Band Neutrophils: 13 %
Basophils Absolute: 0 10*3/uL (ref 0.0–0.1)
Eosinophils Absolute: 0 10*3/uL (ref 0.0–0.5)
Eosinophils Relative: 0 %
HEMATOCRIT: 33.1 % — AB (ref 36.0–46.0)
HEMOGLOBIN: 10.5 g/dL — AB (ref 12.0–15.0)
Lymphocytes Relative: 2 %
Lymphs Abs: 0.4 10*3/uL — ABNORMAL LOW (ref 0.7–4.0)
MCH: 26.6 pg (ref 26.0–34.0)
MCHC: 31.7 g/dL (ref 30.0–36.0)
MCV: 84 fL (ref 80.0–100.0)
Metamyelocytes Relative: 4 %
Monocytes Absolute: 1.1 10*3/uL — ABNORMAL HIGH (ref 0.1–1.0)
Monocytes Relative: 5 %
Myelocytes: 3 %
NEUTROS ABS: 17.9 10*3/uL — AB (ref 1.7–17.7)
Neutrophils Relative %: 72 %
Platelet Count: 141 10*3/uL — ABNORMAL LOW (ref 150–400)
Promyelocytes Relative: 1 %
RBC: 3.94 MIL/uL (ref 3.87–5.11)
RDW: 15.9 % — ABNORMAL HIGH (ref 11.5–15.5)
WBC: 21.1 10*3/uL — AB (ref 4.0–10.5)
nRBC: 0.5 % — ABNORMAL HIGH (ref 0.0–0.2)

## 2018-09-07 LAB — CMP (CANCER CENTER ONLY)
ALT: 17 U/L (ref 0–44)
AST: 15 U/L (ref 15–41)
Albumin: 3.7 g/dL (ref 3.5–5.0)
Alkaline Phosphatase: 123 U/L (ref 38–126)
Anion gap: 14 (ref 5–15)
BUN: 12 mg/dL (ref 8–23)
CO2: 25 mmol/L (ref 22–32)
Calcium: 8.9 mg/dL (ref 8.9–10.3)
Chloride: 104 mmol/L (ref 98–111)
Creatinine: 0.97 mg/dL (ref 0.44–1.00)
GFR, Est AFR Am: >60 mL/min (ref >60)
GFR, Estimated: >60 mL/min (ref >60)
Glucose, Bld: 207 mg/dL — ABNORMAL HIGH (ref 70–99)
POTASSIUM: 4 mmol/L (ref 3.5–5.1)
Sodium: 143 mmol/L (ref 135–145)
Total Bilirubin: 0.3 mg/dL (ref 0.3–1.2)
Total Protein: 6.8 g/dL (ref 6.5–8.1)

## 2018-09-07 MED ORDER — SODIUM CHLORIDE 0.9 % IV SOLN
Freq: Once | INTRAVENOUS | Status: AC
  Administered 2018-09-07: 11:00:00 via INTRAVENOUS
  Filled 2018-09-07: qty 5

## 2018-09-07 MED ORDER — SODIUM CHLORIDE 0.9% FLUSH
10.0000 mL | INTRAVENOUS | Status: DC | PRN
  Administered 2018-09-07: 10 mL
  Filled 2018-09-07: qty 10

## 2018-09-07 MED ORDER — SODIUM CHLORIDE 0.9% FLUSH
10.0000 mL | Freq: Once | INTRAVENOUS | Status: AC
  Administered 2018-09-07: 10 mL
  Filled 2018-09-07: qty 10

## 2018-09-07 MED ORDER — SODIUM CHLORIDE 0.9 % IV SOLN
600.0000 mg/m2 | Freq: Once | INTRAVENOUS | Status: AC
  Administered 2018-09-07: 1300 mg via INTRAVENOUS
  Filled 2018-09-07: qty 65

## 2018-09-07 MED ORDER — SODIUM CHLORIDE 0.9 % IV SOLN
Freq: Once | INTRAVENOUS | Status: AC
  Administered 2018-09-07: 11:00:00 via INTRAVENOUS
  Filled 2018-09-07: qty 250

## 2018-09-07 MED ORDER — PALONOSETRON HCL INJECTION 0.25 MG/5ML
INTRAVENOUS | Status: AC
  Filled 2018-09-07: qty 5

## 2018-09-07 MED ORDER — HEPARIN SOD (PORK) LOCK FLUSH 100 UNIT/ML IV SOLN
500.0000 [IU] | Freq: Once | INTRAVENOUS | Status: AC | PRN
  Administered 2018-09-07: 500 [IU]
  Filled 2018-09-07: qty 5

## 2018-09-07 MED ORDER — PANTOPRAZOLE SODIUM 40 MG PO TBEC: 40.0000 mg | DELAYED_RELEASE_TABLET | Freq: Every day | ORAL | 1 refills | Status: DC

## 2018-09-07 MED ORDER — PALONOSETRON HCL INJECTION 0.25 MG/5ML
0.2500 mg | Freq: Once | INTRAVENOUS | Status: AC
  Administered 2018-09-07: 0.25 mg via INTRAVENOUS

## 2018-09-07 MED ORDER — DOXORUBICIN HCL CHEMO IV INJECTION 2 MG/ML
60.0000 mg/m2 | Freq: Once | INTRAVENOUS | Status: AC
  Administered 2018-09-07: 130 mg via INTRAVENOUS
  Filled 2018-09-07: qty 65

## 2018-09-07 NOTE — Progress Notes (Signed)
[facility] Follow up:    [name], [place] [address]   DIAGNOSIS: Cancer Staging Malignant neoplasm of upper-outer quadrant of right breast in female, estrogen receptor positive ([place]) Staging form: Breast, AJCC 8th Edition - Clinical: Stage IB (cT1c, cN1, cM0, G2, ER+, PR+, HER2-) - Signed by Gardenia Phlegm, NP on 12/23/2017   SUMMARY OF ONCOLOGIC HISTORY:   Malignant neoplasm of upper-outer quadrant of right breast in female, estrogen receptor positive ([place])   11/12/2017 Initial Diagnosis    Palpable lesion in the right breast upper outer quadrant mammogram and ultrasound revealed 1.4 cm tumor ultrasound-guided biopsy revealed grade 2 IDC ER 3+, PR 3+, HER-2 negative with a Ki-67 of 10%.  Right axillary lymph node biopsy positive, T1cN1 stage I a clinical stage    12/23/2017 Cancer Staging    Staging form: Breast, AJCC 8th Edition - Clinical: Stage IB (cT1c, cN1, cM0, G2, ER+, PR+, HER2-) - Signed by Gardenia Phlegm, NP on 12/23/2017    01/10/2018 Breast MRI    Biopsy-proven malignancy UOQ right breast measuring 6 cm.  Anterior inferior to this is a 1 cm mass which needs biopsy.  Focal non-mass enhancement LOQ retroareolar right breast, left breast 7 mm irregular enhancement, 2 suspicious right axillary lymph nodes    01/27/2018 Procedure    Right breast biopsy UOQ: Grade 1 ILC, ALH Right breast biopsy 6:00: LCIS Left breast biopsy: Benign    06/21/2018 Surgery    Rt Mastectomy: 7 cm fibrotic mass ILC Grade 2, 5/6 LN Positive, ER 90-100%, PR 5%-70%, Ki 67 2-10%, Her 2 Neg, T3N2    07/13/2018 Surgery    Right axillary lymph node dissection: 3/10 lymph nodes positive making total number of lymph nodes 8/16    08/10/2018 -  Chemotherapy    Adjuvant chemotherapy with dose dense Adriamycin and Cytoxan x4 followed by Taxol weekly x12     CURRENT THERAPY: cycle 3 day 1 of Adriamycin/Cytoxan with udenyca  support on day 3  INTERVAL HISTORY: AMBIKA Perry 68 y.o. female returns for evaluation prior to receiving chemotherapy.  She has been tolerating treatment well.  She says that her worst problems with pain in her legs.  She takes Tylenol that helps this pain.  She has an increasing number of bowel movements. When she was here the lat time, she was asked to go back to work, since she was on leave.  She was written a note to go back to work.  However, with the increased coronavirus, she cannot go back to work and is staying at home social distancing.  She was taking protonix for 14 days, due to indigestion, however she stopped the medication about 1 week ago.  Her indigestion returned shortly thereafter.  She would like a refill.     Patient Active Problem List   Diagnosis Date Noted  . Port-A-Cath in place 08/24/2018  . Breast cancer, right ([place]) 06/21/2018  . Genetic testing 01/14/2018  . Family history of colon cancer   . Family history of breast cancer   . Malignant neoplasm of upper-outer quadrant of right breast in female, estrogen receptor positive ([facility]) 12/15/2017    has No Known Allergies.  MEDICAL HISTORY: Past Medical History:  Diagnosis Date  . Arthritis   . Breast cancer in female [facility])    Right  . Cancer ([place])   . Diabetes mellitus without complication ([place])   . Family history of breast cancer   .  Family history of colon cancer   . Hypertension    takes fluid pills  . Hypothyroidism   . Pink eye disease of left eye    went to eye md and received drops - yesterday.  . Sleep apnea    back in the 1990's  . Vitiligo     SURGICAL HISTORY: Past Surgical History:  Procedure Laterality Date  . AXILLARY LYMPH NODE DISSECTION Right 07/13/2018   Procedure: RIGHT AXILLARY LYMPH NODE DISSECTION;  Surgeon: [name], MD;  Location: [facility];  Service: General;  Laterality: Right;  . BREAST SURGERY     left breast fibro adenoma  . CATARACT EXTRACTION Right 2019   . CERVICAL FUSION N/A    C5 &6  . CESAREAN SECTION    . CHOLECYSTECTOMY  2004-2005  . CYSTECTOMY     patient denies  . EYE SURGERY    . IR IMAGING GUIDED PORT INSERTION  07/29/2018  . MASTECTOMY WITH RADIOACTIVE SEED GUIDED EXCISION AND AXILLARY SENTINEL LYMPH NODE BIOPSY Right 06/21/2018  . MASTECTOMY WITH RADIOACTIVE SEED GUIDED EXCISION AND AXILLARY SENTINEL LYMPH NODE BIOPSY Right 06/21/2018   Procedure: RIGHT TOTAL MASTECTOMY WITH RIGHT AXILLARY SENTINEL NODE BIOPSY AND RIGHT SEED GUIDED NODE EXCISION;  Surgeon: [name], MD;  Location: [facility];  Service: General;  Laterality: Right;  . MYOMECTOMY  1986  . PARTIAL HYSTERECTOMY  1995  . PILONIDAL CYST EXCISION    . PORTACATH PLACEMENT N/A 07/13/2018   Procedure: INSERTION PORT-A-CATH WITH ULTRASOUND;  Surgeon: [name], MD;  Location: [facility];  Service: General;  Laterality: N/A;  . TONSILLECTOMY  2002  . UVULOPALATOPHARYNGOPLASTY, TONSILLECTOMY AND SEPTOPLASTY      SOCIAL HISTORY: Social History   Socioeconomic History  . Marital status: Married    Spouse name: Not on file  . Number of children: Not on file  . Years of education: Not on file  . Highest education level: Not on file  Occupational History  . Not on file  Social Needs  . Financial resource strain: Not on file  . Food insecurity:    Worry: Not on file    Inability: Not on file  . Transportation needs:    Medical: Not on file    Non-medical: Not on file  Tobacco Use  . Smoking status: Never Smoker  . Smokeless tobacco: Never Used  Substance and Sexual Activity  . Alcohol use: Not Currently  . Drug use: Never  . Sexual activity: Not on file  Lifestyle  . Physical activity:    Days per week: Not on file    Minutes per session: Not on file  . Stress: Not on file  Relationships  . Social connections:    Talks on phone: Not on file    Gets together: Not on file    Attends religious service: Not on file    Active member of club or  organization: Not on file    Attends meetings of clubs or organizations: Not on file    Relationship status: Not on file  . Intimate partner violence:    Fear of current or ex partner: Not on file    Emotionally abused: Not on file    Physically abused: Not on file    Forced sexual activity: Not on file  Other Topics Concern  . Not on file  Social History Narrative  . Not on file    FAMILY HISTORY: Family History  Problem Relation Age of Onset  . Colon cancer Mother 44  .  Colon cancer Sister 6  . Colon cancer Brother        ex early 63's  . Breast cancer Other        40's    Review of Systems  Constitutional: Positive for fatigue. Negative for appetite change, chills and fever.  HENT:   Negative for hearing loss, lump/mass, mouth sores and sore throat.   Eyes: Negative for eye problems and icterus.  Respiratory: Negative for chest tightness, cough and shortness of breath.   Cardiovascular: Negative for chest pain, leg swelling and palpitations.  Gastrointestinal: Negative for abdominal distention, abdominal pain, constipation, diarrhea, nausea and vomiting.  Endocrine: Negative for hot flashes.  Musculoskeletal: Positive for back pain. Negative for arthralgias.  Skin: Negative for itching and rash.  Neurological: Negative for dizziness, extremity weakness, headaches and numbness.  Psychiatric/Behavioral: Negative for depression. The patient is not nervous/anxious.       PHYSICAL EXAMINATION  ECOG PERFORMANCE STATUS: 1 - Symptomatic but completely ambulatory  Vitals:   09/07/18 0931  BP: (!) 148/70  Pulse: (!) 102  Resp: 17  Temp: 99.1 F (37.3 C)  SpO2: 97%    Physical Exam Constitutional:      General: She is not in acute distress.    Appearance: Normal appearance. She is obese. She is not toxic-appearing.  HENT:     Head: Normocephalic and atraumatic.     Nose: No congestion.     Mouth/Throat:     Mouth: Mucous membranes are moist.     Pharynx:  Oropharynx is clear. No oropharyngeal exudate or posterior oropharyngeal erythema.  Eyes:     General: No scleral icterus.    Pupils: Pupils are equal, round, and reactive to light.  Neck:     Musculoskeletal: Normal range of motion and neck supple.  Cardiovascular:     Rate and Rhythm: Normal rate and regular rhythm.     Pulses: Normal pulses.     Heart sounds: Normal heart sounds.  Pulmonary:     Effort: Pulmonary effort is normal.     Breath sounds: Normal breath sounds.  Abdominal:     General: Abdomen is flat. Bowel sounds are normal. There is no distension.     Palpations: Abdomen is soft.     Tenderness: There is no abdominal tenderness.  Musculoskeletal:        General: No swelling.  Lymphadenopathy:     Cervical: No cervical adenopathy.  Skin:    General: Skin is warm and dry.     Capillary Refill: Capillary refill takes less than 2 seconds.     Findings: No rash.  Neurological:     General: No focal deficit present.     Mental Status: She is alert.  Psychiatric:        Mood and Affect: Mood normal.        Behavior: Behavior normal.     LABORATORY DATA:  CBC    Component Value Date/Time   WBC 21.1 (H) 09/07/2018 0850   WBC 6.1 07/29/2018 0836   RBC 3.94 09/07/2018 0850   HGB 10.5 (L) 09/07/2018 0850   HCT 33.1 (L) 09/07/2018 0850   PLT 141 (L) 09/07/2018 0850   MCV 84.0 09/07/2018 0850   MCH 26.6 09/07/2018 0850   MCHC 31.7 09/07/2018 0850   RDW 15.9 (H) 09/07/2018 0850   LYMPHSABS 0.4 (L) 09/07/2018 0850   MONOABS 1.1 (H) 09/07/2018 0850   EOSABS 0.0 09/07/2018 0850   BASOSABS 0.0 09/07/2018 0850    CMP  Component Value Date/Time   NA 143 09/07/2018 0850   K 4.0 09/07/2018 0850   CL 104 09/07/2018 0850   CO2 25 09/07/2018 0850   GLUCOSE 207 (H) 09/07/2018 0850   BUN 12 09/07/2018 0850   CREATININE 0.97 09/07/2018 0850   CALCIUM 8.9 09/07/2018 0850   PROT 6.8 09/07/2018 0850   ALBUMIN 3.7 09/07/2018 0850   AST 15 09/07/2018 0850   ALT  17 09/07/2018 0850   ALKPHOS 123 09/07/2018 0850   BILITOT 0.3 09/07/2018 0850   GFRNONAA >60 09/07/2018 0850   GFRAA >60 09/07/2018 0850        ASSESSMENT and THERAPY PLAN:   Malignant neoplasm of upper-outer quadrant of right breast in female, estrogen receptor positive ([place]) 06/21/17:Rt Mastectomy: 7 cm fibrotic mass ILC Grade 2, 5/6 LN Positive, ER 90-100%, PR 5%-70%, Ki 67 2-10%, Her 2 Neg, T3N2 Treatment plan: 1. Adj chemo 2. Adj RT 3. Adj Anti estrogen therapy  CT CAP: 07/02/2018: Subtle hypoattenuating lesion upper pole left kidney could be a cyst, urology consulted, left adrenal adenoma. --------------------------------------------------------------------------------------------------------------------- Current treatment: Cycle 3 day 1 dose dense Adriamycin and Cytoxan Labs reviewed Echocardiogram: 07/02/2018: EF 60 to 65%  Chemo toxicities:  1.  Diarrhea, minimal 2.  Mild nausea-controlled with anti emetics 3.  Mild fatigue 4.  Rash in the face that lasted for 1 day--resolved 5. Pain in lower back and legs: likely related to [facility], reviewed this with patient in detail  Return to clinic in 2 weeks for cycle  4     All questions were answered. The patient knows to call the clinic with any problems, questions or concerns. We can certainly see the patient much sooner if necessary.  A total of (20) minutes of face-to-face time was spent with this patient with greater than 50% of that time in counseling and care-coordination.  This note was electronically signed. Scot Dock, NP 09/07/2018

## 2018-09-07 NOTE — Patient Instructions (Signed)
[facility] Discharge Instructions for Patients Receiving Chemotherapy  Today you received the following chemotherapy agents: Doxorubicin and Cytoxan.  To help prevent nausea and vomiting after your treatment, we encourage you to take your nausea medication. DO NOT TAKE ZOFRAN FOR THREE DAYS AFTER TREATMENT.   If you develop nausea and vomiting that is not controlled by your nausea medication, call the clinic.   BELOW ARE SYMPTOMS THAT SHOULD BE REPORTED IMMEDIATELY:  *FEVER GREATER THAN 100.5 F  *CHILLS WITH OR WITHOUT FEVER  NAUSEA AND VOMITING THAT IS NOT CONTROLLED WITH YOUR NAUSEA MEDICATION  *UNUSUAL SHORTNESS OF BREATH  *UNUSUAL BRUISING OR BLEEDING  TENDERNESS IN MOUTH AND THROAT WITH OR WITHOUT PRESENCE OF ULCERS  *URINARY PROBLEMS  *BOWEL PROBLEMS  UNUSUAL RASH Items with * indicate a potential emergency and should be followed up as soon as possible.  Feel free to call the clinic should you have any questions or concerns. The clinic phone number is [phone].  Please show the [facility] at check-in to the Emergency Department and triage nurse.

## 2018-09-07 NOTE — Assessment & Plan Note (Addendum)
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 Adj chemo 2. Adj RT 3. Adj Anti estrogen therapy  CT CAP: 07/02/2018: Subtle hypoattenuating lesion upper pole left kidney could be a cyst, urology consulted, left adrenal adenoma. --------------------------------------------------------------------------------------------------------------------- Current treatment: Cycle 3 day 1 dose dense Adriamycin and Cytoxan Labs reviewed Echocardiogram: 07/02/2018: EF 60 to 65%  Chemo toxicities:  1.  Diarrhea, minimal 2.  Mild nausea-controlled with anti emetics 3.  Mild fatigue 4.  Rash in the face that lasted for 1 day--resolved 5. Pain in lower back and legs: likely related to [facility], reviewed this with patient in detail  Return to clinic in 2 weeks for cycle 4

## 2018-09-09 ENCOUNTER — Inpatient Hospital Stay: Payer: BLUE CROSS/BLUE SHIELD

## 2018-09-09 ENCOUNTER — Other Ambulatory Visit: Payer: Self-pay

## 2018-09-09 VITALS — BP 148/67 | HR 99 | Temp 98.5°F | Resp 18

## 2018-09-09 DIAGNOSIS — Z170 Estrogen receptor positive status [ER+]: Principal | ICD-10-CM

## 2018-09-09 DIAGNOSIS — C50411 Malignant neoplasm of upper-outer quadrant of right female breast: Secondary | ICD-10-CM

## 2018-09-09 DIAGNOSIS — Z5189 Encounter for other specified aftercare: Secondary | ICD-10-CM | POA: Diagnosis not present

## 2018-09-09 MED ORDER — PEGFILGRASTIM-CBQV 6 MG/0.6ML ~~LOC~~ SOSY
6.0000 mg | PREFILLED_SYRINGE | Freq: Once | SUBCUTANEOUS | Status: AC
  Administered 2018-09-09: 6 mg via SUBCUTANEOUS

## 2018-09-09 MED ORDER — PEGFILGRASTIM-CBQV 6 MG/0.6ML ~~LOC~~ SOSY
PREFILLED_SYRINGE | SUBCUTANEOUS | Status: AC
  Filled 2018-09-09: qty 0.6

## 2018-09-13 DIAGNOSIS — M549 Dorsalgia, unspecified: Secondary | ICD-10-CM | POA: Insufficient documentation

## 2018-09-17 ENCOUNTER — Encounter: Payer: BLUE CROSS/BLUE SHIELD | Admitting: Rehabilitation

## 2018-09-21 ENCOUNTER — Inpatient Hospital Stay: Payer: BLUE CROSS/BLUE SHIELD | Attending: Hematology and Oncology

## 2018-09-21 ENCOUNTER — Inpatient Hospital Stay: Payer: BLUE CROSS/BLUE SHIELD

## 2018-09-21 ENCOUNTER — Other Ambulatory Visit: Payer: Self-pay

## 2018-09-21 ENCOUNTER — Encounter: Payer: Self-pay | Admitting: Adult Health

## 2018-09-21 ENCOUNTER — Inpatient Hospital Stay (HOSPITAL_BASED_OUTPATIENT_CLINIC_OR_DEPARTMENT_OTHER): Payer: BLUE CROSS/BLUE SHIELD | Admitting: Adult Health

## 2018-09-21 VITALS — BP 141/71 | HR 95 | Temp 98.2°F | Resp 17 | Ht 66.0 in | Wt 214.0 lb

## 2018-09-21 DIAGNOSIS — R5383 Other fatigue: Secondary | ICD-10-CM | POA: Diagnosis not present

## 2018-09-21 DIAGNOSIS — M545 Low back pain: Secondary | ICD-10-CM | POA: Insufficient documentation

## 2018-09-21 DIAGNOSIS — Z5111 Encounter for antineoplastic chemotherapy: Secondary | ICD-10-CM | POA: Diagnosis present

## 2018-09-21 DIAGNOSIS — R110 Nausea: Secondary | ICD-10-CM | POA: Insufficient documentation

## 2018-09-21 DIAGNOSIS — C773 Secondary and unspecified malignant neoplasm of axilla and upper limb lymph nodes: Secondary | ICD-10-CM | POA: Insufficient documentation

## 2018-09-21 DIAGNOSIS — Z803 Family history of malignant neoplasm of breast: Secondary | ICD-10-CM

## 2018-09-21 DIAGNOSIS — Z170 Estrogen receptor positive status [ER+]: Secondary | ICD-10-CM | POA: Diagnosis not present

## 2018-09-21 DIAGNOSIS — Z7984 Long term (current) use of oral hypoglycemic drugs: Secondary | ICD-10-CM | POA: Diagnosis not present

## 2018-09-21 DIAGNOSIS — Z8249 Family history of ischemic heart disease and other diseases of the circulatory system: Secondary | ICD-10-CM

## 2018-09-21 DIAGNOSIS — C50411 Malignant neoplasm of upper-outer quadrant of right female breast: Secondary | ICD-10-CM | POA: Diagnosis not present

## 2018-09-21 DIAGNOSIS — Z5189 Encounter for other specified aftercare: Secondary | ICD-10-CM | POA: Insufficient documentation

## 2018-09-21 DIAGNOSIS — E119 Type 2 diabetes mellitus without complications: Secondary | ICD-10-CM | POA: Diagnosis not present

## 2018-09-21 DIAGNOSIS — D72819 Decreased white blood cell count, unspecified: Secondary | ICD-10-CM | POA: Insufficient documentation

## 2018-09-21 DIAGNOSIS — Z791 Long term (current) use of non-steroidal anti-inflammatories (NSAID): Secondary | ICD-10-CM | POA: Diagnosis not present

## 2018-09-21 DIAGNOSIS — Z800 Family history of malignant neoplasm of digestive organs: Secondary | ICD-10-CM | POA: Insufficient documentation

## 2018-09-21 DIAGNOSIS — Z7982 Long term (current) use of aspirin: Secondary | ICD-10-CM | POA: Insufficient documentation

## 2018-09-21 DIAGNOSIS — C50011 Malignant neoplasm of nipple and areola, right female breast: Secondary | ICD-10-CM

## 2018-09-21 DIAGNOSIS — Z95828 Presence of other vascular implants and grafts: Secondary | ICD-10-CM

## 2018-09-21 DIAGNOSIS — Z79899 Other long term (current) drug therapy: Secondary | ICD-10-CM | POA: Diagnosis not present

## 2018-09-21 LAB — CBC WITH DIFFERENTIAL (CANCER CENTER ONLY)
Abs Immature Granulocytes: 1.04 10*3/uL — ABNORMAL HIGH (ref 0.00–0.07)
Basophils Absolute: 0.1 10*3/uL (ref 0.0–0.1)
Basophils Relative: 1 %
Eosinophils Absolute: 0.1 10*3/uL (ref 0.0–0.5)
Eosinophils Relative: 1 %
HCT: 31.6 % — ABNORMAL LOW (ref 36.0–46.0)
Hemoglobin: 9.9 g/dL — ABNORMAL LOW (ref 12.0–15.0)
Immature Granulocytes: 14 %
Lymphocytes Relative: 8 %
Lymphs Abs: 0.6 10*3/uL — ABNORMAL LOW (ref 0.7–4.0)
MCH: 26.6 pg (ref 26.0–34.0)
MCHC: 31.3 g/dL (ref 30.0–36.0)
MCV: 84.9 fL (ref 80.0–100.0)
Monocytes Absolute: 0.9 10*3/uL (ref 0.1–1.0)
Monocytes Relative: 12 %
Neutro Abs: 4.7 10*3/uL (ref 1.7–7.7)
Neutrophils Relative %: 64 %
Platelet Count: 179 10*3/uL (ref 150–400)
RBC: 3.72 MIL/uL — ABNORMAL LOW (ref 3.87–5.11)
RDW: 17.2 % — ABNORMAL HIGH (ref 11.5–15.5)
WBC Count: 7.4 10*3/uL (ref 4.0–10.5)
nRBC: 0.5 % — ABNORMAL HIGH (ref 0.0–0.2)

## 2018-09-21 LAB — CMP (CANCER CENTER ONLY)
ALT: 18 U/L (ref 0–44)
AST: 16 U/L (ref 15–41)
Albumin: 3.9 g/dL (ref 3.5–5.0)
Alkaline Phosphatase: 99 U/L (ref 38–126)
Anion gap: 10 (ref 5–15)
BUN: 11 mg/dL (ref 8–23)
CO2: 28 mmol/L (ref 22–32)
Calcium: 8.8 mg/dL — ABNORMAL LOW (ref 8.9–10.3)
Chloride: 107 mmol/L (ref 98–111)
Creatinine: 0.92 mg/dL (ref 0.44–1.00)
GFR, Est AFR Am: >60 mL/min (ref >60)
GFR, Estimated: >60 mL/min (ref >60)
Glucose, Bld: 101 mg/dL — ABNORMAL HIGH (ref 70–99)
Potassium: 3.6 mmol/L (ref 3.5–5.1)
Sodium: 145 mmol/L (ref 135–145)
Total Bilirubin: 0.4 mg/dL (ref 0.3–1.2)
Total Protein: 6.7 g/dL (ref 6.5–8.1)

## 2018-09-21 MED ORDER — PALONOSETRON HCL INJECTION 0.25 MG/5ML
INTRAVENOUS | Status: AC
  Filled 2018-09-21: qty 5

## 2018-09-21 MED ORDER — DOXORUBICIN HCL CHEMO IV INJECTION 2 MG/ML
60.0000 mg/m2 | Freq: Once | INTRAVENOUS | Status: AC
  Administered 2018-09-21: 130 mg via INTRAVENOUS
  Filled 2018-09-21: qty 65

## 2018-09-21 MED ORDER — PALONOSETRON HCL INJECTION 0.25 MG/5ML
0.2500 mg | Freq: Once | INTRAVENOUS | Status: AC
  Administered 2018-09-21: 0.25 mg via INTRAVENOUS

## 2018-09-21 MED ORDER — SODIUM CHLORIDE 0.9% FLUSH
10.0000 mL | Freq: Once | INTRAVENOUS | Status: AC
  Administered 2018-09-21: 10 mL
  Filled 2018-09-21: qty 10

## 2018-09-21 MED ORDER — SODIUM CHLORIDE 0.9 % IV SOLN
600.0000 mg/m2 | Freq: Once | INTRAVENOUS | Status: AC
  Administered 2018-09-21: 1300 mg via INTRAVENOUS
  Filled 2018-09-21: qty 65

## 2018-09-21 MED ORDER — SODIUM CHLORIDE 0.9 % IV SOLN
Freq: Once | INTRAVENOUS | Status: AC
  Administered 2018-09-21: 13:00:00 via INTRAVENOUS
  Filled 2018-09-21: qty 5

## 2018-09-21 MED ORDER — SODIUM CHLORIDE 0.9 % IV SOLN
Freq: Once | INTRAVENOUS | Status: AC
  Administered 2018-09-21: 13:00:00 via INTRAVENOUS
  Filled 2018-09-21: qty 250

## 2018-09-21 MED ORDER — HEPARIN SOD (PORK) LOCK FLUSH 100 UNIT/ML IV SOLN
500.0000 [IU] | Freq: Once | INTRAVENOUS | Status: AC | PRN
  Administered 2018-09-21: 500 [IU]
  Filled 2018-09-21: qty 5

## 2018-09-21 MED ORDER — SODIUM CHLORIDE 0.9% FLUSH
10.0000 mL | INTRAVENOUS | Status: DC | PRN
  Administered 2018-09-21: 10 mL
  Filled 2018-09-21: qty 10

## 2018-09-21 NOTE — Progress Notes (Signed)
[facility] Follow up:    [name], [place] [address]   DIAGNOSIS: Cancer Staging Malignant neoplasm of upper-outer quadrant of right breast in female, estrogen receptor positive ([place]) Staging form: Breast, AJCC 8th Edition - Clinical: Stage IB (cT1c, cN1, cM0, G2, ER+, PR+, HER2-) - Signed by Gardenia Phlegm, NP on 12/23/2017   SUMMARY OF ONCOLOGIC HISTORY:   Malignant neoplasm of upper-outer quadrant of right breast in female, estrogen receptor positive ([place])   11/12/2017 Initial Diagnosis    Palpable lesion in the right breast upper outer quadrant mammogram and ultrasound revealed 1.4 cm tumor ultrasound-guided biopsy revealed grade 2 IDC ER 3+, PR 3+, HER-2 negative with a Ki-67 of 10%.  Right axillary lymph node biopsy positive, T1cN1 stage I a clinical stage    12/23/2017 Cancer Staging    Staging form: Breast, AJCC 8th Edition - Clinical: Stage IB (cT1c, cN1, cM0, G2, ER+, PR+, HER2-) - Signed by Gardenia Phlegm, NP on 12/23/2017    01/10/2018 Breast MRI    Biopsy-proven malignancy UOQ right breast measuring 6 cm.  Anterior inferior to this is a 1 cm mass which needs biopsy.  Focal non-mass enhancement LOQ retroareolar right breast, left breast 7 mm irregular enhancement, 2 suspicious right axillary lymph nodes    01/27/2018 Procedure    Right breast biopsy UOQ: Grade 1 ILC, ALH Right breast biopsy 6:00: LCIS Left breast biopsy: Benign    06/21/2018 Surgery    Rt Mastectomy: 7 cm fibrotic mass ILC Grade 2, 5/6 LN Positive, ER 90-100%, PR 5%-70%, Ki 67 2-10%, Her 2 Neg, T3N2    07/13/2018 Surgery    Right axillary lymph node dissection: 3/10 lymph nodes positive making total number of lymph nodes 8/16    08/10/2018 -  Chemotherapy    Adjuvant chemotherapy with dose dense Adriamycin and Cytoxan x4 followed by Taxol weekly x12     CURRENT THERAPY: Adriamycin and Cytoxan  INTERVAL HISTORY: VERSIA MIGNOGNA 68 y.o. female returns for evaluation prior to her fourth cycle of Adriamycin and Cytoxan.  Seh notes she did well after treatment and was happy that Saturday and Sunday she felt improved.  She had her typical pain, her nausea was controlled.    Martena is isolating during the [place] pandemic, her granddaughter who lives with her has been taken out of day care.  Her daughter in law works from home.  Her son is a news reporter and works outside of the home.  Her husband is a Theme park manager, so he is mainly at home.  Donald Pore is practicing social distancing, wearing masks, and remaining clean.     Patient Active Problem List   Diagnosis Date Noted  . Port-A-Cath in place 08/24/2018  . Breast cancer, right ([place]) 06/21/2018  . Genetic testing 01/14/2018  . Family history of colon cancer   . Family history of breast cancer   . Malignant neoplasm of upper-outer quadrant of right breast in female, estrogen receptor positive ([place]) 12/15/2017    has No Known Allergies.  MEDICAL HISTORY: Past Medical History:  Diagnosis Date  . Arthritis   . Breast cancer in female [facility])    Right  . Cancer ([place])   . Diabetes mellitus without complication ([place])   . Family history of breast cancer   . Family history of colon cancer   . Hypertension    takes fluid pills  . Hypothyroidism   . Pink eye disease of left eye  went to eye md and received drops - yesterday.  . Sleep apnea    back in the 1990's  . Vitiligo     SURGICAL HISTORY: Past Surgical History:  Procedure Laterality Date  . AXILLARY LYMPH NODE DISSECTION Right 07/13/2018   Procedure: RIGHT AXILLARY LYMPH NODE DISSECTION;  Surgeon: [name], MD;  Location: [facility];  Service: General;  Laterality: Right;  . BREAST SURGERY     left breast fibro adenoma  . CATARACT EXTRACTION Right 2019  . CERVICAL FUSION N/A    C5 &6  . CESAREAN SECTION    . CHOLECYSTECTOMY  2004-2005  . CYSTECTOMY     patient denies  . EYE SURGERY    .  IR IMAGING GUIDED PORT INSERTION  07/29/2018  . MASTECTOMY WITH RADIOACTIVE SEED GUIDED EXCISION AND AXILLARY SENTINEL LYMPH NODE BIOPSY Right 06/21/2018  . MASTECTOMY WITH RADIOACTIVE SEED GUIDED EXCISION AND AXILLARY SENTINEL LYMPH NODE BIOPSY Right 06/21/2018   Procedure: RIGHT TOTAL MASTECTOMY WITH RIGHT AXILLARY SENTINEL NODE BIOPSY AND RIGHT SEED GUIDED NODE EXCISION;  Surgeon: [name], MD;  Location: [facility];  Service: General;  Laterality: Right;  . MYOMECTOMY  1986  . PARTIAL HYSTERECTOMY  1995  . PILONIDAL CYST EXCISION    . PORTACATH PLACEMENT N/A 07/13/2018   Procedure: INSERTION PORT-A-CATH WITH ULTRASOUND;  Surgeon: [name], MD;  Location: [facility];  Service: General;  Laterality: N/A;  . TONSILLECTOMY  2002  . UVULOPALATOPHARYNGOPLASTY, TONSILLECTOMY AND SEPTOPLASTY      SOCIAL HISTORY: Social History   Socioeconomic History  . Marital status: Married    Spouse name: Not on file  . Number of children: Not on file  . Years of education: Not on file  . Highest education level: Not on file  Occupational History  . Not on file  Social Needs  . Financial resource strain: Not on file  . Food insecurity:    Worry: Not on file    Inability: Not on file  . Transportation needs:    Medical: Not on file    Non-medical: Not on file  Tobacco Use  . Smoking status: Never Smoker  . Smokeless tobacco: Never Used  Substance and Sexual Activity  . Alcohol use: Not Currently  . Drug use: Never  . Sexual activity: Not on file  Lifestyle  . Physical activity:    Days per week: Not on file    Minutes per session: Not on file  . Stress: Not on file  Relationships  . Social connections:    Talks on phone: Not on file    Gets together: Not on file    Attends religious service: Not on file    Active member of club or organization: Not on file    Attends meetings of clubs or organizations: Not on file    Relationship status: Not on file  . Intimate partner  violence:    Fear of current or ex partner: Not on file    Emotionally abused: Not on file    Physically abused: Not on file    Forced sexual activity: Not on file  Other Topics Concern  . Not on file  Social History Narrative  . Not on file    FAMILY HISTORY: Family History  Problem Relation Age of Onset  . Colon cancer Mother 31  . Colon cancer Sister 66  . Colon cancer Brother        ex early 54's  . Breast cancer Other  40's    Review of Systems  Constitutional: Positive for fatigue. Negative for appetite change, chills, fever and unexpected weight change.  HENT:   Negative for hearing loss, lump/mass and mouth sores.   Eyes: Negative for eye problems and icterus.  Respiratory: Negative for chest tightness, cough and shortness of breath.   Cardiovascular: Negative for chest pain, leg swelling and palpitations.  Gastrointestinal: Positive for nausea. Negative for abdominal distention, abdominal pain, constipation, diarrhea and vomiting.  Endocrine: Negative for hot flashes.  Skin: Negative for itching and rash.  Neurological: Negative for dizziness, extremity weakness, headaches and numbness.  Hematological: Negative for adenopathy. Does not bruise/bleed easily.  Psychiatric/Behavioral: Negative for depression. The patient is not nervous/anxious.       PHYSICAL EXAMINATION  ECOG PERFORMANCE STATUS: 1 - Symptomatic but completely ambulatory  Vitals:   09/21/18 1146  BP: (!) 141/71  Pulse: 95  Resp: 17  Temp: 98.2 F (36.8 C)  SpO2: 98%    Physical Exam Constitutional:      General: She is not in acute distress.    Appearance: Normal appearance. She is not toxic-appearing.  HENT:     Head: Normocephalic and atraumatic.     Mouth/Throat:     Mouth: Mucous membranes are moist.     Pharynx: Oropharynx is clear. No oropharyngeal exudate or posterior oropharyngeal erythema.  Eyes:     General: No scleral icterus.    Pupils: Pupils are equal, round, and  reactive to light.  Neck:     Musculoskeletal: Neck supple.  Cardiovascular:     Rate and Rhythm: Normal rate and regular rhythm.     Pulses: Normal pulses.     Heart sounds: Normal heart sounds.  Pulmonary:     Effort: Pulmonary effort is normal.     Breath sounds: Normal breath sounds.  Abdominal:     General: Abdomen is flat. There is no distension.     Palpations: Abdomen is soft.     Tenderness: There is no abdominal tenderness.  Lymphadenopathy:     Cervical: No cervical adenopathy.  Neurological:     Mental Status: She is alert.     LABORATORY DATA:  CBC    Component Value Date/Time   WBC 7.4 09/21/2018 1133   WBC 6.1 07/29/2018 0836   RBC 3.72 (L) 09/21/2018 1133   HGB 9.9 (L) 09/21/2018 1133   HCT 31.6 (L) 09/21/2018 1133   PLT 179 09/21/2018 1133   MCV 84.9 09/21/2018 1133   MCH 26.6 09/21/2018 1133   MCHC 31.3 09/21/2018 1133   RDW 17.2 (H) 09/21/2018 1133   LYMPHSABS 0.6 (L) 09/21/2018 1133   MONOABS 0.9 09/21/2018 1133   EOSABS 0.1 09/21/2018 1133   BASOSABS 0.1 09/21/2018 1133    CMP     Component Value Date/Time   NA 145 09/21/2018 1133   K 3.6 09/21/2018 1133   CL 107 09/21/2018 1133   CO2 28 09/21/2018 1133   GLUCOSE 101 (H) 09/21/2018 1133   BUN 11 09/21/2018 1133   CREATININE 0.92 09/21/2018 1133   CALCIUM 8.8 (L) 09/21/2018 1133   PROT 6.7 09/21/2018 1133   ALBUMIN 3.9 09/21/2018 1133   AST 16 09/21/2018 1133   ALT 18 09/21/2018 1133   ALKPHOS 99 09/21/2018 1133   BILITOT 0.4 09/21/2018 1133   GFRNONAA >60 09/21/2018 1133   GFRAA >60 09/21/2018 1133           ASSESSMENT and THERAPY PLAN:   Malignant neoplasm of upper-outer quadrant  of right breast in female, estrogen receptor positive ([place]) 06/21/17:Rt Mastectomy: 7 cm fibrotic mass ILC Grade 2, 5/6 LN Positive, ER 90-100%, PR 5%-70%, Ki 67 2-10%, Her 2 Neg, T3N2 Treatment plan: 1. Adj chemo 2. Adj RT 3. Adj Anti estrogen therapy  CT CAP: 07/02/2018: Subtle  hypoattenuating lesion upper pole left kidney could be a cyst, urology consulted, left adrenal adenoma. --------------------------------------------------------------------------------------------------------------------- Current treatment: Cycle 4 day 1 dose dense Adriamycin and Cytoxan Labs reviewed Echocardiogram: 07/02/2018: EF 60 to 65%  Chemo toxicities:  1.  Diarrhea, minimal 2.  Mild nausea-controlled with anti emetics 3.  Mild fatigue 4.  Rash in the face that lasted for 1 day--resolved 5. Pain in lower back and legs: likely related to Udenyca, stable Return to clinic in 2 weeks for Taxol--reviewed in detail    All questions were answered. The patient knows to call the clinic with any problems, questions or concerns. We can certainly see the patient much sooner if necessary.  A total of (20) minutes of face-to-face time was spent with this patient with greater than 50% of that time in counseling and care-coordination.  This note was electronically signed. Scot Dock, NP 09/22/2018

## 2018-09-21 NOTE — Patient Instructions (Signed)
[facility] Discharge Instructions for Patients Receiving Chemotherapy  Today you received the following chemotherapy agents: Doxorubicin and Cytoxan.  To help prevent nausea and vomiting after your treatment, we encourage you to take your nausea medication. DO NOT TAKE ZOFRAN FOR THREE DAYS AFTER TREATMENT.   If you develop nausea and vomiting that is not controlled by your nausea medication, call the clinic.   BELOW ARE SYMPTOMS THAT SHOULD BE REPORTED IMMEDIATELY:  *FEVER GREATER THAN 100.5 F  *CHILLS WITH OR WITHOUT FEVER  NAUSEA AND VOMITING THAT IS NOT CONTROLLED WITH YOUR NAUSEA MEDICATION  *UNUSUAL SHORTNESS OF BREATH  *UNUSUAL BRUISING OR BLEEDING  TENDERNESS IN MOUTH AND THROAT WITH OR WITHOUT PRESENCE OF ULCERS  *URINARY PROBLEMS  *BOWEL PROBLEMS  UNUSUAL RASH Items with * indicate a potential emergency and should be followed up as soon as possible.  Feel free to call the clinic should you have any questions or concerns. The clinic phone number is [phone].  Please show the [place] at check-in to the Emergency Department and triage nurse.

## 2018-09-21 NOTE — Assessment & Plan Note (Addendum)
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 Adj chemo 2. Adj RT 3. Adj Anti estrogen therapy  CT CAP: 07/02/2018: Subtle hypoattenuating lesion upper pole left kidney could be a cyst, urology consulted, left adrenal adenoma. --------------------------------------------------------------------------------------------------------------------- Current treatment: Cycle 4 day 1 dose dense Adriamycin and Cytoxan Labs reviewed Echocardiogram: 07/02/2018: EF 60 to 65%  Chemo toxicities:  1.  Diarrhea, minimal 2.  Mild nausea-controlled with anti emetics 3.  Mild fatigue 4.  Rash in the face that lasted for 1 day--resolved 5. Pain in lower back and legs: likely related to Udenyca, stable Return to clinic in 2 weeks for Taxol--reviewed in detail

## 2018-09-22 ENCOUNTER — Telehealth: Payer: Self-pay | Admitting: Hematology and Oncology

## 2018-09-22 NOTE — Telephone Encounter (Signed)
Called regarding 4/22 moved to next day

## 2018-09-23 ENCOUNTER — Inpatient Hospital Stay: Payer: BLUE CROSS/BLUE SHIELD

## 2018-09-23 ENCOUNTER — Other Ambulatory Visit: Payer: Self-pay

## 2018-09-23 DIAGNOSIS — Z5111 Encounter for antineoplastic chemotherapy: Secondary | ICD-10-CM | POA: Diagnosis not present

## 2018-09-23 DIAGNOSIS — Z170 Estrogen receptor positive status [ER+]: Principal | ICD-10-CM

## 2018-09-23 DIAGNOSIS — C50411 Malignant neoplasm of upper-outer quadrant of right female breast: Secondary | ICD-10-CM

## 2018-09-23 MED ORDER — PEGFILGRASTIM-CBQV 6 MG/0.6ML ~~LOC~~ SOSY
6.0000 mg | PREFILLED_SYRINGE | Freq: Once | SUBCUTANEOUS | Status: AC
  Administered 2018-09-23: 6 mg via SUBCUTANEOUS

## 2018-09-23 MED ORDER — PEGFILGRASTIM-CBQV 6 MG/0.6ML ~~LOC~~ SOSY
PREFILLED_SYRINGE | SUBCUTANEOUS | Status: AC
  Filled 2018-09-23: qty 0.6

## 2018-10-05 NOTE — Progress Notes (Signed)
Patient Care Team: [name], MD as PCP - General (Family Medicine)  DIAGNOSIS:    ICD-10-CM   1. Malignant neoplasm of upper-outer quadrant of right breast in female, estrogen receptor positive ([place]) C50.411    Z17.0     SUMMARY OF ONCOLOGIC HISTORY:   Malignant neoplasm of upper-outer quadrant of right breast in female, estrogen receptor positive ([place])   11/12/2017 Initial Diagnosis    Palpable lesion in the right breast upper outer quadrant mammogram and ultrasound revealed 1.4 cm tumor ultrasound-guided biopsy revealed grade 2 IDC ER 3+, PR 3+, HER-2 negative with a Ki-67 of 10%.  Right axillary lymph node biopsy positive, T1cN1 stage I a clinical stage    12/23/2017 Cancer Staging    Staging form: Breast, AJCC 8th Edition - Clinical: Stage IB (cT1c, cN1, cM0, G2, ER+, PR+, HER2-) - Signed by Gardenia Phlegm, NP on 12/23/2017    01/10/2018 Breast MRI    Biopsy-proven malignancy UOQ right breast measuring 6 cm.  Anterior inferior to this is a 1 cm mass which needs biopsy.  Focal non-mass enhancement LOQ retroareolar right breast, left breast 7 mm irregular enhancement, 2 suspicious right axillary lymph nodes    01/27/2018 Procedure    Right breast biopsy UOQ: Grade 1 ILC, ALH Right breast biopsy 6:00: LCIS Left breast biopsy: Benign    06/21/2018 Surgery    Rt Mastectomy: 7 cm fibrotic mass ILC Grade 2, 5/6 LN Positive, ER 90-100%, PR 5%-70%, Ki 67 2-10%, Her 2 Neg, T3N2    07/13/2018 Surgery    Right axillary lymph node dissection: 3/10 lymph nodes positive making total number of lymph nodes 8/16    08/10/2018 -  Chemotherapy    Adjuvant chemotherapy with dose dense Adriamycin and Cytoxan x4 followed by Taxol weekly x12     CHIEF COMPLIANT: Cycle 1 Taxol (chemo postponed for low white blood cell count)  INTERVAL HISTORY: Charlene Perry is a 68 y.o. with above-mentioned history of right breast cancer who underwent a right mastectomyand completed  4 cycles of adjuvant chemotherapy with dose dense Adriamycin and Cytoxan.She presents to the clinic alone today to begin weekly Taxol treatments.  Today her WBC count is low and hence she cannot receive her treatment.  She has not had any fevers or chills her energy levels are adequate.  She is more anxious about starting the new treatment and could not sleep last night.  REVIEW OF SYSTEMS:   Constitutional: Denies fevers, chills or abnormal weight loss Eyes: Denies blurriness of vision Ears, nose, mouth, throat, and face: Denies mucositis or sore throat Respiratory: Denies cough, dyspnea or wheezes Cardiovascular: Denies palpitation, chest discomfort Gastrointestinal: Denies nausea, heartburn or change in bowel habits Skin: Denies abnormal skin rashes Lymphatics: Denies new lymphadenopathy or easy bruising Neurological: Denies numbness, tingling or new weaknesses Behavioral/Psych: Anxiety over starting Taxol Extremities: No lower extremity edema Breast: denies any pain or lumps or nodules in either breasts All other systems were reviewed with the patient and are negative.  I have reviewed the past medical history, past surgical history, social history and family history with the patient and they are unchanged from previous note.  ALLERGIES:  has No Known Allergies.  MEDICATIONS:  Current Outpatient Medications  Medication Sig Dispense Refill  . aspirin EC 81 MG tablet Take 81 mg by mouth daily.    . celecoxib (CELEBREX) 200 MG capsule Take 200 mg by mouth daily.    . cholecalciferol (VITAMIN D3) 25 MCG (1000 UT) tablet  Take 1 tablet (1,000 Units total) by mouth daily.    . furosemide (LASIX) 40 MG tablet Take 80 mg by mouth daily.    Marland Kitchen gabapentin (NEURONTIN) 400 MG capsule Take 400 mg by mouth 2 (two) times daily.    Marland Kitchen levothyroxine (SYNTHROID, LEVOTHROID) 125 MCG tablet Take 125 mcg by mouth daily before breakfast.    . lidocaine-prilocaine (EMLA) cream Apply to affected area once 30  g 3  . LORazepam (ATIVAN) 0.5 MG tablet Take 1 tablet (0.5 mg total) by mouth at bedtime as needed (Nausea or vomiting). 30 tablet 0  . metFORMIN (GLUCOPHAGE) 1000 MG tablet Take 1,000 mg by mouth 2 (two) times daily with a meal.    . ondansetron (ZOFRAN) 8 MG tablet Take 1 tablet (8 mg total) by mouth 2 (two) times daily as needed. Start on the third day after chemotherapy. 30 tablet 1  . pantoprazole (PROTONIX) 40 MG tablet Take 1 tablet (40 mg total) by mouth daily. 30 tablet 1  . potassium chloride (K-DUR,KLOR-CON) 10 MEQ tablet Take 20 mEq by mouth daily.     . prochlorperazine (COMPAZINE) 10 MG tablet Take 1 tablet (10 mg total) by mouth every 6 (six) hours as needed (Nausea or vomiting). 30 tablet 1  . rosuvastatin (CRESTOR) 40 MG tablet Take 40 mg by mouth at bedtime.      No current facility-administered medications for this visit.     PHYSICAL EXAMINATION: ECOG PERFORMANCE STATUS: 1 - Symptomatic but completely ambulatory  Vitals:   10/06/18 0924  BP: (!) 123/59  Pulse: (!) 112  Resp: 18  Temp: 98.6 F (37 C)  SpO2: 97%   Filed Weights   10/06/18 0924  Weight: 206 lb 11.2 oz (93.8 kg)    GENERAL: alert, no distress and comfortable SKIN: skin color, texture, turgor are normal, no rashes or significant lesions EYES: normal, Conjunctiva are pink and non-injected, sclera clear OROPHARYNX: no exudate, no erythema and lips, buccal mucosa, and tongue normal  NECK: supple, thyroid normal size, non-tender, without nodularity LYMPH: no palpable lymphadenopathy in the cervical, axillary or inguinal LUNGS: clear to auscultation and percussion with normal breathing effort HEART: regular rate & rhythm and no murmurs and no lower extremity edema ABDOMEN: abdomen soft, non-tender and normal bowel sounds MUSCULOSKELETAL: no cyanosis of digits and no clubbing  NEURO: alert & oriented x 3 with fluent speech, no focal motor/sensory deficits EXTREMITIES: No lower extremity edema   LABORATORY DATA:  I have reviewed the data as listed CMP Latest Ref Rng & Units 09/21/2018 09/07/2018 08/24/2018  Glucose 70 - 99 mg/dL 101(H) 207(H) 173(H)  BUN 8 - 23 mg/dL '11 12 12  '$ Creatinine 0.44 - 1.00 mg/dL 0.92 0.97 0.99  Sodium 135 - 145 mmol/L 145 143 144  Potassium 3.5 - 5.1 mmol/L 3.6 4.0 3.7  Chloride 98 - 111 mmol/L 107 104 103  CO2 22 - 32 mmol/L '28 25 27  '$ Calcium 8.9 - 10.3 mg/dL 8.8(L) 8.9 8.8(L)  Total Protein 6.5 - 8.1 g/dL 6.7 6.8 7.2  Total Bilirubin 0.3 - 1.2 mg/dL 0.4 0.3 0.3  Alkaline Phos 38 - 126 U/L 99 123 107  AST 15 - 41 U/L 16 15 14(L)  ALT 0 - 44 U/L '18 17 18    '$ Lab Results  Component Value Date   WBC 0.6 (LL) 10/06/2018   HGB 9.0 (L) 10/06/2018   HCT 27.8 (L) 10/06/2018   MCV 85.5 10/06/2018   PLT 187 10/06/2018   NEUTROABS PENDING  10/06/2018    ASSESSMENT & PLAN:  Malignant neoplasm of upper-outer quadrant of right breast in female, estrogen receptor positive ([place]) 06/21/17:Rt Mastectomy: 7 cm fibrotic mass ILC Grade 2, 5/6 LN Positive, ER 90-100%, PR 5%-70%, Ki 67 2-10%, Her 2 Neg, T3N2 Treatment plan: 1. Adj chemo 2. Adj RT 3. Adj Anti estrogen therapy  CT CAP: 07/02/2018: Subtle hypoattenuating lesion upper pole left kidney could be a cyst, urology consulted, left adrenal adenoma. --------------------------------------------------------------------------------------------------------------------- Current treatment: Completed 4 cycles ofdose dense Adriamycin and Cytoxan, Today is cycle 1 Taxol being held because of low white blood cell count Labs reviewed Echocardiogram: 07/02/2018: EF 60 to 65%  Chemo toxicities: 1.Diarrhea, minimal 2.Mild nausea-controlled with anti emetics 3.Mild fatigue 4.  Leukopenia: Because of white count of 0.6, we are canceling today's chemotherapy.  She will come back next week and we will reassess. I instructed her about avoiding outside exposure.  Return to clinic in 1 week to start Taxol.   No  orders of the defined types were placed in this encounter.  The patient has a good understanding of the overall plan. she agrees with it. she will call with any problems that may develop before the next visit here.  [name], MD 10/06/2018  Julious Oka Dorshimer am acting as scribe for Dr. [name].  I have reviewed the above documentation for accuracy and completeness, and I agree with the above.

## 2018-10-06 ENCOUNTER — Inpatient Hospital Stay: Payer: BLUE CROSS/BLUE SHIELD

## 2018-10-06 ENCOUNTER — Other Ambulatory Visit: Payer: Self-pay

## 2018-10-06 ENCOUNTER — Inpatient Hospital Stay (HOSPITAL_BASED_OUTPATIENT_CLINIC_OR_DEPARTMENT_OTHER): Payer: BLUE CROSS/BLUE SHIELD | Admitting: Hematology and Oncology

## 2018-10-06 VITALS — BP 123/59 | HR 112 | Temp 98.6°F | Resp 18 | Ht 66.0 in | Wt 206.7 lb

## 2018-10-06 DIAGNOSIS — C50411 Malignant neoplasm of upper-outer quadrant of right female breast: Secondary | ICD-10-CM

## 2018-10-06 DIAGNOSIS — Z95828 Presence of other vascular implants and grafts: Secondary | ICD-10-CM

## 2018-10-06 DIAGNOSIS — C50011 Malignant neoplasm of nipple and areola, right female breast: Secondary | ICD-10-CM

## 2018-10-06 DIAGNOSIS — M545 Low back pain: Secondary | ICD-10-CM

## 2018-10-06 DIAGNOSIS — D72819 Decreased white blood cell count, unspecified: Secondary | ICD-10-CM | POA: Diagnosis not present

## 2018-10-06 DIAGNOSIS — C773 Secondary and unspecified malignant neoplasm of axilla and upper limb lymph nodes: Secondary | ICD-10-CM

## 2018-10-06 DIAGNOSIS — Z8249 Family history of ischemic heart disease and other diseases of the circulatory system: Secondary | ICD-10-CM

## 2018-10-06 DIAGNOSIS — R110 Nausea: Secondary | ICD-10-CM

## 2018-10-06 DIAGNOSIS — Z79899 Other long term (current) drug therapy: Secondary | ICD-10-CM

## 2018-10-06 DIAGNOSIS — Z7982 Long term (current) use of aspirin: Secondary | ICD-10-CM

## 2018-10-06 DIAGNOSIS — R5383 Other fatigue: Secondary | ICD-10-CM

## 2018-10-06 DIAGNOSIS — Z5111 Encounter for antineoplastic chemotherapy: Secondary | ICD-10-CM | POA: Diagnosis not present

## 2018-10-06 DIAGNOSIS — Z791 Long term (current) use of non-steroidal anti-inflammatories (NSAID): Secondary | ICD-10-CM

## 2018-10-06 DIAGNOSIS — Z800 Family history of malignant neoplasm of digestive organs: Secondary | ICD-10-CM

## 2018-10-06 DIAGNOSIS — Z7984 Long term (current) use of oral hypoglycemic drugs: Secondary | ICD-10-CM

## 2018-10-06 DIAGNOSIS — Z803 Family history of malignant neoplasm of breast: Secondary | ICD-10-CM

## 2018-10-06 DIAGNOSIS — Z170 Estrogen receptor positive status [ER+]: Secondary | ICD-10-CM

## 2018-10-06 DIAGNOSIS — E119 Type 2 diabetes mellitus without complications: Secondary | ICD-10-CM

## 2018-10-06 LAB — CMP (CANCER CENTER ONLY)
ALT: 23 U/L (ref 0–44)
AST: 21 U/L (ref 15–41)
Albumin: 3.8 g/dL (ref 3.5–5.0)
Alkaline Phosphatase: 93 U/L (ref 38–126)
Anion gap: 10 (ref 5–15)
BUN: 16 mg/dL (ref 8–23)
CO2: 27 mmol/L (ref 22–32)
Calcium: 9 mg/dL (ref 8.9–10.3)
Chloride: 105 mmol/L (ref 98–111)
Creatinine: 1.02 mg/dL — ABNORMAL HIGH (ref 0.44–1.00)
GFR, Est AFR Am: >60 mL/min
GFR, Estimated: 58 mL/min — ABNORMAL LOW
Glucose, Bld: 106 mg/dL — ABNORMAL HIGH (ref 70–99)
Potassium: 3.7 mmol/L (ref 3.5–5.1)
Sodium: 142 mmol/L (ref 135–145)
Total Bilirubin: 0.4 mg/dL (ref 0.3–1.2)
Total Protein: 6.9 g/dL (ref 6.5–8.1)

## 2018-10-06 LAB — CBC WITH DIFFERENTIAL (CANCER CENTER ONLY)
Abs Immature Granulocytes: 0.03 10*3/uL (ref 0.00–0.07)
Basophils Absolute: 0 10*3/uL (ref 0.0–0.1)
Basophils Relative: 0 %
Eosinophils Absolute: 0 10*3/uL (ref 0.0–0.5)
Eosinophils Relative: 2 %
HCT: 27.8 % — ABNORMAL LOW (ref 36.0–46.0)
Hemoglobin: 9 g/dL — ABNORMAL LOW (ref 12.0–15.0)
Immature Granulocytes: 5 %
Lymphocytes Relative: 35 %
Lymphs Abs: 0.2 10*3/uL — ABNORMAL LOW (ref 0.7–4.0)
MCH: 27.7 pg (ref 26.0–34.0)
MCHC: 32.4 g/dL (ref 30.0–36.0)
MCV: 85.5 fL (ref 80.0–100.0)
Monocytes Absolute: 0.2 10*3/uL (ref 0.1–1.0)
Monocytes Relative: 27 %
Neutro Abs: 0.2 10*3/uL — CL (ref 1.7–7.7)
Neutrophils Relative %: 31 %
Platelet Count: 187 10*3/uL (ref 150–400)
RBC: 3.25 MIL/uL — ABNORMAL LOW (ref 3.87–5.11)
RDW: 17.4 % — ABNORMAL HIGH (ref 11.5–15.5)
WBC Count: 0.6 10*3/uL — CL (ref 4.0–10.5)
nRBC: 0 % (ref 0.0–0.2)

## 2018-10-06 MED ORDER — HEPARIN SOD (PORK) LOCK FLUSH 100 UNIT/ML IV SOLN
500.0000 [IU] | Freq: Once | INTRAVENOUS | Status: AC
  Administered 2018-10-06: 500 [IU]
  Filled 2018-10-06: qty 5

## 2018-10-06 MED ORDER — SODIUM CHLORIDE 0.9% FLUSH
10.0000 mL | Freq: Once | INTRAVENOUS | Status: AC
  Administered 2018-10-06: 10 mL
  Filled 2018-10-06: qty 10

## 2018-10-06 NOTE — Assessment & Plan Note (Signed)
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 Adj chemo 2. Adj RT 3. Adj Anti estrogen therapy  CT CAP: 07/02/2018: Subtle hypoattenuating lesion upper pole left kidney could be a cyst, urology consulted, left adrenal adenoma. --------------------------------------------------------------------------------------------------------------------- Current treatment: Completed 4 cycles ofdose dense Adriamycin and Cytoxan, Today is cycle 1 Taxol Labs reviewed Echocardiogram: 07/02/2018: EF 60 to 65%  Chemo toxicities: 1.Diarrhea, minimal 2.Mild nausea-controlled with anti emetics 3.Mild fatigue  Return to clinic weekly for Taxol

## 2018-10-06 NOTE — Progress Notes (Unsigned)
Spoke w/ Dr. Lindi Adie, patient's paclitaxel treatment initiation will be postpone d/t WBC not recovering from last [facility] cycle.   Demetrius Charity, PharmD, [facility] Pharmac[facility]acy Phone: [phone] 10/06/2018

## 2018-10-11 IMAGING — CT CT ABDOMEN W/O CM
1 of 2 series · 14 of 32 positions shown, 19 images · non-contrast
Comparison: Contrast-enhanced AP CT on 02/10/2018

CLINICAL DATA: Indeterminate left adrenal nodule. Personal history
of right breast carcinoma.

EXAM:
CT ABDOMEN WITHOUT CONTRAST
TECHNIQUE: Multidetector CT imaging of the abdomen was performed following the
standard protocol without IV contrast.

[Series 2: adrenal w/(date) · axial · 0.83mm/px · z∈[-253,-61]mm · 14 of 72 slices shown, 19 images]
[im 4/72  soft-tissue]
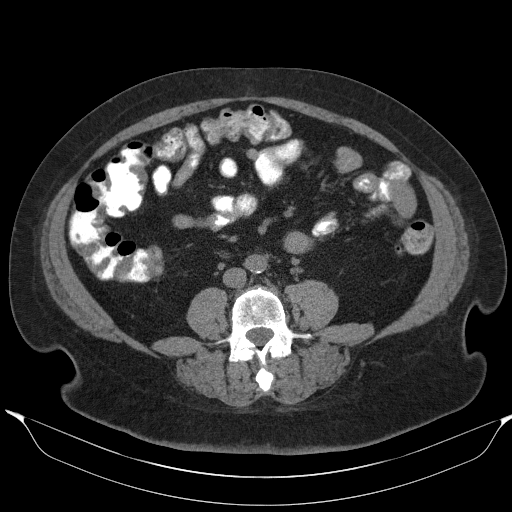
[im 4/72  bone]
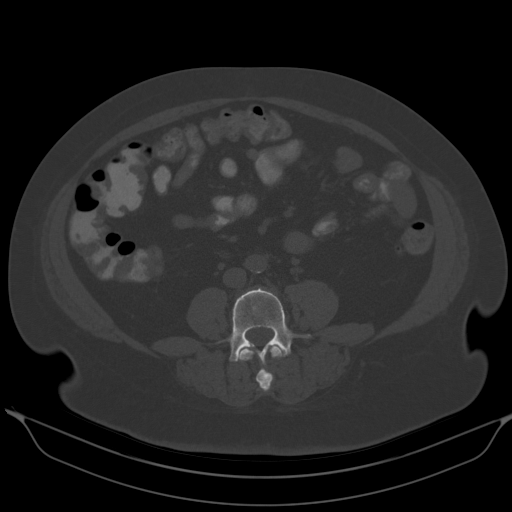
[im 8/72  soft-tissue]
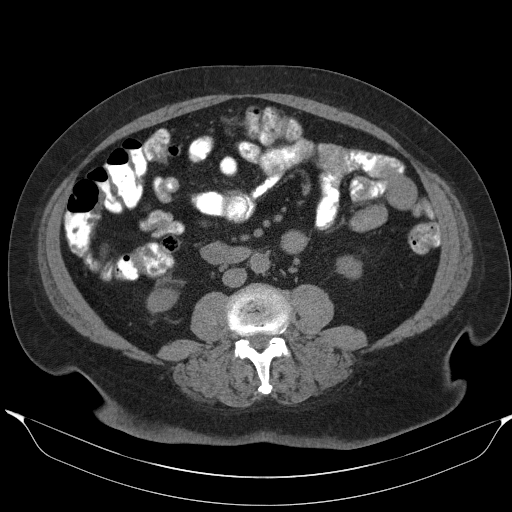
[im 16/72  soft-tissue]
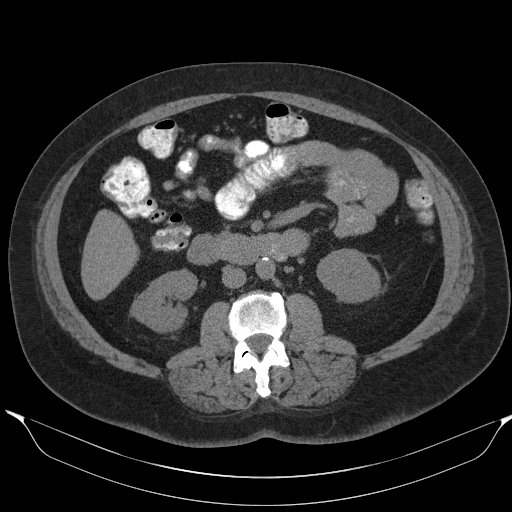
[im 20/72  soft-tissue]
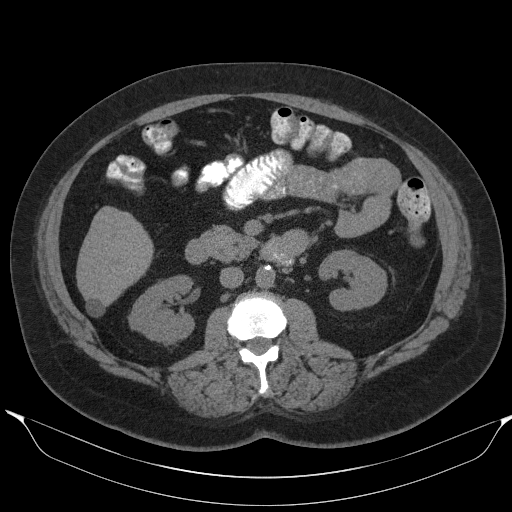
[im 24/72  soft-tissue]
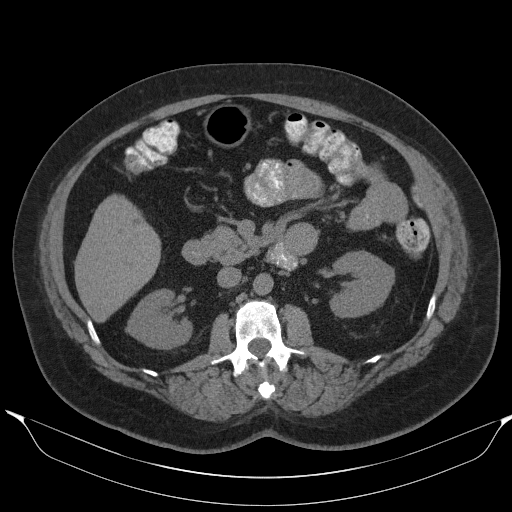
[im 32/72  soft-tissue]
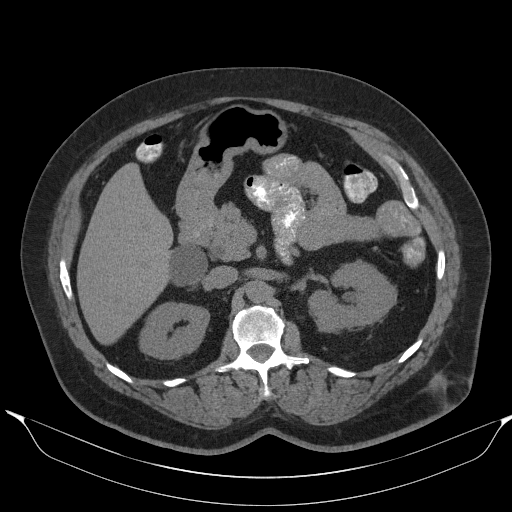
[im 36/72  soft-tissue]
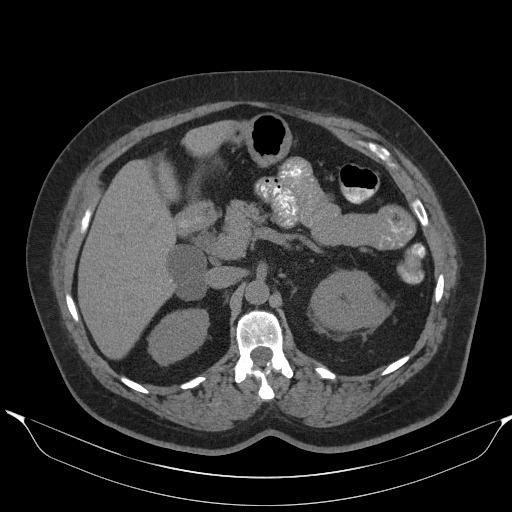
[im 40/72  soft-tissue]
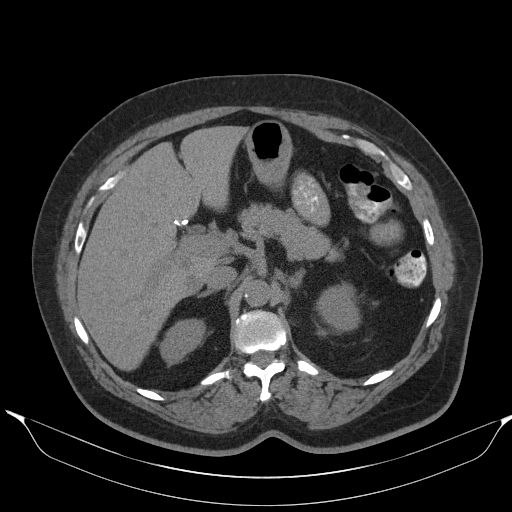
[im 48/72  soft-tissue]
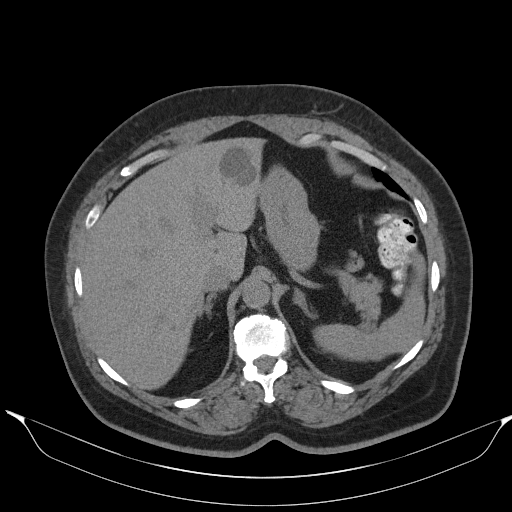
[im 48/72  bone]
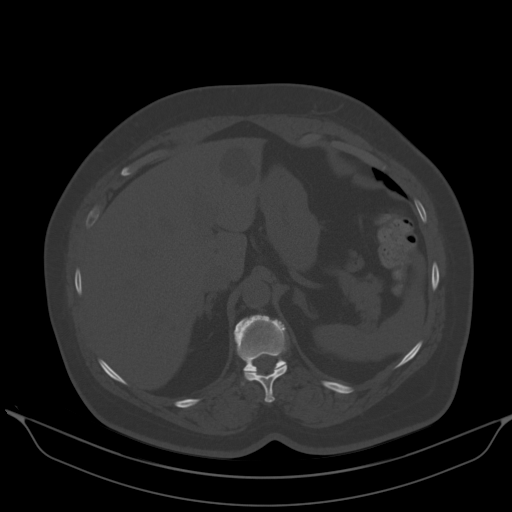
[im 52/72  soft-tissue]
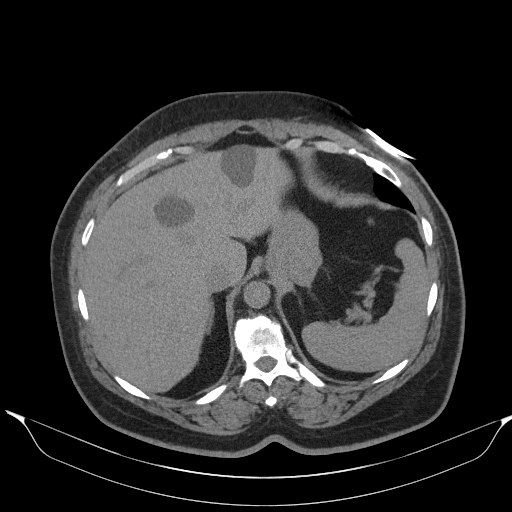
[im 56/72  soft-tissue]
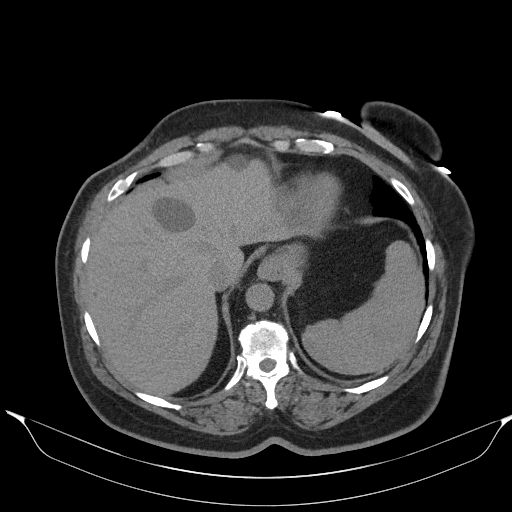
[im 56/72  lung]
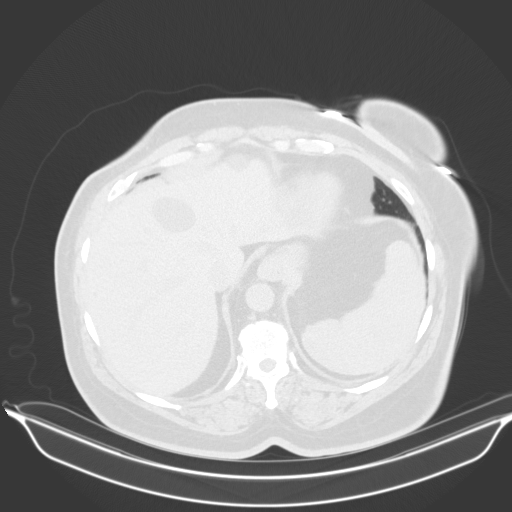
[im 60/72  lung]
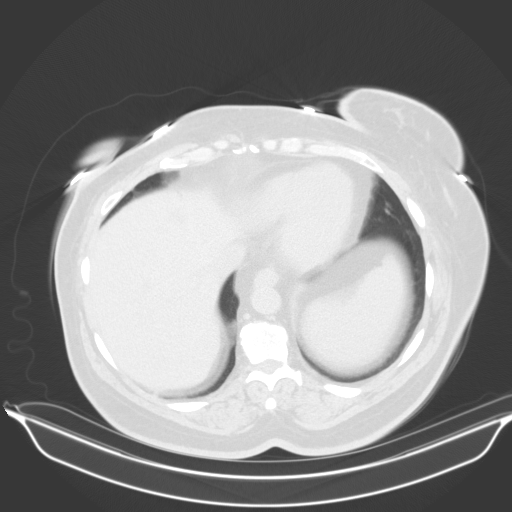
[im 64/72  soft-tissue]
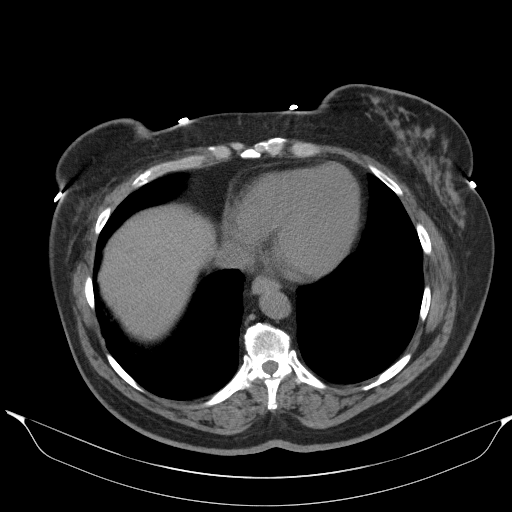
[im 64/72  lung]
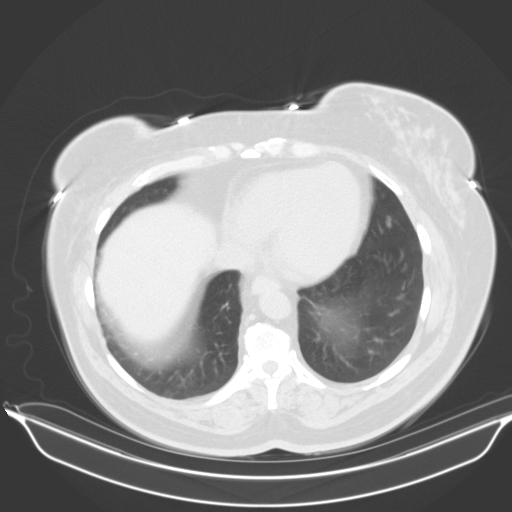
[im 68/72  soft-tissue]
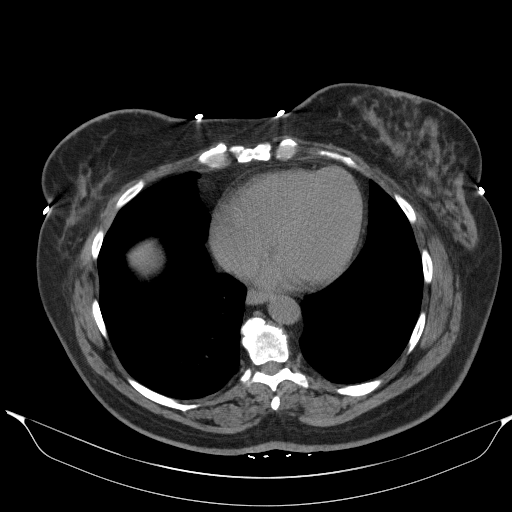
[im 68/72  lung]
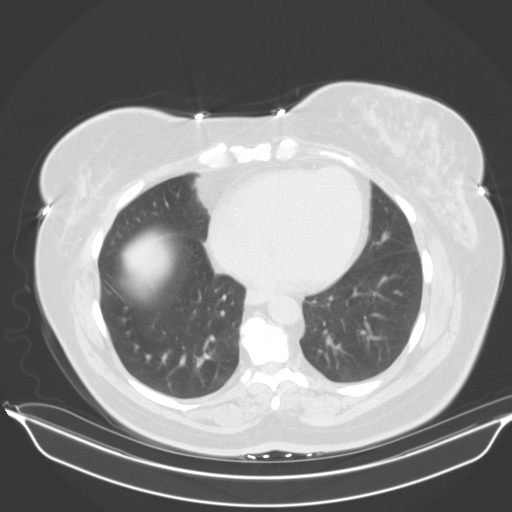

[14 of 32 positions shown; findings below may reference images not displayed]

FINDINGS: Lower chest: No acute findings.

Hepatobiliary: Several fluid attenuation cysts are again seen in
both the right and left hepatic lobes. Prior cholecystectomy. No
evidence of biliary obstruction. No mass visualized on this
unenhanced exam.

Pancreas: No mass or inflammatory process visualized on this
unenhanced exam.

Spleen:  Within normal limits in size.

Adrenals/Urinary tract: No evidence of urolithiasis or
hydronephrosis. 2 cm left adrenal mass is again seen. When today's
noncontrast attenuation is correlated with post contrast attenuation
values from previous exam, the contrast washout is consistent with a
benign adrenal adenoma.

Stomach/Bowel: No evidence of obstruction, inflammatory process, or
abnormal fluid collections.

Vascular/Lymphatic: No pathologically enlarged lymph nodes
identified. No evidence of abdominal aortic aneurysm. Aortic
atherosclerosis.

Other:  None.

Musculoskeletal:  No suspicious bone lesions identified.
IMPRESSION: 2 cm left adrenal mass is consistent with a benign adenoma. No
significant abnormality identified.

## 2018-10-11 NOTE — Progress Notes (Signed)
Patient Care Team: [name], MD as PCP - General (Family Medicine)  DIAGNOSIS:    ICD-10-CM   1. Malignant neoplasm of upper-outer quadrant of right breast in female, estrogen receptor positive ([place]) C50.411 LORazepam (ATIVAN) 0.5 MG tablet   Z17.0     SUMMARY OF ONCOLOGIC HISTORY:   Malignant neoplasm of upper-outer quadrant of right breast in female, estrogen receptor positive ([facility])   11/12/2017 Initial Diagnosis    Palpable lesion in the right breast upper outer quadrant mammogram and ultrasound revealed 1.4 cm tumor ultrasound-guided biopsy revealed grade 2 IDC ER 3+, PR 3+, HER-2 negative with a Ki-67 of 10%.  Right axillary lymph node biopsy positive, T1cN1 stage I a clinical stage    12/23/2017 Cancer Staging    Staging form: Breast, AJCC 8th Edition - Clinical: Stage IB (cT1c, cN1, cM0, G2, ER+, PR+, HER2-) - Signed by Gardenia Phlegm, NP on 12/23/2017    01/10/2018 Breast MRI    Biopsy-proven malignancy UOQ right breast measuring 6 cm.  Anterior inferior to this is a 1 cm mass which needs biopsy.  Focal non-mass enhancement LOQ retroareolar right breast, left breast 7 mm irregular enhancement, 2 suspicious right axillary lymph nodes    01/27/2018 Procedure    Right breast biopsy UOQ: Grade 1 ILC, ALH Right breast biopsy 6:00: LCIS Left breast biopsy: Benign    06/21/2018 Surgery    Rt Mastectomy: 7 cm fibrotic mass ILC Grade 2, 5/6 LN Positive, ER 90-100%, PR 5%-70%, Ki 67 2-10%, Her 2 Neg, T3N2    07/13/2018 Surgery    Right axillary lymph node dissection: 3/10 lymph nodes positive making total number of lymph nodes 8/16    08/10/2018 -  Chemotherapy    Adjuvant chemotherapy with dose dense Adriamycin and Cytoxan x4 followed by Taxol weekly x12     CHIEF COMPLIANT: Cycle 1 Taxol  INTERVAL HISTORY: Charlene Perry is a 68 y.o. with above-mentioned history of right breast cancer who underwent a right mastectomyand completed 4 cycles of  adjuvant chemotherapy with dose dense Adriamycin and Cytoxan. Weekly Taxol treatments were to begin last week but were postponed due to low WBC (0.6). She presents to the clinic alone today for cycle 1 of Taxol.   REVIEW OF SYSTEMS:   Constitutional: Denies fevers, chills or abnormal weight loss Eyes: Denies blurriness of vision Ears, nose, mouth, throat, and face: Denies mucositis or sore throat Respiratory: Denies cough, dyspnea or wheezes Cardiovascular: Denies palpitation, chest discomfort Gastrointestinal: Denies nausea, heartburn or change in bowel habits Skin: Denies abnormal skin rashes Lymphatics: Denies new lymphadenopathy or easy bruising Neurological: Denies numbness, tingling or new weaknesses Behavioral/Psych: Mood is stable, no new changes  Extremities: No lower extremity edema Breast: denies any pain or lumps or nodules in either breasts All other systems were reviewed with the patient and are negative.  I have reviewed the past medical history, past surgical history, social history and family history with the patient and they are unchanged from previous note.  ALLERGIES:  has No Known Allergies.  MEDICATIONS:  Current Outpatient Medications  Medication Sig Dispense Refill  . aspirin EC 81 MG tablet Take 81 mg by mouth daily.    . celecoxib (CELEBREX) 200 MG capsule Take 200 mg by mouth daily.    . cholecalciferol (VITAMIN D3) 25 MCG (1000 UT) tablet Take 1 tablet (1,000 Units total) by mouth daily.    . furosemide (LASIX) 40 MG tablet Take 80 mg by mouth daily.    Marland Kitchen  gabapentin (NEURONTIN) 400 MG capsule Take 400 mg by mouth 2 (two) times daily.    Marland Kitchen levothyroxine (SYNTHROID, LEVOTHROID) 125 MCG tablet Take 125 mcg by mouth daily before breakfast.    . lidocaine-prilocaine (EMLA) cream Apply to affected area once 30 g 3  . LORazepam (ATIVAN) 0.5 MG tablet Take 1 tablet (0.5 mg total) by mouth at bedtime as needed (Nausea or vomiting). 30 tablet 0  . metFORMIN  (GLUCOPHAGE) 1000 MG tablet Take 1,000 mg by mouth 2 (two) times daily with a meal.    . ondansetron (ZOFRAN) 8 MG tablet Take 1 tablet (8 mg total) by mouth 2 (two) times daily as needed. Start on the third day after chemotherapy. 30 tablet 1  . pantoprazole (PROTONIX) 40 MG tablet Take 1 tablet (40 mg total) by mouth daily. 30 tablet 1  . potassium chloride (K-DUR,KLOR-CON) 10 MEQ tablet Take 20 mEq by mouth daily.     . prochlorperazine (COMPAZINE) 10 MG tablet Take 1 tablet (10 mg total) by mouth every 6 (six) hours as needed (Nausea or vomiting). 30 tablet 1  . rosuvastatin (CRESTOR) 40 MG tablet Take 40 mg by mouth at bedtime.      No current facility-administered medications for this visit.     PHYSICAL EXAMINATION: ECOG PERFORMANCE STATUS: 1 - Symptomatic but completely ambulatory  Vitals:   10/12/18 0918  BP: (!) 144/75  Pulse: 100  Resp: 17  Temp: 98.6 F (37 C)  SpO2: 99%   Filed Weights   10/12/18 0918  Weight: 209 lb 1.6 oz (94.8 kg)    GENERAL: alert, no distress and comfortable SKIN: skin color, texture, turgor are normal, no rashes or significant lesions EYES: normal, Conjunctiva are pink and non-injected, sclera clear OROPHARYNX: no exudate, no erythema and lips, buccal mucosa, and tongue normal  NECK: supple, thyroid normal size, non-tender, without nodularity LYMPH: no palpable lymphadenopathy in the cervical, axillary or inguinal LUNGS: clear to auscultation and percussion with normal breathing effort HEART: regular rate & rhythm and no murmurs and no lower extremity edema ABDOMEN: abdomen soft, non-tender and normal bowel sounds MUSCULOSKELETAL: no cyanosis of digits and no clubbing  NEURO: alert & oriented x 3 with fluent speech, no focal motor/sensory deficits EXTREMITIES: No lower extremity edema  LABORATORY DATA:  I have reviewed the data as listed CMP Latest Ref Rng & Units 10/06/2018 09/21/2018 09/07/2018  Glucose 70 - 99 mg/dL 106(H) 101(H) 207(H)   BUN 8 - 23 mg/dL '16 11 12  '$ Creatinine 0.44 - 1.00 mg/dL 1.02(H) 0.92 0.97  Sodium 135 - 145 mmol/L 142 145 143  Potassium 3.5 - 5.1 mmol/L 3.7 3.6 4.0  Chloride 98 - 111 mmol/L 105 107 104  CO2 22 - 32 mmol/L '27 28 25  '$ Calcium 8.9 - 10.3 mg/dL 9.0 8.8(L) 8.9  Total Protein 6.5 - 8.1 g/dL 6.9 6.7 6.8  Total Bilirubin 0.3 - 1.2 mg/dL 0.4 0.4 0.3  Alkaline Phos 38 - 126 U/L 93 99 123  AST 15 - 41 U/L '21 16 15  '$ ALT 0 - 44 U/L '23 18 17    '$ Lab Results  Component Value Date   WBC 2.1 (L) 10/12/2018   HGB 9.4 (L) 10/12/2018   HCT 29.3 (L) 10/12/2018   MCV 89.3 10/12/2018   PLT 370 10/12/2018   NEUTROABS 1.1 (L) 10/12/2018    ASSESSMENT & PLAN:  Malignant neoplasm of upper-outer quadrant of right breast in female, estrogen receptor positive ([place]) 06/21/17:Rt Mastectomy: 7 cm fibrotic mass  ILC Grade 2, 5/6 LN Positive, ER 90-100%, PR 5%-70%, Ki 67 2-10%, Her 2 Neg, T3N2 Treatment plan: 1. Adj chemo 2. Adj RT 3. Adj Anti estrogen therapy  CT CAP: 07/02/2018: Subtle hypoattenuating lesion upper pole left kidney could be a cyst, urology consulted, left adrenal adenoma. --------------------------------------------------------------------------------------------------------------------- Current treatment: Completed 4 cycles ofdose dense Adriamycin and Cytoxan, Today is cycle 1 Taxol (last week treatment was held because of low white blood cell count) Labs reviewed Echocardiogram: 07/02/2018: EF 60 to 65%  Chemo toxicities: 1.Diarrhea, minimal 2.Mild nausea-controlled with anti emetics 3.Mild fatigue 4.  Leukopenia: Today's ANC is 1.1.  We will initiate her first cycle of Taxol today with a lower dosage of 50 mg/m.  If her counts recover we will go up on the dose with the next cycle. 5.  Chemotherapy-induced anemia: Monitoring closely.  Return to clinic in 1 week for cycle 2 Taxol.    No orders of the defined types were placed in this encounter.  The patient has a good  understanding of the overall plan. she agrees with it. she will call with any problems that may develop before the next visit here.  [name], MD 10/12/2018  Julious Oka Dorshimer am acting as scribe for Dr. [name].  I have reviewed the above documentation for accuracy and completeness, and I agree with the above.

## 2018-10-12 ENCOUNTER — Inpatient Hospital Stay (HOSPITAL_BASED_OUTPATIENT_CLINIC_OR_DEPARTMENT_OTHER): Payer: BLUE CROSS/BLUE SHIELD | Admitting: Hematology and Oncology

## 2018-10-12 ENCOUNTER — Inpatient Hospital Stay: Payer: BLUE CROSS/BLUE SHIELD

## 2018-10-12 ENCOUNTER — Other Ambulatory Visit: Payer: BLUE CROSS/BLUE SHIELD

## 2018-10-12 ENCOUNTER — Other Ambulatory Visit: Payer: Self-pay

## 2018-10-12 VITALS — BP 127/69 | HR 100 | Temp 99.0°F | Resp 20

## 2018-10-12 DIAGNOSIS — C50411 Malignant neoplasm of upper-outer quadrant of right female breast: Secondary | ICD-10-CM

## 2018-10-12 DIAGNOSIS — Z8249 Family history of ischemic heart disease and other diseases of the circulatory system: Secondary | ICD-10-CM

## 2018-10-12 DIAGNOSIS — Z170 Estrogen receptor positive status [ER+]: Principal | ICD-10-CM

## 2018-10-12 DIAGNOSIS — C773 Secondary and unspecified malignant neoplasm of axilla and upper limb lymph nodes: Secondary | ICD-10-CM | POA: Diagnosis not present

## 2018-10-12 DIAGNOSIS — R5383 Other fatigue: Secondary | ICD-10-CM

## 2018-10-12 DIAGNOSIS — Z791 Long term (current) use of non-steroidal anti-inflammatories (NSAID): Secondary | ICD-10-CM

## 2018-10-12 DIAGNOSIS — Z79899 Other long term (current) drug therapy: Secondary | ICD-10-CM

## 2018-10-12 DIAGNOSIS — Z803 Family history of malignant neoplasm of breast: Secondary | ICD-10-CM

## 2018-10-12 DIAGNOSIS — Z5111 Encounter for antineoplastic chemotherapy: Secondary | ICD-10-CM | POA: Diagnosis not present

## 2018-10-12 DIAGNOSIS — C50011 Malignant neoplasm of nipple and areola, right female breast: Secondary | ICD-10-CM

## 2018-10-12 DIAGNOSIS — Z7984 Long term (current) use of oral hypoglycemic drugs: Secondary | ICD-10-CM

## 2018-10-12 DIAGNOSIS — R110 Nausea: Secondary | ICD-10-CM

## 2018-10-12 DIAGNOSIS — E119 Type 2 diabetes mellitus without complications: Secondary | ICD-10-CM

## 2018-10-12 DIAGNOSIS — D72819 Decreased white blood cell count, unspecified: Secondary | ICD-10-CM | POA: Diagnosis not present

## 2018-10-12 DIAGNOSIS — Z800 Family history of malignant neoplasm of digestive organs: Secondary | ICD-10-CM

## 2018-10-12 DIAGNOSIS — Z95828 Presence of other vascular implants and grafts: Secondary | ICD-10-CM

## 2018-10-12 DIAGNOSIS — Z7982 Long term (current) use of aspirin: Secondary | ICD-10-CM

## 2018-10-12 LAB — CMP (CANCER CENTER ONLY)
ALT: 21 U/L (ref 0–44)
AST: 17 U/L (ref 15–41)
Albumin: 3.7 g/dL (ref 3.5–5.0)
Alkaline Phosphatase: 79 U/L (ref 38–126)
Anion gap: 11 (ref 5–15)
BUN: 12 mg/dL (ref 8–23)
CO2: 27 mmol/L (ref 22–32)
Calcium: 8.7 mg/dL — ABNORMAL LOW (ref 8.9–10.3)
Chloride: 107 mmol/L (ref 98–111)
Creatinine: 0.86 mg/dL (ref 0.44–1.00)
GFR, Est AFR Am: >60 mL/min (ref >60)
GFR, Estimated: >60 mL/min (ref >60)
Glucose, Bld: 108 mg/dL — ABNORMAL HIGH (ref 70–99)
Potassium: 3.9 mmol/L (ref 3.5–5.1)
Sodium: 145 mmol/L (ref 135–145)
Total Bilirubin: 0.2 mg/dL — ABNORMAL LOW (ref 0.3–1.2)
Total Protein: 6.7 g/dL (ref 6.5–8.1)

## 2018-10-12 LAB — CBC WITH DIFFERENTIAL (CANCER CENTER ONLY)
Abs Immature Granulocytes: 0.04 10*3/uL (ref 0.00–0.07)
Basophils Absolute: 0 10*3/uL (ref 0.0–0.1)
Basophils Relative: 1 %
Eosinophils Absolute: 0 10*3/uL (ref 0.0–0.5)
Eosinophils Relative: 0 %
HCT: 29.3 % — ABNORMAL LOW (ref 36.0–46.0)
Hemoglobin: 9.4 g/dL — ABNORMAL LOW (ref 12.0–15.0)
Immature Granulocytes: 2 %
Lymphocytes Relative: 23 %
Lymphs Abs: 0.5 10*3/uL — ABNORMAL LOW (ref 0.7–4.0)
MCH: 28.7 pg (ref 26.0–34.0)
MCHC: 32.1 g/dL (ref 30.0–36.0)
MCV: 89.3 fL (ref 80.0–100.0)
Monocytes Absolute: 0.5 10*3/uL (ref 0.1–1.0)
Monocytes Relative: 24 %
Neutro Abs: 1.1 10*3/uL — ABNORMAL LOW (ref 1.7–7.7)
Neutrophils Relative %: 50 %
Platelet Count: 370 10*3/uL (ref 150–400)
RBC: 3.28 MIL/uL — ABNORMAL LOW (ref 3.87–5.11)
RDW: 19.1 % — ABNORMAL HIGH (ref 11.5–15.5)
WBC Count: 2.1 10*3/uL — ABNORMAL LOW (ref 4.0–10.5)
nRBC: 0 % (ref 0.0–0.2)

## 2018-10-12 MED ORDER — DEXAMETHASONE SODIUM PHOSPHATE 10 MG/ML IJ SOLN
INTRAMUSCULAR | Status: AC
  Filled 2018-10-12: qty 1

## 2018-10-12 MED ORDER — HEPARIN SOD (PORK) LOCK FLUSH 100 UNIT/ML IV SOLN
500.0000 [IU] | Freq: Once | INTRAVENOUS | Status: AC | PRN
  Administered 2018-10-12: 13:00:00 500 [IU]
  Filled 2018-10-12: qty 5

## 2018-10-12 MED ORDER — SODIUM CHLORIDE 0.9% FLUSH
10.0000 mL | Freq: Once | INTRAVENOUS | Status: AC
  Administered 2018-10-12: 10 mL
  Filled 2018-10-12: qty 10

## 2018-10-12 MED ORDER — LORAZEPAM 0.5 MG PO TABS: 0.5000 mg | ORAL_TABLET | Freq: Every evening | ORAL | 0 refills | Status: DC | PRN

## 2018-10-12 MED ORDER — FAMOTIDINE IN NACL 20-0.9 MG/50ML-% IV SOLN
INTRAVENOUS | Status: AC
  Filled 2018-10-12: qty 50

## 2018-10-12 MED ORDER — DIPHENHYDRAMINE HCL 50 MG/ML IJ SOLN
50.0000 mg | Freq: Once | INTRAMUSCULAR | Status: AC
  Administered 2018-10-12: 10:00:00 50 mg via INTRAVENOUS

## 2018-10-12 MED ORDER — SODIUM CHLORIDE 0.9 % IV SOLN
Freq: Once | INTRAVENOUS | Status: AC
  Administered 2018-10-12: 10:00:00 via INTRAVENOUS
  Filled 2018-10-12: qty 250

## 2018-10-12 MED ORDER — DIPHENHYDRAMINE HCL 50 MG/ML IJ SOLN
INTRAMUSCULAR | Status: AC
  Filled 2018-10-12: qty 1

## 2018-10-12 MED ORDER — FAMOTIDINE IN NACL 20-0.9 MG/50ML-% IV SOLN
20.0000 mg | Freq: Once | INTRAVENOUS | Status: AC
  Administered 2018-10-12: 20 mg via INTRAVENOUS

## 2018-10-12 MED ORDER — SODIUM CHLORIDE 0.9 % IV SOLN: 10.0000 mg | Freq: Once | INTRAVENOUS | Status: DC

## 2018-10-12 MED ORDER — SODIUM CHLORIDE 0.9 % IV SOLN
50.0000 mg/m2 | Freq: Once | INTRAVENOUS | Status: AC
  Administered 2018-10-12: 108 mg via INTRAVENOUS
  Filled 2018-10-12: qty 18

## 2018-10-12 MED ORDER — SODIUM CHLORIDE 0.9% FLUSH
10.0000 mL | INTRAVENOUS | Status: DC | PRN
  Administered 2018-10-12: 13:00:00 10 mL
  Filled 2018-10-12: qty 10

## 2018-10-12 MED ORDER — DEXAMETHASONE SODIUM PHOSPHATE 10 MG/ML IJ SOLN
10.0000 mg | Freq: Once | INTRAMUSCULAR | Status: AC
  Administered 2018-10-12: 10 mg via INTRAVENOUS

## 2018-10-12 NOTE — Patient Instructions (Signed)
[facility] Discharge Instructions for Patients Receiving Chemotherapy  Today you received the following chemotherapy agents Paclitaxel (TAXOL).  To help prevent nausea and vomiting after your treatment, we encourage you to take your nausea medication as prescribed.   If you develop nausea and vomiting that is not controlled by your nausea medication, call the clinic.   BELOW ARE SYMPTOMS THAT SHOULD BE REPORTED IMMEDIATELY:  *FEVER GREATER THAN 100.5 F  *CHILLS WITH OR WITHOUT FEVER  NAUSEA AND VOMITING THAT IS NOT CONTROLLED WITH YOUR NAUSEA MEDICATION  *UNUSUAL SHORTNESS OF BREATH  *UNUSUAL BRUISING OR BLEEDING  TENDERNESS IN MOUTH AND THROAT WITH OR WITHOUT PRESENCE OF ULCERS  *URINARY PROBLEMS  *BOWEL PROBLEMS  UNUSUAL RASH Items with * indicate a potential emergency and should be followed up as soon as possible.  Feel free to call the clinic should you have any questions or concerns. The clinic phone number is [phone].  Please show the [facility] at check-in to the Emergency Department and triage nurse.  Paclitaxel injection What is this medicine? PACLITAXEL (PAK li TAX el) is a chemotherapy drug. It targets fast dividing cells, like cancer cells, and causes these cells to die. This medicine is used to treat ovarian cancer, breast cancer, lung cancer, Kaposi's sarcoma, and other cancers. This medicine may be used for other purposes; ask your health care provider or pharmacist if you have questions. COMMON BRAND NAME(S): Onxol, Taxol What should I tell my health care provider before I take this medicine? They need to know if you have any of these conditions: -history of irregular heartbeat -liver disease -low blood counts, like low white cell, platelet, or red cell counts -lung or breathing disease, like asthma -tingling of the fingers or toes, or other nerve disorder -an unusual or allergic reaction to paclitaxel, alcohol,  polyoxyethylated castor oil, other chemotherapy, other medicines, foods, dyes, or preservatives -pregnant or trying to get pregnant -breast-feeding How should I use this medicine? This drug is given as an infusion into a vein. It is administered in a hospital or clinic by a specially trained health care professional. Talk to your pediatrician regarding the use of this medicine in children. Special care may be needed. Overdosage: If you think you have taken too much of this medicine contact a poison control center or emergency room at once. NOTE: This medicine is only for you. Do not share this medicine with others. What if I miss a dose? It is important not to miss your dose. Call your doctor or health care professional if you are unable to keep an appointment. What may interact with this medicine? Do not take this medicine with any of the following medications: -disulfiram -metronidazole This medicine may also interact with the following medications: -antiviral medicines for hepatitis, HIV or AIDS -certain antibiotics like erythromycin and clarithromycin -certain medicines for fungal infections like ketoconazole and itraconazole -certain medicines for seizures like carbamazepine, phenobarbital, phenytoin -gemfibrozil -nefazodone -rifampin -St. John's wort This list may not describe all possible interactions. Give your health care provider a list of all the medicines, herbs, non-prescription drugs, or dietary supplements you use. Also tell them if you smoke, drink alcohol, or use illegal drugs. Some items may interact with your medicine. What should I watch for while using this medicine? Your condition will be monitored carefully while you are receiving this medicine. You will need important blood work done while you are taking this medicine. This medicine can cause serious allergic reactions. To reduce your  risk you will need to take other medicine(s) before treatment with this medicine.  If you experience allergic reactions like skin rash, itching or hives, swelling of the face, lips, or tongue, tell your doctor or health care professional right away. In some cases, you may be given additional medicines to help with side effects. Follow all directions for their use. This drug may make you feel generally unwell. This is not uncommon, as chemotherapy can affect healthy cells as well as cancer cells. Report any side effects. Continue your course of treatment even though you feel ill unless your doctor tells you to stop. Call your doctor or health care professional for advice if you get a fever, chills or sore throat, or other symptoms of a cold or flu. Do not treat yourself. This drug decreases your body's ability to fight infections. Try to avoid being around people who are sick. This medicine may increase your risk to bruise or bleed. Call your doctor or health care professional if you notice any unusual bleeding. Be careful brushing and flossing your teeth or using a toothpick because you may get an infection or bleed more easily. If you have any dental work done, tell your dentist you are receiving this medicine. Avoid taking products that contain aspirin, acetaminophen, ibuprofen, naproxen, or ketoprofen unless instructed by your doctor. These medicines may hide a fever. Do not become pregnant while taking this medicine. Women should inform their doctor if they wish to become pregnant or think they might be pregnant. There is a potential for serious side effects to an unborn child. Talk to your health care professional or pharmacist for more information. Do not breast-feed an infant while taking this medicine. Men are advised not to father a child while receiving this medicine. This product may contain alcohol. Ask your pharmacist or healthcare provider if this medicine contains alcohol. Be sure to tell all healthcare providers you are taking this medicine. Certain medicines, like  metronidazole and disulfiram, can cause an unpleasant reaction when taken with alcohol. The reaction includes flushing, headache, nausea, vomiting, sweating, and increased thirst. The reaction can last from 30 minutes to several hours. What side effects may I notice from receiving this medicine? Side effects that you should report to your doctor or health care professional as soon as possible: -allergic reactions like skin rash, itching or hives, swelling of the face, lips, or tongue -breathing problems -changes in vision -fast, irregular heartbeat -high or low blood pressure -mouth sores -pain, tingling, numbness in the hands or feet -signs of decreased platelets or bleeding - bruising, pinpoint red spots on the skin, black, tarry stools, blood in the urine -signs of decreased red blood cells - unusually weak or tired, feeling faint or lightheaded, falls -signs of infection - fever or chills, cough, sore throat, pain or difficulty passing urine -signs and symptoms of liver injury like dark yellow or brown urine; general ill feeling or flu-like symptoms; light-colored stools; loss of appetite; nausea; right upper belly pain; unusually weak or tired; yellowing of the eyes or skin -swelling of the ankles, feet, hands -unusually slow heartbeat Side effects that usually do not require medical attention (report to your doctor or health care professional if they continue or are bothersome): -diarrhea -hair loss -loss of appetite -muscle or joint pain -nausea, vomiting -pain, redness, or irritation at site where injected -tiredness This list may not describe all possible side effects. Call your doctor for medical advice about side effects. You may report side effects to  FDA at 1-800-FDA-1088. Where should I keep my medicine? This drug is given in a hospital or clinic and will not be stored at home. NOTE: This sheet is a summary. It may not cover all possible information. If you have questions  about this medicine, talk to your doctor, pharmacist, or health care provider.  2019 Elsevier/Gold Standard (2017-02-03 13:14:55)  Coronavirus (COVID-19) Are you at risk?  Are you at risk for the Coronavirus (COVID-19)?  To be considered HIGH RISK for Coronavirus (COVID-19), you have to meet the following criteria:  . Traveled to [place], [place], [place], [place] or [place]; or in the [place] to [place], [place], [place], or [place]; and have fever, cough, and shortness of breath within the last 2 weeks of travel OR . Been in close contact with a person diagnosed with COVID-19 within the last 2 weeks and have fever, cough, and shortness of breath . IF YOU DO NOT MEET THESE CRITERIA, YOU ARE CONSIDERED LOW RISK FOR COVID-19.  What to do if you are HIGH RISK for COVID-19?  Marland Kitchen If you are having a medical emergency, call 911. . Seek medical care right away. Before you go to a doctor's office, urgent care or emergency department, call ahead and tell them about your recent travel, contact with someone diagnosed with COVID-19, and your symptoms. You should receive instructions from your physician's office regarding next steps of care.  . When you arrive at healthcare provider, tell the healthcare staff immediately you have returned from visiting [place], [place], [place], [place] or [place]; or traveled in the [place] to [place], [place], [place], or [place]; in the last two weeks or you have been in close contact with a person diagnosed with COVID-19 in the last 2 weeks.   . Tell the health care staff about your symptoms: fever, cough and shortness of breath. . After you have been seen by a medical provider, you will be either: o Tested for (COVID-19) and discharged home on quarantine except to seek medical care if symptoms worsen, and asked to  - Stay home and avoid contact with others until you get your results (4-5 days)  - Avoid travel on public transportation if possible  (such as bus, train, or airplane) or o Sent to the Emergency Department by EMS for evaluation, COVID-19 testing, and possible admission depending on your condition and test results.  What to do if you are LOW RISK for COVID-19?  Reduce your risk of any infection by using the same precautions used for avoiding the common cold or flu:  Marland Kitchen Wash your hands often with soap and warm water for at least 20 seconds.  If soap and water are not readily available, use an alcohol-based hand sanitizer with at least 60% alcohol.  . If coughing or sneezing, cover your mouth and nose by coughing or sneezing into the elbow areas of your shirt or coat, into a tissue or into your sleeve (not your hands). . Avoid shaking hands with others and consider head nods or verbal greetings only. . Avoid touching your eyes, nose, or mouth with unwashed hands.  . Avoid close contact with people who are sick. . Avoid places or events with large numbers of people in one location, like concerts or sporting events. . Carefully consider travel plans you have or are making. . If you are planning any travel outside or inside the [place], visit the CDC's Travelers' Health webpage for the latest health notices. Marland Kitchen  If you have some symptoms but not all symptoms, continue to monitor at home and seek medical attention if your symptoms worsen. . If you are having a medical emergency, call 911.   [facility] / e-Visit: [email or web address]         [facility]: [facility]: [phone]                   [facility]: [phone]

## 2018-10-12 NOTE — Assessment & Plan Note (Signed)
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 Adj chemo 2. Adj RT 3. Adj Anti estrogen therapy  CT CAP: 07/02/2018: Subtle hypoattenuating lesion upper pole left kidney could be a cyst, urology consulted, left adrenal adenoma. --------------------------------------------------------------------------------------------------------------------- Current treatment: Completed 4 cycles ofdose dense Adriamycin and Cytoxan, Today is cycle 1 Taxol (last week treatment was held because of low white blood cell count) Labs reviewed Echocardiogram: 07/02/2018: EF 60 to 65%  Chemo toxicities: 1.Diarrhea, minimal 2.Mild nausea-controlled with anti emetics 3.Mild fatigue 4.  Leukopenia: Because of white count of 0.6, we are canceling today's chemotherapy.  She will come back next week and we will reassess. I instructed her about avoiding outside exposure.  Return to clinic in 1 week for cycle 2 Taxol.

## 2018-10-12 NOTE — Patient Instructions (Signed)

## 2018-10-13 ENCOUNTER — Telehealth: Payer: Self-pay | Admitting: *Deleted

## 2018-10-13 NOTE — Telephone Encounter (Signed)
Called pt to see how she was feeling after her first dose of Taxol yesterday.  Pt states she is asymptomatic.  I reinforced education on drinking plenty of fluids and to monitor her temperature.  I also educated pt to call us if she does start to experience any symptoms such as severe nausea, mouth sores, diarrhea, constipation, or anything not normal for her.  Pt verbalized understanding and appreciative of call.

## 2018-10-13 NOTE — Telephone Encounter (Signed)
Attempt x1 to follow up with pt to see how she is doing post 1st taxol treatment yesterday.  No answer, LVM, will attempt later today.

## 2018-10-15 ENCOUNTER — Telehealth: Payer: Self-pay

## 2018-10-15 NOTE — Telephone Encounter (Signed)
Nurse placed call to follow up after first time chemotherapy with Taxol.   Pt denies fever, nausea, vomiting, or diarrhea.  Reports extreme fatigue.  Patient is s/p Taxol day 3, nurse educated on expected fatigue.  Pt voiced understanding.  All questions were answered, no further needs.

## 2018-10-19 ENCOUNTER — Inpatient Hospital Stay: Payer: BLUE CROSS/BLUE SHIELD

## 2018-10-19 ENCOUNTER — Inpatient Hospital Stay: Payer: BLUE CROSS/BLUE SHIELD | Attending: Hematology and Oncology

## 2018-10-19 ENCOUNTER — Other Ambulatory Visit: Payer: Self-pay

## 2018-10-19 ENCOUNTER — Encounter: Payer: Self-pay | Admitting: Hematology and Oncology

## 2018-10-19 ENCOUNTER — Inpatient Hospital Stay (HOSPITAL_BASED_OUTPATIENT_CLINIC_OR_DEPARTMENT_OTHER): Payer: BLUE CROSS/BLUE SHIELD | Admitting: Adult Health

## 2018-10-19 ENCOUNTER — Encounter: Payer: Self-pay | Admitting: Adult Health

## 2018-10-19 VITALS — HR 97

## 2018-10-19 VITALS — BP 141/82 | HR 101 | Temp 97.9°F | Resp 17 | Ht 66.0 in | Wt 214.3 lb

## 2018-10-19 DIAGNOSIS — Z5111 Encounter for antineoplastic chemotherapy: Secondary | ICD-10-CM | POA: Diagnosis present

## 2018-10-19 DIAGNOSIS — E119 Type 2 diabetes mellitus without complications: Secondary | ICD-10-CM | POA: Insufficient documentation

## 2018-10-19 DIAGNOSIS — K1230 Oral mucositis (ulcerative), unspecified: Secondary | ICD-10-CM | POA: Insufficient documentation

## 2018-10-19 DIAGNOSIS — Z7984 Long term (current) use of oral hypoglycemic drugs: Secondary | ICD-10-CM | POA: Insufficient documentation

## 2018-10-19 DIAGNOSIS — D701 Agranulocytosis secondary to cancer chemotherapy: Secondary | ICD-10-CM

## 2018-10-19 DIAGNOSIS — Z79899 Other long term (current) drug therapy: Secondary | ICD-10-CM | POA: Diagnosis not present

## 2018-10-19 DIAGNOSIS — Z5189 Encounter for other specified aftercare: Secondary | ICD-10-CM | POA: Insufficient documentation

## 2018-10-19 DIAGNOSIS — Z95828 Presence of other vascular implants and grafts: Secondary | ICD-10-CM

## 2018-10-19 DIAGNOSIS — R21 Rash and other nonspecific skin eruption: Secondary | ICD-10-CM | POA: Diagnosis not present

## 2018-10-19 DIAGNOSIS — Z170 Estrogen receptor positive status [ER+]: Secondary | ICD-10-CM

## 2018-10-19 DIAGNOSIS — C50411 Malignant neoplasm of upper-outer quadrant of right female breast: Secondary | ICD-10-CM

## 2018-10-19 DIAGNOSIS — C50011 Malignant neoplasm of nipple and areola, right female breast: Secondary | ICD-10-CM

## 2018-10-19 DIAGNOSIS — Z8249 Family history of ischemic heart disease and other diseases of the circulatory system: Secondary | ICD-10-CM

## 2018-10-19 DIAGNOSIS — Z800 Family history of malignant neoplasm of digestive organs: Secondary | ICD-10-CM | POA: Diagnosis not present

## 2018-10-19 DIAGNOSIS — Z803 Family history of malignant neoplasm of breast: Secondary | ICD-10-CM

## 2018-10-19 DIAGNOSIS — R252 Cramp and spasm: Secondary | ICD-10-CM | POA: Insufficient documentation

## 2018-10-19 DIAGNOSIS — Z791 Long term (current) use of non-steroidal anti-inflammatories (NSAID): Secondary | ICD-10-CM | POA: Insufficient documentation

## 2018-10-19 DIAGNOSIS — R5383 Other fatigue: Secondary | ICD-10-CM

## 2018-10-19 DIAGNOSIS — R197 Diarrhea, unspecified: Secondary | ICD-10-CM | POA: Insufficient documentation

## 2018-10-19 DIAGNOSIS — Z7982 Long term (current) use of aspirin: Secondary | ICD-10-CM | POA: Diagnosis not present

## 2018-10-19 LAB — CBC WITH DIFFERENTIAL (CANCER CENTER ONLY)
Abs Immature Granulocytes: 0.04 10*3/uL (ref 0.00–0.07)
Basophils Absolute: 0 10*3/uL (ref 0.0–0.1)
Basophils Relative: 1 %
Eosinophils Absolute: 0 10*3/uL (ref 0.0–0.5)
Eosinophils Relative: 0 %
HCT: 28.4 % — ABNORMAL LOW (ref 36.0–46.0)
Hemoglobin: 9 g/dL — ABNORMAL LOW (ref 12.0–15.0)
Immature Granulocytes: 2 %
Lymphocytes Relative: 19 %
Lymphs Abs: 0.4 10*3/uL — ABNORMAL LOW (ref 0.7–4.0)
MCH: 28.5 pg (ref 26.0–34.0)
MCHC: 31.7 g/dL (ref 30.0–36.0)
MCV: 89.9 fL (ref 80.0–100.0)
Monocytes Absolute: 0.3 10*3/uL (ref 0.1–1.0)
Monocytes Relative: 15 %
Neutro Abs: 1.4 10*3/uL — ABNORMAL LOW (ref 1.7–7.7)
Neutrophils Relative %: 63 %
Platelet Count: 289 10*3/uL (ref 150–400)
RBC: 3.16 MIL/uL — ABNORMAL LOW (ref 3.87–5.11)
RDW: 19.5 % — ABNORMAL HIGH (ref 11.5–15.5)
WBC Count: 2.2 10*3/uL — ABNORMAL LOW (ref 4.0–10.5)
nRBC: 0 % (ref 0.0–0.2)

## 2018-10-19 LAB — CMP (CANCER CENTER ONLY)
ALT: 29 U/L (ref 0–44)
AST: 24 U/L (ref 15–41)
Albumin: 3.6 g/dL (ref 3.5–5.0)
Alkaline Phosphatase: 67 U/L (ref 38–126)
Anion gap: 11 (ref 5–15)
BUN: 13 mg/dL (ref 8–23)
CO2: 25 mmol/L (ref 22–32)
Calcium: 8.8 mg/dL — ABNORMAL LOW (ref 8.9–10.3)
Chloride: 107 mmol/L (ref 98–111)
Creatinine: 0.85 mg/dL (ref 0.44–1.00)
GFR, Est AFR Am: >60 mL/min (ref >60)
GFR, Estimated: >60 mL/min (ref >60)
Glucose, Bld: 120 mg/dL — ABNORMAL HIGH (ref 70–99)
Potassium: 3.8 mmol/L (ref 3.5–5.1)
Sodium: 143 mmol/L (ref 135–145)
Total Bilirubin: 0.2 mg/dL — ABNORMAL LOW (ref 0.3–1.2)
Total Protein: 6.7 g/dL (ref 6.5–8.1)

## 2018-10-19 MED ORDER — SODIUM CHLORIDE 0.9% FLUSH
10.0000 mL | INTRAVENOUS | Status: DC | PRN
  Administered 2018-10-19: 12:00:00 10 mL
  Filled 2018-10-19: qty 10

## 2018-10-19 MED ORDER — SODIUM CHLORIDE 0.9 % IV SOLN
Freq: Once | INTRAVENOUS | Status: AC
  Administered 2018-10-19: 09:00:00 via INTRAVENOUS
  Filled 2018-10-19: qty 250

## 2018-10-19 MED ORDER — DEXAMETHASONE SODIUM PHOSPHATE 10 MG/ML IJ SOLN
10.0000 mg | Freq: Once | INTRAMUSCULAR | Status: AC
  Administered 2018-10-19: 09:00:00 10 mg via INTRAVENOUS

## 2018-10-19 MED ORDER — DEXAMETHASONE SODIUM PHOSPHATE 10 MG/ML IJ SOLN
INTRAMUSCULAR | Status: AC
  Filled 2018-10-19: qty 1

## 2018-10-19 MED ORDER — SODIUM CHLORIDE 0.9 % IV SOLN
10.0000 mg | Freq: Once | INTRAVENOUS | Status: DC
  Filled 2018-10-19: qty 1

## 2018-10-19 MED ORDER — FAMOTIDINE IN NACL 20-0.9 MG/50ML-% IV SOLN
20.0000 mg | Freq: Once | INTRAVENOUS | Status: AC
  Administered 2018-10-19: 10:00:00 20 mg via INTRAVENOUS

## 2018-10-19 MED ORDER — VALACYCLOVIR HCL 500 MG PO TABS: 500.0000 mg | ORAL_TABLET | Freq: Two times a day (BID) | ORAL | 1 refills | Status: DC

## 2018-10-19 MED ORDER — SODIUM CHLORIDE 0.9 % IV SOLN
60.0000 mg/m2 | Freq: Once | INTRAVENOUS | Status: AC
  Administered 2018-10-19: 11:00:00 132 mg via INTRAVENOUS
  Filled 2018-10-19: qty 22

## 2018-10-19 MED ORDER — HEPARIN SOD (PORK) LOCK FLUSH 100 UNIT/ML IV SOLN
500.0000 [IU] | Freq: Once | INTRAVENOUS | Status: AC | PRN
  Administered 2018-10-19: 12:00:00 500 [IU]
  Filled 2018-10-19: qty 5

## 2018-10-19 MED ORDER — DIPHENHYDRAMINE HCL 50 MG/ML IJ SOLN
INTRAMUSCULAR | Status: AC
  Filled 2018-10-19: qty 1

## 2018-10-19 MED ORDER — SODIUM CHLORIDE 0.9% FLUSH
10.0000 mL | Freq: Once | INTRAVENOUS | Status: AC
  Administered 2018-10-19: 10 mL
  Filled 2018-10-19: qty 10

## 2018-10-19 MED ORDER — DIPHENHYDRAMINE HCL 50 MG/ML IJ SOLN
25.0000 mg | Freq: Once | INTRAMUSCULAR | Status: AC
  Administered 2018-10-19: 09:00:00 25 mg via INTRAVENOUS

## 2018-10-19 MED ORDER — FAMOTIDINE IN NACL 20-0.9 MG/50ML-% IV SOLN
INTRAVENOUS | Status: AC
  Filled 2018-10-19: qty 50

## 2018-10-19 NOTE — Progress Notes (Signed)
Ok to treat with ANC 1.4 per Wilber Bihari, NP

## 2018-10-19 NOTE — Progress Notes (Signed)
[facility] Follow up:    Charlene Perry, [place] [address]   DIAGNOSIS: Cancer Staging Malignant neoplasm of upper-outer quadrant of right breast in female, estrogen receptor positive ([place]) Staging form: Breast, AJCC 8th Edition - Clinical: Stage IB (cT1c, cN1, cM0, G2, ER+, PR+, HER2-) - Signed by Gardenia Phlegm, NP on 12/23/2017   SUMMARY OF ONCOLOGIC HISTORY:   Malignant neoplasm of upper-outer quadrant of right breast in female, estrogen receptor positive ([employer])   11/12/2017 Initial Diagnosis    Palpable lesion in the right breast upper outer quadrant mammogram and ultrasound revealed 1.4 cm tumor ultrasound-guided biopsy revealed grade 2 IDC ER 3+, PR 3+, HER-2 negative with a Ki-67 of 10%.  Right axillary lymph node biopsy positive, T1cN1 stage I a clinical stage    12/23/2017 Cancer Staging    Staging form: Breast, AJCC 8th Edition - Clinical: Stage IB (cT1c, cN1, cM0, G2, ER+, PR+, HER2-) - Signed by Gardenia Phlegm, NP on 12/23/2017    01/10/2018 Breast MRI    Biopsy-proven malignancy UOQ right breast measuring 6 cm.  Anterior inferior to this is a 1 cm mass which needs biopsy.  Focal non-mass enhancement LOQ retroareolar right breast, left breast 7 mm irregular enhancement, 2 suspicious right axillary lymph nodes    01/27/2018 Procedure    Right breast biopsy UOQ: Grade 1 ILC, ALH Right breast biopsy 6:00: LCIS Left breast biopsy: Benign    06/21/2018 Surgery    Rt Mastectomy: 7 cm fibrotic mass ILC Grade 2, 5/6 LN Positive, ER 90-100%, PR 5%-70%, Ki 67 2-10%, Her 2 Neg, T3N2    07/13/2018 Surgery    Right axillary lymph node dissection: 3/10 lymph nodes positive making total number of lymph nodes 8/16    08/10/2018 -  Chemotherapy    Adjuvant chemotherapy with dose dense Adriamycin and Cytoxan x4 followed by Taxol weekly x12     CURRENT THERAPY: Taxol week 2  INTERVAL HISTORY: Charlene Perry 68 y.o. female returns for evaluation of her estrogen positive breast cancer prior to receiving adjuvant chemotherapy with weekly Taxol.  She received this last week dose reduced at 66m/m2 due to neutropenia of ANC 1.1 (was 0.2 week prior when treatment was held).  She notes that she was sleepy from the benadryl, however otherwise tolerated it well.  She has had a couple of episodes of loose bowel movements, denies nausea/vomiting.  She developed some mouth sores that started yesterday.  She has been using biotene for these.  She notes that she is also fatigued, stating that she will be doing normal activities, and then need to take breaks or rest more frequently.  She also falls asleep easier.     Patient Active Problem List   Diagnosis Date Noted  . Port-A-Cath in place 08/24/2018  . [facility] cancer, [facility] ([facility] 06/21/2018  . Genetic testing 01/14/2018  . Family history of colon cancer   . Family history of breast cancer   . Malignant neoplasm of upper-outer quadrant of right breast in female, estrogen receptor positive ([facility] 12/15/2017    has No Known Allergies.  MEDICAL HISTORY: Past Medical History:  Diagnosis Date  . Arthritis   . Breast cancer in female ([facility]    Right  . [facility] ([facility]   . [facility] mellitus without complication ([facility]   . Family history of breast cancer   . Family history of colon cancer   . Hypertension    takes fluid pills  .  Hypothyroidism   . Pink eye disease of left eye    went to eye md and received drops - yesterday.  . Sleep apnea    back in the 1990's  . Vitiligo     SURGICAL HISTORY: Past Surgical History:  Procedure Laterality Date  . AXILLARY LYMPH NODE DISSECTION Right 07/13/2018   Procedure: RIGHT AXILLARY LYMPH NODE DISSECTION;  Surgeon: [name], MD;  Location: [facility];  Service: General;  Laterality: Right;  . BREAST SURGERY     left breast fibro adenoma  . CATARACT EXTRACTION Right 2019  . CERVICAL FUSION N/A    C5  &6  . CESAREAN SECTION    . CHOLECYSTECTOMY  2004-2005  . CYSTECTOMY     patient denies  . EYE SURGERY    . IR IMAGING GUIDED PORT INSERTION  07/29/2018  . MASTECTOMY WITH RADIOACTIVE SEED GUIDED EXCISION AND AXILLARY SENTINEL LYMPH NODE BIOPSY Right 06/21/2018  . MASTECTOMY WITH RADIOACTIVE SEED GUIDED EXCISION AND AXILLARY SENTINEL LYMPH NODE BIOPSY Right 06/21/2018   Procedure: RIGHT TOTAL MASTECTOMY WITH RIGHT AXILLARY SENTINEL NODE BIOPSY AND RIGHT SEED GUIDED NODE EXCISION;  Surgeon: [name], MD;  Location: [facility];  Service: General;  Laterality: Right;  . MYOMECTOMY  1986  . PARTIAL HYSTERECTOMY  1995  . PILONIDAL CYST EXCISION    . PORTACATH PLACEMENT N/A 07/13/2018   Procedure: INSERTION PORT-A-CATH WITH ULTRASOUND;  Surgeon: [name], MD;  Location: [facility];  Service: General;  Laterality: N/A;  . TONSILLECTOMY  2002  . UVULOPALATOPHARYNGOPLASTY, TONSILLECTOMY AND SEPTOPLASTY      SOCIAL HISTORY: Social History   Socioeconomic History  . Marital status: Married    Spouse name: Not on file  . Number of children: Not on file  . Years of education: Not on file  . Highest education level: Not on file  Occupational History  . Not on file  Social Needs  . Financial resource strain: Not on file  . Food insecurity:    Worry: Not on file    Inability: Not on file  . Transportation needs:    Medical: Not on file    Non-medical: Not on file  Tobacco Use  . Smoking status: Never Smoker  . Smokeless tobacco: Never Used  Substance and Sexual Activity  . Alcohol use: Not Currently  . Drug use: Never  . Sexual activity: Not on file  Lifestyle  . Physical activity:    Days per week: Not on file    Minutes per session: Not on file  . Stress: Not on file  Relationships  . Social connections:    Talks on phone: Not on file    Gets together: Not on file    Attends religious service: Not on file    Active member of club or organization: Not on file    Attends  meetings of clubs or organizations: Not on file    Relationship status: Not on file  . Intimate partner violence:    Fear of current or ex partner: Not on file    Emotionally abused: Not on file    Physically abused: Not on file    Forced sexual activity: Not on file  Other Topics Concern  . Not on file  Social History Narrative  . Not on file    FAMILY HISTORY: Family History  Problem Relation Age of Onset  . Colon cancer Mother 36  . Colon cancer Sister 36  . Colon cancer Brother  ex early 31's  . Breast cancer Other        40's    Review of Systems  Constitutional: Positive for fatigue. Negative for appetite change, chills and fever.  HENT:   Positive for mouth sores. Negative for hearing loss, lump/mass, sore throat and trouble swallowing.   Eyes: Negative for eye problems and icterus.  Respiratory: Negative for chest tightness, cough and shortness of breath.   Cardiovascular: Negative for chest pain, leg swelling and palpitations.  Gastrointestinal: Negative for abdominal distention, abdominal pain, constipation, diarrhea, nausea and vomiting.  Endocrine: Negative for hot flashes.  Genitourinary: Negative for difficulty urinating.   Musculoskeletal: Negative for arthralgias.  Skin: Negative for itching.  Neurological: Negative for dizziness, extremity weakness, headaches and numbness.  Hematological: Negative for adenopathy. Does not bruise/bleed easily.  Psychiatric/Behavioral: Negative for depression. The patient is not nervous/anxious.       PHYSICAL EXAMINATION  ECOG PERFORMANCE STATUS: 1 - Symptomatic but completely ambulatory  Vitals:   10/19/18 0814  BP: (!) 141/82  Pulse: (!) 101  Resp: 17  Temp: 97.9 F (36.6 C)  SpO2: 98%    Physical Exam Constitutional:      Appearance: Normal appearance. She is not toxic-appearing.  HENT:     Head: Normocephalic and atraumatic.     Nose: Nose normal.     Mouth/Throat:     Mouth: Mucous membranes are  moist.     Pharynx: No oropharyngeal exudate or posterior oropharyngeal erythema.     Comments: Three mall ulcers noted on left lower inner buccal mucosa Eyes:     General: No scleral icterus.    Pupils: Pupils are equal, round, and reactive to light.  Neck:     Musculoskeletal: Neck supple.  Cardiovascular:     Rate and Rhythm: Normal rate and regular rhythm.     Pulses: Normal pulses.     Heart sounds: Normal heart sounds.  Pulmonary:     Effort: Pulmonary effort is normal.     Breath sounds: Normal breath sounds. No wheezing.  Abdominal:     General: Abdomen is flat. There is no distension.     Palpations: Abdomen is soft. There is no mass.     Tenderness: There is no abdominal tenderness.  Musculoskeletal:        General: No swelling.     Right lower leg: No edema.     Left lower leg: No edema.  Lymphadenopathy:     Cervical: No cervical adenopathy.  Skin:    General: Skin is warm and dry.     Capillary Refill: Capillary refill takes less than 2 seconds.     Findings: No rash.  Neurological:     General: No focal deficit present.     Mental Status: She is alert.  Psychiatric:        Mood and Affect: Mood normal.        Behavior: Behavior normal.     LABORATORY DATA:  CBC    Component Value Date/Time   WBC 2.2 (L) 10/19/2018 0804   WBC 6.1 07/29/2018 0836   RBC 3.16 (L) 10/19/2018 0804   HGB 9.0 (L) 10/19/2018 0804   HCT 28.4 (L) 10/19/2018 0804   PLT 289 10/19/2018 0804   MCV 89.9 10/19/2018 0804   MCH 28.5 10/19/2018 0804   MCHC 31.7 10/19/2018 0804   RDW 19.5 (H) 10/19/2018 0804   LYMPHSABS 0.4 (L) 10/19/2018 0804   MONOABS 0.3 10/19/2018 0804   EOSABS 0.0 10/19/2018 0804  BASOSABS 0.0 10/19/2018 0804    CMP     Component Value Date/Time   NA 143 10/19/2018 0804   K 3.8 10/19/2018 0804   CL 107 10/19/2018 0804   CO2 25 10/19/2018 0804   GLUCOSE 120 (H) 10/19/2018 0804   BUN 13 10/19/2018 0804   CREATININE 0.85 10/19/2018 0804   CALCIUM 8.8  (L) 10/19/2018 0804   PROT 6.7 10/19/2018 0804   ALBUMIN 3.6 10/19/2018 0804   AST 24 10/19/2018 0804   ALT 29 10/19/2018 0804   ALKPHOS 67 10/19/2018 0804   BILITOT 0.2 (L) 10/19/2018 0804   GFRNONAA >60 10/19/2018 0804   GFRAA >60 10/19/2018 0804       ASSESSMENT and THERAPY PLAN:   Malignant neoplasm of upper-outer quadrant of right breast in female, estrogen receptor positive ([place]) 06/21/17:Rt Mastectomy: 7 cm fibrotic mass ILC Grade 2, 5/6 LN Positive, ER 90-100%, PR 5%-70%, Ki 67 2-10%, Her 2 Neg, T3N2 Treatment plan: 1. Adj chemo 2. Adj RT 3. Adj Anti estrogen therapy  CT CAP: 07/02/2018: Subtle hypoattenuating lesion upper pole left kidney could be a cyst, urology consulted, left adrenal adenoma. --------------------------------------------------------------------------------------------------------------------- Current treatment: Completed 4 cycles ofdose dense Adriamycin and Cytoxan, Today is cycle 2 Taxol  Labs reviewed Echocardiogram: 07/02/2018: EF 60 to 65%  Chemo toxicities: 1.Diarrhea: essentially resolved 2.Mild nausea: controlled 3.Mild fatigue: managing well.  She will rest when needed 4.  Leukopenia:  ANC 1.4, ok to treat today, Taxol dose reduced to 81m/m2 5.  Mucositis: + Magic mouthwash and Valtrex.  Reviewed oral care. 6.  COVID19 precautions: I reviewed that she is immunocomprimised due to her chemotherapy treatment and considering that and other comorbidities I recommended that she work from home if she needs to return to work.  Reviewed that due to the above she is at increased risk for complications from [id number] she get it.    Monitoring closely for peripheral neuropathy.  None at this point.   She will return weekly for labs and treatment and will see Dr. GLindi Adiein 2 weeks.     All questions were answered. The patient knows to call the clinic with any problems, questions or concerns. We can certainly see the patient much sooner if  necessary.  A total of (30) minutes of face-to-face time was spent with this patient with greater than 50% of that time in counseling and care-coordination.  This note was electronically signed. LScot Dock NP 10/19/2018

## 2018-10-19 NOTE — Assessment & Plan Note (Addendum)
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 Adj chemo 2. Adj RT 3. Adj Anti estrogen therapy  CT CAP: 07/02/2018: Subtle hypoattenuating lesion upper pole left kidney could be a cyst, urology consulted, left adrenal adenoma. --------------------------------------------------------------------------------------------------------------------- Current treatment: Completed 4 cycles ofdose dense Adriamycin and Cytoxan, Today is cycle 2 Taxol  Labs reviewed Echocardiogram: 07/02/2018: EF 60 to 65%  Chemo toxicities: 1.Diarrhea: essentially resolved 2.Mild nausea: controlled 3.Mild fatigue: managing well.  She will rest when needed 4.  Leukopenia: ANC 1.4, ok to treat today, Taxol dose reduced to 60mg /m2 5.  Mucositis: + Magic mouthwash and Valtrex.  Reviewed oral care. 6.  COVID19 precautions: I reviewed that she is immunocomprimised due to her chemotherapy treatment and considering that and other comorbidities I recommended that she work from home if she needs to return to work.  Reviewed that due to the above she is at increased risk for complications from [id number] should she get it.    Monitoring closely for peripheral neuropathy.  None at this point.   She will return weekly for labs and treatment and will see Dr. Lindi Adie in 2 weeks.

## 2018-10-19 NOTE — Patient Instructions (Signed)
[facility] Discharge Instructions for Patients Receiving Chemotherapy  Today you received the following chemotherapy agents Paclitaxel (TAXOL).  To help prevent nausea and vomiting after your treatment, we encourage you to take your nausea medication as prescribed.   If you develop nausea and vomiting that is not controlled by your nausea medication, call the clinic.   BELOW ARE SYMPTOMS THAT SHOULD BE REPORTED IMMEDIATELY:  *FEVER GREATER THAN 100.5 F  *CHILLS WITH OR WITHOUT FEVER  NAUSEA AND VOMITING THAT IS NOT CONTROLLED WITH YOUR NAUSEA MEDICATION  *UNUSUAL SHORTNESS OF BREATH  *UNUSUAL BRUISING OR BLEEDING  TENDERNESS IN MOUTH AND THROAT WITH OR WITHOUT PRESENCE OF ULCERS  *URINARY PROBLEMS  *BOWEL PROBLEMS  UNUSUAL RASH Items with * indicate a potential emergency and should be followed up as soon as possible.  Feel free to call the clinic should you have any questions or concerns. The clinic phone number is [phone].  Please show the [place] at check-in to the Emergency Department and triage nurse.  Paclitaxel injection What is this medicine? PACLITAXEL (PAK li TAX el) is a chemotherapy drug. It targets fast dividing cells, like cancer cells, and causes these cells to die. This medicine is used to treat ovarian cancer, breast cancer, lung cancer, Kaposi's sarcoma, and other cancers. This medicine may be used for other purposes; ask your health care provider or pharmacist if you have questions. COMMON BRAND NAME(S): Onxol, Taxol What should I tell my health care provider before I take this medicine? They need to know if you have any of these conditions: -history of irregular heartbeat -liver disease -low blood counts, like low white cell, platelet, or red cell counts -lung or breathing disease, like asthma -tingling of the fingers or toes, or other nerve disorder -an unusual or allergic reaction to paclitaxel, alcohol,  polyoxyethylated castor oil, other chemotherapy, other medicines, foods, dyes, or preservatives -pregnant or trying to get pregnant -breast-feeding How should I use this medicine? This drug is given as an infusion into a vein. It is administered in a hospital or clinic by a specially trained health care professional. Talk to your pediatrician regarding the use of this medicine in children. Special care may be needed. Overdosage: If you think you have taken too much of this medicine contact a poison control center or emergency room at once. NOTE: This medicine is only for you. Do not share this medicine with others. What if I miss a dose? It is important not to miss your dose. Call your doctor or health care professional if you are unable to keep an appointment. What may interact with this medicine? Do not take this medicine with any of the following medications: -disulfiram -metronidazole This medicine may also interact with the following medications: -antiviral medicines for hepatitis, HIV or AIDS -certain antibiotics like erythromycin and clarithromycin -certain medicines for fungal infections like ketoconazole and itraconazole -certain medicines for seizures like carbamazepine, phenobarbital, phenytoin -gemfibrozil -nefazodone -rifampin -St. John's wort This list may not describe all possible interactions. Give your health care provider a list of all the medicines, herbs, non-prescription drugs, or dietary supplements you use. Also tell them if you smoke, drink alcohol, or use illegal drugs. Some items may interact with your medicine. What should I watch for while using this medicine? Your condition will be monitored carefully while you are receiving this medicine. You will need important blood work done while you are taking this medicine. This medicine can cause serious allergic reactions. To reduce your  risk you will need to take other medicine(s) before treatment with this medicine.  If you experience allergic reactions like skin rash, itching or hives, swelling of the face, lips, or tongue, tell your doctor or health care professional right away. In some cases, you may be given additional medicines to help with side effects. Follow all directions for their use. This drug may make you feel generally unwell. This is not uncommon, as chemotherapy can affect healthy cells as well as cancer cells. Report any side effects. Continue your course of treatment even though you feel ill unless your doctor tells you to stop. Call your doctor or health care professional for advice if you get a fever, chills or sore throat, or other symptoms of a cold or flu. Do not treat yourself. This drug decreases your body's ability to fight infections. Try to avoid being around people who are sick. This medicine may increase your risk to bruise or bleed. Call your doctor or health care professional if you notice any unusual bleeding. Be careful brushing and flossing your teeth or using a toothpick because you may get an infection or bleed more easily. If you have any dental work done, tell your dentist you are receiving this medicine. Avoid taking products that contain aspirin, acetaminophen, ibuprofen, naproxen, or ketoprofen unless instructed by your doctor. These medicines may hide a fever. Do not become pregnant while taking this medicine. Women should inform their doctor if they wish to become pregnant or think they might be pregnant. There is a potential for serious side effects to an unborn child. Talk to your health care professional or pharmacist for more information. Do not breast-feed an infant while taking this medicine. Men are advised not to father a child while receiving this medicine. This product may contain alcohol. Ask your pharmacist or healthcare provider if this medicine contains alcohol. Be sure to tell all healthcare providers you are taking this medicine. Certain medicines, like  metronidazole and disulfiram, can cause an unpleasant reaction when taken with alcohol. The reaction includes flushing, headache, nausea, vomiting, sweating, and increased thirst. The reaction can last from 30 minutes to several hours. What side effects may I notice from receiving this medicine? Side effects that you should report to your doctor or health care professional as soon as possible: -allergic reactions like skin rash, itching or hives, swelling of the face, lips, or tongue -breathing problems -changes in vision -fast, irregular heartbeat -high or low blood pressure -mouth sores -pain, tingling, numbness in the hands or feet -signs of decreased platelets or bleeding - bruising, pinpoint red spots on the skin, black, tarry stools, blood in the urine -signs of decreased red blood cells - unusually weak or tired, feeling faint or lightheaded, falls -signs of infection - fever or chills, cough, sore throat, pain or difficulty passing urine -signs and symptoms of liver injury like dark yellow or brown urine; general ill feeling or flu-like symptoms; light-colored stools; loss of appetite; nausea; right upper belly pain; unusually weak or tired; yellowing of the eyes or skin -swelling of the ankles, feet, hands -unusually slow heartbeat Side effects that usually do not require medical attention (report to your doctor or health care professional if they continue or are bothersome): -diarrhea -hair loss -loss of appetite -muscle or joint pain -nausea, vomiting -pain, redness, or irritation at site where injected -tiredness This list may not describe all possible side effects. Call your doctor for medical advice about side effects. You may report side effects to  FDA at 1-800-FDA-1088. Where should I keep my medicine? This drug is given in a hospital or clinic and will not be stored at home. NOTE: This sheet is a summary. It may not cover all possible information. If you have questions  about this medicine, talk to your doctor, pharmacist, or health care provider.  2019 Elsevier/Gold Standard (2017-02-03 13:14:55)  Coronavirus (COVID-19) Are you at risk?  Are you at risk for the Coronavirus (COVID-19)?  To be considered HIGH RISK for Coronavirus (COVID-19), you have to meet the following criteria:  . Traveled to [place], [place], [place], [place] or [place]; or in the [place] to [place], [place], [place], or [place]; and have fever, cough, and shortness of breath within the last 2 weeks of travel OR . Been in close contact with a person diagnosed with COVID-19 within the last 2 weeks and have fever, cough, and shortness of breath . IF YOU DO NOT MEET THESE CRITERIA, YOU ARE CONSIDERED LOW RISK FOR COVID-19.  What to do if you are HIGH RISK for COVID-19?  Marland Kitchen If you are having a medical emergency, call 911. . Seek medical care right away. Before you go to a doctor's office, urgent care or emergency department, call ahead and tell them about your recent travel, contact with someone diagnosed with COVID-19, and your symptoms. You should receive instructions from your physician's office regarding next steps of care.  . When you arrive at healthcare provider, tell the healthcare staff immediately you have returned from visiting [place], [place], [place], [place] or [place]; or traveled in the [place] to [place], [place], [place], or [place]; in the last two weeks or you have been in close contact with a person diagnosed with COVID-19 in the last 2 weeks.   . Tell the health care staff about your symptoms: fever, cough and shortness of breath. . After you have been seen by a medical provider, you will be either: o Tested for (COVID-19) and discharged home on quarantine except to seek medical care if symptoms worsen, and asked to  - Stay home and avoid contact with others until you get your results (4-5 days)  - Avoid travel on public transportation if possible  (such as bus, train, or airplane) or o Sent to the Emergency Department by EMS for evaluation, COVID-19 testing, and possible admission depending on your condition and test results.  What to do if you are LOW RISK for COVID-19?  Reduce your risk of any infection by using the same precautions used for avoiding the common cold or flu:  Marland Kitchen Wash your hands often with soap and warm water for at least 20 seconds.  If soap and water are not readily available, use an alcohol-based hand sanitizer with at least 60% alcohol.  . If coughing or sneezing, cover your mouth and nose by coughing or sneezing into the elbow areas of your shirt or coat, into a tissue or into your sleeve (not your hands). . Avoid shaking hands with others and consider head nods or verbal greetings only. . Avoid touching your eyes, nose, or mouth with unwashed hands.  . Avoid close contact with people who are sick. . Avoid places or events with large numbers of people in one location, like concerts or sporting events. . Carefully consider travel plans you have or are making. . If you are planning any travel outside or inside the [place], visit the CDC's Travelers' Health webpage for the latest health notices. Marland Kitchen  If you have some symptoms but not all symptoms, continue to monitor at home and seek medical attention if your symptoms worsen. . If you are having a medical emergency, call 911.   [facility] / e-Visit: [email or web address]         [facility]: [facility]: [phone]                   [facility]: [phone]

## 2018-10-26 ENCOUNTER — Inpatient Hospital Stay: Payer: BLUE CROSS/BLUE SHIELD

## 2018-10-26 ENCOUNTER — Other Ambulatory Visit: Payer: Self-pay | Admitting: Hematology and Oncology

## 2018-10-26 ENCOUNTER — Other Ambulatory Visit: Payer: Self-pay | Admitting: Medical

## 2018-10-26 ENCOUNTER — Inpatient Hospital Stay (HOSPITAL_BASED_OUTPATIENT_CLINIC_OR_DEPARTMENT_OTHER): Payer: BLUE CROSS/BLUE SHIELD | Admitting: Medical

## 2018-10-26 ENCOUNTER — Other Ambulatory Visit: Payer: Self-pay

## 2018-10-26 VITALS — BP 152/76 | HR 100 | Temp 98.6°F | Resp 20 | Wt 217.5 lb

## 2018-10-26 DIAGNOSIS — C50011 Malignant neoplasm of nipple and areola, right female breast: Secondary | ICD-10-CM

## 2018-10-26 DIAGNOSIS — Z170 Estrogen receptor positive status [ER+]: Secondary | ICD-10-CM

## 2018-10-26 DIAGNOSIS — C50411 Malignant neoplasm of upper-outer quadrant of right female breast: Secondary | ICD-10-CM

## 2018-10-26 DIAGNOSIS — Z5189 Encounter for other specified aftercare: Secondary | ICD-10-CM | POA: Diagnosis not present

## 2018-10-26 DIAGNOSIS — Z95828 Presence of other vascular implants and grafts: Secondary | ICD-10-CM

## 2018-10-26 DIAGNOSIS — K1230 Oral mucositis (ulcerative), unspecified: Secondary | ICD-10-CM

## 2018-10-26 LAB — CBC WITH DIFFERENTIAL (CANCER CENTER ONLY)
Abs Immature Granulocytes: 0.01 10*3/uL (ref 0.00–0.07)
Basophils Absolute: 0 10*3/uL (ref 0.0–0.1)
Basophils Relative: 1 %
Eosinophils Absolute: 0 10*3/uL (ref 0.0–0.5)
Eosinophils Relative: 1 %
HCT: 29.3 % — ABNORMAL LOW (ref 36.0–46.0)
Hemoglobin: 9.3 g/dL — ABNORMAL LOW (ref 12.0–15.0)
Immature Granulocytes: 1 %
Lymphocytes Relative: 25 %
Lymphs Abs: 0.5 10*3/uL — ABNORMAL LOW (ref 0.7–4.0)
MCH: 29.2 pg (ref 26.0–34.0)
MCHC: 31.7 g/dL (ref 30.0–36.0)
MCV: 92.1 fL (ref 80.0–100.0)
Monocytes Absolute: 0.3 10*3/uL (ref 0.1–1.0)
Monocytes Relative: 14 %
Neutro Abs: 1.2 10*3/uL — ABNORMAL LOW (ref 1.7–7.7)
Neutrophils Relative %: 58 %
Platelet Count: 265 10*3/uL (ref 150–400)
RBC: 3.18 MIL/uL — ABNORMAL LOW (ref 3.87–5.11)
RDW: 19.7 % — ABNORMAL HIGH (ref 11.5–15.5)
WBC Count: 2 10*3/uL — ABNORMAL LOW (ref 4.0–10.5)
nRBC: 0 % (ref 0.0–0.2)

## 2018-10-26 LAB — CMP (CANCER CENTER ONLY)
ALT: 36 U/L (ref 0–44)
AST: 25 U/L (ref 15–41)
Albumin: 3.6 g/dL (ref 3.5–5.0)
Alkaline Phosphatase: 71 U/L (ref 38–126)
Anion gap: 11 (ref 5–15)
BUN: 13 mg/dL (ref 8–23)
CO2: 25 mmol/L (ref 22–32)
Calcium: 9 mg/dL (ref 8.9–10.3)
Chloride: 107 mmol/L (ref 98–111)
Creatinine: 0.92 mg/dL (ref 0.44–1.00)
GFR, Est AFR Am: >60 mL/min (ref >60)
GFR, Estimated: >60 mL/min (ref >60)
Glucose, Bld: 154 mg/dL — ABNORMAL HIGH (ref 70–99)
Potassium: 3.8 mmol/L (ref 3.5–5.1)
Sodium: 143 mmol/L (ref 135–145)
Total Bilirubin: 0.3 mg/dL (ref 0.3–1.2)
Total Protein: 6.6 g/dL (ref 6.5–8.1)

## 2018-10-26 MED ORDER — FAMOTIDINE IN NACL 20-0.9 MG/50ML-% IV SOLN
INTRAVENOUS | Status: AC
  Filled 2018-10-26: qty 50

## 2018-10-26 MED ORDER — DIPHENHYDRAMINE HCL 50 MG/ML IJ SOLN
25.0000 mg | Freq: Once | INTRAMUSCULAR | Status: AC
  Administered 2018-10-26: 10:00:00 25 mg via INTRAVENOUS

## 2018-10-26 MED ORDER — DEXAMETHASONE SODIUM PHOSPHATE 10 MG/ML IJ SOLN
INTRAMUSCULAR | Status: AC
  Filled 2018-10-26: qty 1

## 2018-10-26 MED ORDER — DIPHENHYDRAMINE HCL 50 MG/ML IJ SOLN
INTRAMUSCULAR | Status: AC
  Filled 2018-10-26: qty 1

## 2018-10-26 MED ORDER — HEPARIN SOD (PORK) LOCK FLUSH 100 UNIT/ML IV SOLN
500.0000 [IU] | Freq: Once | INTRAVENOUS | Status: AC | PRN
  Administered 2018-10-26: 12:00:00 500 [IU]
  Filled 2018-10-26: qty 5

## 2018-10-26 MED ORDER — DEXAMETHASONE SODIUM PHOSPHATE 10 MG/ML IJ SOLN
10.0000 mg | Freq: Once | INTRAMUSCULAR | Status: AC
  Administered 2018-10-26: 10:00:00 10 mg via INTRAVENOUS

## 2018-10-26 MED ORDER — COLD PACK MISC ONCOLOGY
1.0000 | Freq: Once | Status: AC | PRN
  Administered 2018-10-26: 11:00:00 1 via TOPICAL
  Filled 2018-10-26: qty 1

## 2018-10-26 MED ORDER — SODIUM CHLORIDE 0.9% FLUSH
10.0000 mL | Freq: Once | INTRAVENOUS | Status: AC
  Administered 2018-10-26: 10 mL
  Filled 2018-10-26: qty 10

## 2018-10-26 MED ORDER — SODIUM CHLORIDE 0.9% FLUSH
10.0000 mL | INTRAVENOUS | Status: DC | PRN
  Administered 2018-10-26: 12:00:00 10 mL
  Filled 2018-10-26: qty 10

## 2018-10-26 MED ORDER — MAGIC MOUTHWASH: 5.0000 mL | Freq: Four times a day (QID) | ORAL | 2 refills | Status: DC | PRN

## 2018-10-26 MED ORDER — SODIUM CHLORIDE 0.9 % IV SOLN
60.0000 mg/m2 | Freq: Once | INTRAVENOUS | Status: AC
  Administered 2018-10-26: 11:00:00 132 mg via INTRAVENOUS
  Filled 2018-10-26: qty 22

## 2018-10-26 MED ORDER — FAMOTIDINE IN NACL 20-0.9 MG/50ML-% IV SOLN
20.0000 mg | Freq: Once | INTRAVENOUS | Status: AC
  Administered 2018-10-26: 10:00:00 20 mg via INTRAVENOUS

## 2018-10-26 MED ORDER — SODIUM CHLORIDE 0.9 % IV SOLN
Freq: Once | INTRAVENOUS | Status: AC
  Administered 2018-10-26: 09:00:00 via INTRAVENOUS
  Filled 2018-10-26: qty 250

## 2018-10-26 NOTE — Progress Notes (Signed)
Per Dr. Lindi Adie ok for chemotherapy treatment today with ANC 1.2

## 2018-10-26 NOTE — Progress Notes (Signed)
The patient is a 68 year old female with a history of an ER positive right breast cancer who is followed by Dr. [name] and is receiving cycle 3, day 1 of Taxol today.  She was seen in the infusion room where she reported having multiple small lesions in her mouth.  She request a rinse for her mucositis.  Her exam showed several small superficial lesions in the buccal membranes bilaterally.  A prescription for Magic mouthwash was faxed to her pharmacy with a confirmation receipt returned. She was also told that she could use baking soda and warm water.  Sandi Mealy, MHS, PA-C Physician Assistant

## 2018-10-26 NOTE — Patient Instructions (Signed)
[facility] Discharge Instructions for Patients Receiving Chemotherapy  Today you received the following chemotherapy agents Taxol  To help prevent nausea and vomiting after your treatment, we encourage you to take your nausea medication: As directed by MD   If you develop nausea and vomiting that is not controlled by your nausea medication, call the clinic.   BELOW ARE SYMPTOMS THAT SHOULD BE REPORTED IMMEDIATELY:  *FEVER GREATER THAN 100.5 F  *CHILLS WITH OR WITHOUT FEVER  NAUSEA AND VOMITING THAT IS NOT CONTROLLED WITH YOUR NAUSEA MEDICATION  *UNUSUAL SHORTNESS OF BREATH  *UNUSUAL BRUISING OR BLEEDING  TENDERNESS IN MOUTH AND THROAT WITH OR WITHOUT PRESENCE OF ULCERS  *URINARY PROBLEMS  *BOWEL PROBLEMS  UNUSUAL RASH Items with * indicate a potential emergency and should be followed up as soon as possible.  Feel free to call the clinic should you have any questions or concerns. The clinic phone number is [phone].  Please show the [facility] at check-in to the Emergency Department and triage nurse.  Coronavirus (COVID-19) Are you at risk?  Are you at risk for the Coronavirus (COVID-19)?  To be considered HIGH RISK for Coronavirus (COVID-19), you have to meet the following criteria:  . Traveled to [place], [place], [place], [place] or [place]; or in the [place] to [place], [place], [place], or [place]; and have fever, cough, and shortness of breath within the last 2 weeks of travel OR . Been in close contact with a person diagnosed with COVID-19 within the last 2 weeks and have fever, cough, and shortness of breath . IF YOU DO NOT MEET THESE CRITERIA, YOU ARE CONSIDERED LOW RISK FOR COVID-19.  What to do if you are HIGH RISK for COVID-19?  Marland Kitchen If you are having a medical emergency, call 911. . Seek medical care right away. Before you go to a doctor's office, urgent care or emergency department, call ahead and tell them about your  recent travel, contact with someone diagnosed with COVID-19, and your symptoms. You should receive instructions from your physician's office regarding next steps of care.  . When you arrive at healthcare provider, tell the healthcare staff immediately you have returned from visiting [place], [place], [place], [place] or [place]; or traveled in the [place] to [place], [place], [place], or [place]; in the last two weeks or you have been in close contact with a person diagnosed with COVID-19 in the last 2 weeks.   . Tell the health care staff about your symptoms: fever, cough and shortness of breath. . After you have been seen by a medical provider, you will be either: o Tested for (COVID-19) and discharged home on quarantine except to seek medical care if symptoms worsen, and asked to  - Stay home and avoid contact with others until you get your results (4-5 days)  - Avoid travel on public transportation if possible (such as bus, train, or airplane) or o Sent to the Emergency Department by EMS for evaluation, COVID-19 testing, and possible admission depending on your condition and test results.  What to do if you are LOW RISK for COVID-19?  Reduce your risk of any infection by using the same precautions used for avoiding the common cold or flu:  Marland Kitchen Wash your hands often with soap and warm water for at least 20 seconds.  If soap and water are not readily available, use an alcohol-based hand sanitizer with at least 60% alcohol.  . If coughing  or sneezing, cover your mouth and nose by coughing or sneezing into the elbow areas of your shirt or coat, into a tissue or into your sleeve (not your hands). . Avoid shaking hands with others and consider head nods or verbal greetings only. . Avoid touching your eyes, nose, or mouth with unwashed hands.  . Avoid close contact with people who are sick. . Avoid places or events with large numbers of people in one location, like concerts or sporting  events. . Carefully consider travel plans you have or are making. . If you are planning any travel outside or inside the [place], visit the CDC's Travelers' Health webpage for the latest health notices. . If you have some symptoms but not all symptoms, continue to monitor at home and seek medical attention if your symptoms worsen. . If you are having a medical emergency, call 911.   ADDITIONAL HEALTHCARE OPTIONS FOR PATIENTS  [facility] / e-Visit: [email or web address]         [facility]: [phone]  [facility]: [phone]                   [facility]: [phone]    

## 2018-10-27 NOTE — Assessment & Plan Note (Signed)
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 Adj chemo 2. Adj RT 3. Adj Anti estrogen therapy  CT CAP: 07/02/2018: Subtle hypoattenuating lesion upper pole left kidney could be a cyst, urology consulted, left adrenal adenoma. --------------------------------------------------------------------------------------------------------------------- Current treatment:Completed 4 cycles ofdose dense Adriamycin and Cytoxan, Today is cycle 2 Taxol Labs reviewed Echocardiogram: 07/02/2018: EF 60 to 65%  Chemo toxicities: 1.Diarrhea: essentially resolved 2.Mild nausea: controlled 3.Mild fatigue: managing well.  4.Leukopenia:Taxol dose reduced to 60mg /m2 5.  Mucositis: Magic mouthwash.  Monitoring closely for peripheral neuropathy. Return to clinic weekly for Taxol and every other week for follow-up with me.

## 2018-11-01 NOTE — Progress Notes (Signed)
Patient Care Team: [name], MD as PCP - General (Family Medicine)  DIAGNOSIS:    ICD-10-CM   1. Malignant neoplasm of upper-outer quadrant of right breast in female, estrogen receptor positive ([place]) C50.411    Z17.0     SUMMARY OF ONCOLOGIC HISTORY:   Malignant neoplasm of upper-outer quadrant of right breast in female, estrogen receptor positive ([place])   11/12/2017 Initial Diagnosis    Palpable lesion in the right breast upper outer quadrant mammogram and ultrasound revealed 1.4 cm tumor ultrasound-guided biopsy revealed grade 2 IDC ER 3+, PR 3+, HER-2 negative with a Ki-67 of 10%.  Right axillary lymph node biopsy positive, T1cN1 stage I a clinical stage    12/23/2017 Cancer Staging    Staging form: Breast, AJCC 8th Edition - Clinical: Stage IB (cT1c, cN1, cM0, G2, ER+, PR+, HER2-) - Signed by Gardenia Phlegm, NP on 12/23/2017    01/10/2018 Breast MRI    Biopsy-proven malignancy UOQ right breast measuring 6 cm.  Anterior inferior to this is a 1 cm mass which needs biopsy.  Focal non-mass enhancement LOQ retroareolar right breast, left breast 7 mm irregular enhancement, 2 suspicious right axillary lymph nodes    01/27/2018 Procedure    Right breast biopsy UOQ: Grade 1 ILC, ALH Right breast biopsy 6:00: LCIS Left breast biopsy: Benign    06/21/2018 Surgery    Rt Mastectomy: 7 cm fibrotic mass ILC Grade 2, 5/6 LN Positive, ER 90-100%, PR 5%-70%, Ki 67 2-10%, Her 2 Neg, T3N2    07/13/2018 Surgery    Right axillary lymph node dissection: 3/10 lymph nodes positive making total number of lymph nodes 8/16    08/10/2018 -  Chemotherapy    Adjuvant chemotherapy with dose dense Adriamycin and Cytoxan x4 followed by Taxol weekly x12     CHIEF COMPLIANT: Cycle 4 Taxol  INTERVAL HISTORY: Charlene Perry is a 68 y.o. with above-mentioned history of right breast cancer who underwent a right mastectomyandcompleted 4 cycles ofadjuvant chemotherapy with dose dense  Adriamycin and Cytoxan. She is currently receiving weekly Taxol treatments. She presents to the clinic today for cycle 4.  Her biggest complaint is related to dark spots appearing on the face.  She has vitiligo and had lost pigmentation previously.  She also had 2 days of diarrhea Saturday and Sunday which caused her to be severely fatigued as well as muscle cramps.  REVIEW OF SYSTEMS:   Constitutional: Denies fevers, chills or abnormal weight loss Eyes: Denies blurriness of vision Ears, nose, mouth, throat, and face: Denies mucositis or sore throat Respiratory: Denies cough, dyspnea or wheezes Cardiovascular: Denies palpitation, chest discomfort Gastrointestinal: Denies nausea, heartburn or change in bowel habits Skin: Dark spots on the face Lymphatics: Denies new lymphadenopathy or easy bruising Neurological: Denies numbness, tingling or new weaknesses Behavioral/Psych: Mood is stable, no new changes  Extremities: No lower extremity edema Breast: denies any pain or lumps or nodules in either breasts All other systems were reviewed with the patient and are negative.  I have reviewed the past medical history, past surgical history, social history and family history with the patient and they are unchanged from previous note.  ALLERGIES:  has No Known Allergies.  MEDICATIONS:  Current Outpatient Medications  Medication Sig Dispense Refill  . aspirin EC 81 MG tablet Take 81 mg by mouth daily.    . celecoxib (CELEBREX) 200 MG capsule Take 200 mg by mouth daily.    . cholecalciferol (VITAMIN D3) 25 MCG (1000 UT) tablet  Take 1 tablet (1,000 Units total) by mouth daily.    . furosemide (LASIX) 40 MG tablet Take 80 mg by mouth daily.    Marland Kitchen gabapentin (NEURONTIN) 400 MG capsule Take 400 mg by mouth 2 (two) times daily.    Marland Kitchen levothyroxine (SYNTHROID, LEVOTHROID) 125 MCG tablet Take 125 mcg by mouth daily before breakfast.    . lidocaine-prilocaine (EMLA) cream Apply to affected area once 30 g 3   . LORazepam (ATIVAN) 0.5 MG tablet Take 1 tablet (0.5 mg total) by mouth at bedtime as needed (Nausea or vomiting). 30 tablet 0  . magic mouthwash SOLN Take 5 mLs by mouth 4 (four) times daily as needed for mouth pain. 240 mL 2  . metFORMIN (GLUCOPHAGE) 1000 MG tablet Take 1,000 mg by mouth 2 (two) times daily with a meal.    . ondansetron (ZOFRAN) 8 MG tablet Take 1 tablet (8 mg total) by mouth 2 (two) times daily as needed. Start on the third day after chemotherapy. 30 tablet 1  . pantoprazole (PROTONIX) 40 MG tablet Take 1 tablet (40 mg total) by mouth daily. 30 tablet 1  . potassium chloride (K-DUR,KLOR-CON) 10 MEQ tablet Take 20 mEq by mouth daily.     . prochlorperazine (COMPAZINE) 10 MG tablet Take 1 tablet (10 mg total) by mouth every 6 (six) hours as needed (Nausea or vomiting). 30 tablet 1  . rosuvastatin (CRESTOR) 40 MG tablet Take 40 mg by mouth at bedtime.     . valACYclovir (VALTREX) 500 MG tablet Take 1 tablet (500 mg total) by mouth 2 (two) times daily. 60 tablet 1   No current facility-administered medications for this visit.     PHYSICAL EXAMINATION: ECOG PERFORMANCE STATUS: 1 - Symptomatic but completely ambulatory  Vitals:   11/02/18 1028  BP: (!) 151/75  Pulse: (!) 106  Resp: 18  Temp: 98.3 F (36.8 C)  SpO2: 99%   Filed Weights   11/02/18 1028  Weight: 210 lb 12.8 oz (95.6 kg)    GENERAL: alert, no distress and comfortable SKIN: skin color, texture, turgor are normal, no rashes or significant lesions EYES: normal, Conjunctiva are pink and non-injected, sclera clear OROPHARYNX: no exudate, no erythema and lips, buccal mucosa, and tongue normal  NECK: supple, thyroid normal size, non-tender, without nodularity LYMPH: no palpable lymphadenopathy in the cervical, axillary or inguinal LUNGS: clear to auscultation and percussion with normal breathing effort HEART: regular rate & rhythm and no murmurs and no lower extremity edema ABDOMEN: abdomen soft, non-tender  and normal bowel sounds MUSCULOSKELETAL: no cyanosis of digits and no clubbing  NEURO: alert & oriented x 3 with fluent speech, no focal motor/sensory deficits EXTREMITIES: No lower extremity edema  LABORATORY DATA:  I have reviewed the data as listed CMP Latest Ref Rng & Units 10/26/2018 10/19/2018 10/12/2018  Glucose 70 - 99 mg/dL 154(H) 120(H) 108(H)  BUN 8 - 23 mg/dL '13 13 12  '$ Creatinine 0.44 - 1.00 mg/dL 0.92 0.85 0.86  Sodium 135 - 145 mmol/L 143 143 145  Potassium 3.5 - 5.1 mmol/L 3.8 3.8 3.9  Chloride 98 - 111 mmol/L 107 107 107  CO2 22 - 32 mmol/L '25 25 27  '$ Calcium 8.9 - 10.3 mg/dL 9.0 8.8(L) 8.7(L)  Total Protein 6.5 - 8.1 g/dL 6.6 6.7 6.7  Total Bilirubin 0.3 - 1.2 mg/dL 0.3 0.2(L) 0.2(L)  Alkaline Phos 38 - 126 U/L 71 67 79  AST 15 - 41 U/L '25 24 17  '$ ALT 0 - 44 U/L  36 29 21    Lab Results  Component Value Date   WBC 1.9 (L) 11/02/2018   HGB 10.6 (L) 11/02/2018   HCT 33.0 (L) 11/02/2018   MCV 91.7 11/02/2018   PLT 259 11/02/2018   NEUTROABS 0.9 (L) 11/02/2018    ASSESSMENT & PLAN:  Malignant neoplasm of upper-outer quadrant of right breast in female, estrogen receptor positive ([place]) 06/21/17:Rt Mastectomy: 7 cm fibrotic mass ILC Grade 2, 5/6 LN Positive, ER 90-100%, PR 5%-70%, Ki 67 2-10%, Her 2 Neg, T3N2 Treatment plan: 1. Adj chemo 2. Adj RT 3. Adj Anti estrogen therapy  CT CAP: 07/02/2018: Subtle hypoattenuating lesion upper pole left kidney could be a cyst, urology consulted, left adrenal adenoma. --------------------------------------------------------------------------------------------------------------------- Current treatment:Completed 4 cycles ofdose dense Adriamycin and Cytoxan, Today is cycle 4 Taxol Labs reviewed Echocardiogram: 07/02/2018: EF 60 to 65%  Chemo toxicities: 1.Diarrhea: Lasted 2 days on Saturday and Sunday but this caused her to be weak and have muscle cramps. 2.Mild nausea: controlled 3.Mild fatigue: managing well.   4.Leukopenia:Taxol dose reduced to '60mg'$ /m2, today her ANC is 0.9.  She will start receiving Granix injections every Saturday.  We will proceed with today's treatment with that ANC of 0.9. 5.  Mucositis: Magic mouthwash. 6.  Skin discoloration on the face: Dark spots are appearing on the face.  Her hemoglobin is improved to 10.6. Monitoring closely for peripheral neuropathy and cytopenias. Return to clinic weekly for Taxol and every other week for follow-up with me.    No orders of the defined types were placed in this encounter.  The patient has a good understanding of the overall plan. she agrees with it. she will call with any problems that may develop before the next visit here.  [name], MD 11/02/2018  Julious Oka Dorshimer am acting as scribe for Dr. [name].  I have reviewed the above documentation for accuracy and completeness, and I agree with the above.

## 2018-11-02 ENCOUNTER — Inpatient Hospital Stay: Payer: BLUE CROSS/BLUE SHIELD

## 2018-11-02 ENCOUNTER — Other Ambulatory Visit: Payer: Self-pay

## 2018-11-02 ENCOUNTER — Inpatient Hospital Stay (HOSPITAL_BASED_OUTPATIENT_CLINIC_OR_DEPARTMENT_OTHER): Payer: BLUE CROSS/BLUE SHIELD | Admitting: Hematology and Oncology

## 2018-11-02 VITALS — HR 100

## 2018-11-02 DIAGNOSIS — C50011 Malignant neoplasm of nipple and areola, right female breast: Secondary | ICD-10-CM

## 2018-11-02 DIAGNOSIS — Z170 Estrogen receptor positive status [ER+]: Secondary | ICD-10-CM

## 2018-11-02 DIAGNOSIS — K1230 Oral mucositis (ulcerative), unspecified: Secondary | ICD-10-CM

## 2018-11-02 DIAGNOSIS — Z7982 Long term (current) use of aspirin: Secondary | ICD-10-CM

## 2018-11-02 DIAGNOSIS — R197 Diarrhea, unspecified: Secondary | ICD-10-CM

## 2018-11-02 DIAGNOSIS — R252 Cramp and spasm: Secondary | ICD-10-CM

## 2018-11-02 DIAGNOSIS — D701 Agranulocytosis secondary to cancer chemotherapy: Secondary | ICD-10-CM

## 2018-11-02 DIAGNOSIS — C50411 Malignant neoplasm of upper-outer quadrant of right female breast: Secondary | ICD-10-CM

## 2018-11-02 DIAGNOSIS — Z800 Family history of malignant neoplasm of digestive organs: Secondary | ICD-10-CM

## 2018-11-02 DIAGNOSIS — Z79899 Other long term (current) drug therapy: Secondary | ICD-10-CM

## 2018-11-02 DIAGNOSIS — Z95828 Presence of other vascular implants and grafts: Secondary | ICD-10-CM

## 2018-11-02 DIAGNOSIS — Z791 Long term (current) use of non-steroidal anti-inflammatories (NSAID): Secondary | ICD-10-CM

## 2018-11-02 DIAGNOSIS — R21 Rash and other nonspecific skin eruption: Secondary | ICD-10-CM

## 2018-11-02 DIAGNOSIS — E119 Type 2 diabetes mellitus without complications: Secondary | ICD-10-CM

## 2018-11-02 DIAGNOSIS — Z7984 Long term (current) use of oral hypoglycemic drugs: Secondary | ICD-10-CM

## 2018-11-02 DIAGNOSIS — Z8249 Family history of ischemic heart disease and other diseases of the circulatory system: Secondary | ICD-10-CM

## 2018-11-02 DIAGNOSIS — R5383 Other fatigue: Secondary | ICD-10-CM

## 2018-11-02 DIAGNOSIS — Z5189 Encounter for other specified aftercare: Secondary | ICD-10-CM | POA: Diagnosis not present

## 2018-11-02 DIAGNOSIS — Z803 Family history of malignant neoplasm of breast: Secondary | ICD-10-CM

## 2018-11-02 LAB — CMP (CANCER CENTER ONLY)
ALT: 29 U/L (ref 0–44)
AST: 21 U/L (ref 15–41)
Albumin: 4 g/dL (ref 3.5–5.0)
Alkaline Phosphatase: 73 U/L (ref 38–126)
Anion gap: 10 (ref 5–15)
BUN: 14 mg/dL (ref 8–23)
CO2: 26 mmol/L (ref 22–32)
Calcium: 8.7 mg/dL — ABNORMAL LOW (ref 8.9–10.3)
Chloride: 108 mmol/L (ref 98–111)
Creatinine: 1.02 mg/dL — ABNORMAL HIGH (ref 0.44–1.00)
GFR, Est AFR Am: >60 mL/min (ref >60)
GFR, Estimated: 57 mL/min — ABNORMAL LOW (ref >60)
Glucose, Bld: 146 mg/dL — ABNORMAL HIGH (ref 70–99)
Potassium: 3.5 mmol/L (ref 3.5–5.1)
Sodium: 144 mmol/L (ref 135–145)
Total Bilirubin: 0.3 mg/dL (ref 0.3–1.2)
Total Protein: 7.2 g/dL (ref 6.5–8.1)

## 2018-11-02 LAB — CBC WITH DIFFERENTIAL (CANCER CENTER ONLY)
Abs Immature Granulocytes: 0.01 10*3/uL (ref 0.00–0.07)
Basophils Absolute: 0 10*3/uL (ref 0.0–0.1)
Basophils Relative: 2 %
Eosinophils Absolute: 0.1 10*3/uL (ref 0.0–0.5)
Eosinophils Relative: 5 %
HCT: 33 % — ABNORMAL LOW (ref 36.0–46.0)
Hemoglobin: 10.6 g/dL — ABNORMAL LOW (ref 12.0–15.0)
Immature Granulocytes: 1 %
Lymphocytes Relative: 26 %
Lymphs Abs: 0.5 10*3/uL — ABNORMAL LOW (ref 0.7–4.0)
MCH: 29.4 pg (ref 26.0–34.0)
MCHC: 32.1 g/dL (ref 30.0–36.0)
MCV: 91.7 fL (ref 80.0–100.0)
Monocytes Absolute: 0.4 10*3/uL (ref 0.1–1.0)
Monocytes Relative: 20 %
Neutro Abs: 0.9 10*3/uL — ABNORMAL LOW (ref 1.7–7.7)
Neutrophils Relative %: 46 %
Platelet Count: 259 10*3/uL (ref 150–400)
RBC: 3.6 MIL/uL — ABNORMAL LOW (ref 3.87–5.11)
RDW: 18.6 % — ABNORMAL HIGH (ref 11.5–15.5)
WBC Count: 1.9 10*3/uL — ABNORMAL LOW (ref 4.0–10.5)
nRBC: 0 % (ref 0.0–0.2)

## 2018-11-02 MED ORDER — DEXAMETHASONE SODIUM PHOSPHATE 10 MG/ML IJ SOLN
INTRAMUSCULAR | Status: AC
  Filled 2018-11-02: qty 1

## 2018-11-02 MED ORDER — FAMOTIDINE IN NACL 20-0.9 MG/50ML-% IV SOLN
INTRAVENOUS | Status: AC
  Filled 2018-11-02: qty 50

## 2018-11-02 MED ORDER — SODIUM CHLORIDE 0.9% FLUSH
10.0000 mL | INTRAVENOUS | Status: DC | PRN
  Administered 2018-11-02: 13:00:00 10 mL
  Filled 2018-11-02: qty 10

## 2018-11-02 MED ORDER — SODIUM CHLORIDE 0.9 % IV SOLN
Freq: Once | INTRAVENOUS | Status: AC
  Administered 2018-11-02: 11:00:00 via INTRAVENOUS
  Filled 2018-11-02: qty 250

## 2018-11-02 MED ORDER — DIPHENHYDRAMINE HCL 50 MG/ML IJ SOLN
INTRAMUSCULAR | Status: AC
  Filled 2018-11-02: qty 1

## 2018-11-02 MED ORDER — DEXAMETHASONE SODIUM PHOSPHATE 10 MG/ML IJ SOLN
10.0000 mg | Freq: Once | INTRAMUSCULAR | Status: AC
  Administered 2018-11-02: 10 mg via INTRAVENOUS

## 2018-11-02 MED ORDER — DIPHENHYDRAMINE HCL 50 MG/ML IJ SOLN
25.0000 mg | Freq: Once | INTRAMUSCULAR | Status: AC
  Administered 2018-11-02: 25 mg via INTRAVENOUS

## 2018-11-02 MED ORDER — HEPARIN SOD (PORK) LOCK FLUSH 100 UNIT/ML IV SOLN
500.0000 [IU] | Freq: Once | INTRAVENOUS | Status: AC | PRN
  Administered 2018-11-02: 13:00:00 500 [IU]
  Filled 2018-11-02: qty 5

## 2018-11-02 MED ORDER — SODIUM CHLORIDE 0.9 % IV SOLN
60.0000 mg/m2 | Freq: Once | INTRAVENOUS | Status: AC
  Administered 2018-11-02: 132 mg via INTRAVENOUS
  Filled 2018-11-02: qty 22

## 2018-11-02 MED ORDER — SODIUM CHLORIDE 0.9% FLUSH
10.0000 mL | Freq: Once | INTRAVENOUS | Status: AC
  Administered 2018-11-02: 10 mL
  Filled 2018-11-02: qty 10

## 2018-11-02 MED ORDER — FAMOTIDINE IN NACL 20-0.9 MG/50ML-% IV SOLN
20.0000 mg | Freq: Once | INTRAVENOUS | Status: AC
  Administered 2018-11-02: 20 mg via INTRAVENOUS

## 2018-11-02 NOTE — Progress Notes (Signed)
OK to tx per Dr. Geralyn Flash note from today:  Leukopenia:Taxol dose reduced to 60mg /m2, today her ANC is 0.9.  She will start receiving Granix injections every Saturday.  We will proceed with today's treatment with that [place] of 0.9.

## 2018-11-02 NOTE — Patient Instructions (Signed)
[facility] Discharge Instructions for Patients Receiving Chemotherapy  Today you received the following chemotherapy agents Taxol  To help prevent nausea and vomiting after your treatment, we encourage you to take your nausea medication: As directed by MD   If you develop nausea and vomiting that is not controlled by your nausea medication, call the clinic.   BELOW ARE SYMPTOMS THAT SHOULD BE REPORTED IMMEDIATELY:  *FEVER GREATER THAN 100.5 F  *CHILLS WITH OR WITHOUT FEVER  NAUSEA AND VOMITING THAT IS NOT CONTROLLED WITH YOUR NAUSEA MEDICATION  *UNUSUAL SHORTNESS OF BREATH  *UNUSUAL BRUISING OR BLEEDING  TENDERNESS IN MOUTH AND THROAT WITH OR WITHOUT PRESENCE OF ULCERS  *URINARY PROBLEMS  *BOWEL PROBLEMS  UNUSUAL RASH Items with * indicate a potential emergency and should be followed up as soon as possible.  Feel free to call the clinic should you have any questions or concerns. The clinic phone number is [phone].  Please show the [place] at check-in to the Emergency Department and triage nurse.

## 2018-11-02 NOTE — Patient Instructions (Signed)

## 2018-11-04 ENCOUNTER — Telehealth: Payer: Self-pay | Admitting: Hematology and Oncology

## 2018-11-04 NOTE — Telephone Encounter (Signed)
Called regarding schedule °

## 2018-11-05 ENCOUNTER — Other Ambulatory Visit: Payer: Self-pay | Admitting: Adult Health

## 2018-11-05 ENCOUNTER — Other Ambulatory Visit: Payer: Self-pay | Admitting: *Deleted

## 2018-11-06 ENCOUNTER — Inpatient Hospital Stay: Payer: BLUE CROSS/BLUE SHIELD

## 2018-11-06 ENCOUNTER — Other Ambulatory Visit: Payer: Self-pay

## 2018-11-06 VITALS — BP 145/78 | HR 100 | Temp 99.1°F | Resp 18

## 2018-11-06 DIAGNOSIS — Z5189 Encounter for other specified aftercare: Secondary | ICD-10-CM | POA: Diagnosis not present

## 2018-11-06 DIAGNOSIS — Z95828 Presence of other vascular implants and grafts: Secondary | ICD-10-CM

## 2018-11-06 DIAGNOSIS — C50011 Malignant neoplasm of nipple and areola, right female breast: Secondary | ICD-10-CM

## 2018-11-06 MED ORDER — TBO-FILGRASTIM 480 MCG/0.8ML ~~LOC~~ SOSY
480.0000 ug | PREFILLED_SYRINGE | Freq: Once | SUBCUTANEOUS | Status: AC
  Administered 2018-11-06: 480 ug via SUBCUTANEOUS

## 2018-11-06 MED ORDER — TBO-FILGRASTIM 480 MCG/0.8ML ~~LOC~~ SOSY
PREFILLED_SYRINGE | SUBCUTANEOUS | Status: AC
  Filled 2018-11-06: qty 0.8

## 2018-11-06 NOTE — Patient Instructions (Signed)
Tbo-Filgrastim injection (Granix) What is this medicine? TBO-FILGRASTIM (T B O fil GRA stim) is a granulocyte colony-stimulating factor that stimulates the growth of neutrophils, a type of white blood cell important in the body's fight against infection. It is used to reduce the incidence of fever and infection in patients with certain types of cancer who are receiving chemotherapy that affects the bone marrow. This medicine may be used for other purposes; ask your health care provider or pharmacist if you have questions. COMMON BRAND NAME(S): Granix What should I tell my health care provider before I take this medicine? They need to know if you have any of these conditions: -bone scan or tests planned -kidney disease -sickle cell anemia -an unusual or allergic reaction to tbo-filgrastim, filgrastim, pegfilgrastim, other medicines, foods, dyes, or preservatives -pregnant or trying to get pregnant -breast-feeding How should I use this medicine? This medicine is for injection under the skin. If you get this medicine at home, you will be taught how to prepare and give this medicine. Refer to the Instructions for Use that come with your medication packaging. Use exactly as directed. Take your medicine at regular intervals. Do not take your medicine more often than directed. It is important that you put your used needles and syringes in a special sharps container. Do not put them in a trash can. If you do not have a sharps container, call your pharmacist or healthcare provider to get one. Talk to your pediatrician regarding the use of this medicine in children. While this drug may be prescribed for children as young as 35 month of age for selected conditions, precautions do apply. Overdosage: If you think you have taken too much of this medicine contact a poison control center or emergency room at once. NOTE: This medicine is only for you. Do not share this medicine with others. What if I miss a  dose? It is important not to miss your dose. Call your doctor or health care professional if you miss a dose. What may interact with this medicine? This medicine may interact with the following medications: -medicines that may cause a release of neutrophils, such as lithium This list may not describe all possible interactions. Give your health care provider a list of all the medicines, herbs, non-prescription drugs, or dietary supplements you use. Also tell them if you smoke, drink alcohol, or use illegal drugs. Some items may interact with your medicine. What should I watch for while using this medicine? You may need blood work done while you are taking this medicine. What side effects may I notice from receiving this medicine? Side effects that you should report to your doctor or health care professional as soon as possible: -allergic reactions like skin rash, itching or hives, swelling of the face, lips, or tongue -back pain -blood in the urine -dark urine -dizziness -fast heartbeat -feeling faint -shortness of breath or breathing problems -signs and symptoms of infection like fever or chills; cough; or sore throat -signs and symptoms of kidney injury like trouble passing urine or change in the amount of urine -stomach or side pain, or pain at the shoulder -sweating -swelling of the legs, ankles, or abdomen -tiredness Side effects that usually do not require medical attention (report to your doctor or health care professional if they continue or are bothersome): -bone pain -diarrhea -headache -muscle pain -vomiting This list may not describe all possible side effects. Call your doctor for medical advice about side effects. You may report side effects to FDA  at 1-800-FDA-1088. Where should I keep my medicine? Keep out of the reach of children. Store in a refrigerator between 2 and 8 degrees C (36 and 46 degrees F). Keep in carton to protect from light. Throw away this medicine if  it is left out of the refrigerator for more than 5 consecutive days. Throw away any unused medicine after the expiration date. NOTE: This sheet is a summary. It may not cover all possible information. If you have questions about this medicine, talk to your doctor, pharmacist, or health care provider.  2019 Elsevier/Gold Standard (2017-01-20 16:56:18)

## 2018-11-09 ENCOUNTER — Inpatient Hospital Stay: Payer: BLUE CROSS/BLUE SHIELD

## 2018-11-09 ENCOUNTER — Other Ambulatory Visit: Payer: Self-pay

## 2018-11-09 VITALS — BP 150/72 | HR 98 | Temp 98.0°F | Resp 18 | Wt 216.8 lb

## 2018-11-09 DIAGNOSIS — C50411 Malignant neoplasm of upper-outer quadrant of right female breast: Secondary | ICD-10-CM

## 2018-11-09 DIAGNOSIS — Z95828 Presence of other vascular implants and grafts: Secondary | ICD-10-CM

## 2018-11-09 DIAGNOSIS — Z5189 Encounter for other specified aftercare: Secondary | ICD-10-CM | POA: Diagnosis not present

## 2018-11-09 DIAGNOSIS — C50011 Malignant neoplasm of nipple and areola, right female breast: Secondary | ICD-10-CM

## 2018-11-09 DIAGNOSIS — Z170 Estrogen receptor positive status [ER+]: Secondary | ICD-10-CM

## 2018-11-09 LAB — CMP (CANCER CENTER ONLY)
ALT: 21 U/L (ref 0–44)
AST: 17 U/L (ref 15–41)
Albumin: 3.7 g/dL (ref 3.5–5.0)
Alkaline Phosphatase: 92 U/L (ref 38–126)
Anion gap: 11 (ref 5–15)
BUN: 10 mg/dL (ref 8–23)
CO2: 27 mmol/L (ref 22–32)
Calcium: 8.5 mg/dL — ABNORMAL LOW (ref 8.9–10.3)
Chloride: 106 mmol/L (ref 98–111)
Creatinine: 1.03 mg/dL — ABNORMAL HIGH (ref 0.44–1.00)
GFR, Est AFR Am: >60 mL/min (ref >60)
GFR, Estimated: 57 mL/min — ABNORMAL LOW (ref >60)
Glucose, Bld: 161 mg/dL — ABNORMAL HIGH (ref 70–99)
Potassium: 3.2 mmol/L — ABNORMAL LOW (ref 3.5–5.1)
Sodium: 144 mmol/L (ref 135–145)
Total Bilirubin: 0.3 mg/dL (ref 0.3–1.2)
Total Protein: 6.5 g/dL (ref 6.5–8.1)

## 2018-11-09 LAB — CBC WITH DIFFERENTIAL (CANCER CENTER ONLY)
Abs Immature Granulocytes: 0.78 10*3/uL — ABNORMAL HIGH (ref 0.00–0.07)
Basophils Absolute: 0.1 10*3/uL (ref 0.0–0.1)
Basophils Relative: 1 %
Eosinophils Absolute: 0.1 10*3/uL (ref 0.0–0.5)
Eosinophils Relative: 1 %
HCT: 31.2 % — ABNORMAL LOW (ref 36.0–46.0)
Hemoglobin: 10.1 g/dL — ABNORMAL LOW (ref 12.0–15.0)
Immature Granulocytes: 6 %
Lymphocytes Relative: 7 %
Lymphs Abs: 0.9 10*3/uL (ref 0.7–4.0)
MCH: 30.1 pg (ref 26.0–34.0)
MCHC: 32.4 g/dL (ref 30.0–36.0)
MCV: 92.9 fL (ref 80.0–100.0)
Monocytes Absolute: 0.7 10*3/uL (ref 0.1–1.0)
Monocytes Relative: 5 %
Neutro Abs: 11.1 10*3/uL — ABNORMAL HIGH (ref 1.7–7.7)
Neutrophils Relative %: 80 %
Platelet Count: 201 10*3/uL (ref 150–400)
RBC: 3.36 MIL/uL — ABNORMAL LOW (ref 3.87–5.11)
RDW: 17.9 % — ABNORMAL HIGH (ref 11.5–15.5)
WBC Count: 13.6 10*3/uL — ABNORMAL HIGH (ref 4.0–10.5)
nRBC: 0.1 % (ref 0.0–0.2)

## 2018-11-09 MED ORDER — SODIUM CHLORIDE 0.9 % IV SOLN
Freq: Once | INTRAVENOUS | Status: AC
  Administered 2018-11-09: 10:00:00 via INTRAVENOUS
  Filled 2018-11-09: qty 250

## 2018-11-09 MED ORDER — POTASSIUM CHLORIDE CRYS ER 20 MEQ PO TBCR
20.0000 meq | EXTENDED_RELEASE_TABLET | Freq: Once | ORAL | Status: AC
  Administered 2018-11-09: 20 meq via ORAL

## 2018-11-09 MED ORDER — FAMOTIDINE IN NACL 20-0.9 MG/50ML-% IV SOLN
20.0000 mg | Freq: Once | INTRAVENOUS | Status: AC
  Administered 2018-11-09: 20 mg via INTRAVENOUS

## 2018-11-09 MED ORDER — SODIUM CHLORIDE 0.9% FLUSH
10.0000 mL | INTRAVENOUS | Status: DC | PRN
  Administered 2018-11-09: 10 mL
  Filled 2018-11-09: qty 10

## 2018-11-09 MED ORDER — DIPHENHYDRAMINE HCL 50 MG/ML IJ SOLN
25.0000 mg | Freq: Once | INTRAMUSCULAR | Status: AC
  Administered 2018-11-09: 25 mg via INTRAVENOUS

## 2018-11-09 MED ORDER — SODIUM CHLORIDE 0.9% FLUSH
10.0000 mL | Freq: Once | INTRAVENOUS | Status: AC
  Administered 2018-11-09: 10 mL
  Filled 2018-11-09: qty 10

## 2018-11-09 MED ORDER — DEXAMETHASONE SODIUM PHOSPHATE 10 MG/ML IJ SOLN
INTRAMUSCULAR | Status: AC
  Filled 2018-11-09: qty 1

## 2018-11-09 MED ORDER — SODIUM CHLORIDE 0.9 % IV SOLN
60.0000 mg/m2 | Freq: Once | INTRAVENOUS | Status: AC
  Administered 2018-11-09: 132 mg via INTRAVENOUS
  Filled 2018-11-09: qty 22

## 2018-11-09 MED ORDER — POTASSIUM CHLORIDE CRYS ER 20 MEQ PO TBCR
EXTENDED_RELEASE_TABLET | ORAL | Status: AC
  Filled 2018-11-09: qty 1

## 2018-11-09 MED ORDER — HEPARIN SOD (PORK) LOCK FLUSH 100 UNIT/ML IV SOLN
500.0000 [IU] | Freq: Once | INTRAVENOUS | Status: AC | PRN
  Administered 2018-11-09: 500 [IU]
  Filled 2018-11-09: qty 5

## 2018-11-09 MED ORDER — DIPHENHYDRAMINE HCL 50 MG/ML IJ SOLN
INTRAMUSCULAR | Status: AC
  Filled 2018-11-09: qty 1

## 2018-11-09 MED ORDER — DEXAMETHASONE SODIUM PHOSPHATE 10 MG/ML IJ SOLN
10.0000 mg | Freq: Once | INTRAMUSCULAR | Status: AC
  Administered 2018-11-09: 10:00:00 10 mg via INTRAVENOUS

## 2018-11-09 MED ORDER — FAMOTIDINE IN NACL 20-0.9 MG/50ML-% IV SOLN
INTRAVENOUS | Status: AC
  Filled 2018-11-09: qty 50

## 2018-11-09 NOTE — Progress Notes (Signed)
Pt K 3.2, per Dr. Lindi Adie pt to receive 20 mEq potassium p.o today while in infusion.  Infusion RN Shelton Silvas notified.

## 2018-11-09 NOTE — Patient Instructions (Signed)
[facility] Discharge Instructions for Patients Receiving Chemotherapy  Today you received the following chemotherapy agents Taxol  To help prevent nausea and vomiting after your treatment, we encourage you to take your nausea medication: As directed by MD   If you develop nausea and vomiting that is not controlled by your nausea medication, call the clinic.   BELOW ARE SYMPTOMS THAT SHOULD BE REPORTED IMMEDIATELY:  *FEVER GREATER THAN 100.5 F  *CHILLS WITH OR WITHOUT FEVER  NAUSEA AND VOMITING THAT IS NOT CONTROLLED WITH YOUR NAUSEA MEDICATION  *UNUSUAL SHORTNESS OF BREATH  *UNUSUAL BRUISING OR BLEEDING  TENDERNESS IN MOUTH AND THROAT WITH OR WITHOUT PRESENCE OF ULCERS  *URINARY PROBLEMS  *BOWEL PROBLEMS  UNUSUAL RASH Items with * indicate a potential emergency and should be followed up as soon as possible.  Feel free to call the clinic should you have any questions or concerns. The clinic phone number is [phone].  Please show the [facility] at check-in to the Emergency Department and triage nurse.

## 2018-11-13 ENCOUNTER — Other Ambulatory Visit: Payer: Self-pay

## 2018-11-13 ENCOUNTER — Inpatient Hospital Stay: Payer: BLUE CROSS/BLUE SHIELD

## 2018-11-13 VITALS — BP 151/71 | HR 103 | Temp 98.5°F | Resp 18

## 2018-11-13 DIAGNOSIS — C50011 Malignant neoplasm of nipple and areola, right female breast: Secondary | ICD-10-CM

## 2018-11-13 DIAGNOSIS — Z5189 Encounter for other specified aftercare: Secondary | ICD-10-CM | POA: Diagnosis not present

## 2018-11-13 DIAGNOSIS — Z95828 Presence of other vascular implants and grafts: Secondary | ICD-10-CM

## 2018-11-13 MED ORDER — TBO-FILGRASTIM 480 MCG/0.8ML ~~LOC~~ SOSY
PREFILLED_SYRINGE | SUBCUTANEOUS | Status: AC
  Filled 2018-11-13: qty 0.8

## 2018-11-13 MED ORDER — TBO-FILGRASTIM 480 MCG/0.8ML ~~LOC~~ SOSY
480.0000 ug | PREFILLED_SYRINGE | Freq: Once | SUBCUTANEOUS | Status: AC
  Administered 2018-11-13: 480 ug via SUBCUTANEOUS

## 2018-11-13 NOTE — Patient Instructions (Signed)
Tbo-Filgrastim injection What is this medicine? TBO-FILGRASTIM (T B O fil GRA stim) is a granulocyte colony-stimulating factor that stimulates the growth of neutrophils, a type of white blood cell important in the body's fight against infection. It is used to reduce the incidence of fever and infection in patients with certain types of cancer who are receiving chemotherapy that affects the bone marrow. This medicine may be used for other purposes; ask your health care provider or pharmacist if you have questions. COMMON BRAND NAME(S): Granix What should I tell my health care provider before I take this medicine? They need to know if you have any of these conditions: -bone scan or tests planned -kidney disease -sickle cell anemia -an unusual or allergic reaction to tbo-filgrastim, filgrastim, pegfilgrastim, other medicines, foods, dyes, or preservatives -pregnant or trying to get pregnant -breast-feeding How should I use this medicine? This medicine is for injection under the skin. If you get this medicine at home, you will be taught how to prepare and give this medicine. Refer to the Instructions for Use that come with your medication packaging. Use exactly as directed. Take your medicine at regular intervals. Do not take your medicine more often than directed. It is important that you put your used needles and syringes in a special sharps container. Do not put them in a trash can. If you do not have a sharps container, call your pharmacist or healthcare provider to get one. Talk to your pediatrician regarding the use of this medicine in children. While this drug may be prescribed for children as young as 1 month of age for selected conditions, precautions do apply. Overdosage: If you think you have taken too much of this medicine contact a poison control center or emergency room at once. NOTE: This medicine is only for you. Do not share this medicine with others. What if I miss a dose? It is  important not to miss your dose. Call your doctor or health care professional if you miss a dose. What may interact with this medicine? This medicine may interact with the following medications: -medicines that may cause a release of neutrophils, such as lithium This list may not describe all possible interactions. Give your health care provider a list of all the medicines, herbs, non-prescription drugs, or dietary supplements you use. Also tell them if you smoke, drink alcohol, or use illegal drugs. Some items may interact with your medicine. What should I watch for while using this medicine? You may need blood work done while you are taking this medicine. What side effects may I notice from receiving this medicine? Side effects that you should report to your doctor or health care professional as soon as possible: -allergic reactions like skin rash, itching or hives, swelling of the face, lips, or tongue -back pain -blood in the urine -dark urine -dizziness -fast heartbeat -feeling faint -shortness of breath or breathing problems -signs and symptoms of infection like fever or chills; cough; or sore throat -signs and symptoms of kidney injury like trouble passing urine or change in the amount of urine -stomach or side pain, or pain at the shoulder -sweating -swelling of the legs, ankles, or abdomen -tiredness Side effects that usually do not require medical attention (report to your doctor or health care professional if they continue or are bothersome): -bone pain -diarrhea -headache -muscle pain -vomiting This list may not describe all possible side effects. Call your doctor for medical advice about side effects. You may report side effects to FDA at   1-800-FDA-1088. Where should I keep my medicine? Keep out of the reach of children. Store in a refrigerator between 2 and 8 degrees C (36 and 46 degrees F). Keep in carton to protect from light. Throw away this medicine if it is left out  of the refrigerator for more than 5 consecutive days. Throw away any unused medicine after the expiration date. NOTE: This sheet is a summary. It may not cover all possible information. If you have questions about this medicine, talk to your doctor, pharmacist, or health care provider.  2019 Elsevier/Gold Standard (2017-01-20 16:56:18)  

## 2018-11-16 ENCOUNTER — Encounter: Payer: Self-pay | Admitting: Adult Health

## 2018-11-16 ENCOUNTER — Inpatient Hospital Stay: Payer: BC Managed Care – PPO

## 2018-11-16 ENCOUNTER — Inpatient Hospital Stay: Payer: BC Managed Care – PPO | Attending: Hematology and Oncology

## 2018-11-16 ENCOUNTER — Other Ambulatory Visit: Payer: Self-pay

## 2018-11-16 ENCOUNTER — Inpatient Hospital Stay (HOSPITAL_BASED_OUTPATIENT_CLINIC_OR_DEPARTMENT_OTHER): Payer: BC Managed Care – PPO | Admitting: Adult Health

## 2018-11-16 VITALS — BP 144/69 | HR 98 | Temp 98.5°F | Resp 20

## 2018-11-16 VITALS — BP 165/84 | HR 102 | Temp 98.5°F | Resp 18 | Ht 66.0 in | Wt 216.1 lb

## 2018-11-16 DIAGNOSIS — Z170 Estrogen receptor positive status [ER+]: Secondary | ICD-10-CM

## 2018-11-16 DIAGNOSIS — D701 Agranulocytosis secondary to cancer chemotherapy: Secondary | ICD-10-CM | POA: Diagnosis not present

## 2018-11-16 DIAGNOSIS — Z7984 Long term (current) use of oral hypoglycemic drugs: Secondary | ICD-10-CM | POA: Insufficient documentation

## 2018-11-16 DIAGNOSIS — Z5111 Encounter for antineoplastic chemotherapy: Secondary | ICD-10-CM | POA: Insufficient documentation

## 2018-11-16 DIAGNOSIS — Z791 Long term (current) use of non-steroidal anti-inflammatories (NSAID): Secondary | ICD-10-CM | POA: Insufficient documentation

## 2018-11-16 DIAGNOSIS — Z8249 Family history of ischemic heart disease and other diseases of the circulatory system: Secondary | ICD-10-CM | POA: Insufficient documentation

## 2018-11-16 DIAGNOSIS — R5383 Other fatigue: Secondary | ICD-10-CM | POA: Diagnosis not present

## 2018-11-16 DIAGNOSIS — Z803 Family history of malignant neoplasm of breast: Secondary | ICD-10-CM | POA: Diagnosis not present

## 2018-11-16 DIAGNOSIS — Z5189 Encounter for other specified aftercare: Secondary | ICD-10-CM | POA: Diagnosis not present

## 2018-11-16 DIAGNOSIS — C50411 Malignant neoplasm of upper-outer quadrant of right female breast: Secondary | ICD-10-CM

## 2018-11-16 DIAGNOSIS — R110 Nausea: Secondary | ICD-10-CM | POA: Insufficient documentation

## 2018-11-16 DIAGNOSIS — Z800 Family history of malignant neoplasm of digestive organs: Secondary | ICD-10-CM

## 2018-11-16 DIAGNOSIS — Z79899 Other long term (current) drug therapy: Secondary | ICD-10-CM | POA: Insufficient documentation

## 2018-11-16 DIAGNOSIS — E119 Type 2 diabetes mellitus without complications: Secondary | ICD-10-CM

## 2018-11-16 DIAGNOSIS — Z95828 Presence of other vascular implants and grafts: Secondary | ICD-10-CM

## 2018-11-16 DIAGNOSIS — Z7982 Long term (current) use of aspirin: Secondary | ICD-10-CM | POA: Insufficient documentation

## 2018-11-16 DIAGNOSIS — C50011 Malignant neoplasm of nipple and areola, right female breast: Secondary | ICD-10-CM

## 2018-11-16 LAB — CBC WITH DIFFERENTIAL (CANCER CENTER ONLY)
Abs Immature Granulocytes: 0.6 10*3/uL — ABNORMAL HIGH (ref 0.00–0.07)
Basophils Absolute: 0.1 10*3/uL (ref 0.0–0.1)
Basophils Relative: 0 %
Eosinophils Absolute: 0.1 10*3/uL (ref 0.0–0.5)
Eosinophils Relative: 1 %
HCT: 33.7 % — ABNORMAL LOW (ref 36.0–46.0)
Hemoglobin: 10.8 g/dL — ABNORMAL LOW (ref 12.0–15.0)
Immature Granulocytes: 4 %
Lymphocytes Relative: 6 %
Lymphs Abs: 0.9 10*3/uL (ref 0.7–4.0)
MCH: 29.8 pg (ref 26.0–34.0)
MCHC: 32 g/dL (ref 30.0–36.0)
MCV: 93.1 fL (ref 80.0–100.0)
Monocytes Absolute: 0.7 10*3/uL (ref 0.1–1.0)
Monocytes Relative: 4 %
Neutro Abs: 14 10*3/uL — ABNORMAL HIGH (ref 1.7–7.7)
Neutrophils Relative %: 85 %
Platelet Count: 161 10*3/uL (ref 150–400)
RBC: 3.62 MIL/uL — ABNORMAL LOW (ref 3.87–5.11)
RDW: 17.5 % — ABNORMAL HIGH (ref 11.5–15.5)
WBC Count: 16.5 10*3/uL — ABNORMAL HIGH (ref 4.0–10.5)
nRBC: 0.1 % (ref 0.0–0.2)

## 2018-11-16 LAB — CMP (CANCER CENTER ONLY)
ALT: 31 U/L (ref 0–44)
AST: 25 U/L (ref 15–41)
Albumin: 4 g/dL (ref 3.5–5.0)
Alkaline Phosphatase: 112 U/L (ref 38–126)
Anion gap: 10 (ref 5–15)
BUN: 16 mg/dL (ref 8–23)
CO2: 29 mmol/L (ref 22–32)
Calcium: 9.1 mg/dL (ref 8.9–10.3)
Chloride: 104 mmol/L (ref 98–111)
Creatinine: 1.03 mg/dL — ABNORMAL HIGH (ref 0.44–1.00)
GFR, Est AFR Am: >60 mL/min (ref >60)
GFR, Estimated: 57 mL/min — ABNORMAL LOW (ref >60)
Glucose, Bld: 163 mg/dL — ABNORMAL HIGH (ref 70–99)
Potassium: 3.5 mmol/L (ref 3.5–5.1)
Sodium: 143 mmol/L (ref 135–145)
Total Bilirubin: 0.3 mg/dL (ref 0.3–1.2)
Total Protein: 7.1 g/dL (ref 6.5–8.1)

## 2018-11-16 MED ORDER — DIPHENHYDRAMINE HCL 50 MG/ML IJ SOLN
INTRAMUSCULAR | Status: AC
  Filled 2018-11-16: qty 1

## 2018-11-16 MED ORDER — SODIUM CHLORIDE 0.9 % IV SOLN
Freq: Once | INTRAVENOUS | Status: AC
  Administered 2018-11-16: 10:00:00 via INTRAVENOUS
  Filled 2018-11-16: qty 250

## 2018-11-16 MED ORDER — FAMOTIDINE IN NACL 20-0.9 MG/50ML-% IV SOLN
INTRAVENOUS | Status: AC
  Filled 2018-11-16: qty 50

## 2018-11-16 MED ORDER — SODIUM CHLORIDE 0.9 % IV SOLN
60.0000 mg/m2 | Freq: Once | INTRAVENOUS | Status: AC
  Administered 2018-11-16: 11:00:00 132 mg via INTRAVENOUS
  Filled 2018-11-16: qty 22

## 2018-11-16 MED ORDER — DEXAMETHASONE SODIUM PHOSPHATE 10 MG/ML IJ SOLN
10.0000 mg | Freq: Once | INTRAMUSCULAR | Status: AC
  Administered 2018-11-16: 10 mg via INTRAVENOUS

## 2018-11-16 MED ORDER — SODIUM CHLORIDE 0.9% FLUSH
10.0000 mL | Freq: Once | INTRAVENOUS | Status: AC
  Administered 2018-11-16: 10 mL
  Filled 2018-11-16: qty 10

## 2018-11-16 MED ORDER — SODIUM CHLORIDE 0.9% FLUSH
10.0000 mL | INTRAVENOUS | Status: DC | PRN
  Administered 2018-11-16: 10 mL
  Filled 2018-11-16: qty 10

## 2018-11-16 MED ORDER — FAMOTIDINE IN NACL 20-0.9 MG/50ML-% IV SOLN
20.0000 mg | Freq: Once | INTRAVENOUS | Status: AC
  Administered 2018-11-16: 10:00:00 20 mg via INTRAVENOUS

## 2018-11-16 MED ORDER — DIPHENHYDRAMINE HCL 50 MG/ML IJ SOLN
25.0000 mg | Freq: Once | INTRAMUSCULAR | Status: AC
  Administered 2018-11-16: 25 mg via INTRAVENOUS

## 2018-11-16 MED ORDER — DEXAMETHASONE SODIUM PHOSPHATE 10 MG/ML IJ SOLN
INTRAMUSCULAR | Status: AC
  Filled 2018-11-16: qty 1

## 2018-11-16 MED ORDER — HEPARIN SOD (PORK) LOCK FLUSH 100 UNIT/ML IV SOLN
500.0000 [IU] | Freq: Once | INTRAVENOUS | Status: AC | PRN
  Administered 2018-11-16: 12:00:00 500 [IU]
  Filled 2018-11-16: qty 5

## 2018-11-16 NOTE — Patient Instructions (Signed)
[facility] Discharge Instructions for Patients Receiving Chemotherapy  Today you received the following chemotherapy agents :  Taxol.  To help prevent nausea and vomiting after your treatment, we encourage you to take your nausea medication as prescribed.   If you develop nausea and vomiting that is not controlled by your nausea medication, call the clinic.   BELOW ARE SYMPTOMS THAT SHOULD BE REPORTED IMMEDIATELY:  *FEVER GREATER THAN 100.5 F  *CHILLS WITH OR WITHOUT FEVER  NAUSEA AND VOMITING THAT IS NOT CONTROLLED WITH YOUR NAUSEA MEDICATION  *UNUSUAL SHORTNESS OF BREATH  *UNUSUAL BRUISING OR BLEEDING  TENDERNESS IN MOUTH AND THROAT WITH OR WITHOUT PRESENCE OF ULCERS  *URINARY PROBLEMS  *BOWEL PROBLEMS  UNUSUAL RASH Items with * indicate a potential emergency and should be followed up as soon as possible.  Feel free to call the clinic should you have any questions or concerns. The clinic phone number is [phone].  Please show the CHEMO ALERT CARD at check-in to the Emergency Department and triage nurse.   

## 2018-11-16 NOTE — Progress Notes (Signed)
[facility] Follow up:    Charlene Olden, MD [address] [place] [address]   DIAGNOSIS: Cancer Staging Malignant neoplasm of upper-outer quadrant of right breast in female, estrogen receptor positive ([place]) Staging form: Breast, AJCC 8th Edition - Clinical: Stage IB (cT1c, cN1, cM0, G2, ER+, PR+, HER2-) - Signed by Gardenia Phlegm, NP on 12/23/2017 - Pathologic: Stage IB (pT3, pN2, cM0, G2, ER+, PR+, HER2-) - Unsigned   SUMMARY OF ONCOLOGIC HISTORY:   Malignant neoplasm of upper-outer quadrant of right breast in female, estrogen receptor positive ([place])   11/12/2017 Initial Diagnosis    Palpable lesion in the right breast upper outer quadrant mammogram and ultrasound revealed 1.4 cm tumor ultrasound-guided biopsy revealed grade 2 IDC ER 3+, PR 3+, HER-2 negative with a Ki-67 of 10%.  Right axillary lymph node biopsy positive, T1cN1 stage I a clinical stage    12/23/2017 Cancer Staging    Staging form: Breast, AJCC 8th Edition - Clinical: Stage IB (cT1c, cN1, cM0, G2, ER+, PR+, HER2-) - Signed by Gardenia Phlegm, NP on 12/23/2017    01/10/2018 Breast MRI    Biopsy-proven malignancy UOQ right breast measuring 6 cm.  Anterior inferior to this is a 1 cm mass which needs biopsy.  Focal non-mass enhancement LOQ retroareolar right breast, left breast 7 mm irregular enhancement, 2 suspicious right axillary lymph nodes    01/27/2018 Procedure    Right breast biopsy UOQ: Grade 1 ILC, ALH Right breast biopsy 6:00: LCIS Left breast biopsy: Benign    06/21/2018 Surgery    Rt Mastectomy: 7 cm fibrotic mass ILC Grade 2, 5/6 LN Positive, ER 90-100%, PR 5%-70%, Ki 67 2-10%, Her 2 Neg, T3N2    07/13/2018 Surgery    Right axillary lymph node dissection: 3/10 lymph nodes positive making total number of lymph nodes 8/16    08/10/2018 -  Chemotherapy    Adjuvant chemotherapy with dose dense Adriamycin and Cytoxan x4 followed by Taxol weekly x12      CURRENT THERAPY: Taxol Weekly  INTERVAL HISTORY: Charlene Perry 68 y.o. female returns for evaluation prior to receiving her sixth dose of weekly Taxol.  She is doing moderately well.  She notes that she is increasingly fatigued.  She says that she starts to feel better the Monday prior to treatment.  She also has increasing achiness in her lower back that radiates down into her legs.  She denies any peripheral neuropathy in her fingertips and toes.     Patient Active Problem List   Diagnosis Date Noted  . Port-A-Cath in place 08/24/2018  . Breast cancer, right ([place]) 06/21/2018  . Genetic testing 01/14/2018  . Family history of colon cancer   . Family history of breast cancer   . Malignant neoplasm of upper-outer quadrant of right breast in female, estrogen receptor positive ([place]) 12/15/2017    has No Known Allergies.  MEDICAL HISTORY: Past Medical History:  Diagnosis Date  . Arthritis   . Breast cancer in female [facility])    Right  . Cancer ([place])   . Diabetes mellitus without complication ([place])   . Family history of breast cancer   . Family history of colon cancer   . Hypertension    takes fluid pills  . Hypothyroidism   . Pink eye disease of left eye    went to eye md and received drops - yesterday.  . Sleep apnea    back in the 1990's  . Vitiligo  SURGICAL HISTORY: Past Surgical History:  Procedure Laterality Date  . AXILLARY LYMPH NODE DISSECTION Right 07/13/2018   Procedure: RIGHT AXILLARY LYMPH NODE DISSECTION;  Surgeon: [name], MD;  Location: [facility];  Service: General;  Laterality: Right;  . BREAST SURGERY     left breast fibro adenoma  . CATARACT EXTRACTION Right 2019  . CERVICAL FUSION N/A    C5 &6  . CESAREAN SECTION    . CHOLECYSTECTOMY  2004-2005  . CYSTECTOMY     patient denies  . EYE SURGERY    . IR IMAGING GUIDED PORT INSERTION  07/29/2018  . MASTECTOMY WITH RADIOACTIVE SEED GUIDED EXCISION AND AXILLARY SENTINEL LYMPH NODE  BIOPSY Right 06/21/2018  . MASTECTOMY WITH RADIOACTIVE SEED GUIDED EXCISION AND AXILLARY SENTINEL LYMPH NODE BIOPSY Right 06/21/2018   Procedure: RIGHT TOTAL MASTECTOMY WITH RIGHT AXILLARY SENTINEL NODE BIOPSY AND RIGHT SEED GUIDED NODE EXCISION;  Surgeon: [name], MD;  Location: [facility];  Service: General;  Laterality: Right;  . MYOMECTOMY  1986  . PARTIAL HYSTERECTOMY  1995  . PILONIDAL CYST EXCISION    . PORTACATH PLACEMENT N/A 07/13/2018   Procedure: INSERTION PORT-A-CATH WITH ULTRASOUND;  Surgeon: [name], MD;  Location: [facility];  Service: General;  Laterality: N/A;  . TONSILLECTOMY  2002  . UVULOPALATOPHARYNGOPLASTY, TONSILLECTOMY AND SEPTOPLASTY      SOCIAL HISTORY: Social History   Socioeconomic History  . Marital status: Married    Spouse name: Not on file  . Number of children: Not on file  . Years of education: Not on file  . Highest education level: Not on file  Occupational History  . Not on file  Social Needs  . Financial resource strain: Not on file  . Food insecurity:    Worry: Not on file    Inability: Not on file  . Transportation needs:    Medical: Not on file    Non-medical: Not on file  Tobacco Use  . Smoking status: Never Smoker  . Smokeless tobacco: Never Used  Substance and Sexual Activity  . Alcohol use: Not Currently  . Drug use: Never  . Sexual activity: Not on file  Lifestyle  . Physical activity:    Days per week: Not on file    Minutes per session: Not on file  . Stress: Not on file  Relationships  . Social connections:    Talks on phone: Not on file    Gets together: Not on file    Attends religious service: Not on file    Active member of club or organization: Not on file    Attends meetings of clubs or organizations: Not on file    Relationship status: Not on file  . Intimate partner violence:    Fear of current or ex partner: Not on file    Emotionally abused: Not on file    Physically abused: Not on file     Forced sexual activity: Not on file  Other Topics Concern  . Not on file  Social History Narrative  . Not on file    FAMILY HISTORY: Family History  Problem Relation Age of Onset  . Colon cancer Mother 34  . Colon cancer Sister 28  . Colon cancer Brother        ex early 51's  . Breast cancer Other        40's    Review of Systems  Constitutional: Positive for fatigue. Negative for appetite change, chills, fever and unexpected weight change.  HENT:  Negative for hearing loss, lump/mass, mouth sores, sore throat and voice change.   Eyes: Negative for eye problems and icterus.  Respiratory: Negative for chest tightness, cough and shortness of breath.   Cardiovascular: Negative for chest pain, leg swelling and palpitations.  Gastrointestinal: Negative for abdominal distention, abdominal pain, constipation, diarrhea, nausea and vomiting.  Endocrine: Negative for hot flashes.  Genitourinary: Negative for difficulty urinating.   Musculoskeletal: Negative for arthralgias.  Skin: Negative for itching and rash.  Neurological: Negative for dizziness, extremity weakness, headaches and numbness.  Hematological: Negative for adenopathy. Does not bruise/bleed easily.  Psychiatric/Behavioral: Negative for depression. The patient is not nervous/anxious.       PHYSICAL EXAMINATION  ECOG PERFORMANCE STATUS: 1 - Symptomatic but completely ambulatory  Vitals:   11/16/18 1142  BP: (!) 165/84  Pulse: (!) 102  Resp: 18  Temp: 98.5 F (36.9 C)  SpO2: 97%    Physical Exam Constitutional:      General: She is not in acute distress.    Appearance: Normal appearance.  HENT:     Head: Normocephalic and atraumatic.     Nose: Nose normal.     Mouth/Throat:     Mouth: Mucous membranes are moist.     Pharynx: Oropharynx is clear. No oropharyngeal exudate or posterior oropharyngeal erythema.  Eyes:     General: No scleral icterus.    Pupils: Pupils are equal, round, and reactive to light.   Neck:     Musculoskeletal: Neck supple.  Cardiovascular:     Rate and Rhythm: Normal rate and regular rhythm.     Pulses: Normal pulses.     Heart sounds: Normal heart sounds.  Pulmonary:     Effort: Pulmonary effort is normal.     Breath sounds: Normal breath sounds.  Abdominal:     General: Abdomen is flat. There is no distension.     Palpations: Abdomen is soft.     Tenderness: There is no abdominal tenderness.  Musculoskeletal:        General: No swelling.  Skin:    General: Skin is warm and dry.     Capillary Refill: Capillary refill takes less than 2 seconds.     Findings: No rash.  Neurological:     General: No focal deficit present.     Mental Status: She is alert.  Psychiatric:        Mood and Affect: Mood normal.        Behavior: Behavior normal.     LABORATORY DATA:  CBC    Component Value Date/Time   WBC 16.5 (H) 11/16/2018 0902   WBC 6.1 07/29/2018 0836   RBC 3.62 (L) 11/16/2018 0902   HGB 10.8 (L) 11/16/2018 0902   HCT 33.7 (L) 11/16/2018 0902   PLT 161 11/16/2018 0902   MCV 93.1 11/16/2018 0902   MCH 29.8 11/16/2018 0902   MCHC 32.0 11/16/2018 0902   RDW 17.5 (H) 11/16/2018 0902   LYMPHSABS 0.9 11/16/2018 0902   MONOABS 0.7 11/16/2018 0902   EOSABS 0.1 11/16/2018 0902   BASOSABS 0.1 11/16/2018 0902    CMP     Component Value Date/Time   NA 143 11/16/2018 0902   K 3.5 11/16/2018 0902   CL 104 11/16/2018 0902   CO2 29 11/16/2018 0902   GLUCOSE 163 (H) 11/16/2018 0902   BUN 16 11/16/2018 0902   CREATININE 1.03 (H) 11/16/2018 0902   CALCIUM 9.1 11/16/2018 0902   PROT 7.1 11/16/2018 0902   ALBUMIN 4.0 11/16/2018   0902   AST 25 11/16/2018 0902   ALT 31 11/16/2018 0902   ALKPHOS 112 11/16/2018 0902   BILITOT 0.3 11/16/2018 0902   GFRNONAA 57 (L) 11/16/2018 0902   GFRAA >60 11/16/2018 0902     ASSESSMENT and PLAN:   Malignant neoplasm of upper-outer quadrant of right breast in female, estrogen receptor positive ([facility]) 06/21/17:Rt  Mastectomy: 7 cm fibrotic mass ILC Grade 2, 5/6 LN Positive, ER 90-100%, PR 5%-70%, Ki 67 2-10%, Her 2 Neg, T3N2 Treatment plan: 1. Adj chemo 2. Adj RT 3. Adj Anti estrogen therapy  CT CAP: 07/02/2018: Subtle hypoattenuating lesion upper pole left kidney could be a cyst, urology consulted, left adrenal adenoma. --------------------------------------------------------------------------------------------------------------------- Current treatment:Completed 4 cycles ofdose dense Adriamycin and Cytoxan, Today is cycle 6 Taxol Labs reviewed Echocardiogram: 07/02/2018: EF 60 to 65%  Chemo toxicities: 1.Diarrhea: essentially resolved 2.Mild nausea: controlled 3.Fatigue: managing well.  4.Leukopenia:Taxol dose reduced to 60mg/m2, receives Granix 480mcg Saturday prior to treatment, due to WBC today and achiness, will reduce to 300mcg (reviewed with VG) 5.  Mucositis:  Resolved.  Monitoring closely for peripheral neuropathy. Return to clinic weekly for Taxol and every other week for follow-up with myself or Dr. Gudena.   All questions were answered. The patient knows to call the clinic with any problems, questions or concerns. We can certainly see the patient much sooner if necessary.  A total of (30) minutes of face-to-face time was spent with this patient with greater than 50% of that time in counseling and care-coordination.  This note was electronically signed. Lindsey C Causey, NP 11/16/2018 

## 2018-11-16 NOTE — Assessment & Plan Note (Addendum)
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 Adj chemo 2. Adj RT 3. Adj Anti estrogen therapy  CT CAP: 07/02/2018: Subtle hypoattenuating lesion upper pole left kidney could be a cyst, urology consulted, left adrenal adenoma. --------------------------------------------------------------------------------------------------------------------- Current treatment:Completed 4 cycles ofdose dense Adriamycin and Cytoxan, Today is cycle 6 Taxol Labs reviewed Echocardiogram: 07/02/2018: EF 60 to 65%  Chemo toxicities: 1.Diarrhea: essentially resolved 2.Mild nausea: controlled 3.Fatigue: managing well.  4.Leukopenia:Taxol dose reduced to 60mg /m2, receives Granix 426mcg Saturday prior to treatment, due to WBC today and achiness, will reduce to 375mcg (reviewed with VG) 5.  Mucositis: Resolved.  Monitoring closely for peripheral neuropathy. Return to clinic weekly for Taxol and every other week for follow-up with myself or Dr. Lindi Adie.

## 2018-11-20 ENCOUNTER — Inpatient Hospital Stay: Payer: BC Managed Care – PPO

## 2018-11-20 ENCOUNTER — Other Ambulatory Visit: Payer: Self-pay

## 2018-11-20 VITALS — BP 148/86 | HR 109 | Temp 98.0°F | Resp 14

## 2018-11-20 DIAGNOSIS — C50011 Malignant neoplasm of nipple and areola, right female breast: Secondary | ICD-10-CM

## 2018-11-20 DIAGNOSIS — Z5189 Encounter for other specified aftercare: Secondary | ICD-10-CM | POA: Diagnosis not present

## 2018-11-20 DIAGNOSIS — Z95828 Presence of other vascular implants and grafts: Secondary | ICD-10-CM

## 2018-11-20 MED ORDER — TBO-FILGRASTIM 300 MCG/0.5ML ~~LOC~~ SOSY
300.0000 ug | PREFILLED_SYRINGE | Freq: Once | SUBCUTANEOUS | Status: AC
  Administered 2018-11-20: 300 ug via SUBCUTANEOUS

## 2018-11-20 MED ORDER — TBO-FILGRASTIM 300 MCG/0.5ML ~~LOC~~ SOSY
PREFILLED_SYRINGE | SUBCUTANEOUS | Status: AC
  Filled 2018-11-20: qty 0.5

## 2018-11-20 NOTE — Patient Instructions (Signed)
Tbo-Filgrastim injection What is this medicine? TBO-FILGRASTIM (T B O fil GRA stim) is a granulocyte colony-stimulating factor that stimulates the growth of neutrophils, a type of white blood cell important in the body's fight against infection. It is used to reduce the incidence of fever and infection in patients with certain types of cancer who are receiving chemotherapy that affects the bone marrow. This medicine may be used for other purposes; ask your health care provider or pharmacist if you have questions. COMMON BRAND NAME(S): Granix What should I tell my health care provider before I take this medicine? They need to know if you have any of these conditions: -bone scan or tests planned -kidney disease -sickle cell anemia -an unusual or allergic reaction to tbo-filgrastim, filgrastim, pegfilgrastim, other medicines, foods, dyes, or preservatives -pregnant or trying to get pregnant -breast-feeding How should I use this medicine? This medicine is for injection under the skin. If you get this medicine at home, you will be taught how to prepare and give this medicine. Refer to the Instructions for Use that come with your medication packaging. Use exactly as directed. Take your medicine at regular intervals. Do not take your medicine more often than directed. It is important that you put your used needles and syringes in a special sharps container. Do not put them in a trash can. If you do not have a sharps container, call your pharmacist or healthcare provider to get one. Talk to your pediatrician regarding the use of this medicine in children. While this drug may be prescribed for children as young as 1 month of age for selected conditions, precautions do apply. Overdosage: If you think you have taken too much of this medicine contact a poison control center or emergency room at once. NOTE: This medicine is only for you. Do not share this medicine with others. What if I miss a dose? It is  important not to miss your dose. Call your doctor or health care professional if you miss a dose. What may interact with this medicine? This medicine may interact with the following medications: -medicines that may cause a release of neutrophils, such as lithium This list may not describe all possible interactions. Give your health care provider a list of all the medicines, herbs, non-prescription drugs, or dietary supplements you use. Also tell them if you smoke, drink alcohol, or use illegal drugs. Some items may interact with your medicine. What should I watch for while using this medicine? You may need blood work done while you are taking this medicine. What side effects may I notice from receiving this medicine? Side effects that you should report to your doctor or health care professional as soon as possible: -allergic reactions like skin rash, itching or hives, swelling of the face, lips, or tongue -back pain -blood in the urine -dark urine -dizziness -fast heartbeat -feeling faint -shortness of breath or breathing problems -signs and symptoms of infection like fever or chills; cough; or sore throat -signs and symptoms of kidney injury like trouble passing urine or change in the amount of urine -stomach or side pain, or pain at the shoulder -sweating -swelling of the legs, ankles, or abdomen -tiredness Side effects that usually do not require medical attention (report to your doctor or health care professional if they continue or are bothersome): -bone pain -diarrhea -headache -muscle pain -vomiting This list may not describe all possible side effects. Call your doctor for medical advice about side effects. You may report side effects to FDA at   1-800-FDA-1088. Where should I keep my medicine? Keep out of the reach of children. Store in a refrigerator between 2 and 8 degrees C (36 and 46 degrees F). Keep in carton to protect from light. Throw away this medicine if it is left out  of the refrigerator for more than 5 consecutive days. Throw away any unused medicine after the expiration date. NOTE: This sheet is a summary. It may not cover all possible information. If you have questions about this medicine, talk to your doctor, pharmacist, or health care provider.  2019 Elsevier/Gold Standard (2017-01-20 16:56:18)  

## 2018-11-22 NOTE — Assessment & Plan Note (Signed)
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 Adj chemo 2. Adj RT 3. Adj Anti estrogen therapy  CT CAP: 07/02/2018: Subtle hypoattenuating lesion upper pole left kidney could be a cyst, urology consulted, left adrenal adenoma. --------------------------------------------------------------------------------------------------------------------- Current treatment:Completed 4 cycles ofdose dense Adriamycin and Cytoxan, Today is cycle8Taxol Labs reviewed Echocardiogram: 07/02/2018: EF 60 to 65%  Chemo toxicities: 1.Diarrhea: essentially resolved 2.Mild nausea: controlled 3.Fatigue: managing well.  4.Leukopenia:Taxol dose reduced to 60mg /m2, receives Granix 455mcg Saturday prior to treatment, due to WBC today and achiness, will reduce to 337mcg (reviewed with VG) 5. Mucositis:  Resolved.  Monitoring closely for peripheral neuropathy. Return to clinic weekly for Taxol and every other week for follow-up

## 2018-11-23 ENCOUNTER — Other Ambulatory Visit: Payer: Self-pay

## 2018-11-23 ENCOUNTER — Inpatient Hospital Stay: Payer: BC Managed Care – PPO

## 2018-11-23 VITALS — BP 143/78 | HR 98 | Temp 98.7°F | Resp 18

## 2018-11-23 DIAGNOSIS — C50411 Malignant neoplasm of upper-outer quadrant of right female breast: Secondary | ICD-10-CM

## 2018-11-23 DIAGNOSIS — C50011 Malignant neoplasm of nipple and areola, right female breast: Secondary | ICD-10-CM

## 2018-11-23 DIAGNOSIS — Z5189 Encounter for other specified aftercare: Secondary | ICD-10-CM | POA: Diagnosis not present

## 2018-11-23 DIAGNOSIS — Z95828 Presence of other vascular implants and grafts: Secondary | ICD-10-CM

## 2018-11-23 LAB — CBC WITH DIFFERENTIAL (CANCER CENTER ONLY)
Abs Immature Granulocytes: 0.11 10*3/uL — ABNORMAL HIGH (ref 0.00–0.07)
Basophils Absolute: 0 10*3/uL (ref 0.0–0.1)
Basophils Relative: 0 %
Eosinophils Absolute: 0.1 10*3/uL (ref 0.0–0.5)
Eosinophils Relative: 1 %
HCT: 30.6 % — ABNORMAL LOW (ref 36.0–46.0)
Hemoglobin: 9.9 g/dL — ABNORMAL LOW (ref 12.0–15.0)
Immature Granulocytes: 1 %
Lymphocytes Relative: 8 %
Lymphs Abs: 0.7 10*3/uL (ref 0.7–4.0)
MCH: 30.2 pg (ref 26.0–34.0)
MCHC: 32.4 g/dL (ref 30.0–36.0)
MCV: 93.3 fL (ref 80.0–100.0)
Monocytes Absolute: 0.5 10*3/uL (ref 0.1–1.0)
Monocytes Relative: 6 %
Neutro Abs: 8 10*3/uL — ABNORMAL HIGH (ref 1.7–7.7)
Neutrophils Relative %: 84 %
Platelet Count: 189 10*3/uL (ref 150–400)
RBC: 3.28 MIL/uL — ABNORMAL LOW (ref 3.87–5.11)
RDW: 16.9 % — ABNORMAL HIGH (ref 11.5–15.5)
WBC Count: 9.4 10*3/uL (ref 4.0–10.5)
nRBC: 0 % (ref 0.0–0.2)

## 2018-11-23 LAB — CMP (CANCER CENTER ONLY)
ALT: 17 U/L (ref 0–44)
AST: 14 U/L — ABNORMAL LOW (ref 15–41)
Albumin: 3.8 g/dL (ref 3.5–5.0)
Alkaline Phosphatase: 88 U/L (ref 38–126)
Anion gap: 12 (ref 5–15)
BUN: 17 mg/dL (ref 8–23)
CO2: 24 mmol/L (ref 22–32)
Calcium: 8.9 mg/dL (ref 8.9–10.3)
Chloride: 106 mmol/L (ref 98–111)
Creatinine: 1.03 mg/dL — ABNORMAL HIGH (ref 0.44–1.00)
GFR, Est AFR Am: >60 mL/min (ref >60)
GFR, Estimated: 57 mL/min — ABNORMAL LOW (ref >60)
Glucose, Bld: 209 mg/dL — ABNORMAL HIGH (ref 70–99)
Potassium: 3.9 mmol/L (ref 3.5–5.1)
Sodium: 142 mmol/L (ref 135–145)
Total Bilirubin: 0.3 mg/dL (ref 0.3–1.2)
Total Protein: 6.7 g/dL (ref 6.5–8.1)

## 2018-11-23 MED ORDER — HEPARIN SOD (PORK) LOCK FLUSH 100 UNIT/ML IV SOLN
500.0000 [IU] | Freq: Once | INTRAVENOUS | Status: AC | PRN
  Administered 2018-11-23: 500 [IU]
  Filled 2018-11-23: qty 5

## 2018-11-23 MED ORDER — DIPHENHYDRAMINE HCL 50 MG/ML IJ SOLN
25.0000 mg | Freq: Once | INTRAMUSCULAR | Status: AC
  Administered 2018-11-23: 25 mg via INTRAVENOUS

## 2018-11-23 MED ORDER — DEXAMETHASONE SODIUM PHOSPHATE 10 MG/ML IJ SOLN
INTRAMUSCULAR | Status: AC
  Filled 2018-11-23: qty 1

## 2018-11-23 MED ORDER — SODIUM CHLORIDE 0.9% FLUSH
10.0000 mL | INTRAVENOUS | Status: DC | PRN
  Administered 2018-11-23: 10 mL
  Filled 2018-11-23: qty 10

## 2018-11-23 MED ORDER — FAMOTIDINE IN NACL 20-0.9 MG/50ML-% IV SOLN
20.0000 mg | Freq: Once | INTRAVENOUS | Status: AC
  Administered 2018-11-23: 20 mg via INTRAVENOUS

## 2018-11-23 MED ORDER — SODIUM CHLORIDE 0.9 % IV SOLN
Freq: Once | INTRAVENOUS | Status: AC
  Administered 2018-11-23: 09:00:00 via INTRAVENOUS
  Filled 2018-11-23: qty 250

## 2018-11-23 MED ORDER — DEXAMETHASONE SODIUM PHOSPHATE 10 MG/ML IJ SOLN
10.0000 mg | Freq: Once | INTRAMUSCULAR | Status: AC
  Administered 2018-11-23: 10 mg via INTRAVENOUS

## 2018-11-23 MED ORDER — SODIUM CHLORIDE 0.9 % IV SOLN
60.0000 mg/m2 | Freq: Once | INTRAVENOUS | Status: AC
  Administered 2018-11-23: 132 mg via INTRAVENOUS
  Filled 2018-11-23: qty 22

## 2018-11-23 MED ORDER — DIPHENHYDRAMINE HCL 50 MG/ML IJ SOLN
INTRAMUSCULAR | Status: AC
  Filled 2018-11-23: qty 1

## 2018-11-23 MED ORDER — SODIUM CHLORIDE 0.9% FLUSH
10.0000 mL | Freq: Once | INTRAVENOUS | Status: AC
  Administered 2018-11-23: 08:00:00 10 mL
  Filled 2018-11-23: qty 10

## 2018-11-23 MED ORDER — FAMOTIDINE IN NACL 20-0.9 MG/50ML-% IV SOLN
INTRAVENOUS | Status: AC
  Filled 2018-11-23: qty 50

## 2018-11-23 NOTE — Patient Instructions (Signed)
[facility] Discharge Instructions for Patients Receiving Chemotherapy  Today you received the following chemotherapy agents :  Taxol.  To help prevent nausea and vomiting after your treatment, we encourage you to take your nausea medication as prescribed.   If you develop nausea and vomiting that is not controlled by your nausea medication, call the clinic.   BELOW ARE SYMPTOMS THAT SHOULD BE REPORTED IMMEDIATELY:  *FEVER GREATER THAN 100.5 F  *CHILLS WITH OR WITHOUT FEVER  NAUSEA AND VOMITING THAT IS NOT CONTROLLED WITH YOUR NAUSEA MEDICATION  *UNUSUAL SHORTNESS OF BREATH  *UNUSUAL BRUISING OR BLEEDING  TENDERNESS IN MOUTH AND THROAT WITH OR WITHOUT PRESENCE OF ULCERS  *URINARY PROBLEMS  *BOWEL PROBLEMS  UNUSUAL RASH Items with * indicate a potential emergency and should be followed up as soon as possible.  Feel free to call the clinic should you have any questions or concerns. The clinic phone number is [phone].  Please show the CHEMO ALERT CARD at check-in to the Emergency Department and triage nurse.   

## 2018-11-27 ENCOUNTER — Other Ambulatory Visit: Payer: Self-pay

## 2018-11-27 ENCOUNTER — Inpatient Hospital Stay: Payer: BC Managed Care – PPO

## 2018-11-27 VITALS — BP 135/93 | HR 100 | Temp 97.2°F | Resp 18

## 2018-11-27 DIAGNOSIS — Z95828 Presence of other vascular implants and grafts: Secondary | ICD-10-CM

## 2018-11-27 DIAGNOSIS — C50011 Malignant neoplasm of nipple and areola, right female breast: Secondary | ICD-10-CM

## 2018-11-27 DIAGNOSIS — Z5189 Encounter for other specified aftercare: Secondary | ICD-10-CM | POA: Diagnosis not present

## 2018-11-27 MED ORDER — TBO-FILGRASTIM 300 MCG/0.5ML ~~LOC~~ SOSY
300.0000 ug | PREFILLED_SYRINGE | Freq: Once | SUBCUTANEOUS | Status: AC
  Administered 2018-11-27: 12:00:00 300 ug via SUBCUTANEOUS

## 2018-11-27 MED ORDER — TBO-FILGRASTIM 300 MCG/0.5ML ~~LOC~~ SOSY
PREFILLED_SYRINGE | SUBCUTANEOUS | Status: AC
  Filled 2018-11-27: qty 0.5

## 2018-11-27 NOTE — Patient Instructions (Signed)
Tbo-Filgrastim injection What is this medicine? TBO-FILGRASTIM (T B O fil GRA stim) is a granulocyte colony-stimulating factor that stimulates the growth of neutrophils, a type of white blood cell important in the body's fight against infection. It is used to reduce the incidence of fever and infection in patients with certain types of cancer who are receiving chemotherapy that affects the bone marrow. This medicine may be used for other purposes; ask your health care provider or pharmacist if you have questions. COMMON BRAND NAME(S): Granix What should I tell my health care provider before I take this medicine? They need to know if you have any of these conditions: -bone scan or tests planned -kidney disease -sickle cell anemia -an unusual or allergic reaction to tbo-filgrastim, filgrastim, pegfilgrastim, other medicines, foods, dyes, or preservatives -pregnant or trying to get pregnant -breast-feeding How should I use this medicine? This medicine is for injection under the skin. If you get this medicine at home, you will be taught how to prepare and give this medicine. Refer to the Instructions for Use that come with your medication packaging. Use exactly as directed. Take your medicine at regular intervals. Do not take your medicine more often than directed. It is important that you put your used needles and syringes in a special sharps container. Do not put them in a trash can. If you do not have a sharps container, call your pharmacist or healthcare provider to get one. Talk to your pediatrician regarding the use of this medicine in children. While this drug may be prescribed for children as young as 1 month of age for selected conditions, precautions do apply. Overdosage: If you think you have taken too much of this medicine contact a poison control center or emergency room at once. NOTE: This medicine is only for you. Do not share this medicine with others. What if I miss a dose? It is  important not to miss your dose. Call your doctor or health care professional if you miss a dose. What may interact with this medicine? This medicine may interact with the following medications: -medicines that may cause a release of neutrophils, such as lithium This list may not describe all possible interactions. Give your health care provider a list of all the medicines, herbs, non-prescription drugs, or dietary supplements you use. Also tell them if you smoke, drink alcohol, or use illegal drugs. Some items may interact with your medicine. What should I watch for while using this medicine? You may need blood work done while you are taking this medicine. What side effects may I notice from receiving this medicine? Side effects that you should report to your doctor or health care professional as soon as possible: -allergic reactions like skin rash, itching or hives, swelling of the face, lips, or tongue -back pain -blood in the urine -dark urine -dizziness -fast heartbeat -feeling faint -shortness of breath or breathing problems -signs and symptoms of infection like fever or chills; cough; or sore throat -signs and symptoms of kidney injury like trouble passing urine or change in the amount of urine -stomach or side pain, or pain at the shoulder -sweating -swelling of the legs, ankles, or abdomen -tiredness Side effects that usually do not require medical attention (report to your doctor or health care professional if they continue or are bothersome): -bone pain -diarrhea -headache -muscle pain -vomiting This list may not describe all possible side effects. Call your doctor for medical advice about side effects. You may report side effects to FDA at   1-800-FDA-1088. Where should I keep my medicine? Keep out of the reach of children. Store in a refrigerator between 2 and 8 degrees C (36 and 46 degrees F). Keep in carton to protect from light. Throw away this medicine if it is left out  of the refrigerator for more than 5 consecutive days. Throw away any unused medicine after the expiration date. NOTE: This sheet is a summary. It may not cover all possible information. If you have questions about this medicine, talk to your doctor, pharmacist, or health care provider.  2019 Elsevier/Gold Standard (2017-01-20 16:56:18)  

## 2018-11-29 NOTE — Progress Notes (Signed)
Patient Care Team: [name], MD as PCP - General (Family Medicine)  DIAGNOSIS:    ICD-10-CM   1. Malignant neoplasm of upper-outer quadrant of right breast in female, estrogen receptor positive ([place])  C50.411    Z17.0     SUMMARY OF ONCOLOGIC HISTORY: Oncology History  Malignant neoplasm of upper-outer quadrant of right breast in female, estrogen receptor positive ([place])  11/12/2017 Initial Diagnosis   Palpable lesion in the right breast upper outer quadrant mammogram and ultrasound revealed 1.4 cm tumor ultrasound-guided biopsy revealed grade 2 IDC ER 3+, PR 3+, HER-2 negative with a Ki-67 of 10%.  Right axillary lymph node biopsy positive, T1cN1 stage I a clinical stage   12/23/2017 Cancer Staging   Staging form: Breast, AJCC 8th Edition - Clinical: Stage IB (cT1c, cN1, cM0, G2, ER+, PR+, HER2-) - Signed by Gardenia Phlegm, NP on 12/23/2017   01/10/2018 Breast MRI   Biopsy-proven malignancy UOQ right breast measuring 6 cm.  Anterior inferior to this is a 1 cm mass which needs biopsy.  Focal non-mass enhancement LOQ retroareolar right breast, left breast 7 mm irregular enhancement, 2 suspicious right axillary lymph nodes   01/27/2018 Procedure   Right breast biopsy UOQ: Grade 1 ILC, ALH Right breast biopsy 6:00: LCIS Left breast biopsy: Benign   06/21/2018 Surgery   Rt Mastectomy: 7 cm fibrotic mass ILC Grade 2, 5/6 LN Positive, ER 90-100%, PR 5%-70%, Ki 67 2-10%, Her 2 Neg, T3N2   07/13/2018 Surgery   Right axillary lymph node dissection: 3/10 lymph nodes positive making total number of lymph nodes 8/16   08/10/2018 -  Chemotherapy   Adjuvant chemotherapy with dose dense Adriamycin and Cytoxan x4 followed by Taxol weekly x12     CHIEF COMPLIANT: Cycle 8 Taxol  INTERVAL HISTORY: Charlene Perry is a 68 y.o. with above-mentioned history of right breast cancer who underwent a right mastectomyandcompleted 4 cycles ofadjuvant chemotherapy with dose  dense Adriamycin and Cytoxan. She is currently receiving weekly Taxol treatments. She presents to the clinic today for cycle 8.   REVIEW OF SYSTEMS:   Constitutional: Denies fevers, chills or abnormal weight loss Eyes: Denies blurriness of vision Ears, nose, mouth, throat, and face: Denies mucositis or sore throat Respiratory: Denies cough, dyspnea or wheezes Cardiovascular: Denies palpitation, chest discomfort Gastrointestinal: Denies nausea, heartburn or change in bowel habits Skin: Denies abnormal skin rashes Lymphatics: Denies new lymphadenopathy or easy bruising Neurological: Denies numbness, tingling or new weaknesses Behavioral/Psych: Mood is stable, no new changes  Extremities: No lower extremity edema Breast: denies any pain or lumps or nodules in either breasts All other systems were reviewed with the patient and are negative.  I have reviewed the past medical history, past surgical history, social history and family history with the patient and they are unchanged from previous note.  ALLERGIES:  has No Known Allergies.  MEDICATIONS:  Current Outpatient Medications  Medication Sig Dispense Refill  . aspirin EC 81 MG tablet Take 81 mg by mouth daily.    . celecoxib (CELEBREX) 200 MG capsule Take 200 mg by mouth daily.    . cholecalciferol (VITAMIN D3) 25 MCG (1000 UT) tablet Take 1 tablet (1,000 Units total) by mouth daily.    . furosemide (LASIX) 40 MG tablet Take 80 mg by mouth daily.    Marland Kitchen gabapentin (NEURONTIN) 400 MG capsule Take 400 mg by mouth 2 (two) times daily.    Marland Kitchen levothyroxine (SYNTHROID, LEVOTHROID) 125 MCG tablet Take 125 mcg by mouth  daily before breakfast.    . lidocaine-prilocaine (EMLA) cream Apply to affected area once 30 g 3  . LORazepam (ATIVAN) 0.5 MG tablet Take 1 tablet (0.5 mg total) by mouth at bedtime as needed (Nausea or vomiting). 30 tablet 0  . magic mouthwash SOLN Take 5 mLs by mouth 4 (four) times daily as needed for mouth pain. 240 mL 2  .  metFORMIN (GLUCOPHAGE) 1000 MG tablet Take 1,000 mg by mouth 2 (two) times daily with a meal.    . ondansetron (ZOFRAN) 8 MG tablet Take 1 tablet (8 mg total) by mouth 2 (two) times daily as needed. Start on the third day after chemotherapy. 30 tablet 1  . pantoprazole (PROTONIX) 40 MG tablet Take 1 tablet (40 mg total) by mouth daily. 30 tablet 1  . potassium chloride (K-DUR,KLOR-CON) 10 MEQ tablet Take 20 mEq by mouth daily.     . prochlorperazine (COMPAZINE) 10 MG tablet Take 1 tablet (10 mg total) by mouth every 6 (six) hours as needed (Nausea or vomiting). 30 tablet 1  . rosuvastatin (CRESTOR) 40 MG tablet Take 40 mg by mouth at bedtime.     . valACYclovir (VALTREX) 500 MG tablet Take 1 tablet (500 mg total) by mouth 2 (two) times daily. 60 tablet 1   No current facility-administered medications for this visit.     PHYSICAL EXAMINATION: ECOG PERFORMANCE STATUS: 1 - Symptomatic but completely ambulatory  There were no vitals filed for this visit. There were no vitals filed for this visit.  GENERAL: alert, no distress and comfortable SKIN: skin color, texture, turgor are normal, no rashes or significant lesions EYES: normal, Conjunctiva are pink and non-injected, sclera clear OROPHARYNX: no exudate, no erythema and lips, buccal mucosa, and tongue normal  NECK: supple, thyroid normal size, non-tender, without nodularity LYMPH: no palpable lymphadenopathy in the cervical, axillary or inguinal LUNGS: clear to auscultation and percussion with normal breathing effort HEART: regular rate & rhythm and no murmurs and no lower extremity edema ABDOMEN: abdomen soft, non-tender and normal bowel sounds MUSCULOSKELETAL: no cyanosis of digits and no clubbing  NEURO: alert & oriented x 3 with fluent speech, no focal motor/sensory deficits EXTREMITIES: No lower extremity edema  LABORATORY DATA:  I have reviewed the data as listed CMP Latest Ref Rng & Units 11/23/2018 11/16/2018 11/09/2018  Glucose 70  - 99 mg/dL 209(H) 163(H) 161(H)  BUN 8 - 23 mg/dL _0 Creatinine 0.44 - 1.00 mg/dL 1.03(H) 1.03(H) 1.03(H)  Sodium 135 - 145 mmol/L 142 143 144  Potassium 3.5 - 5.1 mmol/L 3.9 3.5 3.2(L)  Chloride 98 - 111 mmol/L 106 104 106  CO2 22 - 32 mmol/L _1 Calcium 8.9 - 10.3 mg/dL 8.9 9.1 8.5(L)  Total Protein 6.5 - 8.1 g/dL 6.7 7.1 6.5  Total Bilirubin 0.3 - 1.2 mg/dL 0.3 0.3 0.3  Alkaline Phos 38 - 126 U/L 88 112 92  AST 15 - 41 U/L 14(L) 25 17  ALT 0 - 44 U/L _2 Lab Results  Component Value Date   WBC 9.4 11/23/2018   HGB 9.9 (L) 11/23/2018   HCT 30.6 (L) 11/23/2018   MCV 93.3 11/23/2018   PLT 189 11/23/2018   NEUTROABS 8.0 (H) 11/23/2018    ASSESSMENT & PLAN:  Malignant neoplasm of upper-outer quadrant of right breast in female, estrogen receptor positive ([place]) 06/21/17:Rt Mastectomy: 7 cm fibrotic mass ILC Grade 2, 5/6 LN Positive, ER 90-100%, PR 5%-70%, Ki 67  2-10%, Her 2 Neg, T3N2 Treatment plan: 1. Adj chemo 2. Adj RT 3. Adj Anti estrogen therapy  CT CAP: 07/02/2018: Subtle hypoattenuating lesion upper pole left kidney could be a cyst, urology consulted, left adrenal adenoma. --------------------------------------------------------------------------------------------------------------------- Current treatment:Completed 4 cycles ofdose dense Adriamycin and Cytoxan, Today is cycle8Taxolwith Granix support Labs reviewed Echocardiogram: 07/02/2018: EF 60 to 65%  Chemo toxicities: 1.Diarrhea: essentially resolved 2.Mild nausea: controlled 3.Fatigue: managing well.  4.Leukopenia:Taxol dose reduced to 47m/m2, receives Granix 482m Saturday prior to treatment, due to WBC today and achiness, will reduce to 30037m  5. Mucositis:  Resolved.  Monitoring closely for peripheral neuropathy. Return to clinic weekly for Taxol and every other week for follow-up  No orders of the defined types were placed in this encounter.  The patient has a  good understanding of the overall plan. she agrees with it. she will call with any problems that may develop before the next visit here.  GudNicholas LoseD 11/30/2018  I, Julious Okarshimer am acting as scribe for Dr. VinNicholas LoseI have reviewed the above documentation for accuracy and completeness, and I agree with the above.

## 2018-11-30 ENCOUNTER — Inpatient Hospital Stay: Payer: BC Managed Care – PPO

## 2018-11-30 ENCOUNTER — Encounter: Payer: Self-pay | Admitting: *Deleted

## 2018-11-30 ENCOUNTER — Other Ambulatory Visit: Payer: Self-pay

## 2018-11-30 ENCOUNTER — Inpatient Hospital Stay (HOSPITAL_BASED_OUTPATIENT_CLINIC_OR_DEPARTMENT_OTHER): Payer: BC Managed Care – PPO | Admitting: Hematology and Oncology

## 2018-11-30 DIAGNOSIS — Z7982 Long term (current) use of aspirin: Secondary | ICD-10-CM

## 2018-11-30 DIAGNOSIS — C50411 Malignant neoplasm of upper-outer quadrant of right female breast: Secondary | ICD-10-CM

## 2018-11-30 DIAGNOSIS — Z8249 Family history of ischemic heart disease and other diseases of the circulatory system: Secondary | ICD-10-CM

## 2018-11-30 DIAGNOSIS — Z800 Family history of malignant neoplasm of digestive organs: Secondary | ICD-10-CM

## 2018-11-30 DIAGNOSIS — Z5189 Encounter for other specified aftercare: Secondary | ICD-10-CM | POA: Diagnosis not present

## 2018-11-30 DIAGNOSIS — Z7984 Long term (current) use of oral hypoglycemic drugs: Secondary | ICD-10-CM

## 2018-11-30 DIAGNOSIS — R110 Nausea: Secondary | ICD-10-CM

## 2018-11-30 DIAGNOSIS — D701 Agranulocytosis secondary to cancer chemotherapy: Secondary | ICD-10-CM | POA: Diagnosis not present

## 2018-11-30 DIAGNOSIS — C50011 Malignant neoplasm of nipple and areola, right female breast: Secondary | ICD-10-CM

## 2018-11-30 DIAGNOSIS — Z170 Estrogen receptor positive status [ER+]: Secondary | ICD-10-CM | POA: Diagnosis not present

## 2018-11-30 DIAGNOSIS — Z803 Family history of malignant neoplasm of breast: Secondary | ICD-10-CM

## 2018-11-30 DIAGNOSIS — Z95828 Presence of other vascular implants and grafts: Secondary | ICD-10-CM

## 2018-11-30 DIAGNOSIS — Z791 Long term (current) use of non-steroidal anti-inflammatories (NSAID): Secondary | ICD-10-CM

## 2018-11-30 DIAGNOSIS — Z79899 Other long term (current) drug therapy: Secondary | ICD-10-CM

## 2018-11-30 DIAGNOSIS — E119 Type 2 diabetes mellitus without complications: Secondary | ICD-10-CM

## 2018-11-30 DIAGNOSIS — R5383 Other fatigue: Secondary | ICD-10-CM | POA: Diagnosis not present

## 2018-11-30 LAB — CBC WITH DIFFERENTIAL (CANCER CENTER ONLY)
Abs Immature Granulocytes: 0.24 10*3/uL — ABNORMAL HIGH (ref 0.00–0.07)
Basophils Absolute: 0 10*3/uL (ref 0.0–0.1)
Basophils Relative: 0 %
Eosinophils Absolute: 0.2 10*3/uL (ref 0.0–0.5)
Eosinophils Relative: 2 %
HCT: 32 % — ABNORMAL LOW (ref 36.0–46.0)
Hemoglobin: 10.1 g/dL — ABNORMAL LOW (ref 12.0–15.0)
Immature Granulocytes: 2 %
Lymphocytes Relative: 7 %
Lymphs Abs: 0.7 10*3/uL (ref 0.7–4.0)
MCH: 30 pg (ref 26.0–34.0)
MCHC: 31.6 g/dL (ref 30.0–36.0)
MCV: 95 fL (ref 80.0–100.0)
Monocytes Absolute: 0.5 10*3/uL (ref 0.1–1.0)
Monocytes Relative: 5 %
Neutro Abs: 8.5 10*3/uL — ABNORMAL HIGH (ref 1.7–7.7)
Neutrophils Relative %: 84 %
Platelet Count: 249 10*3/uL (ref 150–400)
RBC: 3.37 MIL/uL — ABNORMAL LOW (ref 3.87–5.11)
RDW: 16.5 % — ABNORMAL HIGH (ref 11.5–15.5)
WBC Count: 10.2 10*3/uL (ref 4.0–10.5)
nRBC: 0 % (ref 0.0–0.2)

## 2018-11-30 LAB — CMP (CANCER CENTER ONLY)
ALT: 21 U/L (ref 0–44)
AST: 15 U/L (ref 15–41)
Albumin: 3.8 g/dL (ref 3.5–5.0)
Alkaline Phosphatase: 93 U/L (ref 38–126)
Anion gap: 12 (ref 5–15)
BUN: 11 mg/dL (ref 8–23)
CO2: 27 mmol/L (ref 22–32)
Calcium: 8.4 mg/dL — ABNORMAL LOW (ref 8.9–10.3)
Chloride: 105 mmol/L (ref 98–111)
Creatinine: 0.95 mg/dL (ref 0.44–1.00)
GFR, Est AFR Am: >60 mL/min (ref >60)
GFR, Estimated: >60 mL/min (ref >60)
Glucose, Bld: 157 mg/dL — ABNORMAL HIGH (ref 70–99)
Potassium: 3.8 mmol/L (ref 3.5–5.1)
Sodium: 144 mmol/L (ref 135–145)
Total Bilirubin: 0.4 mg/dL (ref 0.3–1.2)
Total Protein: 6.7 g/dL (ref 6.5–8.1)

## 2018-11-30 MED ORDER — DEXAMETHASONE SODIUM PHOSPHATE 10 MG/ML IJ SOLN
10.0000 mg | Freq: Once | INTRAMUSCULAR | Status: AC
  Administered 2018-11-30: 10 mg via INTRAVENOUS

## 2018-11-30 MED ORDER — SODIUM CHLORIDE 0.9% FLUSH
10.0000 mL | INTRAVENOUS | Status: DC | PRN
  Administered 2018-11-30: 10 mL
  Filled 2018-11-30: qty 10

## 2018-11-30 MED ORDER — DIPHENHYDRAMINE HCL 50 MG/ML IJ SOLN
INTRAMUSCULAR | Status: AC
  Filled 2018-11-30: qty 1

## 2018-11-30 MED ORDER — FAMOTIDINE IN NACL 20-0.9 MG/50ML-% IV SOLN
20.0000 mg | Freq: Once | INTRAVENOUS | Status: AC
  Administered 2018-11-30: 20 mg via INTRAVENOUS

## 2018-11-30 MED ORDER — SODIUM CHLORIDE 0.9 % IV SOLN
Freq: Once | INTRAVENOUS | Status: AC
  Administered 2018-11-30: 09:00:00 via INTRAVENOUS
  Filled 2018-11-30: qty 250

## 2018-11-30 MED ORDER — SODIUM CHLORIDE 0.9% FLUSH
10.0000 mL | Freq: Once | INTRAVENOUS | Status: AC
  Administered 2018-11-30: 10 mL
  Filled 2018-11-30: qty 10

## 2018-11-30 MED ORDER — DIPHENHYDRAMINE HCL 50 MG/ML IJ SOLN
25.0000 mg | Freq: Once | INTRAMUSCULAR | Status: AC
  Administered 2018-11-30: 25 mg via INTRAVENOUS

## 2018-11-30 MED ORDER — HEPARIN SOD (PORK) LOCK FLUSH 100 UNIT/ML IV SOLN
500.0000 [IU] | Freq: Once | INTRAVENOUS | Status: AC | PRN
  Administered 2018-11-30: 500 [IU]
  Filled 2018-11-30: qty 5

## 2018-11-30 MED ORDER — DEXAMETHASONE SODIUM PHOSPHATE 10 MG/ML IJ SOLN
INTRAMUSCULAR | Status: AC
  Filled 2018-11-30: qty 1

## 2018-11-30 MED ORDER — SODIUM CHLORIDE 0.9 % IV SOLN
60.0000 mg/m2 | Freq: Once | INTRAVENOUS | Status: AC
  Administered 2018-11-30: 132 mg via INTRAVENOUS
  Filled 2018-11-30: qty 22

## 2018-11-30 MED ORDER — FAMOTIDINE IN NACL 20-0.9 MG/50ML-% IV SOLN
INTRAVENOUS | Status: AC
  Filled 2018-11-30: qty 50

## 2018-11-30 NOTE — Patient Instructions (Signed)
[facility] Discharge Instructions for Patients Receiving Chemotherapy  Today you received the following chemotherapy agents:  Taxol.  To help prevent nausea and vomiting after your treatment, we encourage you to take your nausea medication as directed.   If you develop nausea and vomiting that is not controlled by your nausea medication, call the clinic.   BELOW ARE SYMPTOMS THAT SHOULD BE REPORTED IMMEDIATELY:  *FEVER GREATER THAN 100.5 F  *CHILLS WITH OR WITHOUT FEVER  NAUSEA AND VOMITING THAT IS NOT CONTROLLED WITH YOUR NAUSEA MEDICATION  *UNUSUAL SHORTNESS OF BREATH  *UNUSUAL BRUISING OR BLEEDING  TENDERNESS IN MOUTH AND THROAT WITH OR WITHOUT PRESENCE OF ULCERS  *URINARY PROBLEMS  *BOWEL PROBLEMS  UNUSUAL RASH Items with * indicate a potential emergency and should be followed up as soon as possible.  Feel free to call the clinic should you have any questions or concerns. The clinic phone number is [phone].  Please show the CHEMO ALERT CARD at check-in to the Emergency Department and triage nurse.   

## 2018-12-04 ENCOUNTER — Other Ambulatory Visit: Payer: Self-pay

## 2018-12-04 ENCOUNTER — Inpatient Hospital Stay: Payer: BC Managed Care – PPO

## 2018-12-04 VITALS — BP 147/81 | HR 105 | Temp 98.3°F | Resp 20

## 2018-12-04 DIAGNOSIS — Z95828 Presence of other vascular implants and grafts: Secondary | ICD-10-CM

## 2018-12-04 DIAGNOSIS — Z5189 Encounter for other specified aftercare: Secondary | ICD-10-CM | POA: Diagnosis not present

## 2018-12-04 DIAGNOSIS — C50011 Malignant neoplasm of nipple and areola, right female breast: Secondary | ICD-10-CM

## 2018-12-04 MED ORDER — TBO-FILGRASTIM 300 MCG/0.5ML ~~LOC~~ SOSY
300.0000 ug | PREFILLED_SYRINGE | Freq: Once | SUBCUTANEOUS | Status: AC
  Administered 2018-12-04: 300 ug via SUBCUTANEOUS

## 2018-12-04 MED ORDER — TBO-FILGRASTIM 300 MCG/0.5ML ~~LOC~~ SOSY
PREFILLED_SYRINGE | SUBCUTANEOUS | Status: AC
  Filled 2018-12-04: qty 0.5

## 2018-12-04 NOTE — Patient Instructions (Signed)
Tbo-Filgrastim injection What is this medicine? TBO-FILGRASTIM (T B O fil GRA stim) is a granulocyte colony-stimulating factor that stimulates the growth of neutrophils, a type of white blood cell important in the body's fight against infection. It is used to reduce the incidence of fever and infection in patients with certain types of cancer who are receiving chemotherapy that affects the bone marrow. This medicine may be used for other purposes; ask your health care provider or pharmacist if you have questions. COMMON BRAND NAME(S): Granix What should I tell my health care provider before I take this medicine? They need to know if you have any of these conditions: -bone scan or tests planned -kidney disease -sickle cell anemia -an unusual or allergic reaction to tbo-filgrastim, filgrastim, pegfilgrastim, other medicines, foods, dyes, or preservatives -pregnant or trying to get pregnant -breast-feeding How should I use this medicine? This medicine is for injection under the skin. If you get this medicine at home, you will be taught how to prepare and give this medicine. Refer to the Instructions for Use that come with your medication packaging. Use exactly as directed. Take your medicine at regular intervals. Do not take your medicine more often than directed. It is important that you put your used needles and syringes in a special sharps container. Do not put them in a trash can. If you do not have a sharps container, call your pharmacist or healthcare provider to get one. Talk to your pediatrician regarding the use of this medicine in children. While this drug may be prescribed for children as young as 1 month of age for selected conditions, precautions do apply. Overdosage: If you think you have taken too much of this medicine contact a poison control center or emergency room at once. NOTE: This medicine is only for you. Do not share this medicine with others. What if I miss a dose? It is  important not to miss your dose. Call your doctor or health care professional if you miss a dose. What may interact with this medicine? This medicine may interact with the following medications: -medicines that may cause a release of neutrophils, such as lithium This list may not describe all possible interactions. Give your health care provider a list of all the medicines, herbs, non-prescription drugs, or dietary supplements you use. Also tell them if you smoke, drink alcohol, or use illegal drugs. Some items may interact with your medicine. What should I watch for while using this medicine? You may need blood work done while you are taking this medicine. What side effects may I notice from receiving this medicine? Side effects that you should report to your doctor or health care professional as soon as possible: -allergic reactions like skin rash, itching or hives, swelling of the face, lips, or tongue -back pain -blood in the urine -dark urine -dizziness -fast heartbeat -feeling faint -shortness of breath or breathing problems -signs and symptoms of infection like fever or chills; cough; or sore throat -signs and symptoms of kidney injury like trouble passing urine or change in the amount of urine -stomach or side pain, or pain at the shoulder -sweating -swelling of the legs, ankles, or abdomen -tiredness Side effects that usually do not require medical attention (report to your doctor or health care professional if they continue or are bothersome): -bone pain -diarrhea -headache -muscle pain -vomiting This list may not describe all possible side effects. Call your doctor for medical advice about side effects. You may report side effects to FDA at   1-800-FDA-1088. Where should I keep my medicine? Keep out of the reach of children. Store in a refrigerator between 2 and 8 degrees C (36 and 46 degrees F). Keep in carton to protect from light. Throw away this medicine if it is left out  of the refrigerator for more than 5 consecutive days. Throw away any unused medicine after the expiration date. NOTE: This sheet is a summary. It may not cover all possible information. If you have questions about this medicine, talk to your doctor, pharmacist, or health care provider.  2019 Elsevier/Gold Standard (2017-01-20 16:56:18)  

## 2018-12-07 ENCOUNTER — Inpatient Hospital Stay: Payer: BC Managed Care – PPO

## 2018-12-07 ENCOUNTER — Other Ambulatory Visit: Payer: Self-pay

## 2018-12-07 ENCOUNTER — Encounter: Payer: Self-pay | Admitting: *Deleted

## 2018-12-07 VITALS — BP 146/83 | HR 98 | Temp 98.2°F | Resp 18

## 2018-12-07 DIAGNOSIS — Z170 Estrogen receptor positive status [ER+]: Secondary | ICD-10-CM

## 2018-12-07 DIAGNOSIS — Z5189 Encounter for other specified aftercare: Secondary | ICD-10-CM | POA: Diagnosis not present

## 2018-12-07 DIAGNOSIS — Z95828 Presence of other vascular implants and grafts: Secondary | ICD-10-CM

## 2018-12-07 DIAGNOSIS — C50411 Malignant neoplasm of upper-outer quadrant of right female breast: Secondary | ICD-10-CM

## 2018-12-07 DIAGNOSIS — C50011 Malignant neoplasm of nipple and areola, right female breast: Secondary | ICD-10-CM

## 2018-12-07 LAB — CBC WITH DIFFERENTIAL (CANCER CENTER ONLY)
Abs Immature Granulocytes: 0.25 10*3/uL — ABNORMAL HIGH (ref 0.00–0.07)
Basophils Absolute: 0.1 10*3/uL (ref 0.0–0.1)
Basophils Relative: 1 %
Eosinophils Absolute: 0.1 10*3/uL (ref 0.0–0.5)
Eosinophils Relative: 1 %
HCT: 31.9 % — ABNORMAL LOW (ref 36.0–46.0)
Hemoglobin: 10.3 g/dL — ABNORMAL LOW (ref 12.0–15.0)
Immature Granulocytes: 3 %
Lymphocytes Relative: 8 %
Lymphs Abs: 0.7 10*3/uL (ref 0.7–4.0)
MCH: 30.2 pg (ref 26.0–34.0)
MCHC: 32.3 g/dL (ref 30.0–36.0)
MCV: 93.5 fL (ref 80.0–100.0)
Monocytes Absolute: 0.6 10*3/uL (ref 0.1–1.0)
Monocytes Relative: 6 %
Neutro Abs: 7.6 10*3/uL (ref 1.7–7.7)
Neutrophils Relative %: 81 %
Platelet Count: 215 10*3/uL (ref 150–400)
RBC: 3.41 MIL/uL — ABNORMAL LOW (ref 3.87–5.11)
RDW: 15.7 % — ABNORMAL HIGH (ref 11.5–15.5)
WBC Count: 9.3 10*3/uL (ref 4.0–10.5)
nRBC: 0 % (ref 0.0–0.2)

## 2018-12-07 LAB — CMP (CANCER CENTER ONLY)
ALT: 19 U/L (ref 0–44)
AST: 14 U/L — ABNORMAL LOW (ref 15–41)
Albumin: 3.9 g/dL (ref 3.5–5.0)
Alkaline Phosphatase: 84 U/L (ref 38–126)
Anion gap: 10 (ref 5–15)
BUN: 18 mg/dL (ref 8–23)
CO2: 25 mmol/L (ref 22–32)
Calcium: 8.5 mg/dL — ABNORMAL LOW (ref 8.9–10.3)
Chloride: 107 mmol/L (ref 98–111)
Creatinine: 1.1 mg/dL — ABNORMAL HIGH (ref 0.44–1.00)
GFR, Est AFR Am: >60 mL/min (ref >60)
GFR, Estimated: 52 mL/min — ABNORMAL LOW (ref >60)
Glucose, Bld: 157 mg/dL — ABNORMAL HIGH (ref 70–99)
Potassium: 3.7 mmol/L (ref 3.5–5.1)
Sodium: 142 mmol/L (ref 135–145)
Total Bilirubin: 0.4 mg/dL (ref 0.3–1.2)
Total Protein: 6.9 g/dL (ref 6.5–8.1)

## 2018-12-07 MED ORDER — DIPHENHYDRAMINE HCL 50 MG/ML IJ SOLN
INTRAMUSCULAR | Status: AC
  Filled 2018-12-07: qty 1

## 2018-12-07 MED ORDER — FAMOTIDINE IN NACL 20-0.9 MG/50ML-% IV SOLN
INTRAVENOUS | Status: AC
  Filled 2018-12-07: qty 50

## 2018-12-07 MED ORDER — DIPHENHYDRAMINE HCL 50 MG/ML IJ SOLN
25.0000 mg | Freq: Once | INTRAMUSCULAR | Status: AC
  Administered 2018-12-07: 10:00:00 25 mg via INTRAVENOUS

## 2018-12-07 MED ORDER — SODIUM CHLORIDE 0.9% FLUSH
10.0000 mL | INTRAVENOUS | Status: DC | PRN
  Administered 2018-12-07: 10 mL
  Filled 2018-12-07: qty 10

## 2018-12-07 MED ORDER — FAMOTIDINE IN NACL 20-0.9 MG/50ML-% IV SOLN
20.0000 mg | Freq: Once | INTRAVENOUS | Status: AC
  Administered 2018-12-07: 20 mg via INTRAVENOUS

## 2018-12-07 MED ORDER — DEXAMETHASONE SODIUM PHOSPHATE 10 MG/ML IJ SOLN
10.0000 mg | Freq: Once | INTRAMUSCULAR | Status: AC
  Administered 2018-12-07: 10 mg via INTRAVENOUS

## 2018-12-07 MED ORDER — DEXAMETHASONE SODIUM PHOSPHATE 10 MG/ML IJ SOLN
INTRAMUSCULAR | Status: AC
  Filled 2018-12-07: qty 1

## 2018-12-07 MED ORDER — SODIUM CHLORIDE 0.9 % IV SOLN
Freq: Once | INTRAVENOUS | Status: AC
  Administered 2018-12-07: 10:00:00 via INTRAVENOUS
  Filled 2018-12-07: qty 250

## 2018-12-07 MED ORDER — SODIUM CHLORIDE 0.9 % IV SOLN
60.0000 mg/m2 | Freq: Once | INTRAVENOUS | Status: AC
  Administered 2018-12-07: 132 mg via INTRAVENOUS
  Filled 2018-12-07: qty 22

## 2018-12-07 MED ORDER — SODIUM CHLORIDE 0.9% FLUSH
10.0000 mL | Freq: Once | INTRAVENOUS | Status: AC
  Administered 2018-12-07: 10 mL
  Filled 2018-12-07: qty 10

## 2018-12-07 MED ORDER — HEPARIN SOD (PORK) LOCK FLUSH 100 UNIT/ML IV SOLN
500.0000 [IU] | Freq: Once | INTRAVENOUS | Status: AC | PRN
  Administered 2018-12-07: 500 [IU]
  Filled 2018-12-07: qty 5

## 2018-12-07 NOTE — Patient Instructions (Signed)
[facility] Discharge Instructions for Patients Receiving Chemotherapy  Today you received the following chemotherapy agents Taxol   To help prevent nausea and vomiting after your treatment, we encourage you to take your nausea medication as directed.   If you develop nausea and vomiting that is not controlled by your nausea medication, call the clinic.   BELOW ARE SYMPTOMS THAT SHOULD BE REPORTED IMMEDIATELY:  *FEVER GREATER THAN 100.5 F  *CHILLS WITH OR WITHOUT FEVER  NAUSEA AND VOMITING THAT IS NOT CONTROLLED WITH YOUR NAUSEA MEDICATION  *UNUSUAL SHORTNESS OF BREATH  *UNUSUAL BRUISING OR BLEEDING  TENDERNESS IN MOUTH AND THROAT WITH OR WITHOUT PRESENCE OF ULCERS  *URINARY PROBLEMS  *BOWEL PROBLEMS  UNUSUAL RASH Items with * indicate a potential emergency and should be followed up as soon as possible.  Feel free to call the clinic you have any questions or concerns. The clinic phone number is [phone].  Please show the CHEMO ALERT CARD at check-in to the Emergency Department and triage nurse.   

## 2018-12-08 NOTE — Assessment & Plan Note (Signed)
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 Adj chemo 2. Adj RT 3. Adj Anti estrogen therapy  CT CAP: 07/02/2018: Subtle hypoattenuating lesion upper pole left kidney could be a cyst, urology consulted, left adrenal adenoma. --------------------------------------------------------------------------------------------------------------------- Current treatment:Completed 4 cycles ofdose dense Adriamycin and Cytoxan, Today is cycle10Taxolwith Granix support Labs reviewed Echocardiogram: 07/02/2018: EF 60 to 65%  Chemo toxicities: 1.Diarrhea: essentially resolved 2.Mild nausea: controlled 3.Fatigue: managing well.  4.Leukopenia:Taxol dose reduced to 60mg /m2, receives Granix 434mcg Saturday prior to treatment, due to WBC today and achiness, will reduce to 333mcg   5. Mucositis:Resolved.  Monitoring closely for peripheral neuropathy. Return to clinic weekly for Taxol and in 2 weeks she completes her 12 cycles of Taxol.

## 2018-12-09 ENCOUNTER — Other Ambulatory Visit: Payer: Self-pay | Admitting: Adult Health

## 2018-12-09 DIAGNOSIS — Z170 Estrogen receptor positive status [ER+]: Secondary | ICD-10-CM

## 2018-12-09 DIAGNOSIS — C50411 Malignant neoplasm of upper-outer quadrant of right female breast: Secondary | ICD-10-CM

## 2018-12-11 ENCOUNTER — Other Ambulatory Visit: Payer: Self-pay

## 2018-12-11 ENCOUNTER — Inpatient Hospital Stay: Payer: BC Managed Care – PPO

## 2018-12-11 VITALS — BP 155/71 | HR 108 | Temp 98.5°F | Resp 18

## 2018-12-11 DIAGNOSIS — Z95828 Presence of other vascular implants and grafts: Secondary | ICD-10-CM

## 2018-12-11 DIAGNOSIS — C50011 Malignant neoplasm of nipple and areola, right female breast: Secondary | ICD-10-CM

## 2018-12-11 DIAGNOSIS — Z5189 Encounter for other specified aftercare: Secondary | ICD-10-CM | POA: Diagnosis not present

## 2018-12-11 MED ORDER — TBO-FILGRASTIM 300 MCG/0.5ML ~~LOC~~ SOSY
PREFILLED_SYRINGE | SUBCUTANEOUS | Status: AC
  Filled 2018-12-11: qty 0.5

## 2018-12-11 MED ORDER — TBO-FILGRASTIM 300 MCG/0.5ML ~~LOC~~ SOSY
300.0000 ug | PREFILLED_SYRINGE | Freq: Once | SUBCUTANEOUS | Status: AC
  Administered 2018-12-11: 300 ug via SUBCUTANEOUS

## 2018-12-11 NOTE — Patient Instructions (Signed)
(Granix) Tbo-Filgrastim injection What is this medicine? TBO-FILGRASTIM (T B O fil GRA stim) is a granulocyte colony-stimulating factor that helps you make more neutrophils, a type of white blood cell. Neutrophils are important for fighting infections. Some chemotherapy affects your bone marrow and lowers your neutrophils. This medicine helps decrease the length of time that neutrophils are very low (severe neutropenia). This medicine may be used for other purposes; ask your health care provider or pharmacist if you have questions. COMMON BRAND NAME(S): Granix What should I tell my health care provider before I take this medicine? They need to know if you have any of these conditions:  bone scan or tests planned  kidney disease  sickle cell anemia  an unusual or allergic reaction to tbo-filgrastim, filgrastim, pegfilgrastim, other medicines, foods, dyes, or preservatives  pregnant or trying to get pregnant  breast-feeding How should I use this medicine? This medicine is for injection under the skin. If you get this medicine at home, you will be taught how to prepare and give this medicine. Refer to the Instructions for Use that come with your medication packaging. Use exactly as directed. Take your medicine at regular intervals. Do not take your medicine more often than directed. It is important that you put your used needles and syringes in a special sharps container. Do not put them in a trash can. If you do not have a sharps container, call your pharmacist or healthcare provider to get one. Talk to your pediatrician regarding the use of this medicine in children. While this drug may be prescribed for children as young as 39 month of age for selected conditions, precautions do apply. Overdosage: If you think you have taken too much of this medicine contact a poison control center or emergency room at once. NOTE: This medicine is only for you. Do not share this medicine with others. What if  I miss a dose? It is important not to miss your dose. Call your doctor or health care professional if you miss a dose. What may interact with this medicine? This medicine may interact with the following medications:  medicines that may cause a release of neutrophils, such as lithium This list may not describe all possible interactions. Give your health care provider a list of all the medicines, herbs, non-prescription drugs, or dietary supplements you use. Also tell them if you smoke, drink alcohol, or use illegal drugs. Some items may interact with your medicine. What should I watch for while using this medicine? You may need blood work done while you are taking this medicine. What side effects may I notice from receiving this medicine? Side effects that you should report to your doctor or health care professional as soon as possible:  allergic reactions like skin rash, itching or hives, swelling of the face, lips, or tongue  back pain  blood in the urine  dark urine  dizziness  fast heartbeat  feeling faint  shortness of breath or breathing problems  signs and symptoms of infection like fever or chills; cough; or sore throat  signs and symptoms of kidney injury like trouble passing urine or change in the amount of urine  stomach or side pain, or pain at the shoulder  sweating  swelling of the legs, ankles, or abdomen  tiredness Side effects that usually do not require medical attention (report to your doctor or health care professional if they continue or are bothersome):  bone pain  diarrhea  headache  muscle pain  vomiting This list  may not describe all possible side effects. Call your doctor for medical advice about side effects. You may report side effects to FDA at 1-800-FDA-1088. Where should I keep my medicine? Keep out of the reach of children. Store in a refrigerator between 2 and 8 degrees C (36 and 46 degrees F). Keep in carton to protect from light.  Throw away this medicine if it is left out of the refrigerator for more than 5 consecutive days. Throw away any unused medicine after the expiration date. NOTE: This sheet is a summary. It may not cover all possible information. If you have questions about this medicine, talk to your doctor, pharmacist, or health care provider.  2020 Elsevier/Gold Standard (2018-04-03 19:58:39)

## 2018-12-13 NOTE — Progress Notes (Signed)
Patient Care Team: [name], MD as PCP - General (Family Medicine)  DIAGNOSIS:    ICD-10-CM   1. Malignant neoplasm of upper-outer quadrant of right breast in female, estrogen receptor positive ([place])  C50.411    Z17.0     SUMMARY OF ONCOLOGIC HISTORY: Oncology History  Malignant neoplasm of upper-outer quadrant of right breast in female, estrogen receptor positive ([place])  11/12/2017 Initial Diagnosis   Palpable lesion in the right breast upper outer quadrant mammogram and ultrasound revealed 1.4 cm tumor ultrasound-guided biopsy revealed grade 2 IDC ER 3+, PR 3+, HER-2 negative with a Ki-67 of 10%.  Right axillary lymph node biopsy positive, T1cN1 stage I a clinical stage   12/23/2017 Cancer Staging   Staging form: Breast, AJCC 8th Edition - Clinical: Stage IB (cT1c, cN1, cM0, G2, ER+, PR+, HER2-) - Signed by Gardenia Phlegm, NP on 12/23/2017   01/10/2018 Breast MRI   Biopsy-proven malignancy UOQ right breast measuring 6 cm.  Anterior inferior to this is a 1 cm mass which needs biopsy.  Focal non-mass enhancement LOQ retroareolar right breast, left breast 7 mm irregular enhancement, 2 suspicious right axillary lymph nodes   01/27/2018 Procedure   Right breast biopsy UOQ: Grade 1 ILC, ALH Right breast biopsy 6:00: LCIS Left breast biopsy: Benign   06/21/2018 Surgery   Rt Mastectomy: 7 cm fibrotic mass ILC Grade 2, 5/6 LN Positive, ER 90-100%, PR 5%-70%, Ki 67 2-10%, Her 2 Neg, T3N2   07/13/2018 Surgery   Right axillary lymph node dissection: 3/10 lymph nodes positive making total number of lymph nodes 8/16   08/10/2018 -  Chemotherapy   Adjuvant chemotherapy with dose dense Adriamycin and Cytoxan x4 followed by Taxol weekly x12     CHIEF COMPLIANT: Cycle 10 Taxol  INTERVAL HISTORY: Charlene Perry is a 69 y.o. with above-mentioned history of right breast cancer who underwent a right mastectomyandcompleted 4 cycles ofadjuvant chemotherapy with dose  dense Adriamycin and Cytoxan. She is currently receiving weekly Taxol treatments.She presents to the clinic todayfor cycle 10.   REVIEW OF SYSTEMS:   Constitutional: Denies fevers, chills or abnormal weight loss Eyes: Denies blurriness of vision Ears, nose, mouth, throat, and face: Denies mucositis or sore throat Respiratory: Denies cough, dyspnea or wheezes Cardiovascular: Denies palpitation, chest discomfort Gastrointestinal: Denies nausea, heartburn or change in bowel habits Skin: Denies abnormal skin rashes Lymphatics: Denies new lymphadenopathy or easy bruising Neurological: Denies numbness, tingling or new weaknesses Behavioral/Psych: Mood is stable, no new changes  Extremities: No lower extremity edema Breast: denies any pain or lumps or nodules in either breasts All other systems were reviewed with the patient and are negative.  I have reviewed the past medical history, past surgical history, social history and family history with the patient and they are unchanged from previous note.  ALLERGIES:  has No Known Allergies.  MEDICATIONS:  Current Outpatient Medications  Medication Sig Dispense Refill  . aspirin EC 81 MG tablet Take 81 mg by mouth daily.    . celecoxib (CELEBREX) 200 MG capsule Take 200 mg by mouth daily.    . cholecalciferol (VITAMIN D3) 25 MCG (1000 UT) tablet Take 1 tablet (1,000 Units total) by mouth daily.    . furosemide (LASIX) 40 MG tablet Take 80 mg by mouth daily.    Marland Kitchen gabapentin (NEURONTIN) 400 MG capsule Take 400 mg by mouth 2 (two) times daily.    Marland Kitchen levothyroxine (SYNTHROID, LEVOTHROID) 125 MCG tablet Take 125 mcg by mouth daily before  breakfast.    . lidocaine-prilocaine (EMLA) cream Apply to affected area once 30 g 3  . LORazepam (ATIVAN) 0.5 MG tablet Take 1 tablet (0.5 mg total) by mouth at bedtime as needed (Nausea or vomiting). 30 tablet 0  . magic mouthwash SOLN Take 5 mLs by mouth 4 (four) times daily as needed for mouth pain. 240 mL 2  .  metFORMIN (GLUCOPHAGE) 1000 MG tablet Take 1,000 mg by mouth 2 (two) times daily with a meal.    . ondansetron (ZOFRAN) 8 MG tablet Take 1 tablet (8 mg total) by mouth 2 (two) times daily as needed. Start on the third day after chemotherapy. 30 tablet 1  . pantoprazole (PROTONIX) 40 MG tablet Take 1 tablet by mouth once daily 30 tablet 0  . potassium chloride (K-DUR,KLOR-CON) 10 MEQ tablet Take 20 mEq by mouth daily.     . prochlorperazine (COMPAZINE) 10 MG tablet Take 1 tablet (10 mg total) by mouth every 6 (six) hours as needed (Nausea or vomiting). 30 tablet 1  . rosuvastatin (CRESTOR) 40 MG tablet Take 40 mg by mouth at bedtime.     . valACYclovir (VALTREX) 500 MG tablet Take 1 tablet (500 mg total) by mouth 2 (two) times daily. 60 tablet 1   No current facility-administered medications for this visit.     PHYSICAL EXAMINATION: ECOG PERFORMANCE STATUS: 1 - Symptomatic but completely ambulatory  Vitals:   12/14/18 1022  BP: 139/74  Pulse: (!) 103  Resp: 17  Temp: 98.9 F (37.2 C)  SpO2: 98%   Filed Weights   12/14/18 1022  Weight: 219 lb 11.2 oz (99.7 kg)    GENERAL: alert, no distress and comfortable SKIN: skin color, texture, turgor are normal, no rashes or significant lesions EYES: normal, Conjunctiva are pink and non-injected, sclera clear OROPHARYNX: no exudate, no erythema and lips, buccal mucosa, and tongue normal  NECK: supple, thyroid normal size, non-tender, without nodularity LYMPH: no palpable lymphadenopathy in the cervical, axillary or inguinal LUNGS: clear to auscultation and percussion with normal breathing effort HEART: regular rate & rhythm and no murmurs and no lower extremity edema ABDOMEN: abdomen soft, non-tender and normal bowel sounds MUSCULOSKELETAL: no cyanosis of digits and no clubbing  NEURO: alert & oriented x 3 with fluent speech, no focal motor/sensory deficits EXTREMITIES: No lower extremity edema  LABORATORY DATA:  I have reviewed the data  as listed CMP Latest Ref Rng & Units 12/07/2018 11/30/2018 11/23/2018  Glucose 70 - 99 mg/dL 157(H) 157(H) 209(H)  BUN 8 - 23 mg/dL '18 11 17  '$ Creatinine 0.44 - 1.00 mg/dL 1.10(H) 0.95 1.03(H)  Sodium 135 - 145 mmol/L 142 144 142  Potassium 3.5 - 5.1 mmol/L 3.7 3.8 3.9  Chloride 98 - 111 mmol/L 107 105 106  CO2 22 - 32 mmol/L '25 27 24  '$ Calcium 8.9 - 10.3 mg/dL 8.5(L) 8.4(L) 8.9  Total Protein 6.5 - 8.1 g/dL 6.9 6.7 6.7  Total Bilirubin 0.3 - 1.2 mg/dL 0.4 0.4 0.3  Alkaline Phos 38 - 126 U/L 84 93 88  AST 15 - 41 U/L 14(L) 15 14(L)  ALT 0 - 44 U/L '19 21 17    '$ Lab Results  Component Value Date   WBC 9.7 12/14/2018   HGB 10.2 (L) 12/14/2018   HCT 31.7 (L) 12/14/2018   MCV 95.2 12/14/2018   PLT 201 12/14/2018   NEUTROABS 7.8 (H) 12/14/2018    ASSESSMENT & PLAN:  Malignant neoplasm of upper-outer quadrant of right breast in female,  estrogen receptor positive ([place]) 06/21/17:Rt Mastectomy: 7 cm fibrotic mass ILC Grade 2, 5/6 LN Positive, ER 90-100%, PR 5%-70%, Ki 67 2-10%, Her 2 Neg, T3N2 Treatment plan: 1. Adj chemo 2. Adj RT 3. Adj Anti estrogen therapy  CT CAP: 07/02/2018: Subtle hypoattenuating lesion upper pole left kidney could be a cyst, urology consulted, left adrenal adenoma. --------------------------------------------------------------------------------------------------------------------- Current treatment:Completed 4 cycles ofdose dense Adriamycin and Cytoxan, Today is cycle10Taxolwith Granix support Labs reviewed Echocardiogram: 07/02/2018: EF 60 to 65%  Chemo toxicities: 1.Diarrhea: essentially resolved 2.Mild nausea: controlled 3.Fatigue: managing well.  4.Leukopenia:Taxol dose reduced to '60mg'$ /m2, receives Granix 452mg Saturday prior to treatment, granix reduced to 3020m   5. Mucositis:Resolved.  Monitoring closely for peripheral neuropathy. Return to clinic weekly for Taxol and in 2 weeks she completes her 12 cycles of Taxol.  No orders of  the defined types were placed in this encounter.  The patient has a good understanding of the overall plan. she agrees with it. she will call with any problems that may develop before the next visit here.  GuNicholas LoseMD 12/14/2018  I,Julious Okaorshimer am acting as scribe for Dr. ViNicholas Lose I have reviewed the above documentation for accuracy and completeness, and I agree with the above.

## 2018-12-14 ENCOUNTER — Inpatient Hospital Stay: Payer: BC Managed Care – PPO

## 2018-12-14 ENCOUNTER — Inpatient Hospital Stay (HOSPITAL_BASED_OUTPATIENT_CLINIC_OR_DEPARTMENT_OTHER): Payer: BC Managed Care – PPO | Admitting: Hematology and Oncology

## 2018-12-14 ENCOUNTER — Other Ambulatory Visit: Payer: Self-pay

## 2018-12-14 VITALS — HR 95

## 2018-12-14 DIAGNOSIS — C50011 Malignant neoplasm of nipple and areola, right female breast: Secondary | ICD-10-CM

## 2018-12-14 DIAGNOSIS — C50411 Malignant neoplasm of upper-outer quadrant of right female breast: Secondary | ICD-10-CM

## 2018-12-14 DIAGNOSIS — Z170 Estrogen receptor positive status [ER+]: Secondary | ICD-10-CM

## 2018-12-14 DIAGNOSIS — Z95828 Presence of other vascular implants and grafts: Secondary | ICD-10-CM

## 2018-12-14 DIAGNOSIS — Z5189 Encounter for other specified aftercare: Secondary | ICD-10-CM | POA: Diagnosis not present

## 2018-12-14 LAB — CBC WITH DIFFERENTIAL (CANCER CENTER ONLY)
Abs Immature Granulocytes: 0.19 10*3/uL — ABNORMAL HIGH (ref 0.00–0.07)
Basophils Absolute: 0.1 10*3/uL (ref 0.0–0.1)
Basophils Relative: 1 %
Eosinophils Absolute: 0.1 10*3/uL (ref 0.0–0.5)
Eosinophils Relative: 1 %
HCT: 31.7 % — ABNORMAL LOW (ref 36.0–46.0)
Hemoglobin: 10.2 g/dL — ABNORMAL LOW (ref 12.0–15.0)
Immature Granulocytes: 2 %
Lymphocytes Relative: 10 %
Lymphs Abs: 1 10*3/uL (ref 0.7–4.0)
MCH: 30.6 pg (ref 26.0–34.0)
MCHC: 32.2 g/dL (ref 30.0–36.0)
MCV: 95.2 fL (ref 80.0–100.0)
Monocytes Absolute: 0.6 10*3/uL (ref 0.1–1.0)
Monocytes Relative: 6 %
Neutro Abs: 7.8 10*3/uL — ABNORMAL HIGH (ref 1.7–7.7)
Neutrophils Relative %: 80 %
Platelet Count: 201 10*3/uL (ref 150–400)
RBC: 3.33 MIL/uL — ABNORMAL LOW (ref 3.87–5.11)
RDW: 15 % (ref 11.5–15.5)
WBC Count: 9.7 10*3/uL (ref 4.0–10.5)
nRBC: 0 % (ref 0.0–0.2)

## 2018-12-14 LAB — CMP (CANCER CENTER ONLY)
ALT: 22 U/L (ref 0–44)
AST: 18 U/L (ref 15–41)
Albumin: 3.9 g/dL (ref 3.5–5.0)
Alkaline Phosphatase: 94 U/L (ref 38–126)
Anion gap: 9 (ref 5–15)
BUN: 21 mg/dL (ref 8–23)
CO2: 27 mmol/L (ref 22–32)
Calcium: 9.1 mg/dL (ref 8.9–10.3)
Chloride: 107 mmol/L (ref 98–111)
Creatinine: 1.09 mg/dL — ABNORMAL HIGH (ref 0.44–1.00)
GFR, Est AFR Am: >60 mL/min (ref >60)
GFR, Estimated: 53 mL/min — ABNORMAL LOW (ref >60)
Glucose, Bld: 163 mg/dL — ABNORMAL HIGH (ref 70–99)
Potassium: 3.8 mmol/L (ref 3.5–5.1)
Sodium: 143 mmol/L (ref 135–145)
Total Bilirubin: 0.4 mg/dL (ref 0.3–1.2)
Total Protein: 6.9 g/dL (ref 6.5–8.1)

## 2018-12-14 MED ORDER — DIPHENHYDRAMINE HCL 50 MG/ML IJ SOLN
25.0000 mg | Freq: Once | INTRAMUSCULAR | Status: AC
  Administered 2018-12-14: 25 mg via INTRAVENOUS

## 2018-12-14 MED ORDER — DEXAMETHASONE SODIUM PHOSPHATE 10 MG/ML IJ SOLN
10.0000 mg | Freq: Once | INTRAMUSCULAR | Status: AC
  Administered 2018-12-14: 10 mg via INTRAVENOUS

## 2018-12-14 MED ORDER — SODIUM CHLORIDE 0.9 % IV SOLN
60.0000 mg/m2 | Freq: Once | INTRAVENOUS | Status: AC
  Administered 2018-12-14: 132 mg via INTRAVENOUS
  Filled 2018-12-14: qty 22

## 2018-12-14 MED ORDER — DEXAMETHASONE SODIUM PHOSPHATE 10 MG/ML IJ SOLN
INTRAMUSCULAR | Status: AC
  Filled 2018-12-14: qty 1

## 2018-12-14 MED ORDER — FAMOTIDINE IN NACL 20-0.9 MG/50ML-% IV SOLN
20.0000 mg | Freq: Once | INTRAVENOUS | Status: AC
  Administered 2018-12-14: 20 mg via INTRAVENOUS

## 2018-12-14 MED ORDER — FAMOTIDINE IN NACL 20-0.9 MG/50ML-% IV SOLN
INTRAVENOUS | Status: AC
  Filled 2018-12-14: qty 50

## 2018-12-14 MED ORDER — HEPARIN SOD (PORK) LOCK FLUSH 100 UNIT/ML IV SOLN
500.0000 [IU] | Freq: Once | INTRAVENOUS | Status: AC | PRN
  Administered 2018-12-14: 500 [IU]
  Filled 2018-12-14: qty 5

## 2018-12-14 MED ORDER — SODIUM CHLORIDE 0.9% FLUSH
10.0000 mL | INTRAVENOUS | Status: DC | PRN
  Administered 2018-12-14: 10 mL
  Filled 2018-12-14: qty 10

## 2018-12-14 MED ORDER — SODIUM CHLORIDE 0.9 % IV SOLN
Freq: Once | INTRAVENOUS | Status: AC
  Administered 2018-12-14: 11:00:00 via INTRAVENOUS
  Filled 2018-12-14: qty 250

## 2018-12-14 MED ORDER — SODIUM CHLORIDE 0.9% FLUSH
10.0000 mL | Freq: Once | INTRAVENOUS | Status: AC
  Administered 2018-12-14: 10 mL
  Filled 2018-12-14: qty 10

## 2018-12-14 MED ORDER — DIPHENHYDRAMINE HCL 50 MG/ML IJ SOLN
INTRAMUSCULAR | Status: AC
  Filled 2018-12-14: qty 1

## 2018-12-14 NOTE — Patient Instructions (Signed)
[facility] Discharge Instructions for Patients Receiving Chemotherapy  Today you received the following chemotherapy agents Paclitaxel (TAXOL).  To help prevent nausea and vomiting after your treatment, we encourage you to take your nausea medication as prescribed.  If you develop nausea and vomiting that is not controlled by your nausea medication, call the clinic.   BELOW ARE SYMPTOMS THAT SHOULD BE REPORTED IMMEDIATELY:  *FEVER GREATER THAN 100.5 F  *CHILLS WITH OR WITHOUT FEVER  NAUSEA AND VOMITING THAT IS NOT CONTROLLED WITH YOUR NAUSEA MEDICATION  *UNUSUAL SHORTNESS OF BREATH  *UNUSUAL BRUISING OR BLEEDING  TENDERNESS IN MOUTH AND THROAT WITH OR WITHOUT PRESENCE OF ULCERS  *URINARY PROBLEMS  *BOWEL PROBLEMS  UNUSUAL RASH Items with * indicate a potential emergency and should be followed up as soon as possible.  Feel free to call the clinic should you have any questions or concerns. The clinic phone number is [phone].  Please show the CHEMO ALERT CARD at check-in to the Emergency Department and triage nurse.  Coronavirus (COVID-19) Are you at risk?  Are you at risk for the Coronavirus (COVID-19)?  To be considered HIGH RISK for Coronavirus (COVID-19), you have to meet the following criteria:  . Traveled to [place], [place], [place], [place] or [place]; or in the [place] to [place], [place], [place], or [place]; and have fever, cough, and shortness of breath within the last 2 weeks of travel OR . Been in close contact with a person diagnosed with COVID-19 within the last 2 weeks and have fever, cough, and shortness of breath . IF YOU DO NOT MEET THESE CRITERIA, YOU ARE CONSIDERED LOW RISK FOR COVID-19.  What to do if you are HIGH RISK for COVID-19?  . If you are having a medical emergency, call 911. . Seek medical care right away. Before you go to a doctor's office, urgent care or emergency department, call ahead and tell them about  your recent travel, contact with someone diagnosed with COVID-19, and your symptoms. You should receive instructions from your physician's office regarding next steps of care.  . When you arrive at healthcare provider, tell the healthcare staff immediately you have returned from visiting [place], [place], [place], [place] or [place]; or traveled in the [place] to [place], [place], [place], or [place]; in the last two weeks or you have been in close contact with a person diagnosed with COVID-19 in the last 2 weeks.   . Tell the health care staff about your symptoms: fever, cough and shortness of breath. . After you have been seen by a medical provider, you will be either: o Tested for (COVID-19) and discharged home on quarantine except to seek medical care if symptoms worsen, and asked to  - Stay home and avoid contact with others until you get your results (4-5 days)  - Avoid travel on public transportation if possible (such as bus, train, or airplane) or o Sent to the Emergency Department by EMS for evaluation, COVID-19 testing, and possible admission depending on your condition and test results.  What to do if you are LOW RISK for COVID-19?  Reduce your risk of any infection by using the same precautions used for avoiding the common cold or flu:  . Wash your hands often with soap and warm water for at least 20 seconds.  If soap and water are not readily available, use an alcohol-based hand sanitizer with at least 60% alcohol.  . If coughing or sneezing,   cover your mouth and nose by coughing or sneezing into the elbow areas of your shirt or coat, into a tissue or into your sleeve (not your hands). . Avoid shaking hands with others and consider head nods or verbal greetings only. . Avoid touching your eyes, nose, or mouth with unwashed hands.  . Avoid close contact with people who are sick. . Avoid places or events with large numbers of people in one location, like concerts or sporting  events. . Carefully consider travel plans you have or are making. . If you are planning any travel outside or inside the [place], visit the CDC's Travelers' Health webpage for the latest health notices. . If you have some symptoms but not all symptoms, continue to monitor at home and seek medical attention if your symptoms worsen. . If you are having a medical emergency, call 911.   ADDITIONAL HEALTHCARE OPTIONS FOR PATIENTS  [facility] / e-Visit: [email or web address]         [facility]: [phone]  [facility]: [phone]                   [facility]: [phone]     

## 2018-12-18 ENCOUNTER — Inpatient Hospital Stay: Payer: BC Managed Care – PPO | Attending: Hematology and Oncology

## 2018-12-18 ENCOUNTER — Other Ambulatory Visit: Payer: Self-pay

## 2018-12-18 VITALS — BP 153/94 | HR 108 | Temp 98.5°F | Resp 18

## 2018-12-18 DIAGNOSIS — C50011 Malignant neoplasm of nipple and areola, right female breast: Secondary | ICD-10-CM

## 2018-12-18 DIAGNOSIS — Z791 Long term (current) use of non-steroidal anti-inflammatories (NSAID): Secondary | ICD-10-CM | POA: Diagnosis not present

## 2018-12-18 DIAGNOSIS — Z9221 Personal history of antineoplastic chemotherapy: Secondary | ICD-10-CM | POA: Insufficient documentation

## 2018-12-18 DIAGNOSIS — Z95828 Presence of other vascular implants and grafts: Secondary | ICD-10-CM

## 2018-12-18 DIAGNOSIS — Z7982 Long term (current) use of aspirin: Secondary | ICD-10-CM | POA: Insufficient documentation

## 2018-12-18 DIAGNOSIS — E119 Type 2 diabetes mellitus without complications: Secondary | ICD-10-CM | POA: Diagnosis not present

## 2018-12-18 DIAGNOSIS — Z5189 Encounter for other specified aftercare: Secondary | ICD-10-CM | POA: Diagnosis present

## 2018-12-18 DIAGNOSIS — Z5111 Encounter for antineoplastic chemotherapy: Secondary | ICD-10-CM | POA: Insufficient documentation

## 2018-12-18 DIAGNOSIS — Z79899 Other long term (current) drug therapy: Secondary | ICD-10-CM | POA: Diagnosis not present

## 2018-12-18 DIAGNOSIS — C50411 Malignant neoplasm of upper-outer quadrant of right female breast: Secondary | ICD-10-CM | POA: Diagnosis not present

## 2018-12-18 DIAGNOSIS — Z7984 Long term (current) use of oral hypoglycemic drugs: Secondary | ICD-10-CM | POA: Insufficient documentation

## 2018-12-18 DIAGNOSIS — Z170 Estrogen receptor positive status [ER+]: Secondary | ICD-10-CM | POA: Diagnosis not present

## 2018-12-18 DIAGNOSIS — D701 Agranulocytosis secondary to cancer chemotherapy: Secondary | ICD-10-CM | POA: Insufficient documentation

## 2018-12-18 DIAGNOSIS — E785 Hyperlipidemia, unspecified: Secondary | ICD-10-CM | POA: Diagnosis not present

## 2018-12-18 DIAGNOSIS — R5383 Other fatigue: Secondary | ICD-10-CM | POA: Diagnosis not present

## 2018-12-18 MED ORDER — TBO-FILGRASTIM 300 MCG/0.5ML ~~LOC~~ SOSY
PREFILLED_SYRINGE | SUBCUTANEOUS | Status: AC
  Filled 2018-12-18: qty 0.5

## 2018-12-18 MED ORDER — TBO-FILGRASTIM 300 MCG/0.5ML ~~LOC~~ SOSY
300.0000 ug | PREFILLED_SYRINGE | Freq: Once | SUBCUTANEOUS | Status: AC
  Administered 2018-12-18: 10:00:00 300 ug via SUBCUTANEOUS

## 2018-12-18 NOTE — Patient Instructions (Signed)
(Granix) Tbo-Filgrastim injection What is this medicine? TBO-FILGRASTIM (T B O fil GRA stim) is a granulocyte colony-stimulating factor that helps you make more neutrophils, a type of white blood cell. Neutrophils are important for fighting infections. Some chemotherapy affects your bone marrow and lowers your neutrophils. This medicine helps decrease the length of time that neutrophils are very low (severe neutropenia). This medicine may be used for other purposes; ask your health care provider or pharmacist if you have questions. COMMON BRAND NAME(S): Granix What should I tell my health care provider before I take this medicine? They need to know if you have any of these conditions:  bone scan or tests planned  kidney disease  sickle cell anemia  an unusual or allergic reaction to tbo-filgrastim, filgrastim, pegfilgrastim, other medicines, foods, dyes, or preservatives  pregnant or trying to get pregnant  breast-feeding How should I use this medicine? This medicine is for injection under the skin. If you get this medicine at home, you will be taught how to prepare and give this medicine. Refer to the Instructions for Use that come with your medication packaging. Use exactly as directed. Take your medicine at regular intervals. Do not take your medicine more often than directed. It is important that you put your used needles and syringes in a special sharps container. Do not put them in a trash can. If you do not have a sharps container, call your pharmacist or healthcare provider to get one. Talk to your pediatrician regarding the use of this medicine in children. While this drug may be prescribed for children as young as 66 month of age for selected conditions, precautions do apply. Overdosage: If you think you have taken too much of this medicine contact a poison control center or emergency room at once. NOTE: This medicine is only for you. Do not share this medicine with others. What if  I miss a dose? It is important not to miss your dose. Call your doctor or health care professional if you miss a dose. What may interact with this medicine? This medicine may interact with the following medications:  medicines that may cause a release of neutrophils, such as lithium This list may not describe all possible interactions. Give your health care provider a list of all the medicines, herbs, non-prescription drugs, or dietary supplements you use. Also tell them if you smoke, drink alcohol, or use illegal drugs. Some items may interact with your medicine. What should I watch for while using this medicine? You may need blood work done while you are taking this medicine. What side effects may I notice from receiving this medicine? Side effects that you should report to your doctor or health care professional as soon as possible:  allergic reactions like skin rash, itching or hives, swelling of the face, lips, or tongue  back pain  blood in the urine  dark urine  dizziness  fast heartbeat  feeling faint  shortness of breath or breathing problems  signs and symptoms of infection like fever or chills; cough; or sore throat  signs and symptoms of kidney injury like trouble passing urine or change in the amount of urine  stomach or side pain, or pain at the shoulder  sweating  swelling of the legs, ankles, or abdomen  tiredness Side effects that usually do not require medical attention (report to your doctor or health care professional if they continue or are bothersome):  bone pain  diarrhea  headache  muscle pain  vomiting This list  may not describe all possible side effects. Call your doctor for medical advice about side effects. You may report side effects to FDA at 1-800-FDA-1088. Where should I keep my medicine? Keep out of the reach of children. Store in a refrigerator between 2 and 8 degrees C (36 and 46 degrees F). Keep in carton to protect from light.  Throw away this medicine if it is left out of the refrigerator for more than 5 consecutive days. Throw away any unused medicine after the expiration date. NOTE: This sheet is a summary. It may not cover all possible information. If you have questions about this medicine, talk to your doctor, pharmacist, or health care provider.  2020 Elsevier/Gold Standard (2018-04-03 19:58:39)

## 2018-12-21 ENCOUNTER — Inpatient Hospital Stay: Payer: BC Managed Care – PPO

## 2018-12-21 ENCOUNTER — Other Ambulatory Visit: Payer: Self-pay

## 2018-12-21 VITALS — BP 145/81 | HR 108 | Temp 98.2°F | Resp 18 | Wt 219.1 lb

## 2018-12-21 DIAGNOSIS — Z95828 Presence of other vascular implants and grafts: Secondary | ICD-10-CM

## 2018-12-21 DIAGNOSIS — Z170 Estrogen receptor positive status [ER+]: Secondary | ICD-10-CM

## 2018-12-21 DIAGNOSIS — C50411 Malignant neoplasm of upper-outer quadrant of right female breast: Secondary | ICD-10-CM

## 2018-12-21 DIAGNOSIS — Z5111 Encounter for antineoplastic chemotherapy: Secondary | ICD-10-CM | POA: Diagnosis not present

## 2018-12-21 DIAGNOSIS — C50011 Malignant neoplasm of nipple and areola, right female breast: Secondary | ICD-10-CM

## 2018-12-21 LAB — CMP (CANCER CENTER ONLY)
ALT: 23 U/L (ref 0–44)
AST: 18 U/L (ref 15–41)
Albumin: 3.7 g/dL (ref 3.5–5.0)
Alkaline Phosphatase: 101 U/L (ref 38–126)
Anion gap: 11 (ref 5–15)
BUN: 14 mg/dL (ref 8–23)
CO2: 29 mmol/L (ref 22–32)
Calcium: 9.5 mg/dL (ref 8.9–10.3)
Chloride: 101 mmol/L (ref 98–111)
Creatinine: 1.17 mg/dL — ABNORMAL HIGH (ref 0.44–1.00)
GFR, Est AFR Am: 56 mL/min — ABNORMAL LOW (ref >60)
GFR, Estimated: 49 mL/min — ABNORMAL LOW (ref >60)
Glucose, Bld: 215 mg/dL — ABNORMAL HIGH (ref 70–99)
Potassium: 4 mmol/L (ref 3.5–5.1)
Sodium: 141 mmol/L (ref 135–145)
Total Bilirubin: 0.4 mg/dL (ref 0.3–1.2)
Total Protein: 6.9 g/dL (ref 6.5–8.1)

## 2018-12-21 LAB — CBC WITH DIFFERENTIAL (CANCER CENTER ONLY)
Abs Immature Granulocytes: 0.15 10*3/uL — ABNORMAL HIGH (ref 0.00–0.07)
Basophils Absolute: 0 10*3/uL (ref 0.0–0.1)
Basophils Relative: 1 %
Eosinophils Absolute: 0.1 10*3/uL (ref 0.0–0.5)
Eosinophils Relative: 1 %
HCT: 32.6 % — ABNORMAL LOW (ref 36.0–46.0)
Hemoglobin: 10.6 g/dL — ABNORMAL LOW (ref 12.0–15.0)
Immature Granulocytes: 2 %
Lymphocytes Relative: 9 %
Lymphs Abs: 0.7 10*3/uL (ref 0.7–4.0)
MCH: 30.5 pg (ref 26.0–34.0)
MCHC: 32.5 g/dL (ref 30.0–36.0)
MCV: 93.7 fL (ref 80.0–100.0)
Monocytes Absolute: 0.5 10*3/uL (ref 0.1–1.0)
Monocytes Relative: 6 %
Neutro Abs: 6.2 10*3/uL (ref 1.7–7.7)
Neutrophils Relative %: 81 %
Platelet Count: 216 10*3/uL (ref 150–400)
RBC: 3.48 MIL/uL — ABNORMAL LOW (ref 3.87–5.11)
RDW: 14.6 % (ref 11.5–15.5)
WBC Count: 7.6 10*3/uL (ref 4.0–10.5)
nRBC: 0 % (ref 0.0–0.2)

## 2018-12-21 MED ORDER — DEXAMETHASONE SODIUM PHOSPHATE 10 MG/ML IJ SOLN
10.0000 mg | Freq: Once | INTRAMUSCULAR | Status: AC
  Administered 2018-12-21: 10 mg via INTRAVENOUS

## 2018-12-21 MED ORDER — DIPHENHYDRAMINE HCL 50 MG/ML IJ SOLN
INTRAMUSCULAR | Status: AC
  Filled 2018-12-21: qty 1

## 2018-12-21 MED ORDER — SODIUM CHLORIDE 0.9 % IV SOLN
Freq: Once | INTRAVENOUS | Status: AC
  Administered 2018-12-21: 14:00:00 via INTRAVENOUS
  Filled 2018-12-21: qty 250

## 2018-12-21 MED ORDER — SODIUM CHLORIDE 0.9% FLUSH
10.0000 mL | INTRAVENOUS | Status: DC | PRN
  Administered 2018-12-21: 10 mL
  Filled 2018-12-21: qty 10

## 2018-12-21 MED ORDER — FAMOTIDINE IN NACL 20-0.9 MG/50ML-% IV SOLN
INTRAVENOUS | Status: AC
  Filled 2018-12-21: qty 50

## 2018-12-21 MED ORDER — DIPHENHYDRAMINE HCL 50 MG/ML IJ SOLN
25.0000 mg | Freq: Once | INTRAMUSCULAR | Status: AC
  Administered 2018-12-21: 14:00:00 25 mg via INTRAVENOUS

## 2018-12-21 MED ORDER — SODIUM CHLORIDE 0.9% FLUSH
10.0000 mL | Freq: Once | INTRAVENOUS | Status: AC
  Administered 2018-12-21: 10 mL
  Filled 2018-12-21: qty 10

## 2018-12-21 MED ORDER — FAMOTIDINE IN NACL 20-0.9 MG/50ML-% IV SOLN
20.0000 mg | Freq: Once | INTRAVENOUS | Status: AC
  Administered 2018-12-21: 20 mg via INTRAVENOUS

## 2018-12-21 MED ORDER — HEPARIN SOD (PORK) LOCK FLUSH 100 UNIT/ML IV SOLN
500.0000 [IU] | Freq: Once | INTRAVENOUS | Status: AC | PRN
  Administered 2018-12-21: 500 [IU]
  Filled 2018-12-21: qty 5

## 2018-12-21 MED ORDER — SODIUM CHLORIDE 0.9 % IV SOLN
60.0000 mg/m2 | Freq: Once | INTRAVENOUS | Status: AC
  Administered 2018-12-21: 132 mg via INTRAVENOUS
  Filled 2018-12-21: qty 22

## 2018-12-21 MED ORDER — DEXAMETHASONE SODIUM PHOSPHATE 10 MG/ML IJ SOLN
INTRAMUSCULAR | Status: AC
  Filled 2018-12-21: qty 1

## 2018-12-21 NOTE — Progress Notes (Signed)
Okay to treat with HR of 108 for D1C11 of Taxol per Dr. Lindi Adie.

## 2018-12-21 NOTE — Patient Instructions (Signed)
[facility] Discharge Instructions for Patients Receiving Chemotherapy  Today you received the following chemotherapy agents Paclitaxel (TAXOL).  To help prevent nausea and vomiting after your treatment, we encourage you to take your nausea medication as prescribed.  If you develop nausea and vomiting that is not controlled by your nausea medication, call the clinic.   BELOW ARE SYMPTOMS THAT SHOULD BE REPORTED IMMEDIATELY:  *FEVER GREATER THAN 100.5 F  *CHILLS WITH OR WITHOUT FEVER  NAUSEA AND VOMITING THAT IS NOT CONTROLLED WITH YOUR NAUSEA MEDICATION  *UNUSUAL SHORTNESS OF BREATH  *UNUSUAL BRUISING OR BLEEDING  TENDERNESS IN MOUTH AND THROAT WITH OR WITHOUT PRESENCE OF ULCERS  *URINARY PROBLEMS  *BOWEL PROBLEMS  UNUSUAL RASH Items with * indicate a potential emergency and should be followed up as soon as possible.  Feel free to call the clinic should you have any questions or concerns. The clinic phone number is [phone].  Please show the CHEMO ALERT CARD at check-in to the Emergency Department and triage nurse.  Coronavirus (COVID-19) Are you at risk?  Are you at risk for the Coronavirus (COVID-19)?  To be considered HIGH RISK for Coronavirus (COVID-19), you have to meet the following criteria:  . Traveled to [place], [place], [place], [place] or [place]; or in the [place] to [place], [place], [place], or [place]; and have fever, cough, and shortness of breath within the last 2 weeks of travel OR . Been in close contact with a person diagnosed with COVID-19 within the last 2 weeks and have fever, cough, and shortness of breath . IF YOU DO NOT MEET THESE CRITERIA, YOU ARE CONSIDERED LOW RISK FOR COVID-19.  What to do if you are HIGH RISK for COVID-19?  . If you are having a medical emergency, call 911. . Seek medical care right away. Before you go to a doctor's office, urgent care or emergency department, call ahead and tell them about  your recent travel, contact with someone diagnosed with COVID-19, and your symptoms. You should receive instructions from your physician's office regarding next steps of care.  . When you arrive at healthcare provider, tell the healthcare staff immediately you have returned from visiting [place], [place], [place], [place] or [place]; or traveled in the [place] to [place], [place], [place], or [place]; in the last two weeks or you have been in close contact with a person diagnosed with COVID-19 in the last 2 weeks.   . Tell the health care staff about your symptoms: fever, cough and shortness of breath. . After you have been seen by a medical provider, you will be either: o Tested for (COVID-19) and discharged home on quarantine except to seek medical care if symptoms worsen, and asked to  - Stay home and avoid contact with others until you get your results (4-5 days)  - Avoid travel on public transportation if possible (such as bus, train, or airplane) or o Sent to the Emergency Department by EMS for evaluation, COVID-19 testing, and possible admission depending on your condition and test results.  What to do if you are LOW RISK for COVID-19?  Reduce your risk of any infection by using the same precautions used for avoiding the common cold or flu:  . Wash your hands often with soap and warm water for at least 20 seconds.  If soap and water are not readily available, use an alcohol-based hand sanitizer with at least 60% alcohol.  . If coughing or sneezing,   cover your mouth and nose by coughing or sneezing into the elbow areas of your shirt or coat, into a tissue or into your sleeve (not your hands). . Avoid shaking hands with others and consider head nods or verbal greetings only. . Avoid touching your eyes, nose, or mouth with unwashed hands.  . Avoid close contact with people who are sick. . Avoid places or events with large numbers of people in one location, like concerts or sporting  events. . Carefully consider travel plans you have or are making. . If you are planning any travel outside or inside the [place], visit the CDC's Travelers' Health webpage for the latest health notices. . If you have some symptoms but not all symptoms, continue to monitor at home and seek medical attention if your symptoms worsen. . If you are having a medical emergency, call 911.   ADDITIONAL HEALTHCARE OPTIONS FOR PATIENTS  [facility] / e-Visit: [email or web address]         [facility]: [phone]  [facility]: [phone]                   [facility]: [phone]     

## 2018-12-22 NOTE — Assessment & Plan Note (Signed)
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 Adj chemo 2. Adj RT 3. Adj Anti estrogen therapy  CT CAP: 07/02/2018: Subtle hypoattenuating lesion upper pole left kidney could be a cyst, urology consulted, left adrenal adenoma. --------------------------------------------------------------------------------------------------------------------- Current treatment:Completed 4 cycles ofdose dense Adriamycin and Cytoxan, Today is cycle12Taxolwith Granix support Labs reviewed Echocardiogram: 07/02/2018: EF 60 to 65%  Chemo toxicities: 1.Diarrhea: essentially resolved 2.Mild nausea: controlled 3.Fatigue: managing well.  4.Leukopenia:Taxol dose reduced to 60mg /m2, receives Granix 4102mcg Saturday prior to treatment, granix reduced to 362mcg  5. Mucositis:Resolved.  Monitoring closely for peripheral neuropathy. This will complete her chemotherapy. Radiation appointments are being made. Return to clinic at the end of radiation therapy to start antiestrogen therapy.

## 2018-12-25 ENCOUNTER — Inpatient Hospital Stay: Payer: BC Managed Care – PPO

## 2018-12-25 ENCOUNTER — Other Ambulatory Visit: Payer: Self-pay

## 2018-12-25 VITALS — BP 156/90 | HR 103 | Temp 98.0°F | Resp 16

## 2018-12-25 DIAGNOSIS — C50011 Malignant neoplasm of nipple and areola, right female breast: Secondary | ICD-10-CM

## 2018-12-25 DIAGNOSIS — Z95828 Presence of other vascular implants and grafts: Secondary | ICD-10-CM

## 2018-12-25 DIAGNOSIS — Z5111 Encounter for antineoplastic chemotherapy: Secondary | ICD-10-CM | POA: Diagnosis not present

## 2018-12-25 MED ORDER — TBO-FILGRASTIM 300 MCG/0.5ML ~~LOC~~ SOSY
300.0000 ug | PREFILLED_SYRINGE | Freq: Once | SUBCUTANEOUS | Status: AC
  Administered 2018-12-25: 300 ug via SUBCUTANEOUS

## 2018-12-25 MED ORDER — TBO-FILGRASTIM 300 MCG/0.5ML ~~LOC~~ SOSY
PREFILLED_SYRINGE | SUBCUTANEOUS | Status: AC
  Filled 2018-12-25: qty 0.5

## 2018-12-25 NOTE — Patient Instructions (Signed)
Tbo-Filgrastim injection What is this medicine? TBO-FILGRASTIM (T B O fil GRA stim) is a granulocyte colony-stimulating factor that helps you make more neutrophils, a type of white blood cell. Neutrophils are important for fighting infections. Some chemotherapy affects your bone marrow and lowers your neutrophils. This medicine helps decrease the length of time that neutrophils are very low (severe neutropenia). This medicine may be used for other purposes; ask your health care provider or pharmacist if you have questions. COMMON BRAND NAME(S): Granix What should I tell my health care provider before I take this medicine? They need to know if you have any of these conditions:  bone scan or tests planned  kidney disease  sickle cell anemia  an unusual or allergic reaction to tbo-filgrastim, filgrastim, pegfilgrastim, other medicines, foods, dyes, or preservatives  pregnant or trying to get pregnant  breast-feeding How should I use this medicine? This medicine is for injection under the skin. If you get this medicine at home, you will be taught how to prepare and give this medicine. Refer to the Instructions for Use that come with your medication packaging. Use exactly as directed. Take your medicine at regular intervals. Do not take your medicine more often than directed. It is important that you put your used needles and syringes in a special sharps container. Do not put them in a trash can. If you do not have a sharps container, call your pharmacist or healthcare provider to get one. Talk to your pediatrician regarding the use of this medicine in children. While this drug may be prescribed for children as young as 1 month of age for selected conditions, precautions do apply. Overdosage: If you think you have taken too much of this medicine contact a poison control center or emergency room at once. NOTE: This medicine is only for you. Do not share this medicine with others. What if I miss a  dose? It is important not to miss your dose. Call your doctor or health care professional if you miss a dose. What may interact with this medicine? This medicine may interact with the following medications:  medicines that may cause a release of neutrophils, such as lithium This list may not describe all possible interactions. Give your health care provider a list of all the medicines, herbs, non-prescription drugs, or dietary supplements you use. Also tell them if you smoke, drink alcohol, or use illegal drugs. Some items may interact with your medicine. What should I watch for while using this medicine? You may need blood work done while you are taking this medicine. What side effects may I notice from receiving this medicine? Side effects that you should report to your doctor or health care professional as soon as possible:  allergic reactions like skin rash, itching or hives, swelling of the face, lips, or tongue  back pain  blood in the urine  dark urine  dizziness  fast heartbeat  feeling faint  shortness of breath or breathing problems  signs and symptoms of infection like fever or chills; cough; or sore throat  signs and symptoms of kidney injury like trouble passing urine or change in the amount of urine  stomach or side pain, or pain at the shoulder  sweating  swelling of the legs, ankles, or abdomen  tiredness Side effects that usually do not require medical attention (report to your doctor or health care professional if they continue or are bothersome):  bone pain  diarrhea  headache  muscle pain  vomiting This list may   not describe all possible side effects. Call your doctor for medical advice about side effects. You may report side effects to FDA at 1-800-FDA-1088. Where should I keep my medicine? Keep out of the reach of children. Store in a refrigerator between 2 and 8 degrees C (36 and 46 degrees F). Keep in carton to protect from light. Throw  away this medicine if it is left out of the refrigerator for more than 5 consecutive days. Throw away any unused medicine after the expiration date. NOTE: This sheet is a summary. It may not cover all possible information. If you have questions about this medicine, talk to your doctor, pharmacist, or health care provider.  2020 Elsevier/Gold Standard (2018-04-03 19:58:39)  

## 2018-12-27 NOTE — Progress Notes (Signed)
Patient Care Team: [name], MD as PCP - General (Family Medicine)  DIAGNOSIS:    ICD-10-CM   1. Malignant neoplasm of upper-outer quadrant of right breast in female, estrogen receptor positive ([place])  C50.411    Z17.0     SUMMARY OF ONCOLOGIC HISTORY: Oncology History  Malignant neoplasm of upper-outer quadrant of right breast in female, estrogen receptor positive ([place])  11/12/2017 Initial Diagnosis   Palpable lesion in the right breast upper outer quadrant mammogram and ultrasound revealed 1.4 cm tumor ultrasound-guided biopsy revealed grade 2 IDC ER 3+, PR 3+, HER-2 negative with a Ki-67 of 10%.  Right axillary lymph node biopsy positive, T1cN1 stage I a clinical stage   12/23/2017 Cancer Staging   Staging form: Breast, AJCC 8th Edition - Clinical: Stage IB (cT1c, cN1, cM0, G2, ER+, PR+, HER2-) - Signed by Gardenia Phlegm, NP on 12/23/2017   01/10/2018 Breast MRI   Biopsy-proven malignancy UOQ right breast measuring 6 cm.  Anterior inferior to this is a 1 cm mass which needs biopsy.  Focal non-mass enhancement LOQ retroareolar right breast, left breast 7 mm irregular enhancement, 2 suspicious right axillary lymph nodes   01/27/2018 Procedure   Right breast biopsy UOQ: Grade 1 ILC, ALH Right breast biopsy 6:00: LCIS Left breast biopsy: Benign   06/21/2018 Surgery   Rt Mastectomy: 7 cm fibrotic mass ILC Grade 2, 5/6 LN Positive, ER 90-100%, PR 5%-70%, Ki 67 2-10%, Her 2 Neg, T3N2   07/13/2018 Surgery   Right axillary lymph node dissection: 3/10 lymph nodes positive making total number of lymph nodes 8/16   08/10/2018 -  Chemotherapy   Adjuvant chemotherapy with dose dense Adriamycin and Cytoxan x4 followed by Taxol weekly x12     CHIEF COMPLIANT: Cycle 12 Taxol  INTERVAL HISTORY: Charlene Perry is a 68 y.o. with above-mentioned history of right breast cancer who underwent a right mastectomyandcompleted 4 cycles ofadjuvant chemotherapy with dose  dense Adriamycin and Cytoxan. She is currently receiving weekly Taxol treatments.She presents to the clinic todayfor cycle12.   REVIEW OF SYSTEMS:   Constitutional: Denies fevers, chills or abnormal weight loss Eyes: Denies blurriness of vision Ears, nose, mouth, throat, and face: Denies mucositis or sore throat Respiratory: Denies cough, dyspnea or wheezes Cardiovascular: Denies palpitation, chest discomfort Gastrointestinal: Denies nausea, heartburn or change in bowel habits Skin: Denies abnormal skin rashes Lymphatics: Denies new lymphadenopathy or easy bruising Neurological: Denies numbness, tingling or new weaknesses Behavioral/Psych: Mood is stable, no new changes  Extremities: No lower extremity edema Breast: denies any pain or lumps or nodules in either breasts All other systems were reviewed with the patient and are negative.  I have reviewed the past medical history, past surgical history, social history and family history with the patient and they are unchanged from previous note.  ALLERGIES:  has No Known Allergies.  MEDICATIONS:  Current Outpatient Medications  Medication Sig Dispense Refill  . aspirin EC 81 MG tablet Take 81 mg by mouth daily.    . celecoxib (CELEBREX) 200 MG capsule Take 200 mg by mouth daily.    . cholecalciferol (VITAMIN D3) 25 MCG (1000 UT) tablet Take 1 tablet (1,000 Units total) by mouth daily.    . furosemide (LASIX) 40 MG tablet Take 80 mg by mouth daily.    Marland Kitchen gabapentin (NEURONTIN) 400 MG capsule Take 400 mg by mouth 2 (two) times daily.    Marland Kitchen levothyroxine (SYNTHROID, LEVOTHROID) 125 MCG tablet Take 125 mcg by mouth daily before breakfast.    .  lidocaine-prilocaine (EMLA) cream Apply to affected area once 30 g 3  . LORazepam (ATIVAN) 0.5 MG tablet Take 1 tablet (0.5 mg total) by mouth at bedtime as needed (Nausea or vomiting). 30 tablet 0  . magic mouthwash SOLN Take 5 mLs by mouth 4 (four) times daily as needed for mouth pain. 240 mL 2  .  metFORMIN (GLUCOPHAGE) 1000 MG tablet Take 1,000 mg by mouth 2 (two) times daily with a meal.    . ondansetron (ZOFRAN) 8 MG tablet Take 1 tablet (8 mg total) by mouth 2 (two) times daily as needed. Start on the third day after chemotherapy. 30 tablet 1  . pantoprazole (PROTONIX) 40 MG tablet Take 1 tablet by mouth once daily 30 tablet 0  . potassium chloride (K-DUR,KLOR-CON) 10 MEQ tablet Take 20 mEq by mouth daily.     . prochlorperazine (COMPAZINE) 10 MG tablet Take 1 tablet (10 mg total) by mouth every 6 (six) hours as needed (Nausea or vomiting). 30 tablet 1  . rosuvastatin (CRESTOR) 40 MG tablet Take 40 mg by mouth at bedtime.     . valACYclovir (VALTREX) 500 MG tablet Take 1 tablet (500 mg total) by mouth 2 (two) times daily. 60 tablet 1   No current facility-administered medications for this visit.     PHYSICAL EXAMINATION: ECOG PERFORMANCE STATUS: 1 - Symptomatic but completely ambulatory  Vitals:   12/28/18 0847  BP: 137/79  Pulse: (!) 109  Resp: 17  Temp: 98.7 F (37.1 C)  SpO2: 99%   Filed Weights   12/28/18 0847  Weight: 223 lb 3.2 oz (101.2 kg)    GENERAL: alert, no distress and comfortable SKIN: skin color, texture, turgor are normal, no rashes or significant lesions EYES: normal, Conjunctiva are pink and non-injected, sclera clear OROPHARYNX: no exudate, no erythema and lips, buccal mucosa, and tongue normal  NECK: supple, thyroid normal size, non-tender, without nodularity LYMPH: no palpable lymphadenopathy in the cervical, axillary or inguinal LUNGS: clear to auscultation and percussion with normal breathing effort HEART: regular rate & rhythm and no murmurs and no lower extremity edema ABDOMEN: abdomen soft, non-tender and normal bowel sounds MUSCULOSKELETAL: no cyanosis of digits and no clubbing  NEURO: alert & oriented x 3 with fluent speech, no focal motor/sensory deficits EXTREMITIES: No lower extremity edema  LABORATORY DATA:  I have reviewed the data  as listed CMP Latest Ref Rng & Units 12/21/2018 12/14/2018 12/07/2018  Glucose 70 - 99 mg/dL 215(H) 163(H) 157(H)  BUN 8 - 23 mg/dL _0 Creatinine 0.44 - 1.00 mg/dL 1.17(H) 1.09(H) 1.10(H)  Sodium 135 - 145 mmol/L 141 143 142  Potassium 3.5 - 5.1 mmol/L 4.0 3.8 3.7  Chloride 98 - 111 mmol/L 101 107 107  CO2 22 - 32 mmol/L _1 Calcium 8.9 - 10.3 mg/dL 9.5 9.1 8.5(L)  Total Protein 6.5 - 8.1 g/dL 6.9 6.9 6.9  Total Bilirubin 0.3 - 1.2 mg/dL 0.4 0.4 0.4  Alkaline Phos 38 - 126 U/L 101 94 84  AST 15 - 41 U/L 18 18 14(L)  ALT 0 - 44 U/L _2 Lab Results  Component Value Date   WBC 11.7 (H) 12/28/2018   HGB 10.6 (L) 12/28/2018   HCT 33.0 (L) 12/28/2018   MCV 96.2 12/28/2018   PLT 230 12/28/2018   NEUTROABS 9.7 (H) 12/28/2018    ASSESSMENT & PLAN:  Malignant neoplasm of upper-outer quadrant of right breast in female, estrogen receptor positive ([place])  06/21/17:Rt Mastectomy: 7 cm fibrotic mass ILC Grade 2, 5/6 LN Positive, ER 90-100%, PR 5%-70%, Ki 67 2-10%, Her 2 Neg, T3N2 Treatment plan: 1. Adj chemo 2. Adj RT 3. Adj Anti estrogen therapy  CT CAP: 07/02/2018: Subtle hypoattenuating lesion upper pole left kidney could be a cyst, urology consulted, left adrenal adenoma. --------------------------------------------------------------------------------------------------------------------- Current treatment:Completed 4 cycles ofdose dense Adriamycin and Cytoxan, Today is cycle12Taxolwith Granix support Labs reviewed Echocardiogram: 07/02/2018: EF 60 to 65%  Chemo toxicities: 1.Diarrhea: essentially resolved 2.Mild nausea: controlled 3.Fatigue: managing well.  4.Leukopenia:Taxol dose reduced to 73m/m2, receives Granix 4833m Saturday prior to treatment, granix reduced to 30011m 5. Mucositis:Resolved.  Monitoring closely for peripheral neuropathy. This will complete her chemotherapy. Radiation appointments are being made. Return to clinic at the  end of radiation therapy to start antiestrogen therapy.    No orders of the defined types were placed in this encounter.  The patient has a good understanding of the overall plan. she agrees with it. she will call with any problems that may develop before the next visit here.  GudNicholas LoseD 12/28/2018  I, Julious Okarshimer am acting as scribe for Dr. VinNicholas LoseI have reviewed the above documentation for accuracy and completeness, and I agree with the above.

## 2018-12-28 ENCOUNTER — Inpatient Hospital Stay: Payer: BC Managed Care – PPO

## 2018-12-28 ENCOUNTER — Inpatient Hospital Stay: Payer: BC Managed Care – PPO | Admitting: Hematology and Oncology

## 2018-12-28 ENCOUNTER — Encounter: Payer: Self-pay | Admitting: *Deleted

## 2018-12-28 ENCOUNTER — Other Ambulatory Visit: Payer: Self-pay

## 2018-12-28 DIAGNOSIS — Z95828 Presence of other vascular implants and grafts: Secondary | ICD-10-CM

## 2018-12-28 DIAGNOSIS — C50411 Malignant neoplasm of upper-outer quadrant of right female breast: Secondary | ICD-10-CM

## 2018-12-28 DIAGNOSIS — E119 Type 2 diabetes mellitus without complications: Secondary | ICD-10-CM

## 2018-12-28 DIAGNOSIS — Z170 Estrogen receptor positive status [ER+]: Secondary | ICD-10-CM | POA: Diagnosis not present

## 2018-12-28 DIAGNOSIS — D701 Agranulocytosis secondary to cancer chemotherapy: Secondary | ICD-10-CM

## 2018-12-28 DIAGNOSIS — Z791 Long term (current) use of non-steroidal anti-inflammatories (NSAID): Secondary | ICD-10-CM

## 2018-12-28 DIAGNOSIS — Z7984 Long term (current) use of oral hypoglycemic drugs: Secondary | ICD-10-CM

## 2018-12-28 DIAGNOSIS — C50011 Malignant neoplasm of nipple and areola, right female breast: Secondary | ICD-10-CM

## 2018-12-28 DIAGNOSIS — R5383 Other fatigue: Secondary | ICD-10-CM

## 2018-12-28 DIAGNOSIS — Z7982 Long term (current) use of aspirin: Secondary | ICD-10-CM

## 2018-12-28 DIAGNOSIS — Z9221 Personal history of antineoplastic chemotherapy: Secondary | ICD-10-CM

## 2018-12-28 DIAGNOSIS — Z79899 Other long term (current) drug therapy: Secondary | ICD-10-CM

## 2018-12-28 DIAGNOSIS — E785 Hyperlipidemia, unspecified: Secondary | ICD-10-CM

## 2018-12-28 DIAGNOSIS — Z5111 Encounter for antineoplastic chemotherapy: Secondary | ICD-10-CM | POA: Diagnosis not present

## 2018-12-28 LAB — CBC WITH DIFFERENTIAL (CANCER CENTER ONLY)
Abs Immature Granulocytes: 0.26 10*3/uL — ABNORMAL HIGH (ref 0.00–0.07)
Basophils Absolute: 0.1 10*3/uL (ref 0.0–0.1)
Basophils Relative: 1 %
Eosinophils Absolute: 0.1 10*3/uL (ref 0.0–0.5)
Eosinophils Relative: 1 %
HCT: 33 % — ABNORMAL LOW (ref 36.0–46.0)
Hemoglobin: 10.6 g/dL — ABNORMAL LOW (ref 12.0–15.0)
Immature Granulocytes: 2 %
Lymphocytes Relative: 9 %
Lymphs Abs: 1 10*3/uL (ref 0.7–4.0)
MCH: 30.9 pg (ref 26.0–34.0)
MCHC: 32.1 g/dL (ref 30.0–36.0)
MCV: 96.2 fL (ref 80.0–100.0)
Monocytes Absolute: 0.6 10*3/uL (ref 0.1–1.0)
Monocytes Relative: 5 %
Neutro Abs: 9.7 10*3/uL — ABNORMAL HIGH (ref 1.7–7.7)
Neutrophils Relative %: 82 %
Platelet Count: 230 10*3/uL (ref 150–400)
RBC: 3.43 MIL/uL — ABNORMAL LOW (ref 3.87–5.11)
RDW: 14.3 % (ref 11.5–15.5)
WBC Count: 11.7 10*3/uL — ABNORMAL HIGH (ref 4.0–10.5)
nRBC: 0 % (ref 0.0–0.2)

## 2018-12-28 LAB — CMP (CANCER CENTER ONLY)
ALT: 25 U/L (ref 0–44)
AST: 17 U/L (ref 15–41)
Albumin: 3.9 g/dL (ref 3.5–5.0)
Alkaline Phosphatase: 99 U/L (ref 38–126)
Anion gap: 11 (ref 5–15)
BUN: 16 mg/dL (ref 8–23)
CO2: 24 mmol/L (ref 22–32)
Calcium: 9.1 mg/dL (ref 8.9–10.3)
Chloride: 110 mmol/L (ref 98–111)
Creatinine: 1.14 mg/dL — ABNORMAL HIGH (ref 0.44–1.00)
GFR, Est AFR Am: 58 mL/min — ABNORMAL LOW (ref >60)
GFR, Estimated: 50 mL/min — ABNORMAL LOW (ref >60)
Glucose, Bld: 170 mg/dL — ABNORMAL HIGH (ref 70–99)
Potassium: 4 mmol/L (ref 3.5–5.1)
Sodium: 145 mmol/L (ref 135–145)
Total Bilirubin: 0.3 mg/dL (ref 0.3–1.2)
Total Protein: 7 g/dL (ref 6.5–8.1)

## 2018-12-28 MED ORDER — DIPHENHYDRAMINE HCL 50 MG/ML IJ SOLN
INTRAMUSCULAR | Status: AC
  Filled 2018-12-28: qty 1

## 2018-12-28 MED ORDER — SODIUM CHLORIDE 0.9 % IV SOLN
60.0000 mg/m2 | Freq: Once | INTRAVENOUS | Status: AC
  Administered 2018-12-28: 11:00:00 132 mg via INTRAVENOUS
  Filled 2018-12-28: qty 22

## 2018-12-28 MED ORDER — DEXAMETHASONE SODIUM PHOSPHATE 10 MG/ML IJ SOLN
10.0000 mg | Freq: Once | INTRAMUSCULAR | Status: AC
  Administered 2018-12-28: 09:00:00 10 mg via INTRAVENOUS

## 2018-12-28 MED ORDER — DIPHENHYDRAMINE HCL 50 MG/ML IJ SOLN
25.0000 mg | Freq: Once | INTRAMUSCULAR | Status: AC
  Administered 2018-12-28: 25 mg via INTRAVENOUS

## 2018-12-28 MED ORDER — DEXAMETHASONE SODIUM PHOSPHATE 10 MG/ML IJ SOLN
INTRAMUSCULAR | Status: AC
  Filled 2018-12-28: qty 1

## 2018-12-28 MED ORDER — HEPARIN SOD (PORK) LOCK FLUSH 100 UNIT/ML IV SOLN
500.0000 [IU] | Freq: Once | INTRAVENOUS | Status: AC | PRN
  Administered 2018-12-28: 500 [IU]
  Filled 2018-12-28: qty 5

## 2018-12-28 MED ORDER — SODIUM CHLORIDE 0.9 % IV SOLN
Freq: Once | INTRAVENOUS | Status: AC
  Administered 2018-12-28: 09:00:00 via INTRAVENOUS
  Filled 2018-12-28: qty 250

## 2018-12-28 MED ORDER — SODIUM CHLORIDE 0.9% FLUSH
10.0000 mL | INTRAVENOUS | Status: DC | PRN
  Administered 2018-12-28: 12:00:00 10 mL
  Filled 2018-12-28: qty 10

## 2018-12-28 MED ORDER — SODIUM CHLORIDE 0.9% FLUSH
10.0000 mL | Freq: Once | INTRAVENOUS | Status: AC
  Administered 2018-12-28: 10 mL
  Filled 2018-12-28: qty 10

## 2018-12-28 MED ORDER — FAMOTIDINE IN NACL 20-0.9 MG/50ML-% IV SOLN
INTRAVENOUS | Status: AC
  Filled 2018-12-28: qty 50

## 2018-12-28 MED ORDER — FAMOTIDINE IN NACL 20-0.9 MG/50ML-% IV SOLN
20.0000 mg | Freq: Once | INTRAVENOUS | Status: AC
  Administered 2018-12-28: 09:00:00 20 mg via INTRAVENOUS

## 2018-12-28 NOTE — Progress Notes (Signed)
Per Dr. Lindi Adie ok to treat with Taxol today with heart rate of 109

## 2018-12-28 NOTE — Patient Instructions (Signed)
[facility] Discharge Instructions for Patients Receiving Chemotherapy  Today you received the following chemotherapy agents:  Taxol.  To help prevent nausea and vomiting after your treatment, we encourage you to take your nausea medication as directed.   If you develop nausea and vomiting that is not controlled by your nausea medication, call the clinic.   BELOW ARE SYMPTOMS THAT SHOULD BE REPORTED IMMEDIATELY:  *FEVER GREATER THAN 100.5 F  *CHILLS WITH OR WITHOUT FEVER  NAUSEA AND VOMITING THAT IS NOT CONTROLLED WITH YOUR NAUSEA MEDICATION  *UNUSUAL SHORTNESS OF BREATH  *UNUSUAL BRUISING OR BLEEDING  TENDERNESS IN MOUTH AND THROAT WITH OR WITHOUT PRESENCE OF ULCERS  *URINARY PROBLEMS  *BOWEL PROBLEMS  UNUSUAL RASH Items with * indicate a potential emergency and should be followed up as soon as possible.  Feel free to call the clinic should you have any questions or concerns. The clinic phone number is [phone].  Please show the CHEMO ALERT CARD at check-in to the Emergency Department and triage nurse.   

## 2018-12-30 ENCOUNTER — Ambulatory Visit
Admission: RE | Admit: 2018-12-30 | Discharge: 2018-12-30 | Disposition: A | Discharge status: Home / Self Care | Payer: BC Managed Care – PPO | Source: Ambulatory Visit | Attending: Radiation Oncology | Admitting: Radiation Oncology

## 2018-12-30 ENCOUNTER — Other Ambulatory Visit: Payer: Self-pay

## 2018-12-30 VITALS — Wt 223.0 lb

## 2018-12-30 DIAGNOSIS — C50411 Malignant neoplasm of upper-outer quadrant of right female breast: Secondary | ICD-10-CM

## 2018-12-30 DIAGNOSIS — Z170 Estrogen receptor positive status [ER+]: Secondary | ICD-10-CM

## 2018-12-30 NOTE — Progress Notes (Signed)
Radiation Oncology         [phone] ________________________________  Name: Charlene Perry        MRN: [id number]  Date of Service: 12/30/2018 DOB: Jun 10, 1951  TK:[id number], [name], MD  [name], MD     REFERRING PHYSICIAN: [name], MD   DIAGNOSIS: The encounter diagnosis was Malignant neoplasm of upper-outer quadrant of right breast in female, estrogen receptor positive ([place]).   HISTORY OF PRESENT ILLNESS: Charlene Perry is a 68 y.o. female originally seen at the request of Dr. Lindi Adie  for a new diagnosis of right breast cancer. The patient was noted to have in the right breast which prompted evaluation with her provider and subsequent diagnostic mammogram on 11/12/2017 that revealed a suspicious abnormality, per notes about 1.4 cm in the right breast.  This prompted a biopsy at 10:00 of the right breast revealing an invasive and in situ mammary carcinoma supporting a lobular phenotype, grade 1-2.  The outside review revealed ER PR positive, HER-2/neu negative, Ki-67 of 10%.  When she presented for evaluation in [place], she was counseled on further assessment of the axilla, and subsequent ultrasound revealed two low level axillary nodes with minimal fat, and normal fatty nodes nearby, and a biopsy of one of the two suspcious lymph nodes revealed metastatic carcinoma in the lymph node, ER PR positive, HER-2 negative, Ki-67 of 10%. Revealing her biopsy-proven malignancy in the upper outer quadrant of the right breast, along the inferior extent of the enhancement following her visit with [place], she did undergo an MRI of the breast on 01/11/2019.  This revealed a 6 cm area of enhancement that was suspicious in the upper outer breast, and in the inferior extent of this there was a 1 cm irregular solid mass with washout kinetics.  There was also enhancement measuring 7 x 4 x 3 mm in the middle third of the 9 o'clock position of the left breast.  2 suspicious right  axillary nodes were present.  It appears that she did undergo repeat biopsy on 01/21/2018 revealing an invasive mammary carcinoma consistent with a lobular phenotype.  On 01/27/2018 she also underwent another biopsy in the posterior upper outer quadrant revealing an invasive lobular carcinoma with atypical lobular hyperplasia, a 6:00 location also revealed LCIS, and her left breast was biopsied revealing fibrocystic change.  She was started on letrozole as she was not quite ready to commit to surgical intervention.  She did ultimately choose to proceed with right mastectomy on 06/21/2018 revealing a grade two 7 cm invasive lobular carcinoma.  5 out of 6 of the sampled lymph nodes were positive for disease and her margins were negative.  She returned to the operating room on 07/13/2018, and 3 out of the 10 sampled lymph nodes removed at that time were also positive.  She began systemic chemotherapy on 08/10/18 and completed her last cycle 12/28/2018 of Taxol.  She is seen today via my chart to discuss options of adjuvant radiotherapy.   PREVIOUS RADIATION THERAPY: No   PAST MEDICAL HISTORY:  Past Medical History:  Diagnosis Date   Arthritis    Breast cancer in female [facility])    Right   Cancer ([place])    Diabetes mellitus without complication ([place])    Family history of breast cancer    Family history of colon cancer    Hypertension    takes fluid pills   Hypothyroidism    Pink eye disease of left eye    went to eye  md and received drops - yesterday.   Sleep apnea    back in the 1990's   Vitiligo        PAST SURGICAL HISTORY: Past Surgical History:  Procedure Laterality Date   AXILLARY LYMPH NODE DISSECTION Right 07/13/2018   Procedure: RIGHT AXILLARY LYMPH NODE DISSECTION;  Surgeon: [name], MD;  Location: [place];  Service: General;  Laterality: Right;   BREAST SURGERY     left breast fibro adenoma   CATARACT EXTRACTION Right 2019   CERVICAL FUSION N/A    C5 &6    CESAREAN SECTION     CHOLECYSTECTOMY  2004-2005   CYSTECTOMY     patient denies   EYE SURGERY     IR IMAGING GUIDED PORT INSERTION  07/29/2018   MASTECTOMY WITH RADIOACTIVE SEED GUIDED EXCISION AND AXILLARY SENTINEL LYMPH NODE BIOPSY Right 06/21/2018   MASTECTOMY WITH RADIOACTIVE SEED GUIDED EXCISION AND AXILLARY SENTINEL LYMPH NODE BIOPSY Right 06/21/2018   Procedure: RIGHT TOTAL MASTECTOMY WITH RIGHT AXILLARY SENTINEL NODE BIOPSY AND RIGHT SEED GUIDED NODE EXCISION;  Surgeon: [name], MD;  Location: [place];  Service: General;  Laterality: Right;   [place] CYST EXCISION     PORTACATH PLACEMENT N/A 07/13/2018   Procedure: INSERTION PORT-A-CATH WITH ULTRASOUND;  Surgeon: [name], MD;  Location: [place];  Service: General;  Laterality: N/A;   TONSILLECTOMY  2002   UVULOPALATOPHARYNGOPLASTY, TONSILLECTOMY AND SEPTOPLASTY       FAMILY HISTORY:  Family History  Problem Relation Age of Onset   Colon cancer Mother 3   Colon cancer Sister 29   Colon cancer Brother        ex early 38's   Breast cancer Other        23's     SOCIAL HISTORY:  reports that she has never smoked. She has never used smokeless tobacco. She reports previous alcohol use. She reports that she does not use drugs. The patient is married and lives in [place], [place]. 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 She has been working remotely.    ALLERGIES: Patient has no known allergies.   MEDICATIONS:  Current Outpatient Medications  Medication Sig Dispense Refill   aspirin EC 81 MG tablet Take 81 mg by mouth daily.     celecoxib (CELEBREX) 200 MG capsule Take 200 mg by mouth daily.     cholecalciferol (VITAMIN D3) 25 MCG (1000 UT) tablet Take 1 tablet (1,000 Units total) by mouth daily.     furosemide (LASIX) 40 MG tablet Take 80 mg by mouth daily.     gabapentin (NEURONTIN) 400 MG capsule Take 400  mg by mouth 2 (two) times daily.     levothyroxine (SYNTHROID, LEVOTHROID) 125 MCG tablet Take 125 mcg by mouth daily before breakfast.     lidocaine-prilocaine (EMLA) cream Apply to affected area once 30 g 3   LORazepam (ATIVAN) 0.5 MG tablet Take 1 tablet (0.5 mg total) by mouth at bedtime as needed (Nausea or vomiting). 30 tablet 0   magic mouthwash SOLN Take 5 mLs by mouth 4 (four) times daily as needed for mouth pain. 240 mL 2   metFORMIN (GLUCOPHAGE) 1000 MG tablet Take 1,000 mg by mouth 2 (two) times daily with a meal.     ondansetron (ZOFRAN) 8 MG tablet Take 1 tablet (8 mg total) by mouth 2 (two) times daily as needed. Start on the third day  after chemotherapy. 30 tablet 1   pantoprazole (PROTONIX) 40 MG tablet Take 1 tablet by mouth once daily 30 tablet 0   potassium chloride (K-DUR,KLOR-CON) 10 MEQ tablet Take 20 mEq by mouth daily.      prochlorperazine (COMPAZINE) 10 MG tablet Take 1 tablet (10 mg total) by mouth every 6 (six) hours as needed (Nausea or vomiting). 30 tablet 1   rosuvastatin (CRESTOR) 40 MG tablet Take 40 mg by mouth at bedtime.      valACYclovir (VALTREX) 500 MG tablet Take 1 tablet (500 mg total) by mouth 2 (two) times daily. 60 tablet 1   No current facility-administered medications for this encounter.      REVIEW OF SYSTEMS: On review of systems, the patient reports that she is doing well overall. She feels like she tolerated chemotherapy well, but was a bit tired. She denies any chest pain, shortness of breath, cough, fevers, chills, night sweats, unintended weight changes. She denies any bowel or bladder disturbances, and denies abdominal pain, nausea or vomiting. She denies any new musculoskeletal or joint aches or pains. A complete review of systems is obtained and is otherwise negative.     PHYSICAL EXAM:  Wt Readings from Last 3 Encounters:  12/28/18 223 lb 3.2 oz (101.2 kg)  12/21/18 219 lb 1.9 oz (99.4 kg)  12/14/18 219 lb 11.2 oz (99.7 kg)    Temp Readings from Last 3 Encounters:  12/28/18 98.7 F (37.1 C) (Oral)  12/25/18 98 F (36.7 C) (Oral)  12/21/18 98.2 F (36.8 C) (Tympanic)   BP Readings from Last 3 Encounters:  12/28/18 137/79  12/25/18 (!) 156/90  12/21/18 (!) 145/81   Pulse Readings from Last 3 Encounters:  12/28/18 (!) 109  12/25/18 (!) 103  12/21/18 (!) 108   In general this is a well appearing caucasian female in no acute distress. She's alert and oriented x4 and appropriate throughout the examination. Cardiopulmonary assessment is negative for acute distress and she exhibits normal effort. Chest wall exam is deferred.   ECOG = 1  0 - Asymptomatic (Fully active, able to carry on all predisease activities without restriction)  1 - Symptomatic but completely ambulatory (Restricted in physically strenuous activity but ambulatory and able to carry out work of a light or sedentary nature. For example, light housework, office work)  2 - Symptomatic, <50% in bed during the day (Ambulatory and capable of all self care but unable to carry out any work activities. Up and about more than 50% of waking hours)  3 - Symptomatic, >50% in bed, but not bedbound (Capable of only limited self-care, confined to bed or chair 50% or more of waking hours)  4 - Bedbound (Completely disabled. Cannot carry on any self-care. Totally confined to bed or chair)  5 - Death   Eustace Pen MM, Creech RH, Tormey DC, et al. [id number]). "Toxicity and response criteria of the [facility]". [place] Oncol. 5 (6): 649-55    LABORATORY DATA:  Lab Results  Component Value Date   WBC 11.7 (H) 12/28/2018   HGB 10.6 (L) 12/28/2018   HCT 33.0 (L) 12/28/2018   MCV 96.2 12/28/2018   PLT 230 12/28/2018   Lab Results  Component Value Date   NA 145 12/28/2018   K 4.0 12/28/2018   CL 110 12/28/2018   CO2 24 12/28/2018   Lab Results  Component Value Date   ALT 25 12/28/2018   AST 17 12/28/2018   ALKPHOS 99  12/28/2018  BILITOT 0.3 12/28/2018      RADIOGRAPHY: No results found.     IMPRESSION/PLAN: 1. 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 Dr. Lisbeth Renshaw discusses the final pathology findings and reviews the nature of invasive breast disease. She is doing well since completing surgery and chemotherapy.  We discussed the risks, benefits, short, and long term effects of radiotherapy, and the patient is interested in proceeding. Dr. Lisbeth Renshaw discusses the delivery and logistics of radiotherapy and anticipates a course of 6 1/2 weeks of radiotherapy to the chest wall and regional nodes. She was originally to proceed with simulation tomorrow but we will plan to push this out a few weeks due to the timing of chemo. She is in agreement and we will proceed on 01/14/2019 with plans to proceed with treatment the week of 01/24/2019. She will sign written consent that day but gives verbal agreement to proceed.    This encounter was provided by telemedicine platform Mychart.  The patient has given verbal consent for this type of encounter and has been advised to only accept a meeting of this type in a secure network environment. The time spent during this encounter was 35 minutes. The attendants for this meeting include Charline Bills, LPN, Dr. Lisbeth Renshaw, Hayden Pedro  and [facility]ou [facility] During the encounter,  Charline Bills, LPN, Dr. Lisbeth Renshaw, and Hayden Pedro were located at [facility] Radiation Oncology Department.  Noa Galvao Piscitello was located at home.      Carola Rhine, PAC

## 2018-12-31 ENCOUNTER — Ambulatory Visit: Payer: BC Managed Care – PPO | Admitting: Radiation Oncology

## 2019-01-14 ENCOUNTER — Ambulatory Visit
Admission: RE | Admit: 2019-01-14 | Discharge: 2019-01-14 | Disposition: A | Discharge status: Home / Self Care | Payer: BC Managed Care – PPO | Source: Ambulatory Visit | Attending: Radiation Oncology | Admitting: Radiation Oncology

## 2019-01-14 ENCOUNTER — Other Ambulatory Visit: Payer: Self-pay

## 2019-01-14 DIAGNOSIS — C50411 Malignant neoplasm of upper-outer quadrant of right female breast: Secondary | ICD-10-CM | POA: Diagnosis present

## 2019-01-14 DIAGNOSIS — Z170 Estrogen receptor positive status [ER+]: Secondary | ICD-10-CM | POA: Insufficient documentation

## 2019-01-14 DIAGNOSIS — Z510 Encounter for antineoplastic radiation therapy: Secondary | ICD-10-CM | POA: Diagnosis not present

## 2019-01-15 HISTORY — PX: BREAST BIOPSY: SHX20

## 2019-01-18 ENCOUNTER — Telehealth: Payer: Self-pay | Admitting: Hematology and Oncology

## 2019-01-18 NOTE — Telephone Encounter (Signed)
Scheduled appt per 8/03 sch message - pt aware of appt d/t

## 2019-01-20 DIAGNOSIS — Z510 Encounter for antineoplastic radiation therapy: Secondary | ICD-10-CM | POA: Insufficient documentation

## 2019-01-20 DIAGNOSIS — C50411 Malignant neoplasm of upper-outer quadrant of right female breast: Secondary | ICD-10-CM | POA: Insufficient documentation

## 2019-01-20 DIAGNOSIS — Z170 Estrogen receptor positive status [ER+]: Secondary | ICD-10-CM | POA: Diagnosis not present

## 2019-01-21 ENCOUNTER — Ambulatory Visit
Admission: RE | Admit: 2019-01-21 | Discharge: 2019-01-21 | Disposition: A | Discharge status: Home / Self Care | Payer: BC Managed Care – PPO | Source: Ambulatory Visit | Attending: Radiation Oncology | Admitting: Radiation Oncology

## 2019-01-21 ENCOUNTER — Other Ambulatory Visit: Payer: Self-pay

## 2019-01-21 DIAGNOSIS — C50411 Malignant neoplasm of upper-outer quadrant of right female breast: Secondary | ICD-10-CM | POA: Diagnosis not present

## 2019-01-24 ENCOUNTER — Ambulatory Visit
Admission: RE | Admit: 2019-01-24 | Discharge: 2019-01-24 | Disposition: A | Discharge status: Home / Self Care | Payer: BC Managed Care – PPO | Source: Ambulatory Visit | Attending: Radiation Oncology | Admitting: Radiation Oncology

## 2019-01-24 ENCOUNTER — Other Ambulatory Visit: Payer: Self-pay

## 2019-01-24 ENCOUNTER — Ambulatory Visit: Payer: BC Managed Care – PPO

## 2019-01-24 DIAGNOSIS — C50411 Malignant neoplasm of upper-outer quadrant of right female breast: Secondary | ICD-10-CM | POA: Diagnosis not present

## 2019-01-25 ENCOUNTER — Other Ambulatory Visit: Payer: Self-pay

## 2019-01-25 ENCOUNTER — Ambulatory Visit: Payer: BC Managed Care – PPO

## 2019-01-25 ENCOUNTER — Ambulatory Visit
Admission: RE | Admit: 2019-01-25 | Discharge: 2019-01-25 | Disposition: A | Discharge status: Home / Self Care | Payer: BC Managed Care – PPO | Source: Ambulatory Visit | Attending: Radiation Oncology | Admitting: Radiation Oncology

## 2019-01-25 DIAGNOSIS — C50411 Malignant neoplasm of upper-outer quadrant of right female breast: Secondary | ICD-10-CM | POA: Diagnosis not present

## 2019-01-26 ENCOUNTER — Ambulatory Visit
Admission: RE | Admit: 2019-01-26 | Discharge: 2019-01-26 | Disposition: A | Discharge status: Home / Self Care | Payer: BC Managed Care – PPO | Source: Ambulatory Visit | Attending: Radiation Oncology | Admitting: Radiation Oncology

## 2019-01-26 ENCOUNTER — Other Ambulatory Visit: Payer: Self-pay

## 2019-01-26 ENCOUNTER — Ambulatory Visit: Payer: BC Managed Care – PPO

## 2019-01-26 DIAGNOSIS — C50411 Malignant neoplasm of upper-outer quadrant of right female breast: Secondary | ICD-10-CM | POA: Diagnosis not present

## 2019-01-27 ENCOUNTER — Ambulatory Visit: Payer: BC Managed Care – PPO

## 2019-01-27 ENCOUNTER — Ambulatory Visit
Admission: RE | Admit: 2019-01-27 | Discharge: 2019-01-27 | Disposition: A | Discharge status: Home / Self Care | Payer: BC Managed Care – PPO | Source: Ambulatory Visit | Attending: Radiation Oncology | Admitting: Radiation Oncology

## 2019-01-27 ENCOUNTER — Other Ambulatory Visit: Payer: Self-pay

## 2019-01-27 DIAGNOSIS — C50411 Malignant neoplasm of upper-outer quadrant of right female breast: Secondary | ICD-10-CM | POA: Diagnosis not present

## 2019-01-28 ENCOUNTER — Ambulatory Visit: Payer: BC Managed Care – PPO

## 2019-01-28 ENCOUNTER — Other Ambulatory Visit: Payer: Self-pay

## 2019-01-28 ENCOUNTER — Ambulatory Visit
Admission: RE | Admit: 2019-01-28 | Discharge: 2019-01-28 | Disposition: A | Discharge status: Home / Self Care | Payer: BC Managed Care – PPO | Source: Ambulatory Visit | Attending: Radiation Oncology | Admitting: Radiation Oncology

## 2019-01-28 DIAGNOSIS — Z170 Estrogen receptor positive status [ER+]: Secondary | ICD-10-CM

## 2019-01-28 DIAGNOSIS — C50411 Malignant neoplasm of upper-outer quadrant of right female breast: Secondary | ICD-10-CM

## 2019-01-28 MED ORDER — RADIAPLEXRX EX GEL
Freq: Once | CUTANEOUS | Status: AC
  Administered 2019-01-28: 16:00:00 via TOPICAL

## 2019-01-28 MED ORDER — ALRA NON-METALLIC DEODORANT (RAD-ONC)
1.0000 "application " | Freq: Once | TOPICAL | Status: AC
  Administered 2019-01-28: 1 via TOPICAL

## 2019-01-28 NOTE — Progress Notes (Signed)
Pt here for patient teaching.  Pt given Radiation and You booklet, skin care instructions, Alra deodorant and Radiaplex gel.  Reviewed areas of pertinence such as fatigue, hair loss, skin changes, breast tenderness and breast swelling . Pt able to give teach back of to pat skin and use unscented/gentle soap,apply Radiaplex bid, avoid applying anything to skin within 4 hours of treatment, avoid wearing an under wire bra and to use an electric razor if they must shave. Pt verbalized understanding of information given and will contact nursing with any questions or concerns.     Gloriajean Dell. Leonie Green, BSN

## 2019-01-31 ENCOUNTER — Ambulatory Visit
Admission: RE | Admit: 2019-01-31 | Discharge: 2019-01-31 | Disposition: A | Discharge status: Home / Self Care | Payer: BC Managed Care – PPO | Source: Ambulatory Visit | Attending: Radiation Oncology | Admitting: Radiation Oncology

## 2019-01-31 ENCOUNTER — Ambulatory Visit: Payer: BC Managed Care – PPO

## 2019-01-31 ENCOUNTER — Other Ambulatory Visit: Payer: Self-pay

## 2019-01-31 DIAGNOSIS — C50411 Malignant neoplasm of upper-outer quadrant of right female breast: Secondary | ICD-10-CM | POA: Diagnosis not present

## 2019-02-01 ENCOUNTER — Other Ambulatory Visit: Payer: Self-pay

## 2019-02-01 ENCOUNTER — Ambulatory Visit
Admission: RE | Admit: 2019-02-01 | Discharge: 2019-02-01 | Disposition: A | Discharge status: Home / Self Care | Payer: BC Managed Care – PPO | Source: Ambulatory Visit | Attending: Radiation Oncology | Admitting: Radiation Oncology

## 2019-02-01 ENCOUNTER — Ambulatory Visit: Payer: BC Managed Care – PPO

## 2019-02-01 DIAGNOSIS — C50411 Malignant neoplasm of upper-outer quadrant of right female breast: Secondary | ICD-10-CM | POA: Diagnosis not present

## 2019-02-02 ENCOUNTER — Other Ambulatory Visit: Payer: Self-pay

## 2019-02-02 ENCOUNTER — Ambulatory Visit: Payer: BC Managed Care – PPO

## 2019-02-02 ENCOUNTER — Ambulatory Visit
Admission: RE | Admit: 2019-02-02 | Discharge: 2019-02-02 | Disposition: A | Discharge status: Home / Self Care | Payer: BC Managed Care – PPO | Source: Ambulatory Visit | Attending: Radiation Oncology | Admitting: Radiation Oncology

## 2019-02-02 DIAGNOSIS — C50411 Malignant neoplasm of upper-outer quadrant of right female breast: Secondary | ICD-10-CM | POA: Diagnosis not present

## 2019-02-03 ENCOUNTER — Ambulatory Visit: Payer: BC Managed Care – PPO

## 2019-02-03 ENCOUNTER — Ambulatory Visit
Admission: RE | Admit: 2019-02-03 | Discharge: 2019-02-03 | Disposition: A | Discharge status: Home / Self Care | Payer: BC Managed Care – PPO | Source: Ambulatory Visit | Attending: Radiation Oncology | Admitting: Radiation Oncology

## 2019-02-03 ENCOUNTER — Other Ambulatory Visit: Payer: Self-pay

## 2019-02-03 DIAGNOSIS — C50411 Malignant neoplasm of upper-outer quadrant of right female breast: Secondary | ICD-10-CM | POA: Diagnosis not present

## 2019-02-03 NOTE — Progress Notes (Signed)
  Radiation Oncology         [phone] ________________________________  Name: Charlene Perry MRN: [id number]  Date: 01/14/2019  DOB: Mar 07, 1951  Optical Surface Tracking Plan:  Since intensity modulated radiotherapy (IMRT) and 3D conformal radiation treatment methods are predicated on accurate and precise positioning for treatment, intrafraction motion monitoring is medically necessary to ensure accurate and safe treatment delivery.  The ability to quantify intrafraction motion without excessive ionizing radiation dose can only be performed with optical surface tracking. Accordingly, surface imaging offers the opportunity to obtain 3D measurements of patient position throughout IMRT and 3D treatments without excessive radiation exposure.  I am ordering optical surface tracking for this patient's upcoming course of radiotherapy. ________________________________  [name], MD 02/03/2019 6:07 PM    Reference:   Particia Jasper, et al. Surface imaging-based analysis of intrafraction motion for breast radiotherapy patients.Journal of [place], n. 6, nov. 2014. ISSN [id number].   Available at: <http://www.jacmp.org/index.php/jacmp/article/view/4957>.

## 2019-02-03 NOTE — Progress Notes (Signed)
  Radiation Oncology         [phone] ________________________________  Name: Charlene Perry MRN: [id number]  Date: 01/14/2019  DOB: [birth year]  DIAGNOSIS:     ICD-10-CM   1. Malignant neoplasm of upper-outer quadrant of right breast in female, estrogen receptor positive ([place])  C50.411    Z17.0      SIMULATION AND TREATMENT PLANNING NOTE  The patient presented for simulation prior to beginning her course of radiation treatment for her diagnosis of right-sided breast cancer. The patient was placed in a supine position on a breast board. A customized vac-lock bag was also constructed and this complex treatment device will be used on a daily basis during her treatment. In this fashion, a CT scan was obtained through the chest area and an isocenter was placed near the chest wall at the upper aspect of the right chest.  The patient will be planned to receive a course of radiation initially to a dose of 50.4 gray. This will consist of a 4 field technique targeting the right chest wall as well as the supraclavicular region. Therefore 2 customized medial and lateral tangent fields have been created targeting the chest wall, and also 2 additional customized fields have been designed to treat the supraclavicular region both with a right supraclavicular field and a right posterior axillary boost field. A forward planning/reduced field technique will also be evaluated to determine if this significantly improves the dose homogeneity of the overall plan. Therefore, additional customized blocks/fields may be necessary.  This initial treatment will be accomplished at 1.8 gray per fraction.   The initial plan will consist of a 3-D conformal technique. The target volume/scar, heart and lungs have been contoured and dose volume histograms of each of these structures will be evaluated as part of the 3-D conformal treatment planning process.   It is anticipated that the patient will then receive a 10 gray  boost to the surgical scar. This will be accomplished at 2 gray per fraction. The final anticipated total dose therefore will correspond to 60.4 gray.    _______________________________   Jodelle Gross, MD, PhD

## 2019-02-04 ENCOUNTER — Ambulatory Visit: Payer: BC Managed Care – PPO

## 2019-02-04 ENCOUNTER — Other Ambulatory Visit: Payer: Self-pay

## 2019-02-04 ENCOUNTER — Ambulatory Visit
Admission: RE | Admit: 2019-02-04 | Discharge: 2019-02-04 | Disposition: A | Discharge status: Home / Self Care | Payer: BC Managed Care – PPO | Source: Ambulatory Visit | Attending: Radiation Oncology | Admitting: Radiation Oncology

## 2019-02-04 DIAGNOSIS — C50411 Malignant neoplasm of upper-outer quadrant of right female breast: Secondary | ICD-10-CM | POA: Diagnosis not present

## 2019-02-07 ENCOUNTER — Other Ambulatory Visit: Payer: Self-pay

## 2019-02-07 ENCOUNTER — Ambulatory Visit
Admission: RE | Admit: 2019-02-07 | Discharge: 2019-02-07 | Disposition: A | Discharge status: Home / Self Care | Payer: BC Managed Care – PPO | Source: Ambulatory Visit | Attending: Radiation Oncology | Admitting: Radiation Oncology

## 2019-02-07 ENCOUNTER — Ambulatory Visit: Payer: BC Managed Care – PPO

## 2019-02-07 DIAGNOSIS — C50411 Malignant neoplasm of upper-outer quadrant of right female breast: Secondary | ICD-10-CM | POA: Diagnosis not present

## 2019-02-08 ENCOUNTER — Ambulatory Visit: Payer: BC Managed Care – PPO

## 2019-02-08 ENCOUNTER — Other Ambulatory Visit: Payer: Self-pay

## 2019-02-08 ENCOUNTER — Ambulatory Visit
Admission: RE | Admit: 2019-02-08 | Discharge: 2019-02-08 | Disposition: A | Discharge status: Home / Self Care | Payer: BC Managed Care – PPO | Source: Ambulatory Visit | Attending: Radiation Oncology | Admitting: Radiation Oncology

## 2019-02-08 DIAGNOSIS — C50411 Malignant neoplasm of upper-outer quadrant of right female breast: Secondary | ICD-10-CM | POA: Diagnosis not present

## 2019-02-09 ENCOUNTER — Ambulatory Visit: Payer: BC Managed Care – PPO

## 2019-02-09 ENCOUNTER — Other Ambulatory Visit: Payer: Self-pay

## 2019-02-09 ENCOUNTER — Ambulatory Visit
Admission: RE | Admit: 2019-02-09 | Discharge: 2019-02-09 | Disposition: A | Discharge status: Home / Self Care | Payer: BC Managed Care – PPO | Source: Ambulatory Visit | Attending: Radiation Oncology | Admitting: Radiation Oncology

## 2019-02-09 DIAGNOSIS — C50411 Malignant neoplasm of upper-outer quadrant of right female breast: Secondary | ICD-10-CM | POA: Diagnosis not present

## 2019-02-10 ENCOUNTER — Other Ambulatory Visit: Payer: Self-pay

## 2019-02-10 ENCOUNTER — Ambulatory Visit: Payer: BC Managed Care – PPO

## 2019-02-10 ENCOUNTER — Ambulatory Visit
Admission: RE | Admit: 2019-02-10 | Discharge: 2019-02-10 | Disposition: A | Discharge status: Home / Self Care | Payer: BC Managed Care – PPO | Source: Ambulatory Visit | Attending: Radiation Oncology | Admitting: Radiation Oncology

## 2019-02-10 DIAGNOSIS — C50411 Malignant neoplasm of upper-outer quadrant of right female breast: Secondary | ICD-10-CM | POA: Diagnosis not present

## 2019-02-11 ENCOUNTER — Other Ambulatory Visit: Payer: Self-pay

## 2019-02-11 ENCOUNTER — Ambulatory Visit
Admission: RE | Admit: 2019-02-11 | Discharge: 2019-02-11 | Disposition: A | Discharge status: Home / Self Care | Payer: BC Managed Care – PPO | Source: Ambulatory Visit | Attending: Radiation Oncology | Admitting: Radiation Oncology

## 2019-02-11 ENCOUNTER — Ambulatory Visit: Payer: BC Managed Care – PPO

## 2019-02-11 DIAGNOSIS — C50411 Malignant neoplasm of upper-outer quadrant of right female breast: Secondary | ICD-10-CM | POA: Diagnosis not present

## 2019-02-14 ENCOUNTER — Ambulatory Visit: Payer: BC Managed Care – PPO

## 2019-02-14 ENCOUNTER — Ambulatory Visit
Admission: RE | Admit: 2019-02-14 | Discharge: 2019-02-14 | Disposition: A | Discharge status: Home / Self Care | Payer: BC Managed Care – PPO | Source: Ambulatory Visit | Attending: Radiation Oncology | Admitting: Radiation Oncology

## 2019-02-14 DIAGNOSIS — C50411 Malignant neoplasm of upper-outer quadrant of right female breast: Secondary | ICD-10-CM | POA: Diagnosis not present

## 2019-02-15 ENCOUNTER — Other Ambulatory Visit: Payer: Self-pay

## 2019-02-15 ENCOUNTER — Ambulatory Visit: Payer: BC Managed Care – PPO

## 2019-02-15 ENCOUNTER — Ambulatory Visit
Admission: RE | Admit: 2019-02-15 | Discharge: 2019-02-15 | Disposition: A | Discharge status: Home / Self Care | Payer: BC Managed Care – PPO | Source: Ambulatory Visit | Attending: Radiation Oncology | Admitting: Radiation Oncology

## 2019-02-15 DIAGNOSIS — C50411 Malignant neoplasm of upper-outer quadrant of right female breast: Secondary | ICD-10-CM | POA: Insufficient documentation

## 2019-02-15 DIAGNOSIS — Z170 Estrogen receptor positive status [ER+]: Secondary | ICD-10-CM | POA: Insufficient documentation

## 2019-02-15 DIAGNOSIS — Z510 Encounter for antineoplastic radiation therapy: Secondary | ICD-10-CM | POA: Diagnosis not present

## 2019-02-16 ENCOUNTER — Ambulatory Visit
Admission: RE | Admit: 2019-02-16 | Discharge: 2019-02-16 | Disposition: A | Discharge status: Home / Self Care | Payer: BC Managed Care – PPO | Source: Ambulatory Visit | Attending: Radiation Oncology | Admitting: Radiation Oncology

## 2019-02-16 ENCOUNTER — Other Ambulatory Visit: Payer: Self-pay

## 2019-02-16 ENCOUNTER — Ambulatory Visit: Payer: BC Managed Care – PPO

## 2019-02-16 DIAGNOSIS — C50411 Malignant neoplasm of upper-outer quadrant of right female breast: Secondary | ICD-10-CM | POA: Diagnosis not present

## 2019-02-17 ENCOUNTER — Ambulatory Visit: Payer: BC Managed Care – PPO

## 2019-02-17 ENCOUNTER — Ambulatory Visit
Admission: RE | Admit: 2019-02-17 | Discharge: 2019-02-17 | Disposition: A | Discharge status: Home / Self Care | Payer: BC Managed Care – PPO | Source: Ambulatory Visit | Attending: Radiation Oncology | Admitting: Radiation Oncology

## 2019-02-17 DIAGNOSIS — Z170 Estrogen receptor positive status [ER+]: Secondary | ICD-10-CM

## 2019-02-17 DIAGNOSIS — C50411 Malignant neoplasm of upper-outer quadrant of right female breast: Secondary | ICD-10-CM | POA: Diagnosis not present

## 2019-02-17 MED ORDER — RADIAPLEXRX EX GEL
Freq: Once | CUTANEOUS | Status: AC
  Administered 2019-02-17: 16:00:00 via TOPICAL

## 2019-02-18 ENCOUNTER — Other Ambulatory Visit: Payer: Self-pay

## 2019-02-18 ENCOUNTER — Ambulatory Visit: Payer: BC Managed Care – PPO

## 2019-02-18 ENCOUNTER — Ambulatory Visit
Admission: RE | Admit: 2019-02-18 | Discharge: 2019-02-18 | Disposition: A | Discharge status: Home / Self Care | Payer: BC Managed Care – PPO | Source: Ambulatory Visit | Attending: Radiation Oncology | Admitting: Radiation Oncology

## 2019-02-18 DIAGNOSIS — C50411 Malignant neoplasm of upper-outer quadrant of right female breast: Secondary | ICD-10-CM | POA: Diagnosis not present

## 2019-02-22 ENCOUNTER — Ambulatory Visit
Admission: RE | Admit: 2019-02-22 | Discharge: 2019-02-22 | Disposition: A | Discharge status: Home / Self Care | Payer: BC Managed Care – PPO | Source: Ambulatory Visit | Attending: Radiation Oncology | Admitting: Radiation Oncology

## 2019-02-22 ENCOUNTER — Ambulatory Visit: Payer: BC Managed Care – PPO

## 2019-02-22 ENCOUNTER — Other Ambulatory Visit: Payer: Self-pay

## 2019-02-22 DIAGNOSIS — C50411 Malignant neoplasm of upper-outer quadrant of right female breast: Secondary | ICD-10-CM | POA: Diagnosis not present

## 2019-02-23 ENCOUNTER — Ambulatory Visit
Admission: RE | Admit: 2019-02-23 | Discharge: 2019-02-23 | Disposition: A | Discharge status: Home / Self Care | Payer: BC Managed Care – PPO | Source: Ambulatory Visit | Attending: Radiation Oncology | Admitting: Radiation Oncology

## 2019-02-23 ENCOUNTER — Ambulatory Visit: Payer: BC Managed Care – PPO

## 2019-02-23 ENCOUNTER — Other Ambulatory Visit: Payer: Self-pay

## 2019-02-23 DIAGNOSIS — C50411 Malignant neoplasm of upper-outer quadrant of right female breast: Secondary | ICD-10-CM | POA: Diagnosis not present

## 2019-02-24 ENCOUNTER — Ambulatory Visit
Admission: RE | Admit: 2019-02-24 | Discharge: 2019-02-24 | Disposition: A | Discharge status: Home / Self Care | Payer: BC Managed Care – PPO | Source: Ambulatory Visit | Attending: Radiation Oncology | Admitting: Radiation Oncology

## 2019-02-24 ENCOUNTER — Other Ambulatory Visit: Payer: Self-pay

## 2019-02-24 ENCOUNTER — Ambulatory Visit: Payer: BC Managed Care – PPO

## 2019-02-24 DIAGNOSIS — C50411 Malignant neoplasm of upper-outer quadrant of right female breast: Secondary | ICD-10-CM | POA: Diagnosis not present

## 2019-02-25 ENCOUNTER — Ambulatory Visit
Admission: RE | Admit: 2019-02-25 | Discharge: 2019-02-25 | Disposition: A | Discharge status: Home / Self Care | Payer: BC Managed Care – PPO | Source: Ambulatory Visit | Attending: Radiation Oncology | Admitting: Radiation Oncology

## 2019-02-25 ENCOUNTER — Other Ambulatory Visit: Payer: Self-pay

## 2019-02-25 ENCOUNTER — Ambulatory Visit: Payer: BC Managed Care – PPO

## 2019-02-25 DIAGNOSIS — C50411 Malignant neoplasm of upper-outer quadrant of right female breast: Secondary | ICD-10-CM | POA: Diagnosis not present

## 2019-02-28 ENCOUNTER — Ambulatory Visit
Admission: RE | Admit: 2019-02-28 | Discharge: 2019-02-28 | Disposition: A | Discharge status: Home / Self Care | Payer: BC Managed Care – PPO | Source: Ambulatory Visit | Attending: Radiation Oncology | Admitting: Radiation Oncology

## 2019-02-28 ENCOUNTER — Other Ambulatory Visit: Payer: Self-pay

## 2019-02-28 ENCOUNTER — Ambulatory Visit: Payer: BC Managed Care – PPO

## 2019-02-28 DIAGNOSIS — C50411 Malignant neoplasm of upper-outer quadrant of right female breast: Secondary | ICD-10-CM | POA: Diagnosis not present

## 2019-03-01 ENCOUNTER — Ambulatory Visit: Payer: BC Managed Care – PPO

## 2019-03-01 ENCOUNTER — Other Ambulatory Visit: Payer: Self-pay

## 2019-03-01 ENCOUNTER — Ambulatory Visit
Admission: RE | Admit: 2019-03-01 | Discharge: 2019-03-01 | Disposition: A | Discharge status: Home / Self Care | Payer: BC Managed Care – PPO | Source: Ambulatory Visit | Attending: Radiation Oncology | Admitting: Radiation Oncology

## 2019-03-01 DIAGNOSIS — C50411 Malignant neoplasm of upper-outer quadrant of right female breast: Secondary | ICD-10-CM | POA: Diagnosis not present

## 2019-03-02 ENCOUNTER — Other Ambulatory Visit: Payer: Self-pay

## 2019-03-02 ENCOUNTER — Ambulatory Visit: Payer: BC Managed Care – PPO

## 2019-03-02 ENCOUNTER — Ambulatory Visit
Admission: RE | Admit: 2019-03-02 | Discharge: 2019-03-02 | Disposition: A | Discharge status: Home / Self Care | Payer: BC Managed Care – PPO | Source: Ambulatory Visit | Attending: Radiation Oncology | Admitting: Radiation Oncology

## 2019-03-02 DIAGNOSIS — C50411 Malignant neoplasm of upper-outer quadrant of right female breast: Secondary | ICD-10-CM | POA: Diagnosis not present

## 2019-03-03 ENCOUNTER — Ambulatory Visit
Admission: RE | Admit: 2019-03-03 | Discharge: 2019-03-03 | Disposition: A | Discharge status: Home / Self Care | Payer: BC Managed Care – PPO | Source: Ambulatory Visit | Attending: Radiation Oncology | Admitting: Radiation Oncology

## 2019-03-03 ENCOUNTER — Ambulatory Visit: Payer: BC Managed Care – PPO

## 2019-03-03 DIAGNOSIS — C50411 Malignant neoplasm of upper-outer quadrant of right female breast: Secondary | ICD-10-CM | POA: Diagnosis not present

## 2019-03-04 ENCOUNTER — Ambulatory Visit
Admission: RE | Admit: 2019-03-04 | Discharge: 2019-03-04 | Disposition: A | Discharge status: Home / Self Care | Payer: BC Managed Care – PPO | Source: Ambulatory Visit | Attending: Radiation Oncology | Admitting: Radiation Oncology

## 2019-03-04 ENCOUNTER — Ambulatory Visit: Payer: BC Managed Care – PPO

## 2019-03-04 ENCOUNTER — Other Ambulatory Visit: Payer: Self-pay

## 2019-03-04 DIAGNOSIS — C50411 Malignant neoplasm of upper-outer quadrant of right female breast: Secondary | ICD-10-CM | POA: Diagnosis not present

## 2019-03-06 NOTE — Progress Notes (Signed)
Patient Care Team: [name], MD as PCP - General (Family Medicine)  DIAGNOSIS:    ICD-10-CM   1. Malignant neoplasm of upper-outer quadrant of right breast in female, estrogen receptor positive ([place])  C50.411    Z17.0     SUMMARY OF ONCOLOGIC HISTORY: Oncology History  Malignant neoplasm of upper-outer quadrant of right breast in female, estrogen receptor positive ([place])  11/12/2017 Initial Diagnosis   Palpable lesion in the right breast upper outer quadrant mammogram and ultrasound revealed 1.4 cm tumor ultrasound-guided biopsy revealed grade 2 IDC ER 3+, PR 3+, HER-2 negative with a Ki-67 of 10%.  Right axillary lymph node biopsy positive, T1cN1 stage I a clinical stage   12/23/2017 Cancer Staging   Staging form: Breast, AJCC 8th Edition - Clinical: Stage IB (cT1c, cN1, cM0, G2, ER+, PR+, HER2-) - Signed by Gardenia Phlegm, NP on 12/23/2017   01/10/2018 Breast MRI   Biopsy-proven malignancy UOQ right breast measuring 6 cm.  Anterior inferior to this is a 1 cm mass which needs biopsy.  Focal non-mass enhancement LOQ retroareolar right breast, left breast 7 mm irregular enhancement, 2 suspicious right axillary lymph nodes   01/27/2018 Procedure   Right breast biopsy UOQ: Grade 1 ILC, ALH Right breast biopsy 6:00: LCIS Left breast biopsy: Benign   06/21/2018 Surgery   Rt Mastectomy: 7 cm fibrotic mass ILC Grade 2, 5/6 LN Positive, ER 90-100%, PR 5%-70%, Ki 67 2-10%, Her 2 Neg, T3N2   07/13/2018 Surgery   Right axillary lymph node dissection: 3/10 lymph nodes positive making total number of lymph nodes 8/16   08/10/2018 - 12/28/2018 Chemotherapy   Adjuvant chemotherapy with dose dense Adriamycin and Cytoxan x4 followed by Taxol weekly x12   01/24/2019 -  Radiation Therapy   Adjuvant radiation treatment     CHIEF COMPLIANT: Follow-up during radiation to discuss anti-estrogen treatment  INTERVAL HISTORY: Charlene Perry is a 68 y.o. with above-mentioned  history of right breast cancer who underwent a right mastectomy, adjuvant chemotherapy, and is currently undergoing radiation.She presents to the clinic todayto discuss anti-estrogen treatment.   REVIEW OF SYSTEMS:   Constitutional: Denies fevers, chills or abnormal weight loss Eyes: Denies blurriness of vision Ears, nose, mouth, throat, and face: Denies mucositis or sore throat Respiratory: Denies cough, dyspnea or wheezes Cardiovascular: Denies palpitation, chest discomfort Gastrointestinal: Denies nausea, heartburn or change in bowel habits Skin: Denies abnormal skin rashes Lymphatics: Denies new lymphadenopathy or easy bruising Neurological: Denies numbness, tingling or new weaknesses Behavioral/Psych: Mood is stable, no new changes  Extremities: No lower extremity edema Breast: denies any pain or lumps or nodules in either breasts All other systems were reviewed with the patient and are negative.  I have reviewed the past medical history, past surgical history, social history and family history with the patient and they are unchanged from previous note.  ALLERGIES:  has No Known Allergies.  MEDICATIONS:  Current Outpatient Medications  Medication Sig Dispense Refill  . aspirin EC 81 MG tablet Take 81 mg by mouth daily.    . celecoxib (CELEBREX) 200 MG capsule Take 200 mg by mouth daily.    . cholecalciferol (VITAMIN D3) 25 MCG (1000 UT) tablet Take 1 tablet (1,000 Units total) by mouth daily.    . furosemide (LASIX) 40 MG tablet Take 80 mg by mouth daily.    Marland Kitchen gabapentin (NEURONTIN) 400 MG capsule Take 400 mg by mouth 2 (two) times daily.    Marland Kitchen levothyroxine (SYNTHROID, LEVOTHROID) 125 MCG tablet  Take 125 mcg by mouth daily before breakfast.    . lidocaine-prilocaine (EMLA) cream Apply to affected area once (Patient not taking: Reported on 12/30/2018) 30 g 3  . LORazepam (ATIVAN) 0.5 MG tablet Take 1 tablet (0.5 mg total) by mouth at bedtime as needed (Nausea or vomiting).  (Patient not taking: Reported on 12/30/2018) 30 tablet 0  . magic mouthwash SOLN Take 5 mLs by mouth 4 (four) times daily as needed for mouth pain. (Patient not taking: Reported on 12/30/2018) 240 mL 2  . metFORMIN (GLUCOPHAGE) 1000 MG tablet Take 1,000 mg by mouth 2 (two) times daily with a meal.    . ondansetron (ZOFRAN) 8 MG tablet Take 1 tablet (8 mg total) by mouth 2 (two) times daily as needed. Start on the third day after chemotherapy. (Patient not taking: Reported on 12/30/2018) 30 tablet 1  . pantoprazole (PROTONIX) 40 MG tablet Take 1 tablet by mouth once daily 30 tablet 0  . potassium chloride (K-DUR,KLOR-CON) 10 MEQ tablet Take 20 mEq by mouth daily.     . prochlorperazine (COMPAZINE) 10 MG tablet Take 1 tablet (10 mg total) by mouth every 6 (six) hours as needed (Nausea or vomiting). (Patient not taking: Reported on 12/30/2018) 30 tablet 1  . rosuvastatin (CRESTOR) 40 MG tablet Take 40 mg by mouth at bedtime.     . valACYclovir (VALTREX) 500 MG tablet Take 1 tablet (500 mg total) by mouth 2 (two) times daily. 60 tablet 1   No current facility-administered medications for this visit.     PHYSICAL EXAMINATION: ECOG PERFORMANCE STATUS: 1 - Symptomatic but completely ambulatory  There were no vitals filed for this visit. There were no vitals filed for this visit.  GENERAL: alert, no distress and comfortable SKIN: skin color, texture, turgor are normal, no rashes or significant lesions EYES: normal, Conjunctiva are pink and non-injected, sclera clear OROPHARYNX: no exudate, no erythema and lips, buccal mucosa, and tongue normal  NECK: supple, thyroid normal size, non-tender, without nodularity LYMPH: no palpable lymphadenopathy in the cervical, axillary or inguinal LUNGS: clear to auscultation and percussion with normal breathing effort HEART: regular rate & rhythm and no murmurs and no lower extremity edema ABDOMEN: abdomen soft, non-tender and normal bowel sounds MUSCULOSKELETAL: no  cyanosis of digits and no clubbing  NEURO: alert & oriented x 3 with fluent speech, no focal motor/sensory deficits EXTREMITIES: No lower extremity edema  LABORATORY DATA:  I have reviewed the data as listed CMP Latest Ref Rng & Units 12/28/2018 12/21/2018 12/14/2018  Glucose 70 - 99 mg/dL 170(H) 215(H) 163(H)  BUN 8 - 23 mg/dL _0 Creatinine 0.44 - 1.00 mg/dL 1.14(H) 1.17(H) 1.09(H)  Sodium 135 - 145 mmol/L 145 141 143  Potassium 3.5 - 5.1 mmol/L 4.0 4.0 3.8  Chloride 98 - 111 mmol/L 110 101 107  CO2 22 - 32 mmol/L _1 Calcium 8.9 - 10.3 mg/dL 9.1 9.5 9.1  Total Protein 6.5 - 8.1 g/dL 7.0 6.9 6.9  Total Bilirubin 0.3 - 1.2 mg/dL 0.3 0.4 0.4  Alkaline Phos 38 - 126 U/L 99 101 94  AST 15 - 41 U/L _2 ALT 0 - 44 U/L _3 Lab Results  Component Value Date   WBC 11.7 (H) 12/28/2018   HGB 10.6 (L) 12/28/2018   HCT 33.0 (L) 12/28/2018   MCV 96.2 12/28/2018   PLT 230 12/28/2018   NEUTROABS 9.7 (H) 12/28/2018    ASSESSMENT &  PLAN:  Malignant neoplasm of upper-outer quadrant of right breast in female, estrogen receptor positive ([place]) 06/21/17:Rt Mastectomy: 7 cm fibrotic mass ILC Grade 2, 5/6 LN Positive, ER 90-100%, PR 5%-70%, Ki 67 2-10%, Her 2 Neg, T3N2 Treatment plan: 1. Adj chemo with dose dense Adriamycin and Cytoxan followed by Taxol x12 (08/10/2018-12/28/2018) 2. Adj RT started 01/24/2019-03/07/2019 3. Adj Anti estrogen therapy  CT CAP: 07/02/2018: Subtle hypoattenuating lesion upper pole left kidney could be a cyst, urology consulted, left adrenal adenoma. --------------------------------------------------------------------------------------------------------------------- Current treatment: Adjuvant antiestrogen therapy with anastrozole 1 mg daily to start 03/17/2019 Anastrozole toxicities:We discussed the risks and benefits of anti-estrogen therapy with aromatase inhibitors. These include but not limited to insomnia, hot flashes, mood changes, vaginal  dryness, bone density loss, and weight gain. We strongly believe that the benefits far outweigh the risks. Patient understands these risks and consented to starting treatment. Planned treatment duration is 7 years.  patient wants to consider plastic surgery.  I sent a message to Dr. Iran Planas to call her and discuss her options. Return to clinic in 2 months for survivorship care plan visit    No orders of the defined types were placed in this encounter.  The patient has a good understanding of the overall plan. she agrees with it. she will call with any problems that may develop before the next visit here.  [name], MD 03/07/2019  Julious Oka Dorshimer am acting as scribe for Dr. [name].  I have reviewed the above documentation for accuracy and completeness, and I agree with the above.

## 2019-03-07 ENCOUNTER — Ambulatory Visit
Admission: RE | Admit: 2019-03-07 | Discharge: 2019-03-07 | Disposition: A | Discharge status: Home / Self Care | Payer: BC Managed Care – PPO | Source: Ambulatory Visit | Attending: Radiation Oncology | Admitting: Radiation Oncology

## 2019-03-07 ENCOUNTER — Other Ambulatory Visit: Payer: Self-pay

## 2019-03-07 ENCOUNTER — Inpatient Hospital Stay: Payer: BC Managed Care – PPO | Attending: Hematology and Oncology | Admitting: Hematology and Oncology

## 2019-03-07 ENCOUNTER — Ambulatory Visit: Payer: BC Managed Care – PPO

## 2019-03-07 DIAGNOSIS — Z9221 Personal history of antineoplastic chemotherapy: Secondary | ICD-10-CM | POA: Insufficient documentation

## 2019-03-07 DIAGNOSIS — Z170 Estrogen receptor positive status [ER+]: Secondary | ICD-10-CM | POA: Insufficient documentation

## 2019-03-07 DIAGNOSIS — Z9011 Acquired absence of right breast and nipple: Secondary | ICD-10-CM | POA: Diagnosis not present

## 2019-03-07 DIAGNOSIS — C50411 Malignant neoplasm of upper-outer quadrant of right female breast: Secondary | ICD-10-CM | POA: Insufficient documentation

## 2019-03-07 DIAGNOSIS — Z79899 Other long term (current) drug therapy: Secondary | ICD-10-CM | POA: Insufficient documentation

## 2019-03-07 MED ORDER — LETROZOLE 2.5 MG PO TABS: 2.5000 mg | ORAL_TABLET | Freq: Every day | ORAL | 3 refills | Status: DC

## 2019-03-07 NOTE — Assessment & Plan Note (Addendum)
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 Adj chemo with dose dense Adriamycin and Cytoxan followed by Taxol x12 (08/10/2018-12/28/2018) 2. Adj RT started 01/24/2019-03/07/2019 3. Adj Anti estrogen therapy  CT CAP: 07/02/2018: Subtle hypoattenuating lesion upper pole left kidney could be a cyst, urology consulted, left adrenal adenoma. --------------------------------------------------------------------------------------------------------------------- Current treatment: Adjuvant antiestrogen therapy with anastrozole 1 mg daily to start 03/17/2019 Anastrozole toxicities:We discussed the risks and benefits of anti-estrogen therapy with aromatase inhibitors. These include but not limited to insomnia, hot flashes, mood changes, vaginal dryness, bone density loss, and weight gain. We strongly believe that the benefits far outweigh the risks. Patient understands these risks and consented to starting treatment. Planned treatment duration is 7 years.  Return to clinic in 3 months for survivorship care plan visit

## 2019-03-08 ENCOUNTER — Other Ambulatory Visit: Payer: Self-pay

## 2019-03-08 ENCOUNTER — Ambulatory Visit
Admission: RE | Admit: 2019-03-08 | Discharge: 2019-03-08 | Disposition: A | Discharge status: Home / Self Care | Payer: BC Managed Care – PPO | Source: Ambulatory Visit | Attending: Radiation Oncology | Admitting: Radiation Oncology

## 2019-03-08 ENCOUNTER — Ambulatory Visit: Payer: BC Managed Care – PPO

## 2019-03-08 DIAGNOSIS — C50411 Malignant neoplasm of upper-outer quadrant of right female breast: Secondary | ICD-10-CM | POA: Diagnosis not present

## 2019-03-09 ENCOUNTER — Other Ambulatory Visit: Payer: Self-pay

## 2019-03-09 ENCOUNTER — Ambulatory Visit: Payer: BC Managed Care – PPO

## 2019-03-09 ENCOUNTER — Ambulatory Visit
Admission: RE | Admit: 2019-03-09 | Discharge: 2019-03-09 | Disposition: A | Discharge status: Home / Self Care | Payer: BC Managed Care – PPO | Source: Ambulatory Visit | Attending: Radiation Oncology | Admitting: Radiation Oncology

## 2019-03-09 DIAGNOSIS — C50411 Malignant neoplasm of upper-outer quadrant of right female breast: Secondary | ICD-10-CM | POA: Diagnosis not present

## 2019-03-10 ENCOUNTER — Ambulatory Visit
Admission: RE | Admit: 2019-03-10 | Discharge: 2019-03-10 | Disposition: A | Discharge status: Home / Self Care | Payer: BC Managed Care – PPO | Source: Ambulatory Visit | Attending: Radiation Oncology | Admitting: Radiation Oncology

## 2019-03-10 ENCOUNTER — Telehealth: Payer: Self-pay | Admitting: Adult Health

## 2019-03-10 ENCOUNTER — Encounter: Payer: Self-pay | Admitting: *Deleted

## 2019-03-10 ENCOUNTER — Other Ambulatory Visit: Payer: Self-pay

## 2019-03-10 ENCOUNTER — Ambulatory Visit: Payer: BC Managed Care – PPO

## 2019-03-10 ENCOUNTER — Encounter: Payer: Self-pay | Admitting: Radiation Oncology

## 2019-03-10 DIAGNOSIS — C50411 Malignant neoplasm of upper-outer quadrant of right female breast: Secondary | ICD-10-CM | POA: Diagnosis not present

## 2019-03-10 NOTE — Telephone Encounter (Signed)
Scheduled appt per 9/22 sch message - - mailed letter with appt date and time

## 2019-03-17 ENCOUNTER — Telehealth: Payer: Self-pay | Admitting: Adult Health

## 2019-03-17 NOTE — Telephone Encounter (Signed)
Returned patient's phone call regarding rescheduling December appointments, per patient's request appointment has been moved to 12/14.   Message to provider.

## 2019-03-17 NOTE — Telephone Encounter (Signed)
I guess.  When was it supposed to be originally?

## 2019-04-05 ENCOUNTER — Telehealth: Payer: Self-pay | Admitting: Radiation Oncology

## 2019-04-05 NOTE — Telephone Encounter (Signed)
  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  Narrative:  The patient was contacted today for routine follow-up. During treatment she did very well with radiotherapy and did not have significant desquamation. She reports she is doing well. She feels as though her skin is healing nicely and she's back to routine activity.   Impression/Plan: 1. 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 The patient has been doing well since completion of radiotherapy. We discussed that we would be happy to continue to follow her as needed, but she will also continue to follow up with Dr. Lindi Adie in medical oncology and is seeing Dr. Leland Johns today to discuss reconstruction. She was counseled on skin care as well as measures to avoid sun exposure to this area.     Carola Rhine, PAC

## 2019-04-06 DIAGNOSIS — Z923 Personal history of irradiation: Secondary | ICD-10-CM | POA: Insufficient documentation

## 2019-05-04 NOTE — Progress Notes (Signed)
  Radiation Oncology         (336) [phone] ________________________________  Name: Charlene Perry MRN: [id number]  Date: 03/10/2019  DOB: [birth year]  End of Treatment Note  Diagnosis:   right-sided breast cancer     Indication for treatment:  Curative       Radiation treatment dates:   01/24/19 - 03/10/19  Site/dose:   The patient initially received a dose of 50.4 Gy in 28 fractions to the chest wall and supraclavicular region. This was delivered using a 3-D conformal, 4 field technique. The patient then received a boost to the mastectomy scar. This delivered an additional 10 Gy in 5 fractions using an en face electron field. The total dose was 60.4 Gy.  Narrative: The patient tolerated radiation treatment relatively well.   The patient had some expected skin irritation as she progressed during treatment. Moist desquamation was not present at the end of treatment.  Plan: The patient has completed radiation treatment. The patient will return to radiation oncology clinic for routine followup in one month. I advised the patient to call or return sooner if they have any questions or concerns related to their recovery or treatment. ________________________________  Jodelle Gross, M.D., Ph.D.

## 2019-05-09 ENCOUNTER — Telehealth: Payer: Self-pay | Admitting: Adult Health

## 2019-05-09 NOTE — Telephone Encounter (Signed)
I talk with patient regarding video visit °

## 2019-05-10 ENCOUNTER — Telehealth: Payer: Self-pay | Admitting: Hematology and Oncology

## 2019-05-10 NOTE — Telephone Encounter (Signed)
Scheduled appt per 11/24 sch message - pt is aware of appt date and time   

## 2019-05-16 ENCOUNTER — Telehealth: Payer: Self-pay | Admitting: Adult Health

## 2019-05-16 ENCOUNTER — Inpatient Hospital Stay: Payer: BC Managed Care – PPO | Attending: Hematology and Oncology

## 2019-05-16 ENCOUNTER — Other Ambulatory Visit: Payer: Self-pay

## 2019-05-16 DIAGNOSIS — Z9221 Personal history of antineoplastic chemotherapy: Secondary | ICD-10-CM | POA: Diagnosis not present

## 2019-05-16 DIAGNOSIS — Z170 Estrogen receptor positive status [ER+]: Secondary | ICD-10-CM | POA: Diagnosis not present

## 2019-05-16 DIAGNOSIS — Z95828 Presence of other vascular implants and grafts: Secondary | ICD-10-CM

## 2019-05-16 DIAGNOSIS — Z79899 Other long term (current) drug therapy: Secondary | ICD-10-CM | POA: Diagnosis not present

## 2019-05-16 DIAGNOSIS — C50411 Malignant neoplasm of upper-outer quadrant of right female breast: Secondary | ICD-10-CM | POA: Insufficient documentation

## 2019-05-16 DIAGNOSIS — C50011 Malignant neoplasm of nipple and areola, right female breast: Secondary | ICD-10-CM

## 2019-05-16 MED ORDER — SODIUM CHLORIDE 0.9% FLUSH
10.0000 mL | Freq: Once | INTRAVENOUS | Status: AC
  Administered 2019-05-16: 10 mL
  Filled 2019-05-16: qty 10

## 2019-05-16 MED ORDER — HEPARIN SOD (PORK) LOCK FLUSH 100 UNIT/ML IV SOLN
500.0000 [IU] | Freq: Once | INTRAVENOUS | Status: AC
  Administered 2019-05-16: 13:00:00 500 [IU]
  Filled 2019-05-16: qty 5

## 2019-05-16 NOTE — Telephone Encounter (Signed)
I talk with patient regarding change from 12/14 to 12/16

## 2019-05-30 ENCOUNTER — Telehealth: Payer: BC Managed Care – PPO | Admitting: Adult Health

## 2019-06-01 ENCOUNTER — Inpatient Hospital Stay: Payer: BC Managed Care – PPO | Attending: Hematology and Oncology | Admitting: Adult Health

## 2019-06-01 ENCOUNTER — Encounter: Payer: Self-pay | Admitting: Adult Health

## 2019-06-01 DIAGNOSIS — Z803 Family history of malignant neoplasm of breast: Secondary | ICD-10-CM | POA: Insufficient documentation

## 2019-06-01 DIAGNOSIS — Z9221 Personal history of antineoplastic chemotherapy: Secondary | ICD-10-CM | POA: Diagnosis not present

## 2019-06-01 DIAGNOSIS — Z79811 Long term (current) use of aromatase inhibitors: Secondary | ICD-10-CM | POA: Insufficient documentation

## 2019-06-01 DIAGNOSIS — Z79899 Other long term (current) drug therapy: Secondary | ICD-10-CM | POA: Diagnosis not present

## 2019-06-01 DIAGNOSIS — E2839 Other primary ovarian failure: Secondary | ICD-10-CM | POA: Diagnosis not present

## 2019-06-01 DIAGNOSIS — Z170 Estrogen receptor positive status [ER+]: Secondary | ICD-10-CM | POA: Diagnosis not present

## 2019-06-01 DIAGNOSIS — C50411 Malignant neoplasm of upper-outer quadrant of right female breast: Secondary | ICD-10-CM | POA: Insufficient documentation

## 2019-06-01 DIAGNOSIS — Z800 Family history of malignant neoplasm of digestive organs: Secondary | ICD-10-CM | POA: Diagnosis not present

## 2019-06-01 DIAGNOSIS — Z9011 Acquired absence of right breast and nipple: Secondary | ICD-10-CM | POA: Insufficient documentation

## 2019-06-01 DIAGNOSIS — M2550 Pain in unspecified joint: Secondary | ICD-10-CM | POA: Diagnosis not present

## 2019-06-01 DIAGNOSIS — L80 Vitiligo: Secondary | ICD-10-CM | POA: Diagnosis not present

## 2019-06-01 NOTE — Progress Notes (Signed)
SURVIVORSHIP VIRTUAL VISIT:  I connected with Charlene Perry on 06/01/19 at 11:30 AM EST by my chart video and verified that I am speaking with the correct person using two identifiers.  I discussed the limitations, risks, security and privacy concerns of performing an evaluation and management service by telephone and the availability of in person appointments. I also discussed with the patient that there may be a patient responsible charge related to this service. The patient expressed understanding and agreed to proceed.   Patient location: At home in private location Provider location:  At [facility] in office  BRIEF ONCOLOGIC HISTORY:  Oncology History  Malignant neoplasm of upper-outer quadrant of right breast in female, estrogen receptor positive ([place])  11/12/2017 Initial Diagnosis   Palpable lesion in the right breast upper outer quadrant mammogram and ultrasound revealed 1.4 cm tumor ultrasound-guided biopsy revealed grade 2 IDC ER 3+, PR 3+, HER-2 negative with a Ki-67 of 10%.  Right axillary lymph node biopsy positive, T1cN1 stage I a clinical stage   12/23/2017 Cancer Staging   Staging form: Breast, AJCC 8th Edition - Clinical: Stage IB (cT1c, cN1, cM0, G2, ER+, PR+, HER2-) - Signed by Gardenia Phlegm, NP on 12/23/2017   01/10/2018 Breast MRI   Biopsy-proven malignancy UOQ right breast measuring 6 cm.  Anterior inferior to this is a 1 cm mass which needs biopsy.  Focal non-mass enhancement LOQ retroareolar right breast, left breast 7 mm irregular enhancement, 2 suspicious right axillary lymph nodes   01/27/2018 Procedure   Right breast biopsy UOQ: Grade 1 ILC, ALH Right breast biopsy 6:00: LCIS Left breast biopsy: Benign   06/21/2018 Surgery   Rt Mastectomy: 7 cm fibrotic mass ILC Grade 2, 5/6 LN Positive, ER 90-100%, PR 5%-70%, Ki 67 2-10%, Her 2 Neg, T3N2   07/13/2018 Surgery   Right axillary lymph node dissection: 3/10 lymph nodes positive making total number of lymph  nodes 8/16   08/10/2018 - 12/28/2018 Chemotherapy   Adjuvant chemotherapy with dose dense Adriamycin and Cytoxan x4 followed by Taxol weekly x12   01/24/2019 -  Radiation Therapy   Adjuvant radiation treatment   03/2019 -  Anti-estrogen oral therapy   Letrozole daily     INTERVAL HISTORY:  Ms. Breese to review her survivorship care plan detailing her treatment course for breast cancer, as well as monitoring long-term side effects of that treatment, education regarding health maintenance, screening, and overall wellness and health promotion.     Overall, [name] reports feeling quite well.  She is taking Letrozole every evening.  She does note some arthralgias and vaginal dryness.  She is using vitamin e suppositories prescribed by her GYN.  She uses this PRN.  She says her arthralgias she had attributed to age.  She says that she is walking.  She notes the achy feeling is worse in the morning, and improves after getting loosened up.  She is taking Celebrex daily and Gabapentin BID.  REVIEW OF SYSTEMS:  Review of Systems  Constitutional: Negative for appetite change, chills, fatigue, fever and unexpected weight change.  HENT:   Negative for hearing loss, lump/mass and trouble swallowing.   Eyes: Negative for eye problems and icterus.  Respiratory: Negative for chest tightness, cough and shortness of breath.   Cardiovascular: Negative for chest pain, leg swelling and palpitations.  Gastrointestinal: Negative for abdominal distention, abdominal pain, constipation, diarrhea, nausea and vomiting.  Endocrine: Negative for hot flashes.  Musculoskeletal: Positive for arthralgias.  Skin: Negative for itching and rash.  Neurological: Negative for dizziness, extremity weakness, headaches and numbness.  Hematological: Negative for adenopathy. Does not bruise/bleed easily.  Psychiatric/Behavioral: Negative for depression. The patient is not nervous/anxious.   Breast: Denies any new  nodularity, masses, tenderness, nipple changes, or nipple discharge.      ONCOLOGY TREATMENT TEAM:  1. Surgeon:  Dr. Donne Hazel at [facility] 2. Medical Oncologist: Dr. Lindi Adie  3. Radiation Oncologist: Dr. Lisbeth Renshaw    PAST MEDICAL/SURGICAL HISTORY:  Past Medical History:  Diagnosis Date  . Arthritis   . Breast cancer in female [facility])    Right  . Cancer ([place])   . Diabetes mellitus without complication ([place])   . Family history of breast cancer   . Family history of colon cancer   . Hypertension    takes fluid pills  . Hypothyroidism   . Pink eye disease of left eye    went to eye md and received drops - yesterday.  . Sleep apnea    back in the 1990's  . Vitiligo    Past Surgical History:  Procedure Laterality Date  . AXILLARY LYMPH NODE DISSECTION Right 07/13/2018   Procedure: RIGHT AXILLARY LYMPH NODE DISSECTION;  Surgeon: [name], MD;  Location: [place];  Service: General;  Laterality: Right;  . BREAST SURGERY     left breast fibro adenoma  . CATARACT EXTRACTION Right 2019  . CERVICAL FUSION N/A    C5 &6  . CESAREAN SECTION    . CHOLECYSTECTOMY  2004-2005  . CYSTECTOMY     patient denies  . EYE SURGERY    . IR IMAGING GUIDED PORT INSERTION  07/29/2018  . MASTECTOMY WITH RADIOACTIVE SEED GUIDED EXCISION AND AXILLARY SENTINEL LYMPH NODE BIOPSY Right 06/21/2018  . MASTECTOMY WITH RADIOACTIVE SEED GUIDED EXCISION AND AXILLARY SENTINEL LYMPH NODE BIOPSY Right 06/21/2018   Procedure: RIGHT TOTAL MASTECTOMY WITH RIGHT AXILLARY SENTINEL NODE BIOPSY AND RIGHT SEED GUIDED NODE EXCISION;  Surgeon: [name], MD;  Location: [place];  Service: General;  Laterality: Right;  . MYOMECTOMY  1986  . PARTIAL HYSTERECTOMY  1995  . PILONIDAL CYST EXCISION    . PORTACATH PLACEMENT N/A 07/13/2018   Procedure: INSERTION PORT-A-CATH WITH ULTRASOUND;  Surgeon: [name], MD;  Location: [place];  Service: General;  Laterality: N/A;  . TONSILLECTOMY  2002  .  UVULOPALATOPHARYNGOPLASTY, TONSILLECTOMY AND SEPTOPLASTY       ALLERGIES:  No Known Allergies   CURRENT MEDICATIONS:  Outpatient Encounter Medications as of 06/01/2019  Medication Sig Note  . aspirin EC 81 MG tablet Take 81 mg by mouth daily. 07/06/2018: On hold due to procedures.  . celecoxib (CELEBREX) 200 MG capsule Take 200 mg by mouth daily.   . cholecalciferol (VITAMIN D3) 25 MCG (1000 UT) tablet Take 1 tablet (1,000 Units total) by mouth daily.   . furosemide (LASIX) 40 MG tablet Take 80 mg by mouth daily.   Marland Kitchen gabapentin (NEURONTIN) 400 MG capsule Take 400 mg by mouth 2 (two) times daily.   Marland Kitchen letrozole (FEMARA) 2.5 MG tablet Take 1 tablet (2.5 mg total) by mouth daily.   Marland Kitchen levothyroxine (SYNTHROID, LEVOTHROID) 125 MCG tablet Take 125 mcg by mouth daily before breakfast.   . metFORMIN (GLUCOPHAGE) 1000 MG tablet Take 1,000 mg by mouth 2 (two) times daily with a meal.   . ondansetron (ZOFRAN) 8 MG tablet Take 1 tablet (8 mg total) by mouth 2 (two) times daily as needed. Start on the third day after chemotherapy. (Patient not taking: Reported on  12/30/2018) 07/06/2018: On hold until chemotherapy regime starts   . rosuvastatin (CRESTOR) 40 MG tablet Take 40 mg by mouth at bedtime.     No facility-administered encounter medications on file as of 06/01/2019.     ONCOLOGIC FAMILY HISTORY:  Family History  Problem Relation Age of Onset  . Colon cancer Mother 73  . Colon cancer Sister 57  . Colon cancer Brother        ex early 38's  . Breast cancer Other        40's     GENETIC COUNSELING/TESTING: See above  SOCIAL HISTORY:  Social History   Socioeconomic History  . Marital status: Married    Spouse name: Not on file  . Number of children: Not on file  . Years of education: Not on file  . Highest education level: Not on file  Occupational History  . Not on file  Tobacco Use  . Smoking status: Never Smoker  . Smokeless tobacco: Never Used  Substance and Sexual  Activity  . Alcohol use: Not Currently  . Drug use: Never  . Sexual activity: Not on file  Other Topics Concern  . Not on file  Social History Narrative  . Not on file   Social Determinants of Health   Financial Resource Strain:   . Difficulty of Paying Living Expenses: Not on file  Food Insecurity:   . Worried About Charity fundraiser in the Last Year: Not on file  . Ran Out of Food in the Last Year: Not on file  Transportation Needs:   . Lack of Transportation (Medical): Not on file  . Lack of Transportation (Non-Medical): Not on file  Physical Activity:   . Days of Exercise per Week: Not on file  . Minutes of Exercise per Session: Not on file  Stress:   . Feeling of Stress : Not on file  Social Connections:   . Frequency of Communication with Friends and Family: Not on file  . Frequency of Social Gatherings with Friends and Family: Not on file  . Attends Religious Services: Not on file  . Active Member of Clubs or Organizations: Not on file  . Attends Archivist Meetings: Not on file  . Marital Status: Not on file  Intimate Partner Violence:   . Fear of Current or Ex-Partner: Not on file  . Emotionally Abused: Not on file  . Physically Abused: Not on file  . Sexually Abused: Not on file     OBSERVATIONS/OBJECTIVE:  Patient appears well.  She is in no apparent distress.  Mood and behavior are normal.  Her breathing is non labored.  Skin visualized without rash or lesion.    LABORATORY DATA:  None for this visit.  DIAGNOSTIC IMAGING:  None for this visit.      ASSESSMENT AND PLAN:  Ms.. Stopka is a pleasant 68 y.o. female with Stage IB right/ breast invasive ductal carcinoma, ER+/PR+/HER2-, diagnosed in 10/2017, treated with mastectomy, axillary lymph node dissection, adjuvant chemotherapy, adjuvant radiation therapy, and anti-estrogen therapy with Letrozole beginning in 03/2019.  She presents to the Survivorship Clinic for our initial meeting and  routine follow-up post-completion of treatment for breast cancer.    1. Stage IB right breast cancer:  Ms. Emmerich is continuing to recover from definitive treatment for breast cancer. She will follow-up with her medical oncologist, Dr. Lindi Adie in 3 months with history and physical exam per surveillance protocol.  She will continue her anti-estrogen therapy with Letrozole. Thus  far, she is tolerating the Letrozole well, with minimal side effects (see #2). She was instructed to make Dr. Lindi Adie or myself aware if she begins to experience any worsening side effects of the medication and I could see her back in clinic to help manage those side effects, as needed. Her left breast screening mammogram is due and I placed orders for this today. Today, a comprehensive survivorship care plan and treatment summary was reviewed with the patient today detailing her breast cancer diagnosis, treatment course, potential late/long-term effects of treatment, appropriate follow-up care with recommendations for the future, and patient education resources.  A copy of this summary, along with a letter will be sent to the patient's primary care provider via mail/fax/In Basket message after today's visit.    2. Arthralgias: Patient is taking celebrex daily which helps.  She was recommended to continue with exercise, and to give the Letrozole another few weeks to see if the achiness improves.  I recommended that if they worsen, for her to call us, as we can trial a different anti estrogen therapy after a brief time off from the Letrozole.    3. Bone health:  Given [name]'s age/history of breast cancer and her current treatment regimen including anti-estrogen therapy with Letrozole, she is at risk for bone demineralization.  She has not undergone bone density testing and I placed orders for this to be completed at the breast center.  She was given education on specific activities to promote bone health.  Due to her  vitiligo and having taken Vitamin D before, we will check a vitamin d level at her next appointment before recommending her to take any supplementation orally.  4. Cancer screening:  Due to [name]'s history and her age, she should receive screening for skin cancers, colon cancer, and gynecologic cancers.  The information and recommendations are listed on the patient's comprehensive care plan/treatment summary and were reviewed in detail with the patient.    5. Health maintenance and wellness promotion: Ms. Salamone was encouraged to consume 5-7 servings of fruits and vegetables per day. We reviewed the "Nutrition Rainbow" handout, as well as the handout "Take Control of Your Health and Reduce Your Cancer Risk" from the [place].  She was also encouraged to engage in moderate to vigorous exercise for 30 minutes per day most days of the week. We discussed the [employer] fitness program, which is designed for cancer survivors to help them become more physically fit after cancer treatments.  She was instructed to limit her alcohol consumption and continue to abstain from tobacco use.  We discussed safe sun practices such as using sunscreen, avoiding direct sunlight, and avoiding tanning beds.   6. Support services/counseling: It is not uncommon for this period of the patient's cancer care trajectory to be one of many emotions and stressors.  We discussed how this can be increasingly difficult during the times of quarantine and social distancing due to the COVID-19 pandemic.   She was given information regarding our available services and encouraged to contact me with any questions or for help enrolling in any of our support group/programs.    Follow up instructions:    -Return to cancer center in 3 months for lab and f/u with Dr. Lindi Adie  -Mammogram ordered today -Bone density testing ordered today -She is welcome to return back to the Survivorship Clinic at any time; no  additional follow-up needed at this time.  -Consider referral back to survivorship as a long-term  survivor for continued surveillance  The patient was provided an opportunity to ask questions and all were answered. The patient agreed with the plan and demonstrated an understanding of the instructions.   The patient was advised to call back or seek an in-person evaluation if the symptoms worsen or if the condition fails to improve as anticipated.   I provided 45 minutes of face-to-face video visit time during this encounter, and > 50% was spent counseling as documented under my assessment & plan.  Scot Dock, NP

## 2019-06-02 ENCOUNTER — Telehealth: Payer: Self-pay | Admitting: Hematology and Oncology

## 2019-06-02 NOTE — Telephone Encounter (Signed)
I talk with patient regarding schedule  

## 2019-06-09 ENCOUNTER — Encounter: Payer: BC Managed Care – PPO | Admitting: Adult Health

## 2019-06-29 ENCOUNTER — Telehealth: Payer: Self-pay | Admitting: Hematology and Oncology

## 2019-06-29 NOTE — Telephone Encounter (Signed)
Returned patient's phone call regarding cancelling 01/15 appointment, per patient's request appointment has been cancelled due to Covid-19.

## 2019-07-01 ENCOUNTER — Inpatient Hospital Stay: Payer: BC Managed Care – PPO

## 2019-08-12 ENCOUNTER — Inpatient Hospital Stay: Payer: BC Managed Care – PPO | Attending: Hematology and Oncology

## 2019-08-12 ENCOUNTER — Other Ambulatory Visit: Payer: Self-pay

## 2019-08-12 DIAGNOSIS — Z9221 Personal history of antineoplastic chemotherapy: Secondary | ICD-10-CM | POA: Insufficient documentation

## 2019-08-12 DIAGNOSIS — M2550 Pain in unspecified joint: Secondary | ICD-10-CM | POA: Diagnosis not present

## 2019-08-12 DIAGNOSIS — Z170 Estrogen receptor positive status [ER+]: Secondary | ICD-10-CM | POA: Diagnosis not present

## 2019-08-12 DIAGNOSIS — Z95828 Presence of other vascular implants and grafts: Secondary | ICD-10-CM

## 2019-08-12 DIAGNOSIS — C50411 Malignant neoplasm of upper-outer quadrant of right female breast: Secondary | ICD-10-CM | POA: Insufficient documentation

## 2019-08-12 DIAGNOSIS — C50011 Malignant neoplasm of nipple and areola, right female breast: Secondary | ICD-10-CM

## 2019-08-12 MED ORDER — HEPARIN SOD (PORK) LOCK FLUSH 100 UNIT/ML IV SOLN
500.0000 [IU] | Freq: Once | INTRAVENOUS | Status: AC
  Administered 2019-08-12: 16:00:00 500 [IU]
  Filled 2019-08-12: qty 5

## 2019-08-12 MED ORDER — SODIUM CHLORIDE 0.9% FLUSH
10.0000 mL | Freq: Once | INTRAVENOUS | Status: AC
  Administered 2019-08-12: 10 mL
  Filled 2019-08-12: qty 10

## 2019-08-25 ENCOUNTER — Ambulatory Visit
Admission: RE | Admit: 2019-08-25 | Discharge: 2019-08-25 | Disposition: A | Discharge status: Home / Self Care | Payer: BC Managed Care – PPO | Source: Ambulatory Visit | Attending: Adult Health | Admitting: Adult Health

## 2019-08-25 ENCOUNTER — Other Ambulatory Visit: Payer: Self-pay

## 2019-08-25 DIAGNOSIS — E2839 Other primary ovarian failure: Secondary | ICD-10-CM

## 2019-08-25 DIAGNOSIS — C50411 Malignant neoplasm of upper-outer quadrant of right female breast: Secondary | ICD-10-CM

## 2019-08-25 DIAGNOSIS — Z170 Estrogen receptor positive status [ER+]: Secondary | ICD-10-CM

## 2019-08-26 ENCOUNTER — Telehealth: Payer: Self-pay | Admitting: *Deleted

## 2019-08-26 NOTE — Telephone Encounter (Signed)
Per Wilber Bihari NP, called to make pt aware her bone density scan came back negative with no evidence of osteoporosis or osteopenia. Pt was appreciative and verbalized understanding.

## 2019-09-02 ENCOUNTER — Other Ambulatory Visit: Payer: Self-pay

## 2019-09-02 ENCOUNTER — Emergency Department (HOSPITAL_COMMUNITY): Payer: BC Managed Care – PPO

## 2019-09-02 ENCOUNTER — Encounter (HOSPITAL_COMMUNITY): Payer: Self-pay

## 2019-09-02 ENCOUNTER — Inpatient Hospital Stay (HOSPITAL_COMMUNITY)
Admission: EM | Admit: 2019-09-02 | Discharge: 2019-09-09 | DRG: 871 | Disposition: A | Discharge status: Home Health | Payer: BC Managed Care – PPO | Attending: Internal Medicine | Admitting: Internal Medicine

## 2019-09-02 DIAGNOSIS — Z8616 Personal history of COVID-19: Secondary | ICD-10-CM | POA: Diagnosis not present

## 2019-09-02 DIAGNOSIS — N10 Acute tubulo-interstitial nephritis: Secondary | ICD-10-CM | POA: Diagnosis not present

## 2019-09-02 DIAGNOSIS — Z9049 Acquired absence of other specified parts of digestive tract: Secondary | ICD-10-CM | POA: Diagnosis not present

## 2019-09-02 DIAGNOSIS — Z79811 Long term (current) use of aromatase inhibitors: Secondary | ICD-10-CM | POA: Diagnosis not present

## 2019-09-02 DIAGNOSIS — E785 Hyperlipidemia, unspecified: Secondary | ICD-10-CM | POA: Diagnosis present

## 2019-09-02 DIAGNOSIS — Z7984 Long term (current) use of oral hypoglycemic drugs: Secondary | ICD-10-CM | POA: Diagnosis not present

## 2019-09-02 DIAGNOSIS — Z803 Family history of malignant neoplasm of breast: Secondary | ICD-10-CM | POA: Diagnosis not present

## 2019-09-02 DIAGNOSIS — Z9221 Personal history of antineoplastic chemotherapy: Secondary | ICD-10-CM

## 2019-09-02 DIAGNOSIS — Z7982 Long term (current) use of aspirin: Secondary | ICD-10-CM

## 2019-09-02 DIAGNOSIS — N390 Urinary tract infection, site not specified: Secondary | ICD-10-CM | POA: Diagnosis not present

## 2019-09-02 DIAGNOSIS — I110 Hypertensive heart disease with heart failure: Secondary | ICD-10-CM | POA: Diagnosis present

## 2019-09-02 DIAGNOSIS — E872 Acidosis, unspecified: Secondary | ICD-10-CM | POA: Diagnosis present

## 2019-09-02 DIAGNOSIS — Z9011 Acquired absence of right breast and nipple: Secondary | ICD-10-CM

## 2019-09-02 DIAGNOSIS — N179 Acute kidney failure, unspecified: Secondary | ICD-10-CM | POA: Diagnosis present

## 2019-09-02 DIAGNOSIS — U071 COVID-19: Secondary | ICD-10-CM

## 2019-09-02 DIAGNOSIS — E11649 Type 2 diabetes mellitus with hypoglycemia without coma: Secondary | ICD-10-CM | POA: Diagnosis present

## 2019-09-02 DIAGNOSIS — C50911 Malignant neoplasm of unspecified site of right female breast: Secondary | ICD-10-CM | POA: Diagnosis present

## 2019-09-02 DIAGNOSIS — Z79899 Other long term (current) drug therapy: Secondary | ICD-10-CM

## 2019-09-02 DIAGNOSIS — I5032 Chronic diastolic (congestive) heart failure: Secondary | ICD-10-CM | POA: Diagnosis present

## 2019-09-02 DIAGNOSIS — R6520 Severe sepsis without septic shock: Secondary | ICD-10-CM | POA: Diagnosis present

## 2019-09-02 DIAGNOSIS — R112 Nausea with vomiting, unspecified: Secondary | ICD-10-CM | POA: Diagnosis not present

## 2019-09-02 DIAGNOSIS — E861 Hypovolemia: Secondary | ICD-10-CM | POA: Diagnosis present

## 2019-09-02 DIAGNOSIS — Z90711 Acquired absence of uterus with remaining cervical stump: Secondary | ICD-10-CM | POA: Diagnosis not present

## 2019-09-02 DIAGNOSIS — G4733 Obstructive sleep apnea (adult) (pediatric): Secondary | ICD-10-CM | POA: Diagnosis present

## 2019-09-02 DIAGNOSIS — E1165 Type 2 diabetes mellitus with hyperglycemia: Secondary | ICD-10-CM | POA: Diagnosis not present

## 2019-09-02 DIAGNOSIS — D6489 Other specified anemias: Secondary | ICD-10-CM | POA: Diagnosis present

## 2019-09-02 DIAGNOSIS — E039 Hypothyroidism, unspecified: Secondary | ICD-10-CM | POA: Diagnosis present

## 2019-09-02 DIAGNOSIS — Z79890 Hormone replacement therapy (postmenopausal): Secondary | ICD-10-CM | POA: Diagnosis not present

## 2019-09-02 DIAGNOSIS — A4189 Other specified sepsis: Principal | ICD-10-CM | POA: Diagnosis present

## 2019-09-02 DIAGNOSIS — R911 Solitary pulmonary nodule: Secondary | ICD-10-CM | POA: Diagnosis present

## 2019-09-02 DIAGNOSIS — Z853 Personal history of malignant neoplasm of breast: Secondary | ICD-10-CM

## 2019-09-02 DIAGNOSIS — Z923 Personal history of irradiation: Secondary | ICD-10-CM | POA: Diagnosis not present

## 2019-09-02 DIAGNOSIS — R197 Diarrhea, unspecified: Secondary | ICD-10-CM | POA: Diagnosis not present

## 2019-09-02 DIAGNOSIS — E119 Type 2 diabetes mellitus without complications: Secondary | ICD-10-CM | POA: Diagnosis not present

## 2019-09-02 DIAGNOSIS — A419 Sepsis, unspecified organism: Secondary | ICD-10-CM | POA: Diagnosis present

## 2019-09-02 DIAGNOSIS — E875 Hyperkalemia: Secondary | ICD-10-CM | POA: Diagnosis present

## 2019-09-02 DIAGNOSIS — Z992 Dependence on renal dialysis: Secondary | ICD-10-CM

## 2019-09-02 DIAGNOSIS — N170 Acute kidney failure with tubular necrosis: Secondary | ICD-10-CM | POA: Diagnosis present

## 2019-09-02 DIAGNOSIS — C50011 Malignant neoplasm of nipple and areola, right female breast: Secondary | ICD-10-CM | POA: Diagnosis not present

## 2019-09-02 DIAGNOSIS — E669 Obesity, unspecified: Secondary | ICD-10-CM | POA: Diagnosis present

## 2019-09-02 DIAGNOSIS — Z6838 Body mass index (BMI) 38.0-38.9, adult: Secondary | ICD-10-CM

## 2019-09-02 DIAGNOSIS — N136 Pyonephrosis: Secondary | ICD-10-CM | POA: Diagnosis present

## 2019-09-02 LAB — URINALYSIS, ROUTINE W REFLEX MICROSCOPIC
Bilirubin Urine: NEGATIVE
Glucose, UA: 50 mg/dL — AB
Ketones, ur: NEGATIVE mg/dL
Nitrite: NEGATIVE
Protein, ur: 100 mg/dL — AB
RBC / HPF: >50 RBC/hpf — ABNORMAL HIGH (ref 0–5)
Specific Gravity, Urine: 1.016 (ref 1.005–1.030)
Squamous Epithelial / HPF: >50 — ABNORMAL HIGH (ref 0–5)
WBC, UA: >50 WBC/hpf — ABNORMAL HIGH (ref 0–5)
pH: 5 (ref 5.0–8.0)

## 2019-09-02 LAB — CBC
HCT: 35.9 % — ABNORMAL LOW (ref 36.0–46.0)
Hemoglobin: 11.4 g/dL — ABNORMAL LOW (ref 12.0–15.0)
MCH: 29 pg (ref 26.0–34.0)
MCHC: 31.8 g/dL (ref 30.0–36.0)
MCV: 91.3 fL (ref 80.0–100.0)
Platelets: 253 10*3/uL (ref 150–400)
RBC: 3.93 MIL/uL (ref 3.87–5.11)
RDW: 14.3 % (ref 11.5–15.5)
WBC: 8.2 10*3/uL (ref 4.0–10.5)
nRBC: 0 % (ref 0.0–0.2)

## 2019-09-02 LAB — COMPREHENSIVE METABOLIC PANEL
ALT: 16 U/L (ref 0–44)
AST: 22 U/L (ref 15–41)
Albumin: 3.6 g/dL (ref 3.5–5.0)
Alkaline Phosphatase: 73 U/L (ref 38–126)
Anion gap: 21 — ABNORMAL HIGH (ref 5–15)
BUN: 67 mg/dL — ABNORMAL HIGH (ref 8–23)
CO2: 18 mmol/L — ABNORMAL LOW (ref 22–32)
Calcium: 9.3 mg/dL (ref 8.9–10.3)
Chloride: 97 mmol/L — ABNORMAL LOW (ref 98–111)
Creatinine, Ser: 6.8 mg/dL — ABNORMAL HIGH (ref 0.44–1.00)
GFR calc Af Amer: 7 mL/min — ABNORMAL LOW (ref >60)
GFR calc non Af Amer: 6 mL/min — ABNORMAL LOW (ref >60)
Glucose, Bld: 97 mg/dL (ref 70–99)
Potassium: 5.3 mmol/L — ABNORMAL HIGH (ref 3.5–5.1)
Sodium: 136 mmol/L (ref 135–145)
Total Bilirubin: 0.3 mg/dL (ref 0.3–1.2)
Total Protein: 7.8 g/dL (ref 6.5–8.1)

## 2019-09-02 LAB — PROTIME-INR
INR: 1.2 (ref 0.8–1.2)
Prothrombin Time: 15.5 seconds — ABNORMAL HIGH (ref 11.4–15.2)

## 2019-09-02 LAB — LACTIC ACID, PLASMA
Lactic Acid, Venous: 6.6 mmol/L (ref 0.5–1.9)
Lactic Acid, Venous: 8 mmol/L (ref 0.5–1.9)

## 2019-09-02 LAB — APTT: aPTT: 34 seconds (ref 24–36)

## 2019-09-02 LAB — LIPASE, BLOOD: Lipase: 79 U/L — ABNORMAL HIGH (ref 11–51)

## 2019-09-02 MED ORDER — SODIUM CHLORIDE 0.9 % IV SOLN
1.0000 g | Freq: Once | INTRAVENOUS | Status: AC
  Administered 2019-09-02: 1 g via INTRAVENOUS
  Filled 2019-09-02: qty 10

## 2019-09-02 MED ORDER — LACTATED RINGERS IV BOLUS (SEPSIS)
2000.0000 mL | Freq: Once | INTRAVENOUS | Status: AC
  Administered 2019-09-02: 2000 mL via INTRAVENOUS

## 2019-09-02 MED ORDER — SODIUM CHLORIDE 0.9% FLUSH
3.0000 mL | Freq: Once | INTRAVENOUS | Status: AC
  Administered 2019-09-02: 21:00:00 3 mL via INTRAVENOUS

## 2019-09-02 MED ORDER — LACTATED RINGERS IV BOLUS
1000.0000 mL | Freq: Once | INTRAVENOUS | Status: AC
  Administered 2019-09-02: 1000 mL via INTRAVENOUS

## 2019-09-02 NOTE — ED Triage Notes (Signed)
Patient states she was a prescribed Cipro for a UTI x 4 days ago. Patient c/o emesis and diarrhea since this AM.

## 2019-09-02 NOTE — ED Notes (Signed)
Date and time results received: 09/02/19 2203 (use smartphrase ".now" to insert current time)  Test: LacticAcid Critical Value: 6.6  Name of Provider Notified: Alvino Chapel, MD  Orders Received? Or Actions Taken?: Orders Received - See Orders for details

## 2019-09-02 NOTE — ED Notes (Signed)
Unable to obtain 2nd Blood Culture. ABX started.

## 2019-09-02 NOTE — ED Provider Notes (Signed)
[facility] DEPT Provider Note   CSN: [id number] Arrival date & time: 09/02/19  1732     History Chief Complaint  Patient presents with  . Emesis  . Diarrhea    Charlene Perry is a 69 y.o. female.  HPI Patient presents with nausea vomiting and diarrhea.  Began this morning.  However around a week ago she developed dysuria.  Got seen in urgent care had urinalysis diagnosed with UTI and was treated with Cipro.  Has been feeling somewhat better but had nauseousness that now became vomiting and diarrhea.  Mild left flank pain.  States her dysuria that she was having prior to the antibiotics is cleared up however.  Seen in urgent care told she was pale and felt bad and came to the ER for further treatment.  No fevers.  No blood in the diarrhea.  Has not had fevers now.  Only mild abdominal pain    Past Medical History:  Diagnosis Date  . Arthritis   . Breast cancer in female [facility])    Right  . Cancer ([place])   . Diabetes mellitus without complication ([place])   . Family history of breast cancer   . Family history of colon cancer   . Hypertension    takes fluid pills  . Hypothyroidism   . Pink eye disease of left eye    went to eye md and received drops - yesterday.  . Sleep apnea    back in the 1990's  . Vitiligo     Patient Active Problem List   Diagnosis Date Noted  . Port-A-Cath in place 08/24/2018  . Breast cancer, right ([place]) 06/21/2018  . Genetic testing 01/14/2018  . Family history of colon cancer   . Family history of breast cancer   . Malignant neoplasm of upper-outer quadrant of right breast in female, estrogen receptor positive ([place]) 12/15/2017    Past Surgical History:  Procedure Laterality Date  . AXILLARY LYMPH NODE DISSECTION Right 07/13/2018   Procedure: RIGHT AXILLARY LYMPH NODE DISSECTION;  Surgeon: [name], MD;  Location: [place];  Service: General;  Laterality: Right;  . BREAST BIOPSY Bilateral 01/2019  .  BREAST SURGERY     left breast fibro adenoma  . CATARACT EXTRACTION Right 2019  . CERVICAL FUSION N/A    C5 &6  . CESAREAN SECTION    . CHOLECYSTECTOMY  2004-2005  . CYSTECTOMY     patient denies  . EYE SURGERY    . IR IMAGING GUIDED PORT INSERTION  07/29/2018  . MASTECTOMY Right 06/21/2018  . MASTECTOMY WITH RADIOACTIVE SEED GUIDED EXCISION AND AXILLARY SENTINEL LYMPH NODE BIOPSY Right 06/21/2018  . MASTECTOMY WITH RADIOACTIVE SEED GUIDED EXCISION AND AXILLARY SENTINEL LYMPH NODE BIOPSY Right 06/21/2018   Procedure: RIGHT TOTAL MASTECTOMY WITH RIGHT AXILLARY SENTINEL NODE BIOPSY AND RIGHT SEED GUIDED NODE EXCISION;  Surgeon: [name], MD;  Location: [place];  Service: General;  Laterality: Right;  . MYOMECTOMY  1986  . PARTIAL HYSTERECTOMY  1995  . PILONIDAL CYST EXCISION    . PORTACATH PLACEMENT N/A 07/13/2018   Procedure: INSERTION PORT-A-CATH WITH ULTRASOUND;  Surgeon: [name], MD;  Location: [place];  Service: General;  Laterality: N/A;  . TONSILLECTOMY  2002  . UVULOPALATOPHARYNGOPLASTY, TONSILLECTOMY AND SEPTOPLASTY       OB History   No obstetric history on file.     Family History  Problem Relation Age of Onset  . Colon cancer Mother 81  . Colon cancer Sister  46  . Colon cancer Brother        ex early 65's  . Breast cancer Other        40's    Social History   Tobacco Use  . Smoking status: Never Smoker  . Smokeless tobacco: Never Used  Substance Use Topics  . Alcohol use: Not Currently  . Drug use: Never    Home Medications Prior to Admission medications   Medication Sig Start Date End Date Taking? Authorizing Provider  aspirin EC 81 MG tablet Take 81 mg by mouth daily.   Yes [provider]  celecoxib (CELEBREX) 200 MG capsule Take 200 mg by mouth daily.   Yes [provider]  ciprofloxacin (CIPRO) 500 MG tablet Take 500 mg by mouth in the morning and at bedtime. For 7 days, starting 08/29/2019. 08/29/19  Yes [provider]  furosemide (LASIX) 40 MG tablet Take 80 mg by mouth daily.   Yes [provider]  gabapentin (NEURONTIN) 400 MG capsule Take 400 mg by mouth 2 (two) times daily.   Yes [provider]  letrozole (FEMARA) 2.5 MG tablet Take 1 tablet (2.5 mg total) by mouth daily. 03/07/19  Yes [name], MD  levothyroxine (SYNTHROID, LEVOTHROID) 125 MCG tablet Take 125 mcg by mouth daily before breakfast.   Yes [provider]  losartan (COZAAR) 25 MG tablet Take 25 mg by mouth every morning. 07/23/19  Yes [provider]  metFORMIN (GLUCOPHAGE) 1000 MG tablet Take 1,000 mg by mouth 2 (two) times daily with a meal.   Yes [provider]  potassium chloride (KLOR-CON) 10 MEQ tablet Take 10 mEq by mouth 2 (two) times daily. 06/17/19  Yes [provider]  rosuvastatin (CRESTOR) 40 MG tablet Take 40 mg by mouth at bedtime.    Yes [provider]  cholecalciferol (VITAMIN D3) 25 MCG (1000 UT) tablet Take 1 tablet (1,000 Units total) by mouth daily. 08/17/18   [name], MD  ondansetron (ZOFRAN) 8 MG tablet Take 1 tablet (8 mg total) by mouth 2 (two) times daily as needed. Start on the third day after chemotherapy. Patient not taking: Reported on 12/30/2018 06/29/18   [name], MD    Allergies    Patient has no known allergies.  Review of Systems   Review of Systems  Constitutional: Positive for appetite change and fatigue. Negative for fever.  HENT: Negative for congestion.   Respiratory: Negative for shortness of breath.   Cardiovascular: Negative for chest pain.  Gastrointestinal: Positive for diarrhea, nausea and vomiting. Negative for abdominal pain.  Genitourinary: Positive for flank pain.  Musculoskeletal: Negative for back pain.  Skin: Negative for rash.  Neurological: Negative for weakness.  Psychiatric/Behavioral: Negative for confusion.    Physical Exam Updated Vital Signs BP (!) 151/76   Pulse (!) 113   Temp  98.2 F (36.8 C) (Oral)   Resp 18   Ht 5\' 6"  (1.676 m)   Wt 97.1 kg   SpO2 98%   BMI 34.54 kg/m   Physical Exam Vitals and nursing note reviewed.  HENT:     Head: Normocephalic.  Eyes:     Pupils: Pupils are equal, round, and reactive to light.  Cardiovascular:     Rate and Rhythm: Normal rate and regular rhythm.  Pulmonary:     Breath sounds: No wheezing or rhonchi.  Abdominal:     Tenderness: There is no abdominal tenderness. There is no guarding.     Hernia: No  hernia is present.  Musculoskeletal:     Cervical back: Neck supple.     Right lower leg: No edema.     Left lower leg: No edema.  Skin:    General: Skin is warm.     Capillary Refill: Capillary refill takes less than 2 seconds.  Neurological:     Mental Status: She is alert and oriented to person, place, and time.     ED Results / Procedures / Treatments   Labs (all labs ordered are listed, but only abnormal results are displayed) Labs Reviewed  LIPASE, BLOOD - Abnormal; Notable for the following components:      Result Value   Lipase 79 (*)    All other components within normal limits  COMPREHENSIVE METABOLIC PANEL - Abnormal; Notable for the following components:   Potassium 5.3 (*)    Chloride 97 (*)    CO2 18 (*)    BUN 67 (*)    Creatinine, Ser 6.80 (*)    GFR calc non Af Amer 6 (*)    GFR calc Af Amer 7 (*)    Anion gap 21 (*)    All other components within normal limits  CBC - Abnormal; Notable for the following components:   Hemoglobin 11.4 (*)    HCT 35.9 (*)    All other components within normal limits  URINALYSIS, ROUTINE W REFLEX MICROSCOPIC - Abnormal; Notable for the following components:   Color, Urine AMBER (*)    APPearance TURBID (*)    Glucose, UA 50 (*)    Hgb urine dipstick LARGE (*)    Protein, ur 100 (*)    Leukocytes,Ua LARGE (*)    RBC / HPF >50 (*)    WBC, UA >50 (*)    Bacteria, UA MANY (*)    Squamous Epithelial / LPF >50 (*)    Non Squamous Epithelial 0-5 (*)     All other components within normal limits  LACTIC ACID, PLASMA - Abnormal; Notable for the following components:   Lactic Acid, Venous 6.6 (*)    All other components within normal limits  CULTURE, BLOOD (ROUTINE X 2)  CULTURE, BLOOD (ROUTINE X 2)  URINE CULTURE  LACTIC ACID, PLASMA  APTT  PROTIME-INR    EKG None  Radiology CT Renal Stone Study  Result Date: 09/02/2019 CLINICAL DATA:  Flank pain. EXAM: CT ABDOMEN AND PELVIS WITHOUT CONTRAST TECHNIQUE: Multidetector CT imaging of the abdomen and pelvis was performed following the standard protocol without IV contrast. COMPARISON:  March 15, 2018 FINDINGS: Lower chest: A focal 9 mm solid pleural based nodularity in the right middle lobe on axial series 2, image 7 new since September 2019 with adjacent streaky consolidation in the right middle lobe, possibly aspiration related. Follow-up imaging is indicated to exclude an underlying metastatic nodule given postoperative changes from right mastectomy. Normal heart size. Mild mitral valvular calcification. Hepatobiliary: Cholecystectomy. Mild hepatomegaly. Benign hepatic cysts, similarly sized; no further imaging characterization recommended. Chronic 10 mm dilatation of the proximal common bile duct with normal distal tapering and no intrahepatic biliary duct dilatation. Pancreas: Normal. Spleen: Normal. Adrenals/Urinary Tract: Stable 1.8 cm benign adrenal adenoma measuring 10 Hounsfield units. Normal right adrenal gland. Bilateral moderate perinephric inflammatory changes, slightly increased. Nonobstructing right renal punctate calculus. Mild left hydronephrosis without an obstructing renal or ureteral calculus. Decompressed bilateral ureters. Diffusely thickened urinary bladder with perivesicular stranding. Stomach/Bowel: Mild nonspecific bowel wall thickening of the duodenum and left upper abdominal quadrant jejunum without perforation or  free fluid. Mild adjacent inflammatory change. Reactive  appearing lymph nodes in the associated mesenteric root. Nonobstructed. No colonic or gastric mucosal thickening. Small hiatal hernia. Normal appendix, axial series 2, image 67. Mild diverticulosis coli in the sigmoid and proximal transverse colon. Vascular/Lymphatic: Aortic calcified atherosclerosis. No abdominal aortic aneurysm. No adenopathy. Reproductive: Atrophic or surgically absent uterus. No adnexal cyst or mass. Other: Midline infraumbilical hernia containing non-strangulated, non incarcerated and nonobstructed small bowel loops. Small uncomplicated periumbilical herniation of fat. Musculoskeletal: Moderate degenerative change. Probable benign bone islands in the left hip. A sternal foramen. I discussed critical results and additional imaging recommendations by telephone at the time of interpretation on 09/02/2019 at 9:30 p.m. with provider Davonna Belling , who verbally acknowledged these results. IMPRESSION: Mild left hydronephrosis and diffuse circumferential wall thickening of the urinary bladder with mild perivesicular stranding. Findings support a clinical diagnosis of an acute cystitis with upper urinary tract infection. Bilateral moderate perinephric stranding could represent infection or medical renal disease. No obstructing renal or ureteral calculus. Punctate nonobstructing right renal calculus. A 9 mm solid pleural based right middle lobe pulmonary nodularity with adjacent streaky consolidation, which could be aspiration-related. Outpatient unenhanced CT chest recommended with surveillance CT Chest in 3 months should this nodularity persist. A metastatic or malignant pulmonary nodule are differential considerations, particularly given the history of right mastectomy. Mild enteritis, consistent with the patient history of nausea and vomiting. Hepatomegaly and stable benign hepatic cysts for which no further imaging characterization or surveillance is recommended. Uncomplicated infraumbilical  abdominal wall herniation of small bowel loops without strangulation, incarceration or obstruction. Stable size of a small left adrenal benign adenoma. Electronically Signed   By: Revonda Humphrey   On: 09/02/2019 21:31    Procedures Procedures (including critical care time)  Medications Ordered in ED Medications  lactated ringers bolus 2,000 mL (has no administration in time range)  sodium chloride flush (NS) 0.9 % injection 3 mL (3 mLs Intravenous Given 09/02/19 2058)  lactated ringers bolus 1,000 mL (1,000 mLs Intravenous New Bag/Given 09/02/19 2152)  cefTRIAXone (ROCEPHIN) 1 g in sodium chloride 0.9 % 100 mL IVPB (0 g Intravenous Stopped 09/02/19 2153)    ED Course  I have reviewed the triage vital signs and the nursing notes.  Pertinent labs & imaging results that were available during my care of the patient were reviewed by me and considered in my medical decision making (see chart for details).    MDM Rules/Calculators/A&P                      Patient presents with left flank pain nausea vomiting diarrhea.  Around 5 days ago diagnosed with UTI and been on Cipro.  Urinary symptoms improved but then developed nausea vomiting diarrhea.  Has new acute kidney injury with creatinine of 6.  Normal white count and afebrile here.  Urine shows likely infection.  CT scan done due to pain and renal failure and look for stone.  CT scan shows likely pyelonephritis without stone.  Has not been hypotensive.  Initial ceftriaxone given along with a liter fluid bolus, however lactate came back after delay and was elevated.  At the point of the elevation of the lactate code sepsis was called.  Although the elevated lactate could be due to dehydration as opposed to the infection.  Kidney injury also could come from dehydration due to the nausea vomiting diarrhea.  However will give fluid boluses.  Admit to hospitalist. CT scan also showed  potential nodule on lung.  Informed patient of this and will need  outpatient follow-up.  CRITICAL CARE Performed by: Davonna Belling Total critical care time: 30 minutes Critical care time was exclusive of separately billable procedures and treating other patients. Critical care was necessary to treat or prevent imminent or life-threatening deterioration. Critical care was time spent personally by me on the following activities: development of treatment plan with patient and/or surrogate as well as nursing, discussions with consultants, evaluation of patient's response to treatment, examination of patient, obtaining history from patient or surrogate, ordering and performing treatments and interventions, ordering and review of laboratory studies, ordering and review of radiographic studies, pulse oximetry and re-evaluation of patient's condition.  Final Clinical Impression(s) / ED Diagnoses Final diagnoses:  Acute pyelonephritis  AKI (acute kidney injury) ([place])  Nausea vomiting and diarrhea    Rx / DC Orders ED Discharge Orders    None       Davonna Belling, MD 09/02/19 2211

## 2019-09-02 NOTE — ED Notes (Signed)
Pt transported to CT ?

## 2019-09-03 ENCOUNTER — Inpatient Hospital Stay (HOSPITAL_COMMUNITY): Payer: BC Managed Care – PPO

## 2019-09-03 ENCOUNTER — Encounter (HOSPITAL_COMMUNITY): Payer: Self-pay | Admitting: Family Medicine

## 2019-09-03 DIAGNOSIS — N179 Acute kidney failure, unspecified: Secondary | ICD-10-CM

## 2019-09-03 DIAGNOSIS — N390 Urinary tract infection, site not specified: Secondary | ICD-10-CM | POA: Diagnosis present

## 2019-09-03 DIAGNOSIS — E119 Type 2 diabetes mellitus without complications: Secondary | ICD-10-CM

## 2019-09-03 DIAGNOSIS — E872 Acidosis, unspecified: Secondary | ICD-10-CM | POA: Diagnosis present

## 2019-09-03 DIAGNOSIS — N10 Acute tubulo-interstitial nephritis: Secondary | ICD-10-CM | POA: Insufficient documentation

## 2019-09-03 DIAGNOSIS — A419 Sepsis, unspecified organism: Secondary | ICD-10-CM

## 2019-09-03 DIAGNOSIS — C50011 Malignant neoplasm of nipple and areola, right female breast: Secondary | ICD-10-CM

## 2019-09-03 LAB — COMPREHENSIVE METABOLIC PANEL
ALT: 18 U/L (ref 0–44)
AST: 25 U/L (ref 15–41)
Albumin: 2.7 g/dL — ABNORMAL LOW (ref 3.5–5.0)
Alkaline Phosphatase: 66 U/L (ref 38–126)
Anion gap: 21 — ABNORMAL HIGH (ref 5–15)
BUN: 68 mg/dL — ABNORMAL HIGH (ref 8–23)
CO2: 17 mmol/L — ABNORMAL LOW (ref 22–32)
Calcium: 8.4 mg/dL — ABNORMAL LOW (ref 8.9–10.3)
Chloride: 98 mmol/L (ref 98–111)
Creatinine, Ser: 7.13 mg/dL — ABNORMAL HIGH (ref 0.44–1.00)
GFR calc Af Amer: 6 mL/min — ABNORMAL LOW (ref >60)
GFR calc non Af Amer: 5 mL/min — ABNORMAL LOW (ref >60)
Glucose, Bld: 71 mg/dL (ref 70–99)
Potassium: 5.7 mmol/L — ABNORMAL HIGH (ref 3.5–5.1)
Sodium: 136 mmol/L (ref 135–145)
Total Bilirubin: 0.6 mg/dL (ref 0.3–1.2)
Total Protein: 6 g/dL — ABNORMAL LOW (ref 6.5–8.1)

## 2019-09-03 LAB — HEMOGLOBIN A1C
Hgb A1c MFr Bld: 7.4 % — ABNORMAL HIGH (ref 4.8–5.6)
Mean Plasma Glucose: 165.68 mg/dL

## 2019-09-03 LAB — BASIC METABOLIC PANEL
Anion gap: 23 — ABNORMAL HIGH (ref 5–15)
BUN: 78 mg/dL — ABNORMAL HIGH (ref 8–23)
CO2: 17 mmol/L — ABNORMAL LOW (ref 22–32)
Calcium: 8.3 mg/dL — ABNORMAL LOW (ref 8.9–10.3)
Chloride: 96 mmol/L — ABNORMAL LOW (ref 98–111)
Creatinine, Ser: 7.73 mg/dL — ABNORMAL HIGH (ref 0.44–1.00)
GFR calc Af Amer: 6 mL/min — ABNORMAL LOW (ref >60)
GFR calc non Af Amer: 5 mL/min — ABNORMAL LOW (ref >60)
Glucose, Bld: 76 mg/dL (ref 70–99)
Potassium: 5.3 mmol/L — ABNORMAL HIGH (ref 3.5–5.1)
Sodium: 136 mmol/L (ref 135–145)

## 2019-09-03 LAB — RENAL FUNCTION PANEL
Albumin: 3.3 g/dL — ABNORMAL LOW (ref 3.5–5.0)
Anion gap: 22 — ABNORMAL HIGH (ref 5–15)
BUN: 83 mg/dL — ABNORMAL HIGH (ref 8–23)
CO2: 19 mmol/L — ABNORMAL LOW (ref 22–32)
Calcium: 8.2 mg/dL — ABNORMAL LOW (ref 8.9–10.3)
Chloride: 97 mmol/L — ABNORMAL LOW (ref 98–111)
Creatinine, Ser: 7.69 mg/dL — ABNORMAL HIGH (ref 0.44–1.00)
GFR calc Af Amer: 6 mL/min — ABNORMAL LOW (ref >60)
GFR calc non Af Amer: 5 mL/min — ABNORMAL LOW (ref >60)
Glucose, Bld: 76 mg/dL (ref 70–99)
Phosphorus: 8.4 mg/dL — ABNORMAL HIGH (ref 2.5–4.6)
Potassium: 5.2 mmol/L — ABNORMAL HIGH (ref 3.5–5.1)
Sodium: 138 mmol/L (ref 135–145)

## 2019-09-03 LAB — CBC WITH DIFFERENTIAL/PLATELET
Abs Immature Granulocytes: 0.07 10*3/uL (ref 0.00–0.07)
Basophils Absolute: 0 10*3/uL (ref 0.0–0.1)
Basophils Relative: 0 %
Eosinophils Absolute: 0 10*3/uL (ref 0.0–0.5)
Eosinophils Relative: 0 %
HCT: 32.4 % — ABNORMAL LOW (ref 36.0–46.0)
Hemoglobin: 10.2 g/dL — ABNORMAL LOW (ref 12.0–15.0)
Immature Granulocytes: 1 %
Lymphocytes Relative: 9 %
Lymphs Abs: 0.8 10*3/uL (ref 0.7–4.0)
MCH: 28.7 pg (ref 26.0–34.0)
MCHC: 31.5 g/dL (ref 30.0–36.0)
MCV: 91.3 fL (ref 80.0–100.0)
Monocytes Absolute: 1 10*3/uL (ref 0.1–1.0)
Monocytes Relative: 11 %
Neutro Abs: 7 10*3/uL (ref 1.7–7.7)
Neutrophils Relative %: 79 %
Platelets: 241 10*3/uL (ref 150–400)
RBC: 3.55 MIL/uL — ABNORMAL LOW (ref 3.87–5.11)
RDW: 14.2 % (ref 11.5–15.5)
WBC: 9 10*3/uL (ref 4.0–10.5)
nRBC: 0 % (ref 0.0–0.2)

## 2019-09-03 LAB — MRSA PCR SCREENING: MRSA by PCR: NEGATIVE

## 2019-09-03 LAB — LACTIC ACID, PLASMA
Lactic Acid, Venous: 7.6 mmol/L (ref 0.5–1.9)
Lactic Acid, Venous: 7.4 mmol/L (ref 0.5–1.9)
Lactic Acid, Venous: 6.3 mmol/L (ref 0.5–1.9)

## 2019-09-03 LAB — GLUCOSE, CAPILLARY
Glucose-Capillary: 66 mg/dL — ABNORMAL LOW (ref 70–99)
Glucose-Capillary: 73 mg/dL (ref 70–99)
Glucose-Capillary: 129 mg/dL — ABNORMAL HIGH (ref 70–99)

## 2019-09-03 LAB — CREATININE, URINE, RANDOM: Creatinine, Urine: 60.31 mg/dL

## 2019-09-03 LAB — SARS CORONAVIRUS 2 (TAT 6-24 HRS): SARS Coronavirus 2: POSITIVE — AB

## 2019-09-03 LAB — CBG MONITORING, ED
Glucose-Capillary: 59 mg/dL — ABNORMAL LOW (ref 70–99)
Glucose-Capillary: 100 mg/dL — ABNORMAL HIGH (ref 70–99)
Glucose-Capillary: 69 mg/dL — ABNORMAL LOW (ref 70–99)
Glucose-Capillary: 73 mg/dL (ref 70–99)

## 2019-09-03 LAB — SODIUM, URINE, RANDOM: Sodium, Ur: 89 mmol/L

## 2019-09-03 LAB — HIV ANTIBODY (ROUTINE TESTING W REFLEX): HIV Screen 4th Generation wRfx: NONREACTIVE

## 2019-09-03 MED ORDER — PRISMASOL BGK 4/2.5 32-4-2.5 MEQ/L REPLACEMENT SOLN
Status: DC
  Filled 2019-09-03 (×2): qty 5000

## 2019-09-03 MED ORDER — ALTEPLASE 2 MG IJ SOLR: 2.0000 mg | Freq: Once | INTRAMUSCULAR | Status: DC | PRN

## 2019-09-03 MED ORDER — SODIUM CHLORIDE 0.9% FLUSH
3.0000 mL | Freq: Two times a day (BID) | INTRAVENOUS | Status: DC
  Administered 2019-09-03 – 2019-09-09 (×12): 3 mL via INTRAVENOUS

## 2019-09-03 MED ORDER — STERILE WATER FOR INJECTION IV SOLN
INTRAVENOUS | Status: AC
  Filled 2019-09-03: qty 850

## 2019-09-03 MED ORDER — ONDANSETRON HCL 4 MG/2ML IJ SOLN
4.0000 mg | Freq: Four times a day (QID) | INTRAMUSCULAR | Status: DC | PRN
  Administered 2019-09-03 – 2019-09-08 (×5): 4 mg via INTRAVENOUS
  Filled 2019-09-03 (×6): qty 2

## 2019-09-03 MED ORDER — SODIUM CHLORIDE 0.9 % IV SOLN: INTRAVENOUS | Status: DC

## 2019-09-03 MED ORDER — ALBUMIN HUMAN 5 % IV SOLN
25.0000 g | Freq: Once | INTRAVENOUS | Status: AC
  Administered 2019-09-03: 25 g via INTRAVENOUS
  Filled 2019-09-03: qty 500

## 2019-09-03 MED ORDER — SODIUM CHLORIDE 0.9 % IV SOLN
1.0000 g | INTRAVENOUS | Status: AC
  Administered 2019-09-03 – 2019-09-08 (×6): 1 g via INTRAVENOUS
  Filled 2019-09-03 (×3): qty 10
  Filled 2019-09-03: qty 1
  Filled 2019-09-03 (×3): qty 10

## 2019-09-03 MED ORDER — ACETAMINOPHEN 325 MG PO TABS
650.0000 mg | ORAL_TABLET | Freq: Four times a day (QID) | ORAL | Status: DC | PRN
  Administered 2019-09-06 – 2019-09-07 (×2): 650 mg via ORAL
  Filled 2019-09-03 (×2): qty 2

## 2019-09-03 MED ORDER — INSULIN ASPART 100 UNIT/ML ~~LOC~~ SOLN
0.0000 [IU] | Freq: Three times a day (TID) | SUBCUTANEOUS | Status: DC
  Administered 2019-09-04: 12:00:00 1 [IU] via SUBCUTANEOUS
  Filled 2019-09-03: qty 0.06

## 2019-09-03 MED ORDER — LACTATED RINGERS IV BOLUS
1000.0000 mL | Freq: Once | INTRAVENOUS | Status: AC
  Administered 2019-09-03: 1000 mL via INTRAVENOUS

## 2019-09-03 MED ORDER — HEPARIN SODIUM (PORCINE) 1000 UNIT/ML DIALYSIS: 1000.0000 [IU] | INTRAMUSCULAR | Status: DC | PRN

## 2019-09-03 MED ORDER — LEVOTHYROXINE SODIUM 25 MCG PO TABS
125.0000 ug | ORAL_TABLET | Freq: Every day | ORAL | Status: DC
  Administered 2019-09-03 – 2019-09-09 (×7): 125 ug via ORAL
  Filled 2019-09-03 (×7): qty 1

## 2019-09-03 MED ORDER — SODIUM CHLORIDE 0.9 % FOR CRRT
INTRAVENOUS_CENTRAL | Status: DC | PRN
  Filled 2019-09-03 (×2): qty 1000

## 2019-09-03 MED ORDER — PRISMASOL BGK 4/2.5 32-4-2.5 MEQ/L IV SOLN
INTRAVENOUS | Status: DC
  Filled 2019-09-03 (×6): qty 5000

## 2019-09-03 MED ORDER — HEPARIN BOLUS VIA INFUSION (CRRT)
1000.0000 [IU] | INTRAVENOUS | Status: DC | PRN
  Filled 2019-09-03: qty 1000

## 2019-09-03 MED ORDER — HEPARIN SODIUM (PORCINE) 1000 UNIT/ML DIALYSIS
1000.0000 [IU] | INTRAMUSCULAR | Status: DC | PRN
  Administered 2019-09-05: 3200 [IU] via INTRAVENOUS_CENTRAL
  Filled 2019-09-03: qty 3
  Filled 2019-09-03 (×2): qty 6

## 2019-09-03 MED ORDER — SODIUM ZIRCONIUM CYCLOSILICATE 10 G PO PACK
10.0000 g | PACK | Freq: Two times a day (BID) | ORAL | Status: DC
  Administered 2019-09-03 – 2019-09-04 (×3): 10 g via ORAL
  Filled 2019-09-03 (×5): qty 1

## 2019-09-03 MED ORDER — CHLORHEXIDINE GLUCONATE CLOTH 2 % EX PADS
6.0000 | MEDICATED_PAD | Freq: Every day | CUTANEOUS | Status: DC
  Administered 2019-09-03 – 2019-09-09 (×7): 6 via TOPICAL

## 2019-09-03 MED ORDER — LETROZOLE 2.5 MG PO TABS
2.5000 mg | ORAL_TABLET | Freq: Every day | ORAL | Status: DC
  Administered 2019-09-03 – 2019-09-09 (×7): 2.5 mg via ORAL
  Filled 2019-09-03 (×7): qty 1

## 2019-09-03 MED ORDER — PRISMASOL BGK 4/2.5 32-4-2.5 MEQ/L REPLACEMENT SOLN
Status: DC
  Filled 2019-09-03: qty 5000

## 2019-09-03 MED ORDER — HEPARIN SODIUM (PORCINE) 5000 UNIT/ML IJ SOLN
5000.0000 [IU] | Freq: Three times a day (TID) | INTRAMUSCULAR | Status: DC
  Administered 2019-09-03 – 2019-09-09 (×19): 5000 [IU] via SUBCUTANEOUS
  Filled 2019-09-03 (×19): qty 1

## 2019-09-03 MED ORDER — SODIUM CHLORIDE 0.9 % IV SOLN
250.0000 [IU]/h | INTRAVENOUS | Status: DC
  Filled 2019-09-03: qty 2

## 2019-09-03 NOTE — Progress Notes (Signed)
Hypoglycemic Event  CBG: 66  Treatment: Patient given juice and dinner.  Symptoms: Patient asymptomatic  Follow-up CBG: Time:1720 CBG Result:73  Possible Reasons for Event: Patient has decreased appetite due to Nausea.  Comments/MD notified:Dr. Bonner Puna updated.    Charlene Perry

## 2019-09-03 NOTE — Consult Note (Addendum)
NAME:  Charlene Perry, MRN:  [id number], DOB:  03/10/1951, LOS: 1 ADMISSION DATE:  09/02/2019, CONSULTATION DATE:  3/20 REFERRING MD:  Opyd, CHIEF COMPLAINT:  fatigue   Brief History   69 y/o female admitted on 3/20 from the [facility] ED with severe sepsis from a urinary tract infection.  Had [facility] in January 2021.  History of present illness   This is a pleasant 69 y/o female with a history of breast cancer who was treated for that in 2020 who came to the [facility] with N/V/D and fatigue on 3/19.  She developed dysuria and inability to empty her bladder around 3/15, then went to her PCP and was prescribed cipro which she took on 3/16.  She said that she had no relief of symptoms which at that time included back pain, fatigue, and anorexia.  Her symptoms worsened throughout the week and she developed worsening fatigue and then eventually nausea, vomiting, diarrhea on 3/19.  She did not eat much on 3/18 or 3/19.  She came to the ED where she was found to have severe AKI. She had COVID in January 2019 after exposed to her grandson who had it. She had no symptoms.  Past Medical History  DM2 Breast cancer Hypertension Hypothryroidism Sleep apnea, obstructive, on CPAP   Significant Hospital Events   3/20 admission  Consults:  PCCM  Procedures:    Significant Diagnostic Tests:  3/20 CT stone protocol abd/pelvis> 59mm RLL nodule, left perinephric stranding and swelling, some swelling of bladder  Micro Data:  3/19 SARS COV 2 > positive 3/19 blood culture >   Antimicrobials:  3/19 ceftriaxone >   Interim history/subjective:  As above  Objective   Blood pressure 139/75, pulse (!) 113, temperature 98.2 F (36.8 C), temperature source Oral, resp. rate (!) 22, height 5\' 6"  (1.676 m), weight 97.1 kg, SpO2 96 %.        Intake/Output Summary (Last 24 hours) at 09/03/2019 5784 Last data filed at 09/03/2019 0400 Gross per 24 hour  Intake 2981.35 ml  Output --  Net 2981.35 ml   Filed  Weights   09/02/19 1754  Weight: 97.1 kg    Examination: General:  Resting comfortably in bed HENT: NCAT OP clear PULM: CTA B, normal effort CV: RRR, no mgr, pulses intact, extremeties warm, not mottled GI: BS+, soft, nontender MSK: normal bulk and tone Neuro: awake, alert, no distress, MAEW   Resolved Hospital Problem list     Assessment & Plan:  AKI in setting of hypovolemia from severe sepsis and nausea, vomiting, diarrhea: Continue volume resuscitation with crystalloid and albumin as ordered Agree with bicarbonate infusion Nephrology consult, may need CVVHD today Place foley Monitor BMET and UOP Replace electrolytes as needed  Hyperkalemia: lokelma Repeat BMET May need CVVHD  Severe sepsis from UTI/Pyelonephritis: Continue ceftriaxone F/u blood cultures/urine cultures Currently volume replete based on exam, continue sodium bicarbonate Monitor lactic acid  OSA CPAP qHS  COVID 19 test positive, recovered: she had this in January, was asymptomatic, tested positive; currently outside her 21 day window, can come off airborne precautions  Rising/flat lactic acid in setting of volume resuscitation and currently not in shock, no abdominal pain: not clearing due to worsening renal failure? Monitor lactic acid Continue crystalloid infusion  Admit to ICU   Best practice:  Diet: advance as tolerated Pain/Anxiety/Delirium protocol (if indicated): n/a VAP protocol (if indicated): n/a DVT prophylaxis: sub q heparin GI prophylaxis: n/a Glucose control: per TRH Mobility: up ad lib Code  Status: full Family Communication: none bedside Disposition: admit to ICU  Labs   CBC: Recent Labs  Lab 09/02/19 1758 09/03/19 0430  WBC 8.2 9.0  NEUTROABS  --  7.0  HGB 11.4* 10.2*  HCT 35.9* 32.4*  MCV 91.3 91.3  PLT 253 062    Basic Metabolic Panel: Recent Labs  Lab 09/02/19 1758 09/03/19 0430  NA 136 136  K 5.3* 5.7*  CL 97* 98  CO2 18* 17*  GLUCOSE 97 71    BUN 67* 68*  CREATININE 6.80* 7.13*  CALCIUM 9.3 8.4*   GFR: Estimated Creatinine Clearance: 9.1 mL/min (A) (by C-G formula based on SCr of 7.13 mg/dL (H)). Recent Labs  Lab 09/02/19 1758 09/02/19 2055 09/02/19 2310 09/03/19 0130 09/03/19 0430  WBC 8.2  --   --   --  9.0  LATICACIDVEN  --  6.6* 8.0* 7.6* 7.4*    Liver Function Tests: Recent Labs  Lab 09/02/19 1758 09/03/19 0430  AST 22 25  ALT 16 18  ALKPHOS 73 66  BILITOT 0.3 0.6  PROT 7.8 6.0*  ALBUMIN 3.6 2.7*   Recent Labs  Lab 09/02/19 1758  LIPASE 79*   No results for input(s): AMMONIA in the last 168 hours.  ABG No results found for: PHART, PCO2ART, PO2ART, HCO3, TCO2, ACIDBASEDEF, O2SAT   Coagulation Profile: Recent Labs  Lab 09/02/19 2310  INR 1.2    Cardiac Enzymes: No results for input(s): CKTOTAL, CKMB, CKMBINDEX, TROPONINI in the last 168 hours.  HbA1C: Hgb A1c MFr Bld  Date/Time Value Ref Range Status  06/14/2018 03:25 PM 6.8 (H) 4.8 - 5.6 % Final    Comment:    (NOTE) Pre diabetes:          5.7%-6.4% Diabetes:              >6.4% Glycemic control for   <7.0% adults with diabetes     CBG: No results for input(s): GLUCAP in the last 168 hours.  Review of Systems:   Gen: Denies fever, chills, weight change, fatigue, night sweats HEENT: Denies blurred vision, double vision, hearing loss, tinnitus, sinus congestion, rhinorrhea, sore throat, neck stiffness, dysphagia PULM: Denies shortness of breath, cough, sputum production, hemoptysis, wheezing CV: Denies chest pain, edema, orthopnea, paroxysmal nocturnal dyspnea, palpitations GI: per HPI GU:per HPI Endocrine: Denies hot or cold intolerance, polyuria, polyphagia or appetite change Derm: Denies rash, dry skin, scaling or peeling skin change Heme: Denies easy bruising, bleeding, bleeding gums Neuro: Denies headache, numbness, weakness, slurred speech, loss of memory or consciousness   Past Medical History  She,  has a past  medical history of Arthritis, Breast cancer in female ([place]), Cancer ([place]), Diabetes mellitus without complication ([place]), Family history of breast cancer, Family history of colon cancer, Hypertension, Hypothyroidism, Pink eye disease of left eye, Sleep apnea, and Vitiligo.   Surgical History    Past Surgical History:  Procedure Laterality Date  . AXILLARY LYMPH NODE DISSECTION Right 07/13/2018   Procedure: RIGHT AXILLARY LYMPH NODE DISSECTION;  Surgeon: [name], MD;  Location: [place];  Service: General;  Laterality: Right;  . BREAST BIOPSY Bilateral 01/2019  . BREAST SURGERY     left breast fibro adenoma  . CATARACT EXTRACTION Right 2019  . CERVICAL FUSION N/A    C5 &6  . CESAREAN SECTION    . CHOLECYSTECTOMY  2004-2005  . CYSTECTOMY     patient denies  . EYE SURGERY    . IR IMAGING GUIDED PORT INSERTION  07/29/2018  .  MASTECTOMY Right 06/21/2018  . MASTECTOMY WITH RADIOACTIVE SEED GUIDED EXCISION AND AXILLARY SENTINEL LYMPH NODE BIOPSY Right 06/21/2018  . MASTECTOMY WITH RADIOACTIVE SEED GUIDED EXCISION AND AXILLARY SENTINEL LYMPH NODE BIOPSY Right 06/21/2018   Procedure: RIGHT TOTAL MASTECTOMY WITH RIGHT AXILLARY SENTINEL NODE BIOPSY AND RIGHT SEED GUIDED NODE EXCISION;  Surgeon: [name], MD;  Location: [place];  Service: General;  Laterality: Right;  . MYOMECTOMY  1986  . PARTIAL HYSTERECTOMY  1995  . PILONIDAL CYST EXCISION    . PORTACATH PLACEMENT N/A 07/13/2018   Procedure: INSERTION PORT-A-CATH WITH ULTRASOUND;  Surgeon: [name], MD;  Location: [place];  Service: General;  Laterality: N/A;  . TONSILLECTOMY  2002  . UVULOPALATOPHARYNGOPLASTY, TONSILLECTOMY AND SEPTOPLASTY       Social History   reports that she has never smoked. She has never used smokeless tobacco. She reports previous alcohol use. She reports that she does not use drugs.   Family History   Her family history includes Breast cancer in an other family member; Colon cancer in her  brother; Colon cancer (age of onset: 80) in her mother; Colon cancer (age of onset: 70) in her sister.   Allergies No Known Allergies   Home Medications  Prior to Admission medications   Medication Sig Start Date End Date Taking? Authorizing Provider  aspirin EC 81 MG tablet Take 81 mg by mouth daily.   Yes [provider]  celecoxib (CELEBREX) 200 MG capsule Take 200 mg by mouth daily.   Yes [provider]  ciprofloxacin (CIPRO) 500 MG tablet Take 500 mg by mouth in the morning and at bedtime. For 7 days, starting 08/29/2019. 08/29/19  Yes [provider]  furosemide (LASIX) 40 MG tablet Take 80 mg by mouth daily.   Yes [provider]  gabapentin (NEURONTIN) 400 MG capsule Take 400 mg by mouth 2 (two) times daily.   Yes [provider]  letrozole (FEMARA) 2.5 MG tablet Take 1 tablet (2.5 mg total) by mouth daily. 03/07/19  Yes [name], MD  levothyroxine (SYNTHROID, LEVOTHROID) 125 MCG tablet Take 125 mcg by mouth daily before breakfast.   Yes [provider]  losartan (COZAAR) 25 MG tablet Take 25 mg by mouth every morning. 07/23/19  Yes [provider]  metFORMIN (GLUCOPHAGE) 1000 MG tablet Take 1,000 mg by mouth 2 (two) times daily with a meal.   Yes [provider]  potassium chloride (KLOR-CON) 10 MEQ tablet Take 10 mEq by mouth 2 (two) times daily. 06/17/19  Yes [provider]  rosuvastatin (CRESTOR) 40 MG tablet Take 40 mg by mouth at bedtime.    Yes [provider]     Critical care time: 35 minutes    Roselie Awkward, MD [facility] P[facility]M Pager: [phone] Cell: [phone] If no response, call [phone]

## 2019-09-03 NOTE — H&P (Addendum)
History and Physical    Charlene Perry:[id number] DOB: 1950-08-08 DOA: 09/02/2019  PCP: [name], MD   Patient coming from: Home   Chief Complaint: Dysuria, N/V/D, fatigue   HPI: Charlene Perry is a 69 y.o. female with medical history significant for breast cancer status post surgery and chemotherapy, chronic diastolic CHF, hypothyroidism, hypertension, and type 2 diabetes mellitus, now presenting to the emergency department with several days of dysuria, loss of appetite, nausea, vomiting, diarrhea, and fatigue.  The patient reports that she was started on ciprofloxacin a few days ago, dysuria began to improve, but she continued to have fatigue, loss of appetite, nausea, nonbloody vomiting, and nonbloody loose stools.  She denies any chest pain or lightheadedness associated with this.  ED Course: Upon arrival to the ED, patient is found to be afebrile, saturating well on room air, tachycardic to 110, and with stable blood pressure.  EKG features sinus tachycardia with rate 113.  CBC notable for mild normocytic anemia.  Chemistry panel with potassium 5.3, bicarbonate 18, creatinine 6.80, up from 1.14 in July 2020.  Lactic acid was 6.6.  CT renal stone study with mild left hydronephrosis and diffuse circumferential wall thickening of the urinary bladder consistent with acute urinary tract infection, but no obstructing renal or ureteral calculus is noted.  Blood and urine cultures were collected and the patient was given a liter of lactated Ringer's and 1 g IV Rocephin.  Review of Systems:  All other systems reviewed and apart from HPI, are negative.  Past Medical History:  Diagnosis Date  . Arthritis   . Breast cancer in female [facility])    Right  . Cancer ([place])   . Diabetes mellitus without complication ([place])   . Family history of breast cancer   . Family history of colon cancer   . Hypertension    takes fluid pills  . Hypothyroidism   . Pink eye disease of  left eye    went to eye md and received drops - yesterday.  . Sleep apnea    back in the 1990's  . Vitiligo     Past Surgical History:  Procedure Laterality Date  . AXILLARY LYMPH NODE DISSECTION Right 07/13/2018   Procedure: RIGHT AXILLARY LYMPH NODE DISSECTION;  Surgeon: [name], MD;  Location: [place];  Service: General;  Laterality: Right;  . BREAST BIOPSY Bilateral 01/2019  . BREAST SURGERY     left breast fibro adenoma  . CATARACT EXTRACTION Right 2019  . CERVICAL FUSION N/A    C5 &6  . CESAREAN SECTION    . CHOLECYSTECTOMY  2004-2005  . CYSTECTOMY     patient denies  . EYE SURGERY    . IR IMAGING GUIDED PORT INSERTION  07/29/2018  . MASTECTOMY Right 06/21/2018  . MASTECTOMY WITH RADIOACTIVE SEED GUIDED EXCISION AND AXILLARY SENTINEL LYMPH NODE BIOPSY Right 06/21/2018  . MASTECTOMY WITH RADIOACTIVE SEED GUIDED EXCISION AND AXILLARY SENTINEL LYMPH NODE BIOPSY Right 06/21/2018   Procedure: RIGHT TOTAL MASTECTOMY WITH RIGHT AXILLARY SENTINEL NODE BIOPSY AND RIGHT SEED GUIDED NODE EXCISION;  Surgeon: [name], MD;  Location: [place];  Service: General;  Laterality: Right;  . MYOMECTOMY  1986  . PARTIAL HYSTERECTOMY  1995  . PILONIDAL CYST EXCISION    . PORTACATH PLACEMENT N/A 07/13/2018   Procedure: INSERTION PORT-A-CATH WITH ULTRASOUND;  Surgeon: [name], MD;  Location: [place];  Service: General;  Laterality: N/A;  . TONSILLECTOMY  2002  . UVULOPALATOPHARYNGOPLASTY, TONSILLECTOMY AND SEPTOPLASTY  reports that she has never smoked. She has never used smokeless tobacco. She reports previous alcohol use. She reports that she does not use drugs.  No Known Allergies  Family History  Problem Relation Age of Onset  . Colon cancer Mother 57  . Colon cancer Sister 75  . Colon cancer Brother        ex early 57's  . Breast cancer Other        40's     Prior to Admission medications   Medication Sig Start Date End Date Taking? Authorizing Provider   aspirin EC 81 MG tablet Take 81 mg by mouth daily.   Yes [provider]  celecoxib (CELEBREX) 200 MG capsule Take 200 mg by mouth daily.   Yes [provider]  ciprofloxacin (CIPRO) 500 MG tablet Take 500 mg by mouth in the morning and at bedtime. For 7 days, starting 08/29/2019. 08/29/19  Yes [provider]  furosemide (LASIX) 40 MG tablet Take 80 mg by mouth daily.   Yes [provider]  gabapentin (NEURONTIN) 400 MG capsule Take 400 mg by mouth 2 (two) times daily.   Yes [provider]  letrozole (FEMARA) 2.5 MG tablet Take 1 tablet (2.5 mg total) by mouth daily. 03/07/19  Yes [name], MD  levothyroxine (SYNTHROID, LEVOTHROID) 125 MCG tablet Take 125 mcg by mouth daily before breakfast.   Yes [provider]  losartan (COZAAR) 25 MG tablet Take 25 mg by mouth every morning. 07/23/19  Yes [provider]  metFORMIN (GLUCOPHAGE) 1000 MG tablet Take 1,000 mg by mouth 2 (two) times daily with a meal.   Yes [provider]  potassium chloride (KLOR-CON) 10 MEQ tablet Take 10 mEq by mouth 2 (two) times daily. 06/17/19  Yes [provider]  rosuvastatin (CRESTOR) 40 MG tablet Take 40 mg by mouth at bedtime.    Yes [provider]    Physical Exam: Vitals:   09/02/19 2300 09/02/19 2312 09/03/19 0000 09/03/19 0038  BP: (!) 161/81  (!) 147/60 (!) 147/74  Pulse: (!) 119  (!) 122 (!) 116  Resp: 15  18 16   Temp:  99.3 F (37.4 C)    TempSrc:  Oral    SpO2: 97%  98% 99%  Weight:      Height:        Constitutional: NAD, calm  Eyes: PERTLA, lids and conjunctivae normal ENMT: Mucous membranes are moist. Posterior pharynx clear of any exudate or lesions.   Neck: normal, supple, no masses, no thyromegaly Respiratory:  no wheezing, no crackles. No accessory muscle use.  Cardiovascular: Rate ~110 and regular. No extremity edema.   Abdomen: No distension, soft, tender on left without rebound pain or guarding.  Bowel sounds active.  Musculoskeletal: no clubbing / cyanosis. No joint deformity upper and lower extremities.   Skin: no significant rashes, lesions, ulcers. Warm, dry, well-perfused. Neurologic: No facial asymmetry. Sensation intact. Moving all extremities.  Psychiatric: Alert and oriented, appropriate throughtout interview and exam. Pleasant and cooperative.    Labs and Imaging on Admission: I have personally reviewed following labs and imaging studies  CBC: Recent Labs  Lab 09/02/19 1758  WBC 8.2  HGB 11.4*  HCT 35.9*  MCV 91.3  PLT 532   Basic Metabolic Panel: Recent Labs  Lab 09/02/19 1758  NA 136  K 5.3*  CL 97*  CO2 18*  GLUCOSE 97  BUN 67*  CREATININE 6.80*  CALCIUM 9.3   GFR: Estimated Creatinine Clearance:  9.6 mL/min (A) (by C-G formula based on SCr of 6.8 mg/dL (H)). Liver Function Tests: Recent Labs  Lab 09/02/19 1758  AST 22  ALT 16  ALKPHOS 73  BILITOT 0.3  PROT 7.8  ALBUMIN 3.6   Recent Labs  Lab 09/02/19 1758  LIPASE 79*   No results for input(s): AMMONIA in the last 168 hours. Coagulation Profile: Recent Labs  Lab 09/02/19 2310  INR 1.2   Cardiac Enzymes: No results for input(s): CKTOTAL, CKMB, CKMBINDEX, TROPONINI in the last 168 hours. BNP (last 3 results) No results for input(s): PROBNP in the last 8760 hours. HbA1C: No results for input(s): HGBA1C in the last 72 hours. CBG: No results for input(s): GLUCAP in the last 168 hours. Lipid Profile: No results for input(s): CHOL, HDL, LDLCALC, TRIG, CHOLHDL, LDLDIRECT in the last 72 hours. Thyroid Function Tests: No results for input(s): TSH, T4TOTAL, FREET4, T3FREE, THYROIDAB in the last 72 hours. Anemia Panel: No results for input(s): VITAMINB12, FOLATE, FERRITIN, TIBC, IRON, RETICCTPCT in the last 72 hours. Urine analysis:    Component Value Date/Time   COLORURINE AMBER (A) 09/02/2019 1918   APPEARANCEUR TURBID (A) 09/02/2019 1918   LABSPEC 1.016 09/02/2019 1918   PHURINE  5.0 09/02/2019 1918   GLUCOSEU 50 (A) 09/02/2019 1918   HGBUR LARGE (A) 09/02/2019 1918   BILIRUBINUR NEGATIVE 09/02/2019 1918   KETONESUR NEGATIVE 09/02/2019 1918   PROTEINUR 100 (A) 09/02/2019 1918   NITRITE NEGATIVE 09/02/2019 1918   LEUKOCYTESUR LARGE (A) 09/02/2019 1918   Sepsis Labs: @LABRCNTIP (procalcitonin:4,lacticidven:4) ) Recent Results (from the past 240 hour(s))  Culture, blood (routine x 2)     Status: None (Preliminary result)   Collection Time: 09/02/19  8:09 PM   Specimen: BLOOD  Result Value Ref Range Status   Specimen Description   Final    BLOOD LEFT ANTECUBITAL Performed at [facility], [place] [address], [place], [place] [address]    Special Requests   Final    BOTTLES DRAWN AEROBIC AND ANAEROBIC Blood Culture adequate volume Performed at [facility] [address], [place], [place] [address]    Culture PENDING  Incomplete   Report Status PENDING  Incomplete     Radiological Exams on Admission: CT Renal Stone Study  Result Date: 09/02/2019 CLINICAL DATA:  Flank pain. EXAM: CT ABDOMEN AND PELVIS WITHOUT CONTRAST TECHNIQUE: Multidetector CT imaging of the abdomen and pelvis was performed following the standard protocol without IV contrast. COMPARISON:  March 15, 2018 FINDINGS: Lower chest: A focal 9 mm solid pleural based nodularity in the right middle lobe on axial series 2, image 7 new since September 2019 with adjacent streaky consolidation in the right middle lobe, possibly aspiration related. Follow-up imaging is indicated to exclude an underlying metastatic nodule given postoperative changes from right mastectomy. Normal heart size. Mild mitral valvular calcification. Hepatobiliary: Cholecystectomy. Mild hepatomegaly. Benign hepatic cysts, similarly sized; no further imaging characterization recommended. Chronic 10 mm dilatation of the proximal common bile duct with normal distal tapering and no intrahepatic biliary duct  dilatation. Pancreas: Normal. Spleen: Normal. Adrenals/Urinary Tract: Stable 1.8 cm benign adrenal adenoma measuring 10 Hounsfield units. Normal right adrenal gland. Bilateral moderate perinephric inflammatory changes, slightly increased. Nonobstructing right renal punctate calculus. Mild left hydronephrosis without an obstructing renal or ureteral calculus. Decompressed bilateral ureters. Diffusely thickened urinary bladder with perivesicular stranding. Stomach/Bowel: Mild nonspecific bowel wall thickening of the duodenum and left upper abdominal quadrant jejunum without perforation or free fluid. Mild adjacent inflammatory change.  Reactive appearing lymph nodes in the associated mesenteric root. Nonobstructed. No colonic or gastric mucosal thickening. Small hiatal hernia. Normal appendix, axial series 2, image 67. Mild diverticulosis coli in the sigmoid and proximal transverse colon. Vascular/Lymphatic: Aortic calcified atherosclerosis. No abdominal aortic aneurysm. No adenopathy. Reproductive: Atrophic or surgically absent uterus. No adnexal cyst or mass. Other: Midline infraumbilical hernia containing non-strangulated, non incarcerated and nonobstructed small bowel loops. Small uncomplicated periumbilical herniation of fat. Musculoskeletal: Moderate degenerative change. Probable benign bone islands in the left hip. A sternal foramen. I discussed critical results and additional imaging recommendations by telephone at the time of interpretation on 09/02/2019 at 9:30 p.m. with provider Davonna Belling , who verbally acknowledged these results. IMPRESSION: Mild left hydronephrosis and diffuse circumferential wall thickening of the urinary bladder with mild perivesicular stranding. Findings support a clinical diagnosis of an acute cystitis with upper urinary tract infection. Bilateral moderate perinephric stranding could represent infection or medical renal disease. No obstructing renal or ureteral calculus. Punctate  nonobstructing right renal calculus. A 9 mm solid pleural based right middle lobe pulmonary nodularity with adjacent streaky consolidation, which could be aspiration-related. Outpatient unenhanced CT chest recommended with surveillance CT Chest in 3 months should this nodularity persist. A metastatic or malignant pulmonary nodule are differential considerations, particularly given the history of right mastectomy. Mild enteritis, consistent with the patient history of nausea and vomiting. Hepatomegaly and stable benign hepatic cysts for which no further imaging characterization or surveillance is recommended. Uncomplicated infraumbilical abdominal wall herniation of small bowel loops without strangulation, incarceration or obstruction. Stable size of a small left adrenal benign adenoma. Electronically Signed   By: Revonda Humphrey   On: 09/02/2019 21:31    EKG: Independently reviewed. Sinus tachycardia (rate 113).   Assessment/Plan   1. Severe sepsis secondary to UTI  - Presents with several days of fatigue, loss of appetite, N/V/D and dysuria that was improving with Cipro  - She is found to have lactic acidosis, AKI, tachycardia, and stable BP   - Blood and urine cultures collected in ED, 30 cc/kg LR bolus given, and she was started on Rocephin  - Continue IVF, continue antibiotics, trend lactate, follow cultures and clinical course   ADDENDUM: Lactate continues to be markedly elevated despite >4 liters IVF. Renal function worse this am too. BP remains stable and patient looks okay. Consulted with PCCM, appreciate recommendations, will give some albumin for now, follow-up on any additional recommendations.   2. Acute kidney injury  - SCr is 6.80 in ED, up from 1.14 in July 2020  - Likely prerenal azotemia in setting of loss of appetite, continued Lasix use, and N/V/D; losartan and ciprofloxacin could also be factors - No obstruction on CT in ED  - Check FENa, hold losartan and Lasix, continue  fluid-resuscitation, renally-dose medications, repeat chem panel in am   3. Type II DM  - Hold metformin, check CBGs and use low-intensity SSI as needed    4. Chronic diastolic CHF  - Hold Lasix, losartan, and potassium in light of ARF  - Follow I/O's    5. Hypothyroidism  - Continue Synthroid    6. Lung nodule  - Follow-up imaging recommended, pt aware    DVT prophylaxis: sq heparin  Code Status: Full  Family Communication: Discussed with patient  Disposition Plan: Likely back home in 3-4 days pending resolution of sepsis and completion of cultures/sensitivities  Consults called: None  Admission status: Inpatient     Vianne Bulls, MD [facility]  Pager: See [email or web address]  If 7AM-7PM, please contact the daytime attending [email or web address]  09/03/2019, 12:58 AM

## 2019-09-03 NOTE — Procedures (Signed)
Central Venous Catheter Insertion Procedure Note Charlene Perry [facility] [id number] 1951-04-29  Procedure: Insertion of Central Venous Catheter Indications: Acute Kidney Injury, need for hemodialysis  Procedure Details Consent: Risks of procedure as well as the alternatives and risks of each were explained to the (patient/caregiver).  Consent for procedure obtained. Time Out: Verified patient identification, verified procedure, site/side was marked, verified correct patient position, special equipment/implants available, medications/allergies/relevent history reviewed, required imaging and test results available.  Performed  Maximum sterile technique was used including antiseptics, cap, gloves, gown, hand hygiene, mask and sheet. Skin prep: Chlorhexidine; local anesthetic administered A antimicrobial bonded/coated triple lumen catheter was placed in the right internal jugular vein using the Seldinger technique.  Ultrasound was used to verify the patency of the vein and for real time needle guidance.  Evaluation Blood flow good Complications: No apparent complications Patient did tolerate procedure well. Chest X-ray ordered to verify placement.  CXR: pending.  Roselie Awkward 09/03/2019, 2:49 PM

## 2019-09-03 NOTE — ED Notes (Signed)
Patient given two apple juices

## 2019-09-03 NOTE — Progress Notes (Addendum)
PROGRESS NOTE  [facility]  OBS:[id number] DOB: [birth year] DOA: 09/02/2019 PCP: [name], MD  Outpatient Specialists: Oncology, Dr. Lindi Adie Brief Narrative: Charlene Perry is a 69 y.o. female with medical history significant for breast cancer s/p chemotherapy, surgery, chronic diastolic CHF, hypothyroidism, hypertension, and type 2 diabetes mellitus, now presenting to the emergency department with several days of dysuria, loss of appetite, nausea, vomiting, diarrhea, and fatigue.  The patient reports that she was started on ciprofloxacin a few days ago, dysuria began to improve, but she continued to have fatigue, loss of appetite, nausea, nonbloody vomiting, and nonbloody loose stools.   In the EDshe was tachycardic, afebrile, saturating well on room air. CBC with normocytic anemia. Creatinine up to 6.8 from 1.14, potassium 5.3, bicarbonate 18. Lactic acid was 6.6. CT renal stone study with mild left hydronephrosis and diffuse circumferential wall thickening of the urinary bladder consistent with acute urinary tract infection, but no obstructing renal or ureteral calculus is noted.  Blood and urine cultures were collected ceftriaxone started, and a total of 4L IVF given due to persistent lactic acidosis and renal failure.   Assessment & Plan: Principal Problem:   Acute kidney injury ([place]) Active Problems:   Breast cancer, right ([place])   Sepsis secondary to UTI ([place])   Diabetes mellitus type II, non insulin dependent ([place])   Lactic acidosis  Sepsis due to UTI with lactic acidosis:  - Continue bicarbonate in IVF, s/p 4L resuscitation thus far, also getting albumin. Hemodynamics are stable.  - Appreciate PCCM recommendations.  - Continue ceftriaxone pending urine culture, will also follow blood cultures. UA grossly positive despite cipro as outpatient. - Trend lactic acid, though suspect impaired clearance with renal failure.  Acute renal failure with  hyperkalemia, acidosis: Baseline renal function appears normal. Patient has seen urology for renal lesion but not nephrology in the past. - Not appreciably improved with IVF in ED.  - Giving bicarbonate in IVF, no evidence of overload, so suspect considerable losses PTA.  - Lokelma ordered. Hold losartan and K supplement. - No UOP yet, insert foley - Holding diuretic and metformin. - Consulted nephrology, Dr. Jonnie Finner for further recommendations.   T2DM with hypoglycemia: Last HbA1c 6.8% in 2019.  - Manage hypoglycemia with po as tolerated.  - Monitor CBGs. Holding metformin. Can continue very sensitive SSI.  Obesity: Body mass index is 34.54 kg/m.   History of covid-19 infection: +testing in January. Was asymptomatic, tested due to positive exposure. - No indication for isolation at this time.  - CXR due to covid-positivity showing right sided airspace opacities though no hypoxemia. On CTX as above.  Pulmonary nodule:  - Noted, discussed need for follow up imaging with patient.   History of breast CA:  - D/w patient need for continued follow up of nodule as above. - Note still has port, no tenderness at site.  - Continue letrozole.  Hypothyroidism:  - Continue synthroid.   DVT prophylaxis: Heparin Code Status: Full Family Communication: None at bedside Disposition Plan: Patient from home, critically ill so ultimate disposition unclear.   Consultants:   PCCM  Nephrology  Procedures:   None  Antimicrobials:  Ceftriaxone   Subjective: Feels weak, denies shortness of breath or chest pain. No tenderness at port site. Does not feel febrile. Urinary symptoms have improved, though she has had nausea, vomiting, still feels she may vomit at times, and diarrhea.   Objective: Vitals:   09/03/19 0630 09/03/19 0700 09/03/19 0730 09/03/19 0800  BP: 139/69  139/75 (!) 143/78 133/87  Pulse:  (!) 113 (!) 110 (!) 111  Resp: 19 (!) 22 (!) 21 (!) 24  Temp:      TempSrc:        SpO2:  96% 96% 97%  Weight:      Height:        Intake/Output Summary (Last 24 hours) at 09/03/2019 0824 Last data filed at 09/03/2019 0400 Gross per 24 hour  Intake 2981.35 ml  Output --  Net 2981.35 ml   Filed Weights   09/02/19 1754  Weight: 97.1 kg    Gen: 69 y.o. female in no obvious distress Pulm: Non-labored breathing room air with mask on. No basilar crackles or wheezes. CV: Regular rate and rhythm. No murmur, rub, or gallop. No JVD, no pedal edema. GI: Abdomen soft, not significantly tender, non-distended, with normoactive bowel sounds. No organomegaly or masses felt. Ext: Warm, no deformities Skin: No rashes, lesions or ulcers. Port site c/d/i, nontender without erythema or induration. Neuro: Alert and oriented. No focal neurological deficits. Psych: Judgement and insight appear normal. Mood & affect appropriate.   Data Reviewed: I have personally reviewed following labs and imaging studies  CBC: Recent Labs  Lab 09/02/19 1758 09/03/19 0430  WBC 8.2 9.0  NEUTROABS  --  7.0  HGB 11.4* 10.2*  HCT 35.9* 32.4*  MCV 91.3 91.3  PLT 253 659   Basic Metabolic Panel: Recent Labs  Lab 09/02/19 1758 09/03/19 0430  NA 136 136  K 5.3* 5.7*  CL 97* 98  CO2 18* 17*  GLUCOSE 97 71  BUN 67* 68*  CREATININE 6.80* 7.13*  CALCIUM 9.3 8.4*   GFR: Estimated Creatinine Clearance: 9.1 mL/min (A) (by C-G formula based on SCr of 7.13 mg/dL (H)). Liver Function Tests: Recent Labs  Lab 09/02/19 1758 09/03/19 0430  AST 22 25  ALT 16 18  ALKPHOS 73 66  BILITOT 0.3 0.6  PROT 7.8 6.0*  ALBUMIN 3.6 2.7*   Recent Labs  Lab 09/02/19 1758  LIPASE 79*   No results for input(s): AMMONIA in the last 168 hours. Coagulation Profile: Recent Labs  Lab 09/02/19 2310  INR 1.2   Cardiac Enzymes: No results for input(s): CKTOTAL, CKMB, CKMBINDEX, TROPONINI in the last 168 hours. BNP (last 3 results) No results for input(s): PROBNP in the last 8760 hours. HbA1C: No  results for input(s): HGBA1C in the last 72 hours. CBG: Recent Labs  Lab 09/03/19 0821  GLUCAP 59*   Lipid Profile: No results for input(s): CHOL, HDL, LDLCALC, TRIG, CHOLHDL, LDLDIRECT in the last 72 hours. Thyroid Function Tests: No results for input(s): TSH, T4TOTAL, FREET4, T3FREE, THYROIDAB in the last 72 hours. Anemia Panel: No results for input(s): VITAMINB12, FOLATE, FERRITIN, TIBC, IRON, RETICCTPCT in the last 72 hours. Urine analysis:    Component Value Date/Time   COLORURINE AMBER (A) 09/02/2019 1918   APPEARANCEUR TURBID (A) 09/02/2019 1918   LABSPEC 1.016 09/02/2019 1918   PHURINE 5.0 09/02/2019 1918   GLUCOSEU 50 (A) 09/02/2019 1918   HGBUR LARGE (A) 09/02/2019 1918   BILIRUBINUR NEGATIVE 09/02/2019 1918   KETONESUR NEGATIVE 09/02/2019 1918   PROTEINUR 100 (A) 09/02/2019 1918   NITRITE NEGATIVE 09/02/2019 1918   LEUKOCYTESUR LARGE (A) 09/02/2019 1918   Recent Results (from the past 240 hour(s))  Culture, blood (routine x 2)     Status: None (Preliminary result)   Collection Time: 09/02/19  8:09 PM   Specimen: BLOOD  Result Value Ref Range Status  Specimen Description   Final    BLOOD LEFT ANTECUBITAL Performed at [facility], [place] [address] [place], [place] [address]    Special Requests   Final    BOTTLES DRAWN AEROBIC AND ANAEROBIC Blood Culture adequate volume Performed at [facility] [address] [place], [place] [address]    Culture   Final    NO GROWTH < 12 HOURS Performed at [facility] [address] [place], [place] [address]    Report Status PENDING  Incomplete  SARS CORONAVIRUS 2 (TAT 6-24 HRS) Nasopharyngeal Nasopharyngeal Swab     Status: Abnormal   Collection Time: 09/02/19 11:10 PM   Specimen: Nasopharyngeal Swab  Result Value Ref Range Status   SARS Coronavirus 2 POSITIVE (A) NEGATIVE Final    Comment: RESULT CALLED TO, READ BACK BY AND VERIFIED WITH: RN J TAOKINGTON @0640  09/03/19 BY S  GEZAHEGN (NOTE) SARS-CoV-2 target nucleic acids are DETECTED. The SARS-CoV-2 RNA is generally detectable in upper and lower respiratory specimens during the acute phase of infection. Positive results are indicative of the presence of SARS-CoV-2 RNA. Clinical correlation with patient history and other diagnostic information is  necessary to determine patient infection status. Positive results do not rule out bacterial infection or co-infection with other viruses.  The expected result is Negative. Fact Sheet for Patients: SugarRoll.be Fact Sheet for Healthcare Providers: https://www.woods-mathews.com/ This test is not yet approved or cleared by the [place] FDA and  has been authorized for detection and/or diagnosis of SARS-CoV-2 by FDA under an Emergency Use Authorization (EUA). This EUA will remain  in effect (meaning this test can be used)  for the duration of the COVID-19 declaration under Section 564(b)(1) of the Act, 21 U.S.C. section 360bbb-3(b)(1), unless the authorization is terminated or revoked sooner. Performed at [facility], [place] [address] [place], [place] [address]       Radiology Studies: DG CHEST PORT 1 VIEW  Result Date: 09/03/2019 CLINICAL DATA:  Shortness of breath. EXAM: PORTABLE CHEST 1 VIEW COMPARISON:  August 19, 2018. FINDINGS: The heart size and mediastinal contours are within normal limits. No pneumothorax or pleural effusion is noted. Left lung is clear. Left internal jugular Port-A-Cath is unchanged in position. Interval development of right upper and lower lobe airspace opacities are noted concerning for possible pneumonia. The visualized skeletal structures are unremarkable. IMPRESSION: Interval development of right upper and lower lobe airspace opacities concerning for possible pneumonia. Follow-up radiographs are recommended to ensure resolution. Electronically Signed   By: Marijo Conception M.D.   On:  09/03/2019 08:06   CT Renal Stone Study  Result Date: 09/02/2019 CLINICAL DATA:  Flank pain. EXAM: CT ABDOMEN AND PELVIS WITHOUT CONTRAST TECHNIQUE: Multidetector CT imaging of the abdomen and pelvis was performed following the standard protocol without IV contrast. COMPARISON:  March 15, 2018 FINDINGS: Lower chest: A focal 9 mm solid pleural based nodularity in the right middle lobe on axial series 2, image 7 new since September 2019 with adjacent streaky consolidation in the right middle lobe, possibly aspiration related. Follow-up imaging is indicated to exclude an underlying metastatic nodule given postoperative changes from right mastectomy. Normal heart size. Mild mitral valvular calcification. Hepatobiliary: Cholecystectomy. Mild hepatomegaly. Benign hepatic cysts, similarly sized; no further imaging characterization recommended. Chronic 10 mm dilatation of the proximal common bile duct with normal distal tapering and no intrahepatic biliary duct dilatation. Pancreas: Normal. Spleen: Normal. Adrenals/Urinary Tract: Stable 1.8 cm benign adrenal adenoma measuring  10 Hounsfield units. Normal right adrenal gland. Bilateral moderate perinephric inflammatory changes, slightly increased. Nonobstructing right renal punctate calculus. Mild left hydronephrosis without an obstructing renal or ureteral calculus. Decompressed bilateral ureters. Diffusely thickened urinary bladder with perivesicular stranding. Stomach/Bowel: Mild nonspecific bowel wall thickening of the duodenum and left upper abdominal quadrant jejunum without perforation or free fluid. Mild adjacent inflammatory change. Reactive appearing lymph nodes in the associated mesenteric root. Nonobstructed. No colonic or gastric mucosal thickening. Small hiatal hernia. Normal appendix, axial series 2, image 67. Mild diverticulosis coli in the sigmoid and proximal transverse colon. Vascular/Lymphatic: Aortic calcified atherosclerosis. No abdominal aortic  aneurysm. No adenopathy. Reproductive: Atrophic or surgically absent uterus. No adnexal cyst or mass. Other: Midline infraumbilical hernia containing non-strangulated, non incarcerated and nonobstructed small bowel loops. Small uncomplicated periumbilical herniation of fat. Musculoskeletal: Moderate degenerative change. Probable benign bone islands in the left hip. A sternal foramen. I discussed critical results and additional imaging recommendations by telephone at the time of interpretation on 09/02/2019 at 9:30 p.m. with provider Davonna Belling , who verbally acknowledged these results. IMPRESSION: Mild left hydronephrosis and diffuse circumferential wall thickening of the urinary bladder with mild perivesicular stranding. Findings support a clinical diagnosis of an acute cystitis with upper urinary tract infection. Bilateral moderate perinephric stranding could represent infection or medical renal disease. No obstructing renal or ureteral calculus. Punctate nonobstructing right renal calculus. A 9 mm solid pleural based right middle lobe pulmonary nodularity with adjacent streaky consolidation, which could be aspiration-related. Outpatient unenhanced CT chest recommended with surveillance CT Chest in 3 months should this nodularity persist. A metastatic or malignant pulmonary nodule are differential considerations, particularly given the history of right mastectomy. Mild enteritis, consistent with the patient history of nausea and vomiting. Hepatomegaly and stable benign hepatic cysts for which no further imaging characterization or surveillance is recommended. Uncomplicated infraumbilical abdominal wall herniation of small bowel loops without strangulation, incarceration or obstruction. Stable size of a small left adrenal benign adenoma. Electronically Signed   By: Revonda Humphrey   On: 09/02/2019 21:31    Scheduled Meds: . heparin  5,000 Units Subcutaneous Q8H  . insulin aspart  0-6 Units Subcutaneous TID  WC  . letrozole  2.5 mg Oral Daily  . levothyroxine  125 mcg Oral QAC breakfast  . sodium chloride flush  3 mL Intravenous Q12H  . sodium zirconium cyclosilicate  10 g Oral BID   Continuous Infusions: . cefTRIAXone (ROCEPHIN)  IV    .  sodium bicarbonate (isotonic) infusion in sterile water 100 mL/hr at 09/03/19 0704     LOS: 1 day   Time spent: 35 minutes.  Patrecia Pour, MD [facility] [email or web address] 09/03/2019, 8:24 AM

## 2019-09-03 NOTE — ED Notes (Signed)
Pt experienced 1 episode of vomiting.   Dr. Myna Hidalgo made aware. MD made aware that Lactic Acid is 8.0

## 2019-09-03 NOTE — ED Notes (Signed)
Patient given Two Apple juices

## 2019-09-03 NOTE — Progress Notes (Signed)
Pt stated she wears her cpap sometimes at home and does not want to wear it tonight.  Pt was advised that RT is available all night should she change her mind.

## 2019-09-03 NOTE — ED Notes (Signed)
Pt was bladder scanned. 33mL of urine in bladder. Pt reports she does not need to use the bathroom.

## 2019-09-03 NOTE — Consult Note (Signed)
Renal Service Consult Note [facility]  Charlene Perry 09/03/2019 Charlene Perry Requesting Physician:  Dr Lake Bells  Reason for Consult:  Acute renal failure HPI: The patient is a 69 y.o. year-old with hx of HTN, DM, breast cancer (2020), DJD who presents to ED yesterday w/ nausea, vomiting and diarrhea for a few days, has been on po Cipro x 4d for UTI.  In ED creat was 7.7 so patient admitted to ICU.  Asked to see for AKI.    B/L creat in June 2020 was 1.0.  Pt denies hx of kidney failure. No nsaid use, is on ARB losartan and take metformin for DM2.  She has been "sick" for a week and worsening over the last 3 days w/ frequent N/V and diarrhea.  Also very fatigues and listless.  No fevers, chills or sweats.  No CP or abd pain or flank pain. No dysuria.   ROS  denies CP  no joint pain   no HA  no blurry vision  no rash  no dysuria  no difficulty voiding  no change in urine color    Past Medical History  Past Medical History:  Diagnosis Date  . Arthritis   . Breast cancer in female [facility])    Right  . Cancer ([place])   . Diabetes mellitus without complication ([place])   . Family history of breast cancer   . Family history of colon cancer   . Hypertension    takes fluid pills  . Hypothyroidism   . Pink eye disease of left eye    went to eye md and received drops - yesterday.  . Sleep apnea    back in the 1990's  . Vitiligo    Past Surgical History  Past Surgical History:  Procedure Laterality Date  . AXILLARY LYMPH NODE DISSECTION Right 07/13/2018   Procedure: RIGHT AXILLARY LYMPH NODE DISSECTION;  Surgeon: [name], MD;  Location: [facility];  Service: General;  Laterality: Right;  . BREAST BIOPSY Bilateral 01/2019  . BREAST SURGERY     left breast fibro adenoma  . CATARACT EXTRACTION Right 2019  . CERVICAL FUSION N/A    C5 &6  . CESAREAN SECTION    . CHOLECYSTECTOMY  2004-2005  . CYSTECTOMY     patient denies  . EYE SURGERY    . IR IMAGING  GUIDED PORT INSERTION  07/29/2018  . MASTECTOMY Right 06/21/2018  . MASTECTOMY WITH RADIOACTIVE SEED GUIDED EXCISION AND AXILLARY SENTINEL LYMPH NODE BIOPSY Right 06/21/2018  . MASTECTOMY WITH RADIOACTIVE SEED GUIDED EXCISION AND AXILLARY SENTINEL LYMPH NODE BIOPSY Right 06/21/2018   Procedure: RIGHT TOTAL MASTECTOMY WITH RIGHT AXILLARY SENTINEL NODE BIOPSY AND RIGHT SEED GUIDED NODE EXCISION;  Surgeon: [name], MD;  Location: [facility];  Service: General;  Laterality: Right;  . MYOMECTOMY  1986  . PARTIAL HYSTERECTOMY  1995  . PILONIDAL CYST EXCISION    . PORTACATH PLACEMENT N/A 07/13/2018   Procedure: INSERTION PORT-A-CATH WITH ULTRASOUND;  Surgeon: [name], MD;  Location: [facility];  Service: General;  Laterality: N/A;  . TONSILLECTOMY  2002  . UVULOPALATOPHARYNGOPLASTY, TONSILLECTOMY AND SEPTOPLASTY     Family History  Family History  Problem Relation Age of Onset  . Colon cancer Mother 60  . Colon cancer Sister 98  . Colon cancer Brother        ex early 43's  . Breast cancer Other        46's   Social History  reports that she  has never smoked. She has never used smokeless tobacco. She reports previous alcohol use. She reports that she does not use drugs. Allergies No Known Allergies Home medications Prior to Admission medications   Medication Sig Start Date End Date Taking? Authorizing Provider  aspirin EC 81 MG tablet Take 81 mg by mouth daily.   Yes [provider]  celecoxib (CELEBREX) 200 MG capsule Take 200 mg by mouth daily.   Yes [provider]  ciprofloxacin (CIPRO) 500 MG tablet Take 500 mg by mouth in the morning and at bedtime. For 7 days, starting 08/29/2019. 08/29/19  Yes [provider]  furosemide (LASIX) 40 MG tablet Take 80 mg by mouth daily.   Yes [provider]  gabapentin (NEURONTIN) 400 MG capsule Take 400 mg by mouth 2 (two) times daily.   Yes [provider]  letrozole (FEMARA) 2.5 MG tablet Take 1  tablet (2.5 mg total) by mouth daily. 03/07/19  Yes [name], MD  levothyroxine (SYNTHROID, LEVOTHROID) 125 MCG tablet Take 125 mcg by mouth daily before breakfast.   Yes [provider]  losartan (COZAAR) 25 MG tablet Take 25 mg by mouth every morning. 07/23/19  Yes [provider]  metFORMIN (GLUCOPHAGE) 1000 MG tablet Take 1,000 mg by mouth 2 (two) times daily with a meal.   Yes [provider]  potassium chloride (KLOR-CON) 10 MEQ tablet Take 10 mEq by mouth 2 (two) times daily. 06/17/19  Yes [provider]  rosuvastatin (CRESTOR) 40 MG tablet Take 40 mg by mouth at bedtime.    Yes [provider]     Vitals:   09/03/19 1230 09/03/19 1245 09/03/19 1312 09/03/19 1400  BP: (!) 134/108 (!) 152/67 (!) 86/59 (!) 84/72  Pulse: (!) 107  (!) 114 (!) 116  Resp: (!) 23 20 20    Temp: 98.7 F (37.1 C) 98.7 F (37.1 C) 98 F (36.7 C)   TempSrc:   Oral   SpO2: 97%  100% 97%  Weight:   103.1 kg   Height:   5\' 6"  (1.676 m)    Exam Gen obese pleasant WF, no distress No rash, cyanosis or gangrene Sclera anicteric, throat clear  No jvd or bruits Chest clear bilat to bases no rales, wheezing or bronchial BS RRR no MRG Abd soft ntnd no mass or ascites +bs GU foley w/ dark amber urine small amts MS no joint effusions or deformity Ext no sig LE edema, no wounds or ulcers Neuro is alert, Ox 3 , nf, tired appearing    Home meds:  - aspirin 81/ rosuvastatin 40  - losartan 25 hs/ furosemide 80 qd  - synthroid 125/ kdur 10 bid/ gabapentin 400 bid  - cipro x 7d  - celecoxib 200 qd  - metformin 1gm bid  - prn's/ vitamins/ supplements    UA 3/19 > turbid, many bact/ > 50 rbc/ wbc/ epi, +wbc clumps   Urine sediment (undersigned, 3/20) > sheets of monomorphic RBC's w/ RBC clumps, 5-10 wbc/ hpf, some scattered dense granular casts, no rbc/ wbc casts    CXR 3/20 > RUL infiltrates    CT abd renal stone 3/29 > Bilat  perinephric inflammatory changes...  nonobstructing right renal punctate calculus. Mild left hydronephrosis w/o obstructing renal or ureteral calculus,. decompressed bilateral ureters, diffusely thickened urinary bladder with perivesicular stranding    Na 136 K 5.3 OC2 17  BUN 78  Cr 7.73  Ca 8.3  Alb 2.7      LFT's  ok  LA 6.3     WBC 9k  Hb 10  plt 241    Assessment/ Plan: 1. Acute renal failure - b/l creat 1.0 from 11/2018. Sediment c/w UTI and poss ATN, doubt GN.  Pt sig dehydrated from recent GI illness, BP's soft in ICU today.  On exam looks dry. +ARB and lasix at home.  Suspect AKI due to ARB/ vol depletion/ hypotension. Less likely AIN from Cipro, started 4d ago. Pt looks uremic, will plan CRRT for now, aggressive IV hydration, foley cath. Keep + 100 cc/hr.  Urine lytes. Will follow.  2. H/o breast Ca - in 2020 3. N/V/ diarrhea - prob GI in origin, could have some uremic component now 4. DM2 5. HTN - hold all BP meds, let vol / BP's come up      Kelly Splinter  MD 09/03/2019, 2:49 PM  Recent Labs  Lab 09/02/19 1758 09/03/19 0430  WBC 8.2 9.0  HGB 11.4* 10.2*   Recent Labs  Lab 09/02/19 1758 09/02/19 1758 09/03/19 0430 09/03/19 1151  K 5.3*   < > 5.7* 5.3*  BUN 67*   < > 68* 78*  CREATININE 6.80*   < > 7.13* 7.73*  CALCIUM 9.3  --  8.4* 8.3*   < > = values in this interval not displayed.

## 2019-09-04 LAB — APTT: aPTT: 41 seconds — ABNORMAL HIGH (ref 24–36)

## 2019-09-04 LAB — URINE CULTURE: Culture: >=100000 — AB

## 2019-09-04 LAB — RENAL FUNCTION PANEL
Albumin: 2.9 g/dL — ABNORMAL LOW (ref 3.5–5.0)
Albumin: 3 g/dL — ABNORMAL LOW (ref 3.5–5.0)
Anion gap: 11 (ref 5–15)
Anion gap: 13 (ref 5–15)
BUN: 60 mg/dL — ABNORMAL HIGH (ref 8–23)
BUN: 58 mg/dL — ABNORMAL HIGH (ref 8–23)
CO2: 25 mmol/L (ref 22–32)
CO2: 25 mmol/L (ref 22–32)
Calcium: 7.7 mg/dL — ABNORMAL LOW (ref 8.9–10.3)
Calcium: 8.1 mg/dL — ABNORMAL LOW (ref 8.9–10.3)
Chloride: 100 mmol/L (ref 98–111)
Chloride: 98 mmol/L (ref 98–111)
Creatinine, Ser: 5.34 mg/dL — ABNORMAL HIGH (ref 0.44–1.00)
Creatinine, Ser: 6.29 mg/dL — ABNORMAL HIGH (ref 0.44–1.00)
GFR calc Af Amer: 9 mL/min — ABNORMAL LOW (ref >60)
GFR calc Af Amer: 7 mL/min — ABNORMAL LOW (ref >60)
GFR calc non Af Amer: 8 mL/min — ABNORMAL LOW (ref >60)
GFR calc non Af Amer: 6 mL/min — ABNORMAL LOW (ref >60)
Glucose, Bld: 126 mg/dL — ABNORMAL HIGH (ref 70–99)
Glucose, Bld: 176 mg/dL — ABNORMAL HIGH (ref 70–99)
Phosphorus: 4.8 mg/dL — ABNORMAL HIGH (ref 2.5–4.6)
Phosphorus: 5.2 mg/dL — ABNORMAL HIGH (ref 2.5–4.6)
Potassium: 4.5 mmol/L (ref 3.5–5.1)
Potassium: 4.5 mmol/L (ref 3.5–5.1)
Sodium: 136 mmol/L (ref 135–145)
Sodium: 136 mmol/L (ref 135–145)

## 2019-09-04 LAB — GLUCOSE, CAPILLARY
Glucose-Capillary: 119 mg/dL — ABNORMAL HIGH (ref 70–99)
Glucose-Capillary: 143 mg/dL — ABNORMAL HIGH (ref 70–99)
Glucose-Capillary: 178 mg/dL — ABNORMAL HIGH (ref 70–99)
Glucose-Capillary: 195 mg/dL — ABNORMAL HIGH (ref 70–99)
Glucose-Capillary: 209 mg/dL — ABNORMAL HIGH (ref 70–99)
Glucose-Capillary: 451 mg/dL — ABNORMAL HIGH (ref 70–99)

## 2019-09-04 LAB — MAGNESIUM: Magnesium: 2.1 mg/dL (ref 1.7–2.4)

## 2019-09-04 MED ORDER — HEPARIN BOLUS VIA INFUSION (CRRT)
1000.0000 [IU] | INTRAVENOUS | Status: DC | PRN
  Administered 2019-09-04 – 2019-09-05 (×5): 1000 [IU] via INTRAVENOUS_CENTRAL
  Filled 2019-09-04: qty 1000

## 2019-09-04 MED ORDER — INSULIN ASPART 100 UNIT/ML ~~LOC~~ SOLN
2.0000 [IU] | SUBCUTANEOUS | Status: DC
  Administered 2019-09-04: 4 [IU] via SUBCUTANEOUS
  Administered 2019-09-04 – 2019-09-05 (×2): 6 [IU] via SUBCUTANEOUS
  Administered 2019-09-05: 4 [IU] via SUBCUTANEOUS
  Administered 2019-09-05 – 2019-09-06 (×3): 2 [IU] via SUBCUTANEOUS

## 2019-09-04 MED ORDER — SODIUM CHLORIDE 0.9 % IV SOLN: INTRAVENOUS | Status: DC

## 2019-09-04 MED ORDER — SODIUM CHLORIDE 0.9 % IV SOLN
250.0000 [IU]/h | INTRAVENOUS | Status: DC
  Administered 2019-09-04: 250 [IU]/h via INTRAVENOUS_CENTRAL
  Administered 2019-09-04: 350 [IU]/h via INTRAVENOUS_CENTRAL
  Administered 2019-09-05: 850 [IU]/h via INTRAVENOUS_CENTRAL
  Administered 2019-09-05 (×2): 250 [IU]/h via INTRAVENOUS_CENTRAL
  Filled 2019-09-04 (×2): qty 2

## 2019-09-04 MED ORDER — MIDAZOLAM HCL 2 MG/2ML IJ SOLN
INTRAMUSCULAR | Status: AC
  Administered 2019-09-04: 1 mg
  Filled 2019-09-04: qty 2

## 2019-09-04 NOTE — Progress Notes (Signed)
Pt declined use of cpap tonight.  Pt was advised that RT is available all night should she change her mind.

## 2019-09-04 NOTE — Procedures (Signed)
Hemodialysis Catheter Insertion Procedure Note Charlene Perry [facility] [id number] 1951/03/06  Procedure: Insertion of Hemodialysis Catheter Indications: Dialysis Access   Procedure Details Consent: Risks of procedure as well as the alternatives and risks of each were explained to the (patient/caregiver).  Consent for procedure obtained. Time Out: Verified patient identification, verified procedure, site/side was marked, verified correct patient position, special equipment/implants available, medications/allergies/relevent history reviewed, required imaging and test results available.  Performed  Maximum sterile technique was used including antiseptics, cap, gloves, gown, hand hygiene, mask and sheet. Skin prep: Chlorhexidine; local anesthetic administered Triple lumen hemodialysis catheter was inserted into right femoral vein due to other access sites not amenable due to need for dialysis, port in place, poor RIJ access, no other available access using the Seldinger technique.  Evaluation Blood flow good Complications: No apparent complications Patient did tolerate procedure well. Chest X-ray ordered to verify placement.  CXR: not required.   Julian Hy 09/04/2019

## 2019-09-04 NOTE — Progress Notes (Signed)
NAME:  Charlene Perry, MRN:  [id number], DOB:  [birth year], LOS: 2 ADMISSION DATE:  09/02/2019, CONSULTATION DATE:  3/20 REFERRING MD:  Opyd, CHIEF COMPLAINT:  fatigue   Brief History   69 y/o female admitted on 3/20 from the [facility] ED with severe sepsis from a urinary tract infection.  Had [place] in January 2021.  History of present illness   This is a pleasant 69 y/o female with a history of breast cancer who was treated for that in 2020 who came to the [facility] with N/V/D and fatigue on 3/19.  She developed dysuria and inability to empty her bladder around 3/15, then went to her PCP and was prescribed cipro which she took on 3/16.  She said that she had no relief of symptoms which at that time included back pain, fatigue, and anorexia.  Her symptoms worsened throughout the week and she developed worsening fatigue and then eventually nausea, vomiting, diarrhea on 3/19.  She did not eat much on 3/18 or 3/19.  She came to the ED where she was found to have severe AKI. She had COVID in January 2019 after exposed to her grandson who had it. She had no symptoms.  Past Medical History  DM2 Breast cancer Hypertension Hypothryroidism Sleep apnea, obstructive, on CPAP   Significant Hospital Events   3/20 admission  Consults:  PCCM  Procedures:    Significant Diagnostic Tests:  3/20 CT stone protocol abd/pelvis> 41mm RLL nodule, left perinephric stranding and swelling, some swelling of bladder  Micro Data:  3/19 SARS COV 2 > positive 3/19 blood culture >   Antimicrobials:  3/19 ceftriaxone >   Interim history/subjective:  As above  Objective   Blood pressure 135/79, pulse (!) 111, temperature 99 F (37.2 C), resp. rate (!) 21, height 5\' 6"  (1.676 m), weight 103.3 kg, SpO2 93 %.        Intake/Output Summary (Last 24 hours) at 09/04/2019 1842 Last data filed at 09/04/2019 1824 Gross per 24 hour  Intake 616.69 ml  Output 839 ml  Net -222.31 ml   Filed Weights   09/02/19 1754  09/03/19 1312 09/04/19 0500  Weight: 97.1 kg 103.1 kg 103.3 kg    Examination: General: laying in bed comfortably HENT: [place]/AT, eyes anicteric PULM: CTAB, breathing comfortably on RA CV: RRR, no murmurs GI: abd soft, NT, ND MSK: normal muscle mass for age Neuro: awake & alert, answering questions appropriately, moving all extremities   Resolved Hospital Problem list     Assessment & Plan:  AKI in setting of hypovolemia from severe sepsis and nausea, vomiting, diarrhea: -con't volume resuscitation  #Continue Foley -CRRT to be restarted.  Dialysis catheter replaced. -Continue to monitor urine output and BMP  Hyperkalemia; resolved -Continue CRRT  Severe sepsis from UTI/Pyelonephritis: -Continue ceftriaxone -Continue to follow blood cultures  OSA -CPAP qHS  COVID 19 test positive, recovered: she had this in January, was asymptomatic, tested positive; currently outside her 21 day window, can come off airborne precautions  Rising/flat lactic acid in setting of volume resuscitation and currently not in shock, no abdominal pain: not clearing due to worsening renal failure Monitor lactic acid Continue crystalloid infusion  Diabetes with hyperglycemia, A1c 7.4 -Accu-Cheks every 4 -Sliding scale insulin as needed    Best practice:  Diet: advance as tolerated Pain/Anxiety/Delirium protocol (if indicated): n/a VAP protocol (if indicated): n/a DVT prophylaxis: sub q heparin GI prophylaxis: n/a Glucose control: per TRH Mobility: up ad lib Code Status: full Family Communication: husband  at bedside Disposition: admit to ICU  Labs   CBC: Recent Labs  Lab 09/02/19 1758 09/03/19 0430  WBC 8.2 9.0  NEUTROABS  --  7.0  HGB 11.4* 10.2*  HCT 35.9* 32.4*  MCV 91.3 91.3  PLT 253 256    Basic Metabolic Panel: Recent Labs  Lab 09/03/19 0430 09/03/19 1151 09/03/19 1548 09/04/19 0508 09/04/19 0810 09/04/19 1600  NA 136 136 138 136  --  136  K 5.7* 5.3* 5.2* 4.5  --   4.5  CL 98 96* 97* 100  --  98  CO2 17* 17* 19* 25  --  25  GLUCOSE 71 76 76 126*  --  176*  BUN 68* 78* 83* 60*  --  58*  CREATININE 7.13* 7.73* 7.69* 5.34*  --  6.29*  CALCIUM 8.4* 8.3* 8.2* 7.7*  --  8.1*  MG  --   --   --   --  2.1  --   PHOS  --   --  8.4* 4.8*  --  5.2*   GFR: Estimated Creatinine Clearance: 10.7 mL/min (A) (by C-G formula based on SCr of 6.29 mg/dL (H)). Recent Labs  Lab 09/02/19 1758 09/02/19 2055 09/02/19 2310 09/03/19 0130 09/03/19 0430 09/03/19 1151  WBC 8.2  --   --   --  9.0  --   LATICACIDVEN  --    < > 8.0* 7.6* 7.4* 6.3*   < > = values in this interval not displayed.    Liver Function Tests: Recent Labs  Lab 09/02/19 1758 09/03/19 0430 09/03/19 1548 09/04/19 0508 09/04/19 1600  AST 22 25  --   --   --   ALT 16 18  --   --   --   ALKPHOS 73 66  --   --   --   BILITOT 0.3 0.6  --   --   --   PROT 7.8 6.0*  --   --   --   ALBUMIN 3.6 2.7* 3.3* 2.9* 3.0*   Recent Labs  Lab 09/02/19 1758  LIPASE 79*   No results for input(s): AMMONIA in the last 168 hours.  ABG No results found for: PHART, PCO2ART, PO2ART, HCO3, TCO2, ACIDBASEDEF, O2SAT   Coagulation Profile: Recent Labs  Lab 09/02/19 2310  INR 1.2    Cardiac Enzymes: No results for input(s): CKTOTAL, CKMB, CKMBINDEX, TROPONINI in the last 168 hours.  HbA1C: Hgb A1c MFr Bld  Date/Time Value Ref Range Status  09/03/2019 04:30 AM 7.4 (H) 4.8 - 5.6 % Final    Comment:    (NOTE) Pre diabetes:          5.7%-6.4% Diabetes:              >6.4% Glycemic control for   <7.0% adults with diabetes   06/14/2018 03:25 PM 6.8 (H) 4.8 - 5.6 % Final    Comment:    (NOTE) Pre diabetes:          5.7%-6.4% Diabetes:              >6.4% Glycemic control for   <7.0% adults with diabetes     CBG: Recent Labs  Lab 09/03/19 2149 09/04/19 0752 09/04/19 1138 09/04/19 1141 09/04/19 1544  GLUCAP 129* 119* 451* 195* 143*     This patient is critically ill with multiple organ  system failure which requires frequent high complexity decision making, assessment, support, evaluation, and titration of therapies. This was completed through the application of advanced  monitoring technologies and extensive interpretation of multiple databases. During this encounter critical care time was devoted to patient care services described in this note for 32 minutes.   Julian Hy, DO 09/04/19 6:52 PM [facility]

## 2019-09-04 NOTE — Plan of Care (Signed)
  Problem: Education: Goal: Knowledge of disease and its progression will improve Outcome: Progressing   

## 2019-09-04 NOTE — Progress Notes (Signed)
Patient called out to RN stating she feels something wet going down her back.  The patients dialysis catheter was ninety percent out with active bleeding around the line.  RN removed the catheter and applied a pressure dressing.  Patient tolerated well with no s/s of distress noted.  Dr. Jonnie Finner notified of delay in restarting CRRT.  DO Clark updated and will come to place new dialysis catheter.

## 2019-09-04 NOTE — Progress Notes (Signed)
[facility] Progress Note  Subjective: nausea and fatigue somewhat better per patient.  CRRT on hold today as pt accidentally pulled her temp HD cath out.   Vitals:   09/04/19 0800 09/04/19 0900 09/04/19 1000 09/04/19 1100  BP: 135/78 (!) 160/83 129/68 135/79  Pulse: (!) 104 (!) 107 (!) 111 (!) 111  Resp: 10 20 16  (!) 21  Temp: 98.4 F (36.9 C) 98.2 F (36.8 C) 98.8 F (37.1 C) 99 F (37.2 C)  TempSrc:      SpO2: 99% 99% 94% 93%  Weight:      Height:        Exam: Gen obese pleasant WF, no distress No jvd or bruits Chest clear bilat  RRR no MRG Abd soft ntnd no mass or ascites +bs GU foley w/ dark amber urine small amts Ext no sig LE edema, no wounds or ulcers Neuro is alert, Ox 3 , nf, more awake    Home meds:  - aspirin 81/ rosuvastatin 40  - losartan 25 hs/ furosemide 80 qd  - synthroid 125/ kdur 10 bid/ gabapentin 400 bid  - cipro x 7d  - celecoxib 200 qd  - metformin 1gm bid  - prn's/ vitamins/ supplements    UA 3/19 > turbid, many bact/ > 50 rbc/ wbc/ epi, +wbc clumps   Urine sediment (undersigned, 3/20) > sheets of monomorphic RBC's w/ RBC clumps, 5-10 wbc/ hpf, some scattered dense granular casts, no rbc/ wbc casts    CXR 3/20 > RUL infiltrates    CT abd renal stone 3/29 > Bilat  perinephric inflammatory changes... nonobstructing right renal punctate calculus. Mild left hydronephrosis w/o obstructing renal or ureteral calculus,. decompressed bilateral ureters, diffusely thickened urinary bladder with perivesicular stranding    Na 136 K 5.3 OC2 17  BUN 78  Cr 7.73  Ca 8.3  Alb 2.7      LFT's ok  LA 6.3     WBC 9k  Hb 10  plt 241    Assessment/ Plan: 1. Acute renal failure - b/l creat 1.0 from 11/2018. Sediment c/w UTI and prob ATN due to vol depletion from GI illness + ARB+ lasix at home. Doubt GN.  Less likely AIN from Cipro. Pt was uremic , started CRRT yesterday 3/20. Not making urine at this time and will cont CRRT (awaiting temp cath  replacement). Keep 75 cc/hr + on CRRT, still has room for volume. Await signs of recovery.  Consider moving to [facility] tomorrow.  2. H/o breast Ca - in 2020 3. N/V/ diarrhea - GI in origin, could have been some uremic component too 4. DM2 5. HTN - holding all BP meds, let vol / BP's come up       [employer] 09/04/2019, 4:49 PM   Recent Labs  Lab 09/02/19 1758 09/02/19 1758 09/03/19 0430 09/03/19 1151 09/03/19 1548 09/04/19 0508  K 5.3*   < > 5.7*   < > 5.2* 4.5  BUN 67*   < > 68*   < > 83* 60*  CREATININE 6.80*   < > 7.13*   < > 7.69* 5.34*  CALCIUM 9.3   < > 8.4*   < > 8.2* 7.7*  PHOS  --   --   --   --  8.4* 4.8*  HGB 11.4*  --  10.2*  --   --   --    < > = values in this interval not displayed.   Inpatient medications: . Chlorhexidine Gluconate Cloth  6 each Topical Daily  . heparin  5,000 Units Subcutaneous Q8H  . insulin aspart  0-6 Units Subcutaneous TID WC  . letrozole  2.5 mg Oral Daily  . levothyroxine  125 mcg Oral QAC breakfast  . sodium chloride flush  3 mL Intravenous Q12H  . sodium zirconium cyclosilicate  10 g Oral BID   .  prismasol BGK 4/2.5 400 mL/hr at 09/03/19 1700  .  prismasol BGK 4/2.5 200 mL/hr at 09/04/19 5885  . sodium chloride    . cefTRIAXone (ROCEPHIN)  IV Stopped (09/03/19 2141)  . heparin 10,000 units/ 20 mL infusion syringe    . prismasol BGK 4/2.5 1,800 mL/hr at 09/04/19 0505   acetaminophen, alteplase, heparin, heparin, ondansetron (ZOFRAN) IV, sodium chloride

## 2019-09-05 LAB — POCT ACTIVATED CLOTTING TIME
Activated Clotting Time: 125 seconds
Activated Clotting Time: 0 seconds
Activated Clotting Time: 120 seconds
Activated Clotting Time: 345 seconds
Activated Clotting Time: 422 seconds
Activated Clotting Time: >1000 seconds
Activated Clotting Time: 136 seconds
Activated Clotting Time: 516 seconds
Activated Clotting Time: 967 seconds
Activated Clotting Time: 125 seconds
Activated Clotting Time: 142 seconds
Activated Clotting Time: 158 seconds
Activated Clotting Time: 153 seconds

## 2019-09-05 LAB — GLUCOSE, CAPILLARY
Glucose-Capillary: 82 mg/dL (ref 70–99)
Glucose-Capillary: 102 mg/dL — ABNORMAL HIGH (ref 70–99)
Glucose-Capillary: 132 mg/dL — ABNORMAL HIGH (ref 70–99)
Glucose-Capillary: 202 mg/dL — ABNORMAL HIGH (ref 70–99)
Glucose-Capillary: 195 mg/dL — ABNORMAL HIGH (ref 70–99)

## 2019-09-05 LAB — RENAL FUNCTION PANEL
Albumin: 2.6 g/dL — ABNORMAL LOW (ref 3.5–5.0)
Anion gap: 9 (ref 5–15)
BUN: 38 mg/dL — ABNORMAL HIGH (ref 8–23)
CO2: 25 mmol/L (ref 22–32)
Calcium: 7.8 mg/dL — ABNORMAL LOW (ref 8.9–10.3)
Chloride: 102 mmol/L (ref 98–111)
Creatinine, Ser: 4.67 mg/dL — ABNORMAL HIGH (ref 0.44–1.00)
GFR calc Af Amer: 11 mL/min — ABNORMAL LOW (ref >60)
GFR calc non Af Amer: 9 mL/min — ABNORMAL LOW (ref >60)
Glucose, Bld: 102 mg/dL — ABNORMAL HIGH (ref 70–99)
Phosphorus: 3.6 mg/dL (ref 2.5–4.6)
Potassium: 4.1 mmol/L (ref 3.5–5.1)
Sodium: 136 mmol/L (ref 135–145)

## 2019-09-05 LAB — MAGNESIUM: Magnesium: 2.3 mg/dL (ref 1.7–2.4)

## 2019-09-05 LAB — APTT: aPTT: 54 seconds — ABNORMAL HIGH (ref 24–36)

## 2019-09-05 MED ORDER — CARVEDILOL 12.5 MG PO TABS
12.5000 mg | ORAL_TABLET | Freq: Two times a day (BID) | ORAL | Status: DC
  Administered 2019-09-05 – 2019-09-09 (×7): 12.5 mg via ORAL
  Filled 2019-09-05 (×7): qty 1

## 2019-09-05 NOTE — Progress Notes (Signed)
[facility] Progress Note  Subjective: no appetite, no nausea though. No SOB or cough. Labs down w/ CRRT , BUN 38 and creat 4.67 this am. UOP still very marginal , about 100 cc yest.   Vitals:   09/05/19 0800 09/05/19 0813 09/05/19 0900 09/05/19 1000  BP: (!) 173/91 (!) 166/72 (!) 147/64 (!) 149/69  Pulse: (!) 106 (!) 106 (!) 111 (!) 108  Resp: 17 16 (!) 22 16  Temp: 99 F (37.2 C) 99 F (37.2 C) 98.8 F (37.1 C)   TempSrc:      SpO2: 100% 100% 95% 95%  Weight:      Height:        Exam: Gen obese pleasant WF, no distress No jvd or bruits Chest clear bilat  RRR no MRG Abd soft ntnd no mass or ascites +bs GU foley in place, urine today looks lighter and more clear but still low amt Ext no sig LE edema Neuro is alert, Ox 3 , nf    Home meds:  - aspirin 81/ rosuvastatin 40  - losartan 25 hs/ furosemide 80 qd  - synthroid 125/ kdur 10 bid/ gabapentin 400 bid  - cipro x 7d  - celecoxib 200 qd  - metformin 1gm bid  - prn's/ vitamins/ supplements    UA 3/19 > turbid, many bact/ > 50 rbc/ wbc/ epi, +wbc clumps   Urine sediment (undersigned, 3/20) > sheets of monomorphic RBC's w/ RBC clumps, 5-10 wbc/ hpf, some scattered dense granular casts, no rbc/ wbc casts    CXR 3/20 > RUL infiltrates    CT abd 3/29 > Bilat  perinephric inflammatory changes... nonobstructing right renal punctate calculus. Mild left hydronephrosis w/o obstructing renal or ureteral calculus,. decompressed bilateral ureters, diffusely thickened urinary bladder with perivesicular stranding   Assessment/ Plan: 1. Acute renal failure - normal renal fxn at baseline (1.0 from 11/2018). Sediment c/w UTI and ATN. Suspect ATN due to vol depletion/ GI illness + ARB+ home lasix. Doubt GN, less likely AIN from Cipro. Pt uremic and CRRT started on 3/20. UOP may be starting to improve. BP's stable and pt OOB.  Will dc CRRT today and tx to [facility] for iHD.  2. HTN/ volume - BP's rising now, avoid ARB/ losartan  will start coreg 12.5 bid.  Euvolemic on exam. Lower IVF to 40 /hr 3. H/o breast Ca - in 2020 4. N/V/ diarrhea - GI in origin, could have been some uremic component too 5. DM2       Charlene Perry 09/05/2019, 10:47 AM   Recent Labs  Lab 09/02/19 1758 09/02/19 1758 09/03/19 0430 09/03/19 1151 09/04/19 1600 09/05/19 0546  K 5.3*   < > 5.7*   < > 4.5 4.1  BUN 67*   < > 68*   < > 58* 38*  CREATININE 6.80*   < > 7.13*   < > 6.29* 4.67*  CALCIUM 9.3   < > 8.4*   < > 8.1* 7.8*  PHOS  --   --   --    < > 5.2* 3.6  HGB 11.4*  --  10.2*  --   --   --    < > = values in this interval not displayed.   Inpatient medications: . Chlorhexidine Gluconate Cloth  6 each Topical Daily  . heparin  5,000 Units Subcutaneous Q8H  . insulin aspart  2-6 Units Subcutaneous Q4H  . letrozole  2.5 mg Oral Daily  . levothyroxine  125 mcg Oral  QAC breakfast  . sodium chloride flush  3 mL Intravenous Q12H   .  prismasol BGK 4/2.5 400 mL/hr at 09/03/19 1700  .  prismasol BGK 4/2.5 200 mL/hr at 09/05/19 1036  . sodium chloride 75 mL/hr at 09/05/19 1004  . cefTRIAXone (ROCEPHIN)  IV Stopped (09/04/19 2041)  . heparin 10,000 units/ 20 mL infusion syringe Stopped (09/05/19 0600)  . prismasol BGK 4/2.5 1,800 mL/hr at 09/05/19 0827   acetaminophen, alteplase, heparin, heparin, ondansetron (ZOFRAN) IV, sodium chloride

## 2019-09-05 NOTE — Progress Notes (Signed)
Patient transferred via [facility] to 5W25c to [facility]. VSS, patient alert and oriented x4. Denies pain, no oxygen needed at this time. Left chest port accessed, NS running at 33ml/hr. Right groin HD trialysis catheter in place, heparin locked with blue caps, heparin sticker placed. Temp foley in place. Redness noted to right carotid after HD cath was removed on 3/21, site is clean and dry.   Report called to [employer].

## 2019-09-05 NOTE — Progress Notes (Signed)
No family called to give an update. Spouse was at bedside during shift change, and left at the end of visiting hours.

## 2019-09-05 NOTE — Progress Notes (Signed)
NAME:  Charlene Perry, MRN:  [id number], DOB:  1951/02/12, LOS: 3 ADMISSION DATE:  09/02/2019, CONSULTATION DATE:  3/20 REFERRING MD:  Opyd, CHIEF COMPLAINT:  Fatigue   Brief History   69 y/o female admitted on 3/20 from the [facility] ED with severe sepsis from a urinary tract infection.  Had [place] in January 2021.  Past Medical History  DM2 Breast cancer Hypertension Hypothryroidism Sleep apnea, obstructive, on CPAP  Significant Hospital Events   3/20 admission  Consults:  PCCM  Procedures:  3/20 R IJ HD cath > accidentally pulled out 3/21 R Femoral line CVL > accidentally pulled out  Significant Diagnostic Tests:  3/20 CT stone protocol abd/pelvis> 59mm RLL nodule, left perinephric stranding and swelling, some swelling of bladder  Micro Data:  3/19 SARS COV 2 > positive 3/19 blood culture >  Antimicrobials:  3/19 ceftriaxone >   Interim history/subjective:  Feels OK Appetite poor  Objective   Blood pressure (!) 167/98, pulse (!) 102, temperature 99 F (37.2 C), resp. rate 12, height 5\' 6"  (1.676 m), weight 104.1 kg, SpO2 100 %.        Intake/Output Summary (Last 24 hours) at 09/05/2019 0749 Last data filed at 09/05/2019 0700 Gross per 24 hour  Intake 1052.62 ml  Output 45 ml  Net 1007.62 ml   Filed Weights   09/03/19 1312 09/04/19 0500 09/05/19 0500  Weight: 103.1 kg 103.3 kg 104.1 kg    Examination: General:  Resting comfortably in bed HENT: NCAT OP clear PULM: CTA B, normal effort CV: RRR, no mgr GI: BS+, soft, nontender MSK: normal bulk and tone Neuro: awake, alert, no distress, MAEW   Resolved Hospital Problem list   Hyperkalemia COVID 19 test positive, recovered:  Assessment & Plan:  AKI in setting of hypovolemia from severe sepsis, nausea, vomiting, diarrhea: CVVHD today per renal Plan to change to intermittent hemodialysis per renal Monitor BMET and UOP Replace electrolytes as needed Move to [facility] for intermittent  hemodialysis  OSA CPAP qHS  Severe sepsis from UTI/Pyelonephritis> improved Continue ceftriaxone Monitor urine/blood culture  DM2 with hyperglycemia SSI   Best practice:  Diet: regular diet Pain/Anxiety/Delirium protocol (if indicated): n/a VAP protocol (if indicated): n/a DVT prophylaxis: sub cutaneous heparin GI prophylaxis: n/a Glucose control: SSI Mobility: bed rest Code Status: full Family Communication: updated sister bedside Disposition: remain in ICU  Labs   CBC: Recent Labs  Lab 09/02/19 1758 09/03/19 0430  WBC 8.2 9.0  NEUTROABS  --  7.0  HGB 11.4* 10.2*  HCT 35.9* 32.4*  MCV 91.3 91.3  PLT 253 093    Basic Metabolic Panel: Recent Labs  Lab 09/03/19 1151 09/03/19 1548 09/04/19 0508 09/04/19 0810 09/04/19 1600 09/05/19 0546  NA 136 138 136  --  136 136  K 5.3* 5.2* 4.5  --  4.5 4.1  CL 96* 97* 100  --  98 102  CO2 17* 19* 25  --  25 25  GLUCOSE 76 76 126*  --  176* 102*  BUN 78* 83* 60*  --  58* 38*  CREATININE 7.73* 7.69* 5.34*  --  6.29* 4.67*  CALCIUM 8.3* 8.2* 7.7*  --  8.1* 7.8*  MG  --   --   --  2.1  --  2.3  PHOS  --  8.4* 4.8*  --  5.2* 3.6   GFR: Estimated Creatinine Clearance: 14.4 mL/min (A) (by C-G formula based on SCr of 4.67 mg/dL (H)). Recent Labs  Lab 09/02/19 1758 09/02/19  2055 09/02/19 2310 09/03/19 0130 09/03/19 0430 09/03/19 1151  WBC 8.2  --   --   --  9.0  --   LATICACIDVEN  --    < > 8.0* 7.6* 7.4* 6.3*   < > = values in this interval not displayed.    Liver Function Tests: Recent Labs  Lab 09/02/19 1758 09/02/19 1758 09/03/19 0430 09/03/19 1548 09/04/19 0508 09/04/19 1600 09/05/19 0546  AST 22  --  25  --   --   --   --   ALT 16  --  18  --   --   --   --   ALKPHOS 73  --  66  --   --   --   --   BILITOT 0.3  --  0.6  --   --   --   --   PROT 7.8  --  6.0*  --   --   --   --   ALBUMIN 3.6   < > 2.7* 3.3* 2.9* 3.0* 2.6*   < > = values in this interval not displayed.   Recent Labs  Lab  09/02/19 1758  LIPASE 79*   No results for input(s): AMMONIA in the last 168 hours.  ABG No results found for: PHART, PCO2ART, PO2ART, HCO3, TCO2, ACIDBASEDEF, O2SAT   Coagulation Profile: Recent Labs  Lab 09/02/19 2310  INR 1.2    Cardiac Enzymes: No results for input(s): CKTOTAL, CKMB, CKMBINDEX, TROPONINI in the last 168 hours.  HbA1C: Hgb A1c MFr Bld  Date/Time Value Ref Range Status  09/03/2019 04:30 AM 7.4 (H) 4.8 - 5.6 % Final    Comment:    (NOTE) Pre diabetes:          5.7%-6.4% Diabetes:              >6.4% Glycemic control for   <7.0% adults with diabetes   06/14/2018 03:25 PM 6.8 (H) 4.8 - 5.6 % Final    Comment:    (NOTE) Pre diabetes:          5.7%-6.4% Diabetes:              >6.4% Glycemic control for   <7.0% adults with diabetes     CBG: Recent Labs  Lab 09/04/19 1141 09/04/19 1544 09/04/19 2027 09/04/19 2348 09/05/19 0353  GLUCAP 195* 143* 209* 178* 82     Critical care time: 30 minutes      Roselie Awkward, MD [place] [facility] Pager: [phone] Cell: [phone] If no response, call [phone]

## 2019-09-06 ENCOUNTER — Inpatient Hospital Stay: Payer: BC Managed Care – PPO

## 2019-09-06 ENCOUNTER — Inpatient Hospital Stay: Payer: BC Managed Care – PPO | Admitting: Hematology and Oncology

## 2019-09-06 DIAGNOSIS — N178 Other acute kidney failure: Secondary | ICD-10-CM | POA: Insufficient documentation

## 2019-09-06 DIAGNOSIS — R112 Nausea with vomiting, unspecified: Secondary | ICD-10-CM

## 2019-09-06 DIAGNOSIS — R197 Diarrhea, unspecified: Secondary | ICD-10-CM

## 2019-09-06 LAB — RENAL FUNCTION PANEL
Albumin: 2.4 g/dL — ABNORMAL LOW (ref 3.5–5.0)
Anion gap: 11 (ref 5–15)
BUN: 33 mg/dL — ABNORMAL HIGH (ref 8–23)
CO2: 23 mmol/L (ref 22–32)
Calcium: 8 mg/dL — ABNORMAL LOW (ref 8.9–10.3)
Chloride: 105 mmol/L (ref 98–111)
Creatinine, Ser: 6.35 mg/dL — ABNORMAL HIGH (ref 0.44–1.00)
GFR calc Af Amer: 7 mL/min — ABNORMAL LOW (ref >60)
GFR calc non Af Amer: 6 mL/min — ABNORMAL LOW (ref >60)
Glucose, Bld: 111 mg/dL — ABNORMAL HIGH (ref 70–99)
Phosphorus: 3.8 mg/dL (ref 2.5–4.6)
Potassium: 4 mmol/L (ref 3.5–5.1)
Sodium: 139 mmol/L (ref 135–145)

## 2019-09-06 LAB — GLUCOSE, CAPILLARY
Glucose-Capillary: 109 mg/dL — ABNORMAL HIGH (ref 70–99)
Glucose-Capillary: 115 mg/dL — ABNORMAL HIGH (ref 70–99)
Glucose-Capillary: 115 mg/dL — ABNORMAL HIGH (ref 70–99)
Glucose-Capillary: 132 mg/dL — ABNORMAL HIGH (ref 70–99)
Glucose-Capillary: 145 mg/dL — ABNORMAL HIGH (ref 70–99)
Glucose-Capillary: 153 mg/dL — ABNORMAL HIGH (ref 70–99)

## 2019-09-06 LAB — APTT: aPTT: 39 seconds — ABNORMAL HIGH (ref 24–36)

## 2019-09-06 LAB — HEPATITIS B SURFACE ANTIGEN: Hepatitis B Surface Ag: NONREACTIVE

## 2019-09-06 LAB — MAGNESIUM: Magnesium: 2.4 mg/dL (ref 1.7–2.4)

## 2019-09-06 MED ORDER — HEPARIN SODIUM (PORCINE) 1000 UNIT/ML IJ SOLN
INTRAMUSCULAR | Status: AC
  Administered 2019-09-06: 19:00:00 1000 [IU]
  Filled 2019-09-06: qty 4

## 2019-09-06 MED ORDER — INSULIN ASPART 100 UNIT/ML ~~LOC~~ SOLN
0.0000 [IU] | Freq: Three times a day (TID) | SUBCUTANEOUS | Status: DC
  Administered 2019-09-07: 1 [IU] via SUBCUTANEOUS

## 2019-09-06 MED ORDER — SODIUM CHLORIDE 0.9% FLUSH
10.0000 mL | INTRAVENOUS | Status: DC | PRN
  Administered 2019-09-09: 10 mL

## 2019-09-06 MED ORDER — CHLORHEXIDINE GLUCONATE CLOTH 2 % EX PADS: 6.0000 | MEDICATED_PAD | Freq: Every day | CUTANEOUS | Status: DC

## 2019-09-06 NOTE — Progress Notes (Signed)
Not an acute change. Pts HR and RR fluctuates.

## 2019-09-06 NOTE — Progress Notes (Signed)
Pt arrived back to the unit from dialysis @ 1934.Pt AAOx4. No signs of distress. VSS.

## 2019-09-06 NOTE — Progress Notes (Addendum)
PROGRESS NOTE                                                                                                                                                                                                             Patient Demographics:    Charlene Perry, is a 69 y.o. female, DOB - 10-11-1950, TGP:[id number]  Outpatient Primary MD for the patient is [name], MD   Admit date - 09/02/2019   LOS - 4  Chief Complaint  Patient presents with  . Emesis  . Diarrhea       Brief Narrative: Patient is a 69 y.o. female with PMHx of breast cancer-s/p mastectomy and then chemo/radiation in 3094, chronic diastolic heart failure, hypothyroidism, HTN, DM-2 presented to the ED with several days history of nausea, vomiting, diarrhea-she was found to have sepsis secondary to UTI along with AKI.  Hospital course complicated by worsening AKI requiring CRRT.  Transfer to [facility] on 3/22 for intermittent HD.  See below for further details.  Significant Events: 3/19>> CT renal stone study>> mild left hydronephrosis, bladder wall thickening with vesicular stranding 3/20>> admit to [facility] for severe sepsis and AKI. 3/22>> transferred to [facility]  Antibiotics: Rocephin: 3/19>>  Microbiology data: Blood cultures: 3/19>> no growth Urine culture: 3/19>> diphtheroids  Procedures: 3/20 R IJ HD cath > accidentally pulled out 3/21 R Femoral line CVL > accidentally pulled out  Consults: PCCM Nephrology    Subjective:    Charlene Perry today denies any shortness of breath or chest pain.  She was lying comfortably in bed.   Assessment  & Plan :   Acute renal failure: Suspect ATN due to volume depletion/ARB/Lasix.  CRRT started at [facility] following with plans for intermittent HD.  Await further recommendations from nephrology.  Severe sepsis from complicated UTI/pyelonephritis: Continue Rocephin-unfortunately urine  cultures growing diphtheroids which is likely a contamination.  We will plan on at least 7-10 days of antimicrobial therapy given severity of illness on admission.  HTN: Controlled-continue Coreg.  Losartan on hold due to AKI.  DM-2 (A1c 7.4 on 09/03/2019): CBG stable-continue SSI.  Metformin on hold due to AKI.  Recent Labs    09/06/19 0453 09/06/19 0750 09/06/19 1130  GLUCAP 132* 109* 145*   Hypothyroidism: Continue Synthroid  HLD: Resume statins when closer to discharge.  Recent history of COVID-19 infection (positive  in Jan): No indication for isolation or treatment.  History of breast cancer s/p mastectomy-chemo/radiation in 2020-continue letrozole  9 mm solid pleural based right middle lobe: seen incidentally on CT renal stone study-needs outpatient workup given hx of cancer  Obesity: Estimated body mass index is 37.82 kg/m as calculated from the following:   Height as of this encounter: 5\' 6"  (1.676 m).   Weight as of this encounter: 106.3 kg.    ABG: No results found for: PHART, PCO2ART, PO2ART, HCO3, TCO2, ACIDBASEDEF, O2SAT  Vent Settings: N/A    Condition - Guarded  Family Communication  :  Spouse updated over the phone  Code Status :  Full Code  Diet :  Diet Order            Diet renal/carb modified with fluid restriction Diet-HS Snack? Nothing; Fluid restriction: 1200 mL Fluid; Room service appropriate? Yes; Fluid consistency: Thin  Diet effective now               Disposition Plan  :  Remain hospitalized-probably  Barriers to discharge: AKI requiring renal replacement therapy  Antimicorbials  :    Anti-infectives (From admission, onward)   Start     Dose/Rate Route Frequency Ordered Stop   09/03/19 2030  cefTRIAXone (ROCEPHIN) 1 g in sodium chloride 0.9 % 100 mL IVPB     1 g 200 mL/hr over 30 Minutes Intravenous Every 24 hours 09/03/19 0057     09/02/19 2030  cefTRIAXone (ROCEPHIN) 1 g in sodium chloride 0.9 % 100 mL IVPB     1 g 200 mL/hr  over 30 Minutes Intravenous  Once 09/02/19 2022 09/02/19 2153      Inpatient Medications  Scheduled Meds: . carvedilol  12.5 mg Oral BID WC  . Chlorhexidine Gluconate Cloth  6 each Topical Daily  . heparin  5,000 Units Subcutaneous Q8H  . insulin aspart  2-6 Units Subcutaneous Q4H  . letrozole  2.5 mg Oral Daily  . levothyroxine  125 mcg Oral QAC breakfast  . sodium chloride flush  3 mL Intravenous Q12H   Continuous Infusions: . sodium chloride 40 mL/hr at 09/05/19 1600  . cefTRIAXone (ROCEPHIN)  IV 1 g (09/05/19 2123)   PRN Meds:.acetaminophen, alteplase, heparin, heparin, ondansetron (ZOFRAN) IV, sodium chloride, sodium chloride flush   Time Spent in minutes 35  See all Orders from today for further details   Oren Binet M.D on 09/06/2019 at 11:56 AM  To page go to [email or web address] - use universal password  [facility] -  Office  [phone]    Objective:   Vitals:   09/06/19 0500 09/06/19 0507 09/06/19 0752 09/06/19 1133  BP:  131/82 (!) 142/77 (!) 141/64  Pulse:  96 99 87  Resp:  16 20 12   Temp:  98.9 F (37.2 C) 98.4 F (36.9 C) (!) 97.4 F (36.3 C)  TempSrc:  Oral Axillary Oral  SpO2:  93% 95% 96%  Weight: 106.3 kg     Height:        Wt Readings from Last 3 Encounters:  09/06/19 106.3 kg  03/07/19 102.4 kg  12/30/18 101.2 kg     Intake/Output Summary (Last 24 hours) at 09/06/2019 1156 Last data filed at 09/05/2019 2125 Gross per 24 hour  Intake 580.56 ml  Output 2 ml  Net 578.56 ml     Physical Exam Gen Exam:Alert awake-not in any distress HEENT:atraumatic, normocephalic Chest: B/L clear to auscultation anteriorly CVS:S1S2 regular Abdomen:soft non tender, non distended Extremities:no edema Neurology:  Non focal Skin: no rash   Data Review:    CBC Recent Labs  Lab 09/02/19 1758 09/03/19 0430  WBC 8.2 9.0  HGB 11.4* 10.2*  HCT 35.9* 32.4*  PLT 253 241  MCV 91.3 91.3  MCH 29.0 28.7  MCHC 31.8 31.5  RDW 14.3 14.2    LYMPHSABS  --  0.8  MONOABS  --  1.0  EOSABS  --  0.0  BASOSABS  --  0.0    Chemistries  Recent Labs  Lab 09/02/19 1758 09/02/19 1758 09/03/19 0430 09/03/19 1151 09/03/19 1548 09/04/19 0508 09/04/19 0810 09/04/19 1600 09/05/19 0546 09/06/19 0858  NA 136   < > 136   < > 138 136  --  136 136 139  K 5.3*   < > 5.7*   < > 5.2* 4.5  --  4.5 4.1 4.0  CL 97*   < > 98   < > 97* 100  --  98 102 105  CO2 18*   < > 17*   < > 19* 25  --  25 25 23   GLUCOSE 97   < > 71   < > 76 126*  --  176* 102* 111*  BUN 67*   < > 68*   < > 83* 60*  --  58* 38* 33*  CREATININE 6.80*   < > 7.13*   < > 7.69* 5.34*  --  6.29* 4.67* 6.35*  CALCIUM 9.3   < > 8.4*   < > 8.2* 7.7*  --  8.1* 7.8* 8.0*  MG  --   --   --   --   --   --  2.1  --  2.3 2.4  AST 22  --  25  --   --   --   --   --   --   --   ALT 16  --  18  --   --   --   --   --   --   --   ALKPHOS 73  --  66  --   --   --   --   --   --   --   BILITOT 0.3  --  0.6  --   --   --   --   --   --   --    < > = values in this interval not displayed.   ------------------------------------------------------------------------------------------------------------------ No results for input(s): CHOL, HDL, LDLCALC, TRIG, CHOLHDL, LDLDIRECT in the last 72 hours.  Lab Results  Component Value Date   HGBA1C 7.4 (H) 09/03/2019   ------------------------------------------------------------------------------------------------------------------ No results for input(s): TSH, T4TOTAL, T3FREE, THYROIDAB in the last 72 hours.  Invalid input(s): FREET3 ------------------------------------------------------------------------------------------------------------------ No results for input(s): VITAMINB12, FOLATE, FERRITIN, TIBC, IRON, RETICCTPCT in the last 72 hours.  Coagulation profile Recent Labs  Lab 09/02/19 2310  INR 1.2    No results for input(s): DDIMER in the last 72 hours.  Cardiac Enzymes No results for input(s): CKMB, TROPONINI, MYOGLOBIN in the  last 168 hours.  Invalid input(s): CK ------------------------------------------------------------------------------------------------------------------ No results found for: BNP  Micro Results Recent Results (from the past 240 hour(s))  Urine culture     Status: Abnormal   Collection Time: 09/02/19  8:04 PM   Specimen: Urine, Random  Result Value Ref Range Status   Specimen Description URINE, RANDOM  Final   Special Requests   Final    NONE Performed at [facility]  [address] [place], [place] [address]    Culture (A)  Final    >=100,000 COLONIES/mL DIPHTHEROIDS(CORYNEBACTERIUM SPECIES) Standardized susceptibility testing for this organism is not available.    Report Status 09/04/2019 FINAL  Final  Culture, blood (routine x 2)     Status: None (Preliminary result)   Collection Time: 09/02/19  8:09 PM   Specimen: BLOOD  Result Value Ref Range Status   Specimen Description   Final    BLOOD LEFT ANTECUBITAL Performed at [facility], [place] [address], [place], [place] [address]    Special Requests   Final    BOTTLES DRAWN AEROBIC AND ANAEROBIC Blood Culture adequate volume Performed at [facility] [address] [place], [place] [address]    Culture NO GROWTH 4 DAYS  Final   Report Status PENDING  Incomplete  SARS CORONAVIRUS 2 (TAT 6-24 HRS) Nasopharyngeal Nasopharyngeal Swab     Status: Abnormal   Collection Time: 09/02/19 11:10 PM   Specimen: Nasopharyngeal Swab  Result Value Ref Range Status   SARS Coronavirus 2 POSITIVE (A) NEGATIVE Final    Comment: RESULT CALLED TO, READ BACK BY AND VERIFIED WITH: RN J TAOKINGTON @0640  09/03/19 BY S GEZAHEGN (NOTE) SARS-CoV-2 target nucleic acids are DETECTED. The SARS-CoV-2 RNA is generally detectable in upper and lower respiratory specimens during the acute phase of infection. Positive results are indicative of the presence of SARS-CoV-2 RNA. Clinical correlation with patient  history and other diagnostic information is  necessary to determine patient infection status. Positive results do not rule out bacterial infection or co-infection with other viruses.  The expected result is Negative. Fact Sheet for Patients: SugarRoll.be Fact Sheet for Healthcare Providers: https://www.woods-mathews.com/ This test is not yet approved or cleared by the [place] FDA and  has been authorized for detection and/or diagnosis of SARS-CoV-2 by FDA under an Emergency Use Authorization (EUA). This EUA will remain  in effect (meaning this test can be used)  for the duration of the COVID-19 declaration under Section 564(b)(1) of the Act, 21 U.S.C. section 360bbb-3(b)(1), unless the authorization is terminated or revoked sooner. Performed at [facility], [place] [address] [place], [place] [address]   MRSA PCR Screening     Status: None   Collection Time: 09/03/19  1:37 PM   Specimen: Nasal Mucosa; Nasopharyngeal  Result Value Ref Range Status   MRSA by PCR NEGATIVE NEGATIVE Final    Comment:        The GeneXpert MRSA Assay (FDA approved for NASAL specimens only), is one component of a comprehensive MRSA colonization surveillance program. It is not intended to diagnose MRSA infection nor to guide or monitor treatment for MRSA infections. Performed at [facility], [place] [address], [place], [place] [address]     Radiology Reports DG Bone Density  Result Date: 08/25/2019 EXAM: DUAL X-RAY ABSORPTIOMETRY (DXA) FOR BONE MINERAL DENSITY IMPRESSION: Referring Physician:  Gardenia Phlegm Your patient completed a BMD test using Lunar IDXA DXA system ( analysis version: 16 ) manufactured by [employer]. Technologist: AW PATIENT: Name: Basma, Buchner Patient ID: [id number] Birth Date: 01-14-1951 Height: 66.0 in. Sex: Female Measured: 08/25/2019 Weight: 224.6 lbs. Indications: Breast Cancer History,  Diabetic non insulin, Estrogen Deficient, Gabapentin, Hypothyroid, Hysterectomy, Letrozole, Levothyroxine Fractures: None Treatments: None ASSESSMENT: The BMD measured at Femur Neck Left is 0.987 g/cm2 with a T-score of -0.4. This patient is considered normal according to [facility]) criteria. The scan quality is good.  L-3 and L-4 were excluded due to degenerative changes. Site Region Measured Date Measured Age [age]A BMD Significant CHANGE T-score DualFemur Neck Left  10/25/[age]    66.8         -0.4    0.987 g/cm2 AP Spine  L1-L2      10/25[age]2019    66.8         0.8     1.267 g/cm2 DualFemur Total Mean 10/25/2019    66.8         0.2     1.032 g/cm2 World Health Organization [facility]) criteria for post-menopausal, Caucasian Women: Normal       T-score at or above -1 SD Osteopenia   T-score between -1 and -2.5 SD Osteoporosis T-score at or below -2.5 SD RECOMMENDATION: 1. All patients should optimize calcium and vitamin D intake. 2. Consider FDA approved medical therapies in postmenopausal women and men aged 24 years and older, based on the following: a. A hip or vertebral (clinical or morphometric) fracture b. T- score < or = -2.5 at the femoral neck or spine after appropriate evaluation to exclude secondary causes c. Low bone mass (T-score between -1.0 and -2.5 at the femoral neck or spine) and a 10 year probability of a hip fracture > or = 3% or a 10 year probability of a major osteoporosis-related fracture > or = 20% based on the [place]-adapted WHO algorithm d. Clinician judgment and/or patient preferences may indicate treatment for people with 10-year fracture probabilities above or below these levels FOLLOW-UP: Patients with diagnosis of osteoporosis or at high risk for fracture should have regular bone mineral density tests. For patients eligible for Medicare, routine testing is allowed once every 2 years. The testing frequency can be increased to one year for patients who have rapidly progressing  disease, those who are receiving or discontinuing medical therapy to restore bone mass, or have additional risk factors. I have reviewed this report and agree with the above findings. [facility] Radiology Electronically Signed   By: Lowella Grip III M.D.   On: 08/25/2019 16:05   DG CHEST PORT 1 VIEW  Result Date: 09/03/2019 CLINICAL DATA:  Dialysis catheter insertion. EXAM: PORTABLE CHEST 1 VIEW COMPARISON:  September 03, 2019. FINDINGS: The heart size and mediastinal contours are within normal limits. No pneumothorax or pleural effusion is noted. Left lung is clear. Mildly improved right lung opacities are noted. Left internal jugular Port-A-Cath is unchanged in position. Interval placement of right internal jugular catheter with distal tip in expected position of the SVC. The visualized skeletal structures are unremarkable. IMPRESSION: Interval placement of right internal jugular catheter with distal tip in expected position of the SVC. No pneumothorax is noted. Mildly improved right lung opacities are noted. Electronically Signed   By: Marijo Conception M.D.   On: 09/03/2019 15:43   DG CHEST PORT 1 VIEW  Result Date: 09/03/2019 CLINICAL DATA:  Shortness of breath. EXAM: PORTABLE CHEST 1 VIEW COMPARISON:  August 19, 2018. FINDINGS: The heart size and mediastinal contours are within normal limits. No pneumothorax or pleural effusion is noted. Left lung is clear. Left internal jugular Port-A-Cath is unchanged in position. Interval development of right upper and lower lobe airspace opacities are noted concerning for possible pneumonia. The visualized skeletal structures are unremarkable. IMPRESSION: Interval development of right upper and lower lobe airspace opacities concerning for possible pneumonia. Follow-up radiographs are recommended to ensure resolution. Electronically Signed   By: Marijo Conception M.D.   On: 09/03/2019 08:06  MM SCREENING BREAST TOMO UNI L  Result Date: 08/26/2019 CLINICAL DATA:   Screening. EXAM: DIGITAL SCREENING UNILATERAL LEFT MAMMOGRAM WITH CAD AND TOMO COMPARISON:  Previous exam(s). ACR Breast Density Category c: The breast tissue is heterogeneously dense, which may obscure small masses. FINDINGS: There are no findings suspicious for malignancy. Images were processed with CAD. IMPRESSION: No mammographic evidence of malignancy. A result letter of this screening mammogram will be mailed directly to the patient. RECOMMENDATION: Screening mammogram in one year. (Code:SM-B-01Y) BI-RADS CATEGORY  1: Negative. Electronically Signed   By: Franki Cabot M.D.   On: 08/26/2019 12:36   CT Renal Stone Study  Result Date: 09/02/2019 CLINICAL DATA:  Flank pain. EXAM: CT ABDOMEN AND PELVIS WITHOUT CONTRAST TECHNIQUE: Multidetector CT imaging of the abdomen and pelvis was performed following the standard protocol without IV contrast. COMPARISON:  March 15, 2018 FINDINGS: Lower chest: A focal 9 mm solid pleural based nodularity in the right middle lobe on axial series 2, image 7 new since September 2019 with adjacent streaky consolidation in the right middle lobe, possibly aspiration related. Follow-up imaging is indicated to exclude an underlying metastatic nodule given postoperative changes from right mastectomy. Normal heart size. Mild mitral valvular calcification. Hepatobiliary: Cholecystectomy. Mild hepatomegaly. Benign hepatic cysts, similarly sized; no further imaging characterization recommended. Chronic 10 mm dilatation of the proximal common bile duct with normal distal tapering and no intrahepatic biliary duct dilatation. Pancreas: Normal. Spleen: Normal. Adrenals/Urinary Tract: Stable 1.8 cm benign adrenal adenoma measuring 10 Hounsfield units. Normal right adrenal gland. Bilateral moderate perinephric inflammatory changes, slightly increased. Nonobstructing right renal punctate calculus. Mild left hydronephrosis without an obstructing renal or ureteral calculus. Decompressed  bilateral ureters. Diffusely thickened urinary bladder with perivesicular stranding. Stomach/Bowel: Mild nonspecific bowel wall thickening of the duodenum and left upper abdominal quadrant jejunum without perforation or free fluid. Mild adjacent inflammatory change. Reactive appearing lymph nodes in the associated mesenteric root. Nonobstructed. No colonic or gastric mucosal thickening. Small hiatal hernia. Normal appendix, axial series 2, image 67. Mild diverticulosis coli in the sigmoid and proximal transverse colon. Vascular/Lymphatic: Aortic calcified atherosclerosis. No abdominal aortic aneurysm. No adenopathy. Reproductive: Atrophic or surgically absent uterus. No adnexal cyst or mass. Other: Midline infraumbilical hernia containing non-strangulated, non incarcerated and nonobstructed small bowel loops. Small uncomplicated periumbilical herniation of fat. Musculoskeletal: Moderate degenerative change. Probable benign bone islands in the left hip. A sternal foramen. I discussed critical results and additional imaging recommendations by telephone at the time of interpretation on 09/02/2019 at 9:30 p.m. with provider Davonna Belling , who verbally acknowledged these results. IMPRESSION: Mild left hydronephrosis and diffuse circumferential wall thickening of the urinary bladder with mild perivesicular stranding. Findings support a clinical diagnosis of an acute cystitis with upper urinary tract infection. Bilateral moderate perinephric stranding could represent infection or medical renal disease. No obstructing renal or ureteral calculus. Punctate nonobstructing right renal calculus. A 9 mm solid pleural based right middle lobe pulmonary nodularity with adjacent streaky consolidation, which could be aspiration-related. Outpatient unenhanced CT chest recommended with surveillance CT Chest in 3 months should this nodularity persist. A metastatic or malignant pulmonary nodule are differential considerations,  particularly given the history of right mastectomy. Mild enteritis, consistent with the patient history of nausea and vomiting. Hepatomegaly and stable benign hepatic cysts for which no further imaging characterization or surveillance is recommended. Uncomplicated infraumbilical abdominal wall herniation of small bowel loops without strangulation, incarceration or obstruction. Stable size of a small left adrenal benign adenoma. Electronically Signed  ByRevonda Humphrey   On: 09/02/2019 21:31

## 2019-09-06 NOTE — Progress Notes (Signed)
Spouse at bedside during shift change and visiting hours. Spouse left and son came for the last 15 minutes. No call made for an update.

## 2019-09-06 NOTE — Progress Notes (Addendum)
[facility] NEPHROLOGY PROGRESS NOTE  Assessment/ Plan: Pt is a 69 y.o. yo female with history of hypertension, DM, CHF, breast cancer status post mastectomy and then chemo/radiation therapy in 2020 was admitted at [facility] hospital for sepsis due to UTI, nausea vomiting associated with AKI requiring CRRT.  Patient was transferred to [facility] for intermittent hemodialysis.  #Acute kidney injury, oligo-anuric: Suspected ATN due to severe volume depletion due to GI loss/ARB and diuretics use.  Normal creatinine at baseline. Patient was uremic therefore CRRT started from 3/20-3/22 and then transferred to [facility] for IHD.  Uremia improved.  -Worsening serum creatinine level to 6.35 today and only has 70 cc of urine output in 24 hours.  Plan for HD today via femoral catheter.   -I will consult IR for the placement of tunnel HD catheter.  No sign of renal recovery so far therefore she will likely need outpatient HD arrangement for AKI.  Discussed with the renal navigator as well as with the patient and her husband. -UA likely contaminant/UTI, check PTH level.  CT scan with mild left hydronephrosis without obstructing calculi.  #Severe sepsis due to UTI/pyelonephritis: Continue ceftriaxone.  Per primary team.  #Hypertension/volume: Blood pressure acceptable.  UF as tolerated during HD.  Avoid hypotensive episode.  Holding ARB.  #Anemia of acute illness: Monitor hemoglobin.  Check iron studies.  Subjective: Patient was seen and examined at bedside.  Sitting on chair comfortable, urine output of only 70 cc in last 24 hours.  She denied headache, dizziness, nausea, vomiting, chest pain or shortness of breath.  Her husband at bedside. Objective Vital signs in last 24 hours: Vitals:   09/06/19 0500 09/06/19 0507 09/06/19 0752 09/06/19 1133  BP:  131/82 (!) 142/77 (!) 141/64  Pulse:  96 99 87  Resp:  16 20 12   Temp:  98.9 F (37.2 C) 98.4 F (36.9 C) (!) 97.4 F (36.3 C)  TempSrc:  Oral Axillary Oral   SpO2:  93% 95% 96%  Weight: 106.3 kg     Height:       Weight change: 2.2 kg  Intake/Output Summary (Last 24 hours) at 09/06/2019 1451 Last data filed at 09/06/2019 1423 Gross per 24 hour  Intake 523.06 ml  Output -13 ml  Net 536.06 ml       Labs: Basic Metabolic Panel: Recent Labs  Lab 09/04/19 1600 09/05/19 0546 09/06/19 0858  NA 136 136 139  K 4.5 4.1 4.0  CL 98 102 105  CO2 25 25 23   GLUCOSE 176* 102* 111*  BUN 58* 38* 33*  CREATININE 6.29* 4.67* 6.35*  CALCIUM 8.1* 7.8* 8.0*  PHOS 5.2* 3.6 3.8   Liver Function Tests: Recent Labs  Lab 09/02/19 1758 09/02/19 1758 09/03/19 0430 09/03/19 1548 09/04/19 1600 09/05/19 0546 09/06/19 0858  AST 22  --  25  --   --   --   --   ALT 16  --  18  --   --   --   --   ALKPHOS 73  --  66  --   --   --   --   BILITOT 0.3  --  0.6  --   --   --   --   PROT 7.8  --  6.0*  --   --   --   --   ALBUMIN 3.6   < > 2.7*   < > 3.0* 2.6* 2.4*   < > = values in this interval not displayed.  Recent Labs  Lab 09/02/19 1758  LIPASE 79*   No results for input(s): AMMONIA in the last 168 hours. CBC: Recent Labs  Lab 09/02/19 1758 09/03/19 0430  WBC 8.2 9.0  NEUTROABS  --  7.0  HGB 11.4* 10.2*  HCT 35.9* 32.4*  MCV 91.3 91.3  PLT 253 241   Cardiac Enzymes: No results for input(s): CKTOTAL, CKMB, CKMBINDEX, TROPONINI in the last 168 hours. CBG: Recent Labs  Lab 09/05/19 2048 09/06/19 0005 09/06/19 0453 09/06/19 0750 09/06/19 1130  GLUCAP 195* 115* 132* 109* 145*    Iron Studies: No results for input(s): IRON, TIBC, TRANSFERRIN, FERRITIN in the last 72 hours. Studies/Results: No results found.  Medications: Infusions: . sodium chloride 40 mL/hr at 09/05/19 1600  . cefTRIAXone (ROCEPHIN)  IV 1 g (09/05/19 2123)    Scheduled Medications: . carvedilol  12.5 mg Oral BID WC  . Chlorhexidine Gluconate Cloth  6 each Topical Daily  . heparin  5,000 Units Subcutaneous Q8H  . insulin aspart  0-9 Units  Subcutaneous TID WC  . letrozole  2.5 mg Oral Daily  . levothyroxine  125 mcg Oral QAC breakfast  . sodium chloride flush  3 mL Intravenous Q12H    have reviewed scheduled and prn medications.  Physical Exam: General:NAD, comfortable Heart:RRR, s1s2 nl Lungs:clear b/l, no crackle Abdomen:soft, Non-tender, non-distended Extremities: Trace LE edema Dialysis Access: Right femoral vein temporary HD catheter was placed on 3/21.  Isabella Roemmich Tanna Furry 09/06/2019,2:51 PM  LOS: 4 days  Pager: [phone]

## 2019-09-06 NOTE — Progress Notes (Signed)
Inpatient Diabetes Program Recommendations  AACE/ADA: New Consensus Statement on Inpatient Glycemic Control (2015)  Target Ranges:  Prepandial:   less than 140 mg/dL      Peak postprandial:   less than 180 mg/dL (1-2 hours)      Critically ill patients:  140 - 180 mg/dL   Lab Results  Component Value Date   GLUCAP 109 (H) 09/06/2019   HGBA1C 7.4 (H) 09/03/2019    Review of Glycemic Control Results for Charlene Perry, Charlene Perry (MRN [id number]) as of 09/06/2019 10:33  Ref. Range 09/05/2019 08:09 09/05/2019 12:08 09/05/2019 17:58 09/05/2019 20:48 09/06/2019 00:05 09/06/2019 04:53 09/06/2019 07:50  Glucose-Capillary Latest Ref Range: 70 - 99 mg/dL 102 (H) 132 (H) 202 (H) 195 (H) 115 (H) 132 (H) 109 (H)   Current orders for Inpatient glycemic control:  ICU order set Novolog 2-6 units Q4 hours  Inpatient Diabetes Program Recommendations:    D/c ICU glycemic order set and order Novolog 0-15 units tid + hs (equivalent of current scale) since diet is ordered.  Thanks,  Tama Headings RN, MSN, BC-ADM Inpatient Diabetes Coordinator Team Pager [phone] (8a-5p)

## 2019-09-06 NOTE — Progress Notes (Signed)
PT Cancellation Note  Patient Details Name: Charlene Perry MRN: [id number] DOB: 1950-06-29   Cancelled Treatment:    Reason Eval/Treat Not Completed: (P) Patient at procedure or test/unavailable Pt is off floor for HD. PT will follow back for Evaluation tomorrow.   Candis Kabel B. Migdalia Dk PT, DPT Acute Rehabilitation Services Pager [phone] Office [phone]    [place] 09/06/2019, 3:07 PM

## 2019-09-06 NOTE — Progress Notes (Signed)
Renal Navigator spoke with Dr. Bhandari/Nephrologist who requests referral be made for patient for OP HD treatment for AKI. Renal Navigator met with patient at HD bedside, sensitive that this is her first iHD treatment. She acknowledged that she has been on CRRT prior to today. Patient was alert, pleasant and welcoming of Navigator's visit. She was agreeable to talking about OP HD treatment referral, though states she is hopeful for recovery. She reports that she did not have a Nephrologist following her prior to this hospitalization and that although she lives in [place], [place], she had come to [place] when she felt poorly because of her cancer treatment there. We discussed that there are 2 OP HD clinics in [place], [place] and that these are most likely the closest centers to her home. She paused for a long time and then stated that they would probably be fine, but seemed to hesitate. She then asked if they were in "our system." Navigator explained that the Nephrologists caring for patient currently are part of [facility] and the closest clinic that they would go to is [facility] in [place]. Navigator also mentioned that there is a [facility] in [place], [place]. Navigator told patient that no matter what clinic she is a patient at, she can come to [facility] is she has a medical emergency, as we treat patients from all over. Navigator informed her that OP HD treatments are 3 times per week, so there is a benefit to being close to home. She reports that driving has been her husband's "therapy" in the past and that she does not think he would have a problem driving her anywhere she decides she wants to go. She would like to contact her insurance company and see if they will cover OP HD in [place] or [place]. Navigator encouraged her to specifically ask about coverage for AKI when she calls and stated that Navigator can follow up with her tomorrow. Patient very appreciative.  Patient reports that she is  eager to go home, but understands she is not ready yet. She states her husband, sister and son are great supports and that she is most eager to get out to see her 67 year old granddaughter.   Shaw, Colleen Elizabeth, LCSW Renal Navigator [phone] 

## 2019-09-06 NOTE — Progress Notes (Signed)
Patient not placed on CPAP at this time due to patient being non-compliant with CPAP/COVID positive

## 2019-09-07 ENCOUNTER — Encounter (HOSPITAL_COMMUNITY): Payer: Self-pay | Admitting: Family Medicine

## 2019-09-07 ENCOUNTER — Inpatient Hospital Stay (HOSPITAL_COMMUNITY): Payer: BC Managed Care – PPO

## 2019-09-07 HISTORY — PX: IR US GUIDE VASC ACCESS RIGHT: IMG2390

## 2019-09-07 HISTORY — PX: IR FLUORO GUIDE CV LINE RIGHT: IMG2283

## 2019-09-07 LAB — RENAL FUNCTION PANEL
Albumin: 2.4 g/dL — ABNORMAL LOW (ref 3.5–5.0)
Anion gap: 7 (ref 5–15)
BUN: 15 mg/dL (ref 8–23)
CO2: 27 mmol/L (ref 22–32)
Calcium: 7.8 mg/dL — ABNORMAL LOW (ref 8.9–10.3)
Chloride: 104 mmol/L (ref 98–111)
Creatinine, Ser: 5.12 mg/dL — ABNORMAL HIGH (ref 0.44–1.00)
GFR calc Af Amer: 9 mL/min — ABNORMAL LOW (ref >60)
GFR calc non Af Amer: 8 mL/min — ABNORMAL LOW (ref >60)
Glucose, Bld: 119 mg/dL — ABNORMAL HIGH (ref 70–99)
Phosphorus: 3.4 mg/dL (ref 2.5–4.6)
Potassium: 3.6 mmol/L (ref 3.5–5.1)
Sodium: 138 mmol/L (ref 135–145)

## 2019-09-07 LAB — CBC
HCT: 24.6 % — ABNORMAL LOW (ref 36.0–46.0)
Hemoglobin: 8 g/dL — ABNORMAL LOW (ref 12.0–15.0)
MCH: 28.4 pg (ref 26.0–34.0)
MCHC: 32.5 g/dL (ref 30.0–36.0)
MCV: 87.2 fL (ref 80.0–100.0)
Platelets: 191 10*3/uL (ref 150–400)
RBC: 2.82 MIL/uL — ABNORMAL LOW (ref 3.87–5.11)
RDW: 14.3 % (ref 11.5–15.5)
WBC: 5.1 10*3/uL (ref 4.0–10.5)
nRBC: 0 % (ref 0.0–0.2)

## 2019-09-07 LAB — CULTURE, BLOOD (ROUTINE X 2)
Culture: NO GROWTH
Special Requests: ADEQUATE

## 2019-09-07 LAB — GLUCOSE, CAPILLARY
Glucose-Capillary: 115 mg/dL — ABNORMAL HIGH (ref 70–99)
Glucose-Capillary: 105 mg/dL — ABNORMAL HIGH (ref 70–99)
Glucose-Capillary: 110 mg/dL — ABNORMAL HIGH (ref 70–99)
Glucose-Capillary: 138 mg/dL — ABNORMAL HIGH (ref 70–99)
Glucose-Capillary: 128 mg/dL — ABNORMAL HIGH (ref 70–99)

## 2019-09-07 LAB — FERRITIN: Ferritin: 64 ng/mL (ref 11–307)

## 2019-09-07 LAB — IRON AND TIBC
Iron: 44 ug/dL (ref 28–170)
Saturation Ratios: 22 % (ref 10.4–31.8)
TIBC: 202 ug/dL — ABNORMAL LOW (ref 250–450)
UIBC: 158 ug/dL

## 2019-09-07 MED ORDER — MIDAZOLAM HCL 2 MG/2ML IJ SOLN
INTRAMUSCULAR | Status: AC
  Filled 2019-09-07: qty 2

## 2019-09-07 MED ORDER — OXYCODONE-ACETAMINOPHEN 5-325 MG PO TABS
1.0000 | ORAL_TABLET | Freq: Four times a day (QID) | ORAL | Status: DC | PRN
  Administered 2019-09-07: 1 via ORAL
  Administered 2019-09-07: 2 via ORAL
  Administered 2019-09-08: 1 via ORAL
  Administered 2019-09-08: 2 via ORAL
  Filled 2019-09-07: qty 2
  Filled 2019-09-07: qty 1
  Filled 2019-09-07: qty 2
  Filled 2019-09-07: qty 1

## 2019-09-07 MED ORDER — FENTANYL CITRATE (PF) 100 MCG/2ML IJ SOLN
INTRAMUSCULAR | Status: AC
  Filled 2019-09-07: qty 2

## 2019-09-07 MED ORDER — OXYCODONE HCL 5 MG PO TABS
2.5000 mg | ORAL_TABLET | Freq: Four times a day (QID) | ORAL | Status: DC | PRN
  Administered 2019-09-07: 2.5 mg via ORAL
  Filled 2019-09-07: qty 1

## 2019-09-07 MED ORDER — HEPARIN SODIUM (PORCINE) 1000 UNIT/ML IJ SOLN
INTRAMUSCULAR | Status: AC
  Filled 2019-09-07: qty 1

## 2019-09-07 MED ORDER — FENTANYL CITRATE (PF) 100 MCG/2ML IJ SOLN
INTRAMUSCULAR | Status: AC | PRN
  Administered 2019-09-07: 50 ug via INTRAVENOUS
  Administered 2019-09-07: 25 ug via INTRAVENOUS

## 2019-09-07 MED ORDER — LIDOCAINE HCL 1 % IJ SOLN
INTRAMUSCULAR | Status: AC
  Filled 2019-09-07: qty 20

## 2019-09-07 MED ORDER — MIDAZOLAM HCL 2 MG/2ML IJ SOLN
INTRAMUSCULAR | Status: AC | PRN
  Administered 2019-09-07: 1 mg via INTRAVENOUS
  Administered 2019-09-07: 0.5 mg via INTRAVENOUS

## 2019-09-07 NOTE — Progress Notes (Addendum)
PROGRESS NOTE                                                                                                                                                                                                             Patient Demographics:    Charlene Perry, is a 69 y.o. female, DOB - 1950/07/04, VQM:[id number]  Outpatient Primary MD for the patient is [name], MD   Admit date - 09/02/2019   LOS - 5  Chief Complaint  Patient presents with  . Emesis  . Diarrhea       Brief Narrative: Patient is a 69 y.o. female with PMHx of breast cancer-s/p mastectomy and then chemo/radiation in 9326, chronic diastolic heart failure, hypothyroidism, HTN, DM-2 presented to the ED with several days history of nausea, vomiting, diarrhea-she was found to have sepsis secondary to UTI along with AKI.  Hospital course complicated by worsening AKI requiring CRRT.  Transfer to [facility] on 3/22 for intermittent HD.  See below for further details.  Significant Events: 3/19>> CT renal stone study>> mild left hydronephrosis, bladder wall thickening with vesicular stranding 3/20>> admit to [facility] for severe sepsis and AKI. 3/22>> transferred to [facility]  Antibiotics: Rocephin: 3/19>>  Microbiology data: Blood cultures: 3/19>> no growth Urine culture: 3/19>> diphtheroids  Procedures: 3/20 R IJ HD cath > accidentally pulled out 3/21 R Femoral line CVL 3/24>> right IJ tunneled hemodialysis catheter placement by IR.  Consults: PCCM Nephrology    Subjective:   Complains of some pain in the right upper chest area where she had HD catheter placed earlier this morning.    Assessment  & Plan :   Acute renal failure: Suspect ATN due to volume depletion/ARB/Lasix.  CRRT started at [facility] transferred to [facility] for intermittent HD.  She remains anuric this morning-nephrology plans on HD tomorrow.    Severe sepsis from complicated  UTI/pyelonephritis: Continue Rocephin-unfortunately urine cultures growing diphtheroids which is likely a contamination.  We will plan on at least 7-10 days of antimicrobial therapy given severity of illness on admission.  Anemia: Secondary to acute illness-no evidence of blood loss-follow for now.  Transfuse if<7  HTN: Controlled-continue Coreg.  Losartan on hold due to AKI.  DM-2 (A1c 7.4 on 09/03/2019): CBG stable-continue SSI.  Metformin on hold due to AKI.  Recent Labs    09/07/19 0357 09/07/19 7124  09/07/19 1229  GLUCAP 115* 105* 110*   Hypothyroidism: Continue Synthroid  HLD: Resume statins when closer to discharge.  Recent history of COVID-19 infection (positive in Jan): No indication for isolation or treatment.  History of breast cancer s/p mastectomy-chemo/radiation in 2020-continue letrozole  9 mm solid pleural based right middle lobe: seen incidentally on CT renal stone study-needs outpatient workup given hx of cancer  Obesity: Estimated body mass index is 37.36 kg/m as calculated from the following:   Height as of this encounter: 5\' 6"  (1.676 m).   Weight as of this encounter: 105 kg.    ABG: No results found for: PHART, PCO2ART, PO2ART, HCO3, TCO2, ACIDBASEDEF, O2SAT  Vent Settings: N/A    Condition - Guarded  Family Communication  :  Spouse updated at bedside  Code Status :  Full Code  Diet :  Diet Order            Diet heart healthy/carb modified Room service appropriate? Yes; Fluid consistency: Thin  Diet effective now               Disposition Plan  :  Remain hospitalized-probably  Barriers to discharge: AKI requiring renal replacement therapy  Antimicorbials  :    Anti-infectives (From admission, onward)   Start     Dose/Rate Route Frequency Ordered Stop   09/03/19 2030  cefTRIAXone (ROCEPHIN) 1 g in sodium chloride 0.9 % 100 mL IVPB     1 g 200 mL/hr over 30 Minutes Intravenous Every 24 hours 09/03/19 0057 09/08/19 2359   09/02/19  2030  cefTRIAXone (ROCEPHIN) 1 g in sodium chloride 0.9 % 100 mL IVPB     1 g 200 mL/hr over 30 Minutes Intravenous  Once 09/02/19 2022 09/02/19 2153      Inpatient Medications  Scheduled Meds: . carvedilol  12.5 mg Oral BID WC  . Chlorhexidine Gluconate Cloth  6 each Topical Daily  . fentaNYL      . heparin      . heparin  5,000 Units Subcutaneous Q8H  . insulin aspart  0-9 Units Subcutaneous TID WC  . letrozole  2.5 mg Oral Daily  . levothyroxine  125 mcg Oral QAC breakfast  . lidocaine      . midazolam      . sodium chloride flush  3 mL Intravenous Q12H   Continuous Infusions: . sodium chloride 40 mL/hr at 09/07/19 0515  . cefTRIAXone (ROCEPHIN)  IV 1 g (09/06/19 2011)   PRN Meds:.acetaminophen, alteplase, ondansetron (ZOFRAN) IV, oxyCODONE, sodium chloride flush   Time Spent in minutes 35  See all Orders from today for further details   Oren Binet M.D on 09/07/2019 at 1:58 PM  To page go to [email or web address] - use universal password  [facility] -  Office  [phone]    Objective:   Vitals:   09/07/19 0920 09/07/19 0925 09/07/19 0930 09/07/19 0945  BP: (!) 165/86 (!) 164/69 (!) 156/81 (!) 148/80  Pulse: 94 93 91 90  Resp: 16 13 15 18   Temp:    98.7 F (37.1 C)  TempSrc:    Oral  SpO2: 99% 99% 96% 97%  Weight:      Height:        Wt Readings from Last 3 Encounters:  09/07/19 105 kg  03/07/19 102.4 kg  12/30/18 101.2 kg     Intake/Output Summary (Last 24 hours) at 09/07/2019 1358 Last data filed at 09/06/2019 2116 Gross per 24 hour  Intake 243 ml  Output  2000 ml  Net -1757 ml     Physical Exam Gen Exam:Alert awake-not in any distress HEENT:atraumatic, normocephalic Chest: B/L clear to auscultation anteriorly CVS:S1S2 regular Abdomen:soft non tender, non distended Extremities:TRACE edema Neurology: Non focal Skin: no rash   Data Review:    CBC Recent Labs  Lab 09/02/19 1758 09/03/19 0430 09/07/19 0502  WBC 8.2 9.0 5.1    HGB 11.4* 10.2* 8.0*  HCT 35.9* 32.4* 24.6*  PLT 253 241 191  MCV 91.3 91.3 87.2  MCH 29.0 28.7 28.4  MCHC 31.8 31.5 32.5  RDW 14.3 14.2 14.3  LYMPHSABS  --  0.8  --   MONOABS  --  1.0  --   EOSABS  --  0.0  --   BASOSABS  --  0.0  --     Chemistries  Recent Labs  Lab 09/02/19 1758 09/02/19 1758 09/03/19 0430 09/03/19 1151 09/04/19 0508 09/04/19 0810 09/04/19 1600 09/05/19 0546 09/06/19 0858 09/07/19 0502  NA 136   < > 136   < > 136  --  136 136 139 138  K 5.3*   < > 5.7*   < > 4.5  --  4.5 4.1 4.0 3.6  CL 97*   < > 98   < > 100  --  98 102 105 104  CO2 18*   < > 17*   < > 25  --  25 25 23 27   GLUCOSE 97   < > 71   < > 126*  --  176* 102* 111* 119*  BUN 67*   < > 68*   < > 60*  --  58* 38* 33* 15  CREATININE 6.80*   < > 7.13*   < > 5.34*  --  6.29* 4.67* 6.35* 5.12*  CALCIUM 9.3   < > 8.4*   < > 7.7*  --  8.1* 7.8* 8.0* 7.8*  MG  --   --   --   --   --  2.1  --  2.3 2.4  --   AST 22  --  25  --   --   --   --   --   --   --   ALT 16  --  18  --   --   --   --   --   --   --   ALKPHOS 73  --  66  --   --   --   --   --   --   --   BILITOT 0.3  --  0.6  --   --   --   --   --   --   --    < > = values in this interval not displayed.   ------------------------------------------------------------------------------------------------------------------ No results for input(s): CHOL, HDL, LDLCALC, TRIG, CHOLHDL, LDLDIRECT in the last 72 hours.  Lab Results  Component Value Date   HGBA1C 7.4 (H) 09/03/2019   ------------------------------------------------------------------------------------------------------------------ No results for input(s): TSH, T4TOTAL, T3FREE, THYROIDAB in the last 72 hours.  Invalid input(s): FREET3 ------------------------------------------------------------------------------------------------------------------ Recent Labs    09/07/19 0502  FERRITIN 64  TIBC 202*  IRON 44    Coagulation profile Recent Labs  Lab 09/02/19 2310  INR 1.2     No results for input(s): DDIMER in the last 72 hours.  Cardiac Enzymes No results for input(s): CKMB, TROPONINI, MYOGLOBIN in the last 168 hours.  Invalid input(s): CK ------------------------------------------------------------------------------------------------------------------ No results found for: BNP  Micro Results Recent  Results (from the past 240 hour(s))  Urine culture     Status: Abnormal   Collection Time: 09/02/19  8:04 PM   Specimen: Urine, Random  Result Value Ref Range Status   Specimen Description URINE, RANDOM  Final   Special Requests   Final    NONE Performed at [facility] [address], [place], [place] [address]    Culture (A)  Final    >=100,000 COLONIES/mL DIPHTHEROIDS(CORYNEBACTERIUM SPECIES) Standardized susceptibility testing for this organism is not available.    Report Status 09/04/2019 FINAL  Final  Culture, blood (routine x 2)     Status: None   Collection Time: 09/02/19  8:09 PM   Specimen: BLOOD  Result Value Ref Range Status   Specimen Description   Final    BLOOD LEFT ANTECUBITAL Performed at [facility], [place] [address] [place], [place] [address]    Special Requests   Final    BOTTLES DRAWN AEROBIC AND ANAEROBIC Blood Culture adequate volume Performed at [facility] [address] [place], [place] [address]    Culture   Final    NO GROWTH 5 DAYS Performed at [facility], [place] [address] [place], [place] [address]    Report Status 09/07/2019 FINAL  Final  SARS CORONAVIRUS 2 (TAT 6-24 HRS) Nasopharyngeal Nasopharyngeal Swab     Status: Abnormal   Collection Time: 09/02/19 11:10 PM   Specimen: Nasopharyngeal Swab  Result Value Ref Range Status   SARS Coronavirus 2 POSITIVE (A) NEGATIVE Final    Comment: RESULT CALLED TO, READ BACK BY AND VERIFIED WITH: RN J TAOKINGTON @0640  09/03/19 BY S GEZAHEGN (NOTE) SARS-CoV-2 target nucleic acids are DETECTED. The SARS-CoV-2 RNA is  generally detectable in upper and lower respiratory specimens during the acute phase of infection. Positive results are indicative of the presence of SARS-CoV-2 RNA. Clinical correlation with patient history and other diagnostic information is  necessary to determine patient infection status. Positive results do not rule out bacterial infection or co-infection with other viruses.  The expected result is Negative. Fact Sheet for Patients: SugarRoll.be Fact Sheet for Healthcare Providers: https://www.woods-mathews.com/ This test is not yet approved or cleared by the [place] FDA and  has been authorized for detection and/or diagnosis of SARS-CoV-2 by FDA under an Emergency Use Authorization (EUA). This EUA will remain  in effect (meaning this test can be used)  for the duration of the COVID-19 declaration under Section 564(b)(1) of the Act, 21 U.S.C. section 360bbb-3(b)(1), unless the authorization is terminated or revoked sooner. Performed at [facility], [place] [address] [place], [place] [address]   MRSA PCR Screening     Status: None   Collection Time: 09/03/19  1:37 PM   Specimen: Nasal Mucosa; Nasopharyngeal  Result Value Ref Range Status   MRSA by PCR NEGATIVE NEGATIVE Final    Comment:        The GeneXpert MRSA Assay (FDA approved for NASAL specimens only), is one component of a comprehensive MRSA colonization surveillance program. It is not intended to diagnose MRSA infection nor to guide or monitor treatment for MRSA infections. Performed at [facility], [place] [address], [place], [place] [address]     Radiology Reports IR Fluoro Guide CV Line Right  Result Date: 09/07/2019 CLINICAL DATA:  Renal failure. Femoral Trialysis catheter. Needs durable long-term venous access for hemodialysis EXAM: TUNNELED HEMODIALYSIS CATHETER PLACEMENT WITH ULTRASOUND AND FLUOROSCOPIC GUIDANCE TECHNIQUE: The procedure,  risks,  benefits, and alternatives were explained to the patient. Questions regarding the procedure were encouraged and answered. The patient understands and consents to the procedure. Patient was already receiving adequate prophylactic antibiotic coverage as an inpatient. Patency of the right IJ vein was confirmed with ultrasound with image documentation. An appropriate skin site was determined. Region was prepped using maximum barrier technique including cap and mask, sterile gown, sterile gloves, large sterile sheet, and Chlorhexidine as cutaneous antisepsis. The region was infiltrated locally with 1% lidocaine. Intravenous Fentanyl 41mcg and Versed 1.5mg  were administered as conscious sedation during continuous monitoring of the patient's level of consciousness and physiological / cardiorespiratory status by the radiology RN, with a total moderate sedation time of 16 minutes. Under real-time ultrasound guidance, the right IJ vein was accessed with a 21 gauge micropuncture needle; the needle tip within the vein was confirmed with ultrasound image documentation. Needle exchanged over the 018 guidewire for transitional dilator, which allowed advancement of a Benson wire into the IVC. Over this, an MPA catheter was advanced. A Palindrome 23 hemodialysis catheter was tunneled from the right anterior chest wall approach to the right IJ dermatotomy site. The MPA catheter was exchanged over an Amplatz wire for serial vascular dilators which allow placement of a peel-away sheath, through which the catheter was advanced under intermittent fluoroscopy, positioned with its tips in the proximal and midright atrium. Spot chest radiograph confirms good catheter position. No pneumothorax. Catheter was flushed and primed per protocol. Catheter secured externally with O Prolene sutures. The right IJ dermatotomy site was closed with Dermabond. COMPLICATIONS: COMPLICATIONS None immediate FLUOROSCOPY TIME:  30 seconds; 6 mGy  COMPARISON:  None IMPRESSION: 1. Technically successful placement of tunneled right IJ hemodialysis catheter with ultrasound and fluoroscopic guidance. Ready for routine use. ACCESS: Remains approachable for percutaneous intervention as needed. Electronically Signed   By: Lucrezia Europe M.D.   On: 09/07/2019 10:45   IR US Guide Vasc Access Right  Result Date: 09/07/2019 CLINICAL DATA:  Renal failure. Femoral Trialysis catheter. Needs durable long-term venous access for hemodialysis EXAM: TUNNELED HEMODIALYSIS CATHETER PLACEMENT WITH ULTRASOUND AND FLUOROSCOPIC GUIDANCE TECHNIQUE: The procedure, risks, benefits, and alternatives were explained to the patient. Questions regarding the procedure were encouraged and answered. The patient understands and consents to the procedure. Patient was already receiving adequate prophylactic antibiotic coverage as an inpatient. Patency of the right IJ vein was confirmed with ultrasound with image documentation. An appropriate skin site was determined. Region was prepped using maximum barrier technique including cap and mask, sterile gown, sterile gloves, large sterile sheet, and Chlorhexidine as cutaneous antisepsis. The region was infiltrated locally with 1% lidocaine. Intravenous Fentanyl 64mcg and Versed 1.5mg  were administered as conscious sedation during continuous monitoring of the patient's level of consciousness and physiological / cardiorespiratory status by the radiology RN, with a total moderate sedation time of 16 minutes. Under real-time ultrasound guidance, the right IJ vein was accessed with a 21 gauge micropuncture needle; the needle tip within the vein was confirmed with ultrasound image documentation. Needle exchanged over the 018 guidewire for transitional dilator, which allowed advancement of a Benson wire into the IVC. Over this, an MPA catheter was advanced. A Palindrome 23 hemodialysis catheter was tunneled from the right anterior chest wall approach to the  right IJ dermatotomy site. The MPA catheter was exchanged over an Amplatz wire for serial vascular dilators which allow placement of a peel-away sheath, through which the catheter was advanced under intermittent fluoroscopy, positioned with its tips in the proximal  and midright atrium. Spot chest radiograph confirms good catheter position. No pneumothorax. Catheter was flushed and primed per protocol. Catheter secured externally with O Prolene sutures. The right IJ dermatotomy site was closed with Dermabond. COMPLICATIONS: COMPLICATIONS None immediate FLUOROSCOPY TIME:  30 seconds; 6 mGy COMPARISON:  None IMPRESSION: 1. Technically successful placement of tunneled right IJ hemodialysis catheter with ultrasound and fluoroscopic guidance. Ready for routine use. ACCESS: Remains approachable for percutaneous intervention as needed. Electronically Signed   By: Lucrezia Europe M.D.   On: 09/07/2019 10:45   DG Bone Density  Result Date: 08/25/2019 EXAM: DUAL X-RAY ABSORPTIOMETRY (DXA) FOR BONE MINERAL DENSITY IMPRESSION: Referring Physician:  Gardenia Phlegm Your patient completed a BMD test using Lunar IDXA DXA system ( analysis version: 16 ) manufactured by [employer]. Technologist: AW PATIENT: Name: Anastazja, Isaac Patient ID: [id number] Birth Date: [birth year] Height: 66.0 in. Sex: Female Measured: 08/25/2019 Weight: 224.6 lbs. Indications: Breast Cancer History, Diabetic non insulin, Estrogen Deficient, Gabapentin, Hypothyroid, Hysterectomy, Letrozole, Levothyroxine Fractures: None Treatments: None ASSESSMENT: The BMD measured at Femur Neck Left is 0.987 g/cm2 with a T-score of -0.4. This patient is considered normal according to [facility]) criteria. The scan quality is good. L-3 and L-4 were excluded due to degenerative changes. Site Region Measured Date Measured Age [age]A BMD Significant CHANGE T-score DualFemur Neck Left  10/25/[age]    66.8         -0.4    0.987 g/cm2 AP Spine   L1-L2      10/25[age]2019    66.8         0.8     1.267 g/cm2 DualFemur Total Mean 10/25/2019    66.8         0.2     1.032 g/cm2 World Health Organization [facility]) criteria for post-menopausal, Caucasian Women: Normal       T-score at or above -1 SD Osteopenia   T-score between -1 and -2.5 SD Osteoporosis T-score at or below -2.5 SD RECOMMENDATION: 1. All patients should optimize calcium and vitamin D intake. 2. Consider FDA approved medical therapies in postmenopausal women and men aged 73 years and older, based on the following: a. A hip or vertebral (clinical or morphometric) fracture b. T- score < or = -2.5 at the femoral neck or spine after appropriate evaluation to exclude secondary causes c. Low bone mass (T-score between -1.0 and -2.5 at the femoral neck or spine) and a 10 year probability of a hip fracture > or = 3% or a 10 year probability of a major osteoporosis-related fracture > or = 20% based on the [place]-adapted WHO algorithm d. Clinician judgment and/or patient preferences may indicate treatment for people with 10-year fracture probabilities above or below these levels FOLLOW-UP: Patients with diagnosis of osteoporosis or at high risk for fracture should have regular bone mineral density tests. For patients eligible for Medicare, routine testing is allowed once every 2 years. The testing frequency can be increased to one year for patients who have rapidly progressing disease, those who are receiving or discontinuing medical therapy to restore bone mass, or have additional risk factors. I have reviewed this report and agree with the above findings. [facility] Electronically Signed   By: Lowella Grip III M.D.   On: 08/25/2019 16:05   DG CHEST PORT 1 VIEW  Result Date: 09/03/2019 CLINICAL DATA:  Dialysis catheter insertion. EXAM: PORTABLE CHEST 1 VIEW COMPARISON:  September 03, 2019. FINDINGS: The heart size and mediastinal contours are  within normal limits. No pneumothorax or pleural effusion is  noted. Left lung is clear. Mildly improved right lung opacities are noted. Left internal jugular Port-A-Cath is unchanged in position. Interval placement of right internal jugular catheter with distal tip in expected position of the SVC. The visualized skeletal structures are unremarkable. IMPRESSION: Interval placement of right internal jugular catheter with distal tip in expected position of the SVC. No pneumothorax is noted. Mildly improved right lung opacities are noted. Electronically Signed   By: Marijo Conception M.D.   On: 09/03/2019 15:43   DG CHEST PORT 1 VIEW  Result Date: 09/03/2019 CLINICAL DATA:  Shortness of breath. EXAM: PORTABLE CHEST 1 VIEW COMPARISON:  August 19, 2018. FINDINGS: The heart size and mediastinal contours are within normal limits. No pneumothorax or pleural effusion is noted. Left lung is clear. Left internal jugular Port-A-Cath is unchanged in position. Interval development of right upper and lower lobe airspace opacities are noted concerning for possible pneumonia. The visualized skeletal structures are unremarkable. IMPRESSION: Interval development of right upper and lower lobe airspace opacities concerning for possible pneumonia. Follow-up radiographs are recommended to ensure resolution. Electronically Signed   By: Marijo Conception M.D.   On: 09/03/2019 08:06   MM SCREENING BREAST TOMO UNI L  Result Date: 08/26/2019 CLINICAL DATA:  Screening. EXAM: DIGITAL SCREENING UNILATERAL LEFT MAMMOGRAM WITH CAD AND TOMO COMPARISON:  Previous exam(s). ACR Breast Density Category c: The breast tissue is heterogeneously dense, which may obscure small masses. FINDINGS: There are no findings suspicious for malignancy. Images were processed with CAD. IMPRESSION: No mammographic evidence of malignancy. A result letter of this screening mammogram will be mailed directly to the patient. RECOMMENDATION: Screening mammogram in one year. (Code:SM-B-01Y) BI-RADS CATEGORY  1: Negative. Electronically  Signed   By: Franki Cabot M.D.   On: 08/26/2019 12:36   CT Renal Stone Study  Result Date: 09/02/2019 CLINICAL DATA:  Flank pain. EXAM: CT ABDOMEN AND PELVIS WITHOUT CONTRAST TECHNIQUE: Multidetector CT imaging of the abdomen and pelvis was performed following the standard protocol without IV contrast. COMPARISON:  March 15, 2018 FINDINGS: Lower chest: A focal 9 mm solid pleural based nodularity in the right middle lobe on axial series 2, image 7 new since September 2019 with adjacent streaky consolidation in the right middle lobe, possibly aspiration related. Follow-up imaging is indicated to exclude an underlying metastatic nodule given postoperative changes from right mastectomy. Normal heart size. Mild mitral valvular calcification. Hepatobiliary: Cholecystectomy. Mild hepatomegaly. Benign hepatic cysts, similarly sized; no further imaging characterization recommended. Chronic 10 mm dilatation of the proximal common bile duct with normal distal tapering and no intrahepatic biliary duct dilatation. Pancreas: Normal. Spleen: Normal. Adrenals/Urinary Tract: Stable 1.8 cm benign adrenal adenoma measuring 10 Hounsfield units. Normal right adrenal gland. Bilateral moderate perinephric inflammatory changes, slightly increased. Nonobstructing right renal punctate calculus. Mild left hydronephrosis without an obstructing renal or ureteral calculus. Decompressed bilateral ureters. Diffusely thickened urinary bladder with perivesicular stranding. Stomach/Bowel: Mild nonspecific bowel wall thickening of the duodenum and left upper abdominal quadrant jejunum without perforation or free fluid. Mild adjacent inflammatory change. Reactive appearing lymph nodes in the associated mesenteric root. Nonobstructed. No colonic or gastric mucosal thickening. Small hiatal hernia. Normal appendix, axial series 2, image 67. Mild diverticulosis coli in the sigmoid and proximal transverse colon. Vascular/Lymphatic: Aortic calcified  atherosclerosis. No abdominal aortic aneurysm. No adenopathy. Reproductive: Atrophic or surgically absent uterus. No adnexal cyst or mass. Other: Midline infraumbilical hernia containing non-strangulated, non incarcerated and  nonobstructed small bowel loops. Small uncomplicated periumbilical herniation of fat. Musculoskeletal: Moderate degenerative change. Probable benign bone islands in the left hip. A sternal foramen. I discussed critical results and additional imaging recommendations by telephone at the time of interpretation on 09/02/2019 at 9:30 p.m. with provider Davonna Belling , who verbally acknowledged these results. IMPRESSION: Mild left hydronephrosis and diffuse circumferential wall thickening of the urinary bladder with mild perivesicular stranding. Findings support a clinical diagnosis of an acute cystitis with upper urinary tract infection. Bilateral moderate perinephric stranding could represent infection or medical renal disease. No obstructing renal or ureteral calculus. Punctate nonobstructing right renal calculus. A 9 mm solid pleural based right middle lobe pulmonary nodularity with adjacent streaky consolidation, which could be aspiration-related. Outpatient unenhanced CT chest recommended with surveillance CT Chest in 3 months should this nodularity persist. A metastatic or malignant pulmonary nodule are differential considerations, particularly given the history of right mastectomy. Mild enteritis, consistent with the patient history of nausea and vomiting. Hepatomegaly and stable benign hepatic cysts for which no further imaging characterization or surveillance is recommended. Uncomplicated infraumbilical abdominal wall herniation of small bowel loops without strangulation, incarceration or obstruction. Stable size of a small left adrenal benign adenoma. Electronically Signed   By: Revonda Humphrey   On: 09/02/2019 21:31

## 2019-09-07 NOTE — Progress Notes (Signed)
Asked about Cpap PT states her recent sleep study confirmed she does not need one. PT also states she has quit wearing cpap at home. RT will DC order.

## 2019-09-07 NOTE — H&P (Signed)
Chief Complaint: Renal failure  Referring Physician(s): Carolin Sicks  Supervising Physician: Arne Cleveland  Patient Status: [facility] - In-pt  History of Present Illness: Charlene Perry is a 69 y.o. female history of hypertension, DM, CHF, breast cancer status post right mastectomy and lymph node dissection and then chemo/radiation therapy in 2020.  She was admitted at [place] for sepsis due to UTI, nausea vomiting associated with AKI requiring CRRT.  She was transferred to [facility] for dialysis.  Her baseline creatinine is normal and has now reached 6.35.  We are asked to place a tunneled HD catheter.  She is NPO. She has a Port A Cath on the left.  Past Medical History:  Diagnosis Date  . Arthritis   . Breast cancer in female [facility])    Right  . Cancer ([place])   . Diabetes mellitus without complication ([place])   . Family history of breast cancer   . Family history of colon cancer   . Hypertension    takes fluid pills  . Hypothyroidism   . Pink eye disease of left eye    went to eye md and received drops - yesterday.  . Sleep apnea    back in the 1990's  . Vitiligo     Past Surgical History:  Procedure Laterality Date  . AXILLARY LYMPH NODE DISSECTION Right 07/13/2018   Procedure: RIGHT AXILLARY LYMPH NODE DISSECTION;  Surgeon: [name], MD;  Location: [place];  Service: General;  Laterality: Right;  . BREAST BIOPSY Bilateral 01/2019  . BREAST SURGERY     left breast fibro adenoma  . CATARACT EXTRACTION Right 2019  . CERVICAL FUSION N/A    C5 &6  . CESAREAN SECTION    . CHOLECYSTECTOMY  2004-2005  . CYSTECTOMY     patient denies  . EYE SURGERY    . IR IMAGING GUIDED PORT INSERTION  07/29/2018  . MASTECTOMY Right 06/21/2018  . MASTECTOMY WITH RADIOACTIVE SEED GUIDED EXCISION AND AXILLARY SENTINEL LYMPH NODE BIOPSY Right 06/21/2018  . MASTECTOMY WITH RADIOACTIVE SEED GUIDED EXCISION AND AXILLARY SENTINEL LYMPH NODE BIOPSY Right 06/21/2018   Procedure:  RIGHT TOTAL MASTECTOMY WITH RIGHT AXILLARY SENTINEL NODE BIOPSY AND RIGHT SEED GUIDED NODE EXCISION;  Surgeon: [name], MD;  Location: [place];  Service: General;  Laterality: Right;  . MYOMECTOMY  1986  . PARTIAL HYSTERECTOMY  1995  . PILONIDAL CYST EXCISION    . PORTACATH PLACEMENT N/A 07/13/2018   Procedure: INSERTION PORT-A-CATH WITH ULTRASOUND;  Surgeon: [name], MD;  Location: [place];  Service: General;  Laterality: N/A;  . TONSILLECTOMY  2002  . UVULOPALATOPHARYNGOPLASTY, TONSILLECTOMY AND SEPTOPLASTY      Allergies: Patient has no known allergies.  Medications: Prior to Admission medications   Medication Sig Start Date End Date Taking? Authorizing Provider  aspirin EC 81 MG tablet Take 81 mg by mouth daily.   Yes [provider]  celecoxib (CELEBREX) 200 MG capsule Take 200 mg by mouth daily.   Yes [provider]  ciprofloxacin (CIPRO) 500 MG tablet Take 500 mg by mouth in the morning and at bedtime. For 7 days, starting 08/29/2019. 08/29/19  Yes [provider]  furosemide (LASIX) 40 MG tablet Take 80 mg by mouth daily.   Yes [provider]  gabapentin (NEURONTIN) 400 MG capsule Take 400 mg by mouth 2 (two) times daily.   Yes [provider]  letrozole (FEMARA) 2.5 MG tablet Take 1 tablet (2.5 mg total) by mouth daily.  03/07/19  Yes [name], MD  levothyroxine (SYNTHROID, LEVOTHROID) 125 MCG tablet Take 125 mcg by mouth daily before breakfast.   Yes [provider]  losartan (COZAAR) 25 MG tablet Take 25 mg by mouth every morning. 07/23/19  Yes [provider]  metFORMIN (GLUCOPHAGE) 1000 MG tablet Take 1,000 mg by mouth 2 (two) times daily with a meal.   Yes [provider]  potassium chloride (KLOR-CON) 10 MEQ tablet Take 10 mEq by mouth 2 (two) times daily. 06/17/19  Yes [provider]  rosuvastatin (CRESTOR) 40 MG tablet Take 40 mg by mouth at bedtime.    Yes [provider]     Family History  Problem Relation Age of Onset  . Colon cancer Mother 19  . Colon cancer Sister 11  . Colon cancer Brother        ex early 6's  . Breast cancer Other        40's    Social History   Socioeconomic History  . Marital status: Married    Spouse name: Not on file  . Number of children: Not on file  . Years of education: Not on file  . Highest education level: Not on file  Occupational History  . Not on file  Tobacco Use  . Smoking status: Never Smoker  . Smokeless tobacco: Never Used  Substance and Sexual Activity  . Alcohol use: Not Currently  . Drug use: Never  . Sexual activity: Not on file  Other Topics Concern  . Not on file  Social History Narrative  . Not on file   Social Determinants of Health   Financial Resource Strain:   . Difficulty of Paying Living Expenses:   Food Insecurity:   . Worried About Charity fundraiser in the Last Year:   . Arboriculturist in the Last Year:   Transportation Needs:   . Film/video editor (Medical):   Marland Kitchen Lack of Transportation (Non-Medical):   Physical Activity:   . Days of Exercise per Week:   . Minutes of Exercise per Session:   Stress:   . Feeling of Stress :   Social Connections:   . Frequency of Communication with Friends and Family:   . Frequency of Social Gatherings with Friends and Family:   . Attends Religious Services:   . Active Member of Clubs or Organizations:   . Attends Archivist Meetings:   Marland Kitchen Marital Status:      Review of Systems: A 12 point ROS discussed and pertinent positives are indicated in the HPI above.  All other systems are negative.  Review of Systems  Vital Signs: BP (!) 156/73 (BP Location: Left Arm)   Pulse 96   Temp 98.9 F (37.2 C) (Oral)   Resp (!) 23   Ht 5\' 6"  (1.676 m)   Wt 105 kg   SpO2 97%   BMI 37.36 kg/m   Physical Exam Vitals reviewed.  Constitutional:      Appearance: Normal appearance.  HENT:     Head:  Normocephalic and atraumatic.  Eyes:     Extraocular Movements: Extraocular movements intact.  Cardiovascular:     Rate and Rhythm: Normal rate and regular rhythm.  Pulmonary:     Effort: Pulmonary effort is normal. No respiratory distress.     Breath sounds: Normal breath sounds.  Abdominal:     General: There is no distension.     Palpations: Abdomen is soft.  Tenderness: There is no abdominal tenderness.  Musculoskeletal:        General: Normal range of motion.  Skin:    General: Skin is warm and dry.  Neurological:     General: No focal deficit present.     Mental Status: She is alert and oriented to person, place, and time.  Psychiatric:        Mood and Affect: Mood normal.        Behavior: Behavior normal.        Thought Content: Thought content normal.        Judgment: Judgment normal.     Imaging: DG Bone Density  Result Date: 08/25/2019 EXAM: DUAL X-RAY ABSORPTIOMETRY (DXA) FOR BONE MINERAL DENSITY IMPRESSION: Referring Physician:  Gardenia Phlegm Your patient completed a BMD test using Lunar IDXA DXA system ( analysis version: 16 ) manufactured by [employer]. Technologist: AW PATIENT: Name: Tiearra, Colwell Patient ID: [id number] Birth Date: 09/21/1950 Height: 66.0 in. Sex: Female Measured: 08/25/2019 Weight: 224.6 lbs. Indications: Breast Cancer History, Diabetic non insulin, Estrogen Deficient, Gabapentin, Hypothyroid, Hysterectomy, Letrozole, Levothyroxine Fractures: None Treatments: None ASSESSMENT: The BMD measured at Femur Neck Left is 0.987 g/cm2 with a T-score of -0.4. This patient is considered normal according to [facility]) criteria. The scan quality is good. L-3 and L-4 were excluded due to degenerative changes. Site Region Measured Date Measured Age YA BMD Significant CHANGE T-score DualFemur Neck Left  10/25/2019    66.8         -0.6    0.987 g/cm2 AP Spine  L1-L2      10/25/2019    66.8         0.10     1.267 g/cm2 DualFemur  Total Mean 10/25/2019    66.8         0.2     1.032 g/cm2 World Health Organization [facility]) criteria for post-menopausal, Caucasian Women: Normal       T-score at or above -1 SD Osteopenia   T-score between -1 and -2.5 SD Osteoporosis T-score at or below -2.5 SD RECOMMENDATION: 1. All patients should optimize calcium and vitamin D intake. 2. Consider FDA approved medical therapies in postmenopausal women and men aged 36 years and older, based on the following: a. A hip or vertebral (clinical or morphometric) fracture b. T- score < or = -2.5 at the femoral neck or spine after appropriate evaluation to exclude secondary causes c. Low bone mass (T-score between -1.0 and -2.5 at the femoral neck or spine) and a 10 year probability of a hip fracture > or = 3% or a 10 year probability of a major osteoporosis-related fracture > or = 20% based on the [place]-adapted WHO algorithm d. Clinician judgment and/or patient preferences may indicate treatment for people with 10-year fracture probabilities above or below these levels FOLLOW-UP: Patients with diagnosis of osteoporosis or at high risk for fracture should have regular bone mineral density tests. For patients eligible for Medicare, routine testing is allowed once every 2 years. The testing frequency can be increased to one year for patients who have rapidly progressing disease, those who are receiving or discontinuing medical therapy to restore bone mass, or have additional risk factors. I have reviewed this report and agree with the above findings. [facility] Radiology Electronically Signed   By: Lowella Grip III M.D.   On: 08/25/2019 16:05   DG CHEST PORT 1 VIEW  Result Date: 09/03/2019 CLINICAL DATA:  Dialysis catheter insertion. EXAM: PORTABLE  CHEST 1 VIEW COMPARISON:  September 03, 2019. FINDINGS: The heart size and mediastinal contours are within normal limits. No pneumothorax or pleural effusion is noted. Left lung is clear. Mildly improved right lung opacities are  noted. Left internal jugular Port-A-Cath is unchanged in position. Interval placement of right internal jugular catheter with distal tip in expected position of the SVC. The visualized skeletal structures are unremarkable. IMPRESSION: Interval placement of right internal jugular catheter with distal tip in expected position of the SVC. No pneumothorax is noted. Mildly improved right lung opacities are noted. Electronically Signed   By: Marijo Conception M.D.   On: 09/03/2019 15:43   DG CHEST PORT 1 VIEW  Result Date: 09/03/2019 CLINICAL DATA:  Shortness of breath. EXAM: PORTABLE CHEST 1 VIEW COMPARISON:  August 19, 2018. FINDINGS: The heart size and mediastinal contours are within normal limits. No pneumothorax or pleural effusion is noted. Left lung is clear. Left internal jugular Port-A-Cath is unchanged in position. Interval development of right upper and lower lobe airspace opacities are noted concerning for possible pneumonia. The visualized skeletal structures are unremarkable. IMPRESSION: Interval development of right upper and lower lobe airspace opacities concerning for possible pneumonia. Follow-up radiographs are recommended to ensure resolution. Electronically Signed   By: Marijo Conception M.D.   On: 09/03/2019 08:06   MM SCREENING BREAST TOMO UNI L  Result Date: 08/26/2019 CLINICAL DATA:  Screening. EXAM: DIGITAL SCREENING UNILATERAL LEFT MAMMOGRAM WITH CAD AND TOMO COMPARISON:  Previous exam(s). ACR Breast Density Category c: The breast tissue is heterogeneously dense, which may obscure small masses. FINDINGS: There are no findings suspicious for malignancy. Images were processed with CAD. IMPRESSION: No mammographic evidence of malignancy. A result letter of this screening mammogram will be mailed directly to the patient. RECOMMENDATION: Screening mammogram in one year. (Code:SM-B-01Y) BI-RADS CATEGORY  1: Negative. Electronically Signed   By: Franki Cabot M.D.   On: 08/26/2019 12:36   CT Renal  Stone Study  Result Date: 09/02/2019 CLINICAL DATA:  Flank pain. EXAM: CT ABDOMEN AND PELVIS WITHOUT CONTRAST TECHNIQUE: Multidetector CT imaging of the abdomen and pelvis was performed following the standard protocol without IV contrast. COMPARISON:  March 15, 2018 FINDINGS: Lower chest: A focal 9 mm solid pleural based nodularity in the right middle lobe on axial series 2, image 7 new since September 2019 with adjacent streaky consolidation in the right middle lobe, possibly aspiration related. Follow-up imaging is indicated to exclude an underlying metastatic nodule given postoperative changes from right mastectomy. Normal heart size. Mild mitral valvular calcification. Hepatobiliary: Cholecystectomy. Mild hepatomegaly. Benign hepatic cysts, similarly sized; no further imaging characterization recommended. Chronic 10 mm dilatation of the proximal common bile duct with normal distal tapering and no intrahepatic biliary duct dilatation. Pancreas: Normal. Spleen: Normal. Adrenals/Urinary Tract: Stable 1.8 cm benign adrenal adenoma measuring 10 Hounsfield units. Normal right adrenal gland. Bilateral moderate perinephric inflammatory changes, slightly increased. Nonobstructing right renal punctate calculus. Mild left hydronephrosis without an obstructing renal or ureteral calculus. Decompressed bilateral ureters. Diffusely thickened urinary bladder with perivesicular stranding. Stomach/Bowel: Mild nonspecific bowel wall thickening of the duodenum and left upper abdominal quadrant jejunum without perforation or free fluid. Mild adjacent inflammatory change. Reactive appearing lymph nodes in the associated mesenteric root. Nonobstructed. No colonic or gastric mucosal thickening. Small hiatal hernia. Normal appendix, axial series 2, image 67. Mild diverticulosis coli in the sigmoid and proximal transverse colon. Vascular/Lymphatic: Aortic calcified atherosclerosis. No abdominal aortic aneurysm. No adenopathy.  Reproductive: Atrophic or surgically  absent uterus. No adnexal cyst or mass. Other: Midline infraumbilical hernia containing non-strangulated, non incarcerated and nonobstructed small bowel loops. Small uncomplicated periumbilical herniation of fat. Musculoskeletal: Moderate degenerative change. Probable benign bone islands in the left hip. A sternal foramen. I discussed critical results and additional imaging recommendations by telephone at the time of interpretation on 09/02/2019 at 9:30 p.m. with provider Davonna Belling , who verbally acknowledged these results. IMPRESSION: Mild left hydronephrosis and diffuse circumferential wall thickening of the urinary bladder with mild perivesicular stranding. Findings support a clinical diagnosis of an acute cystitis with upper urinary tract infection. Bilateral moderate perinephric stranding could represent infection or medical renal disease. No obstructing renal or ureteral calculus. Punctate nonobstructing right renal calculus. A 9 mm solid pleural based right middle lobe pulmonary nodularity with adjacent streaky consolidation, which could be aspiration-related. Outpatient unenhanced CT chest recommended with surveillance CT Chest in 3 months should this nodularity persist. A metastatic or malignant pulmonary nodule are differential considerations, particularly given the history of right mastectomy. Mild enteritis, consistent with the patient history of nausea and vomiting. Hepatomegaly and stable benign hepatic cysts for which no further imaging characterization or surveillance is recommended. Uncomplicated infraumbilical abdominal wall herniation of small bowel loops without strangulation, incarceration or obstruction. Stable size of a small left adrenal benign adenoma. Electronically Signed   By: Revonda Humphrey   On: 09/02/2019 21:31    Labs:  CBC: Recent Labs    12/28/18 0822 09/02/19 1758 09/03/19 0430 09/07/19 0502  WBC 11.7* 8.2 9.0 5.1  HGB 10.6*  11.4* 10.2* 8.0*  HCT 33.0* 35.9* 32.4* 24.6*  PLT 230 253 241 191    COAGS: Recent Labs    09/02/19 2310 09/04/19 0810 09/05/19 0546 09/06/19 0858  INR 1.2  --   --   --   APTT 34 41* 54* 39*    BMP: Recent Labs    09/04/19 1600 09/05/19 0546 09/06/19 0858 09/07/19 0502  NA 136 136 139 138  K 4.5 4.1 4.0 3.6  CL 98 102 105 104  CO2 25 25 23 27   GLUCOSE 176* 102* 111* 119*  BUN 58* 38* 33* 15  CALCIUM 8.1* 7.8* 8.0* 7.8*  CREATININE 6.29* 4.67* 6.35* 5.12*  GFRNONAA 6* 9* 6* 8*  GFRAA 7* 11* 7* 9*    LIVER FUNCTION TESTS: Recent Labs    12/21/18 1227 12/21/18 1227 12/28/18 0822 12/28/18 0822 09/02/19 1758 09/02/19 1758 09/03/19 0430 09/03/19 1548 09/04/19 1600 09/05/19 0546 09/06/19 0858 09/07/19 0502  BILITOT 0.4  --  0.3  --  0.3  --  0.6  --   --   --   --   --   AST 18  --  17  --  22  --  25  --   --   --   --   --   ALT 23  --  25  --  16  --  18  --   --   --   --   --   ALKPHOS 101  --  99  --  73  --  66  --   --   --   --   --   PROT 6.9  --  7.0  --  7.8  --  6.0*  --   --   --   --   --   ALBUMIN 3.7   < > 3.9   < > 3.6   < > 2.7*   < >  3.0* 2.6* 2.4* 2.4*   < > = values in this interval not displayed.    TUMOR MARKERS: No results for input(s): AFPTM, CEA, CA199, CHROMGRNA in the last 8760 hours.  Assessment and Plan:  Renal failure  Will proceed with placement of tunneled HD catheter today by Dr. Vernard Gambles.  Risks and benefits discussed with the patient including, but not limited to bleeding, infection, vascular injury, pneumothorax which may require chest tube placement, air embolism or even death  All of the patient's questions were answered, patient is agreeable to proceed. Consent signed and in chart.  Thank you for this interesting consult.  I greatly enjoyed meeting Maxie Debose [facility] and look forward to participating in their care.  A copy of this report was sent to the requesting provider on this date.  Electronically  Signed: Murrell Redden, PA-C   09/07/2019, 8:36 AM      I spent a total of 20 Minutes in face to face in clinical consultation, greater than 50% of which was counseling/coordinating care for tunneled HD catheter.

## 2019-09-07 NOTE — Progress Notes (Signed)
Patient ID: Charlene Perry, female   DOB: 12-Aug-1950, 69 y.o.   MRN: [id number] [facility] Progress Note   Assessment/ Plan:   1. Acute kidney Injury: Clinically suspected to be from ATN in the setting of significant GI volume depletion with ongoing ARB/diuretics plus/minus UTI.  She remains anuric and without any evidence of renal recovery and is now transitioned to intermittent hemodialysis.  I appreciate assistance from interventional radiology with placement of tunneled hemodialysis catheter and effort is underway for outpatient dialysis unit placement for acute kidney injury.  We will preemptively order dialysis for tomorrow. 2.  Urinary tract infection with sepsis: Continues to show good clinical improvement on ceftriaxone.  Hemodynamically stable. 3.  Anemia: Likely secondary to critical illness, will continue to follow off of ESA. 4.  Hypertension: Blood pressures fairly controlled-continue to hold ARB and monitor with UF at HD.  Subjective:   Reports to be feeling fair and feeling optimistic for renal recovery.   Objective:   BP (!) 148/80   Pulse 90   Temp 98.7 F (37.1 C)   Resp 18   Ht 5\' 6"  (1.676 m)   Wt 105 kg   SpO2 97%   BMI 37.36 kg/m   Intake/Output Summary (Last 24 hours) at 09/07/2019 1154 Last data filed at 09/06/2019 2116 Gross per 24 hour  Intake 243 ml  Output 2000 ml  Net -1757 ml   Weight change: 0.2 kg  Physical Exam: Gen: Obese woman, well kempt, resting comfortably in bed CVS: Pulse regular rhythm, normal rate, S1 and S2 normal.  Right IJ TDC in situ Resp: Diminished breath sounds over bases otherwise clear to auscultation, no rales/rhonchi Abd: Soft, obese, nontender Ext: Trace lower extremity edema.  Right femoral vein temporary HD catheter.  Imaging: IR Fluoro Guide CV Line Right  Result Date: 09/07/2019 CLINICAL DATA:  Renal failure. Femoral Trialysis catheter. Needs durable long-term venous access for hemodialysis EXAM:  TUNNELED HEMODIALYSIS CATHETER PLACEMENT WITH ULTRASOUND AND FLUOROSCOPIC GUIDANCE TECHNIQUE: The procedure, risks, benefits, and alternatives were explained to the patient. Questions regarding the procedure were encouraged and answered. The patient understands and consents to the procedure. Patient was already receiving adequate prophylactic antibiotic coverage as an inpatient. Patency of the right IJ vein was confirmed with ultrasound with image documentation. An appropriate skin site was determined. Region was prepped using maximum barrier technique including cap and mask, sterile gown, sterile gloves, large sterile sheet, and Chlorhexidine as cutaneous antisepsis. The region was infiltrated locally with 1% lidocaine. Intravenous Fentanyl 65mcg and Versed 1.5mg  were administered as conscious sedation during continuous monitoring of the patient's level of consciousness and physiological / cardiorespiratory status by the radiology RN, with a total moderate sedation time of 16 minutes. Under real-time ultrasound guidance, the right IJ vein was accessed with a 21 gauge micropuncture needle; the needle tip within the vein was confirmed with ultrasound image documentation. Needle exchanged over the 018 guidewire for transitional dilator, which allowed advancement of a Benson wire into the IVC. Over this, an MPA catheter was advanced. A Palindrome 23 hemodialysis catheter was tunneled from the right anterior chest wall approach to the right IJ dermatotomy site. The MPA catheter was exchanged over an Amplatz wire for serial vascular dilators which allow placement of a peel-away sheath, through which the catheter was advanced under intermittent fluoroscopy, positioned with its tips in the proximal and midright atrium. Spot chest radiograph confirms good catheter position. No pneumothorax. Catheter was flushed and primed per protocol. Catheter secured  externally with O Prolene sutures. The right IJ dermatotomy site was  closed with Dermabond. COMPLICATIONS: COMPLICATIONS None immediate FLUOROSCOPY TIME:  30 seconds; 6 mGy COMPARISON:  None IMPRESSION: 1. Technically successful placement of tunneled right IJ hemodialysis catheter with ultrasound and fluoroscopic guidance. Ready for routine use. ACCESS: Remains approachable for percutaneous intervention as needed. Electronically Signed   By: Lucrezia Europe M.D.   On: 09/07/2019 10:45   IR US Guide Vasc Access Right  Result Date: 09/07/2019 CLINICAL DATA:  Renal failure. Femoral Trialysis catheter. Needs durable long-term venous access for hemodialysis EXAM: TUNNELED HEMODIALYSIS CATHETER PLACEMENT WITH ULTRASOUND AND FLUOROSCOPIC GUIDANCE TECHNIQUE: The procedure, risks, benefits, and alternatives were explained to the patient. Questions regarding the procedure were encouraged and answered. The patient understands and consents to the procedure. Patient was already receiving adequate prophylactic antibiotic coverage as an inpatient. Patency of the right IJ vein was confirmed with ultrasound with image documentation. An appropriate skin site was determined. Region was prepped using maximum barrier technique including cap and mask, sterile gown, sterile gloves, large sterile sheet, and Chlorhexidine as cutaneous antisepsis. The region was infiltrated locally with 1% lidocaine. Intravenous Fentanyl 14mcg and Versed 1.5mg  were administered as conscious sedation during continuous monitoring of the patient's level of consciousness and physiological / cardiorespiratory status by the radiology RN, with a total moderate sedation time of 16 minutes. Under real-time ultrasound guidance, the right IJ vein was accessed with a 21 gauge micropuncture needle; the needle tip within the vein was confirmed with ultrasound image documentation. Needle exchanged over the 018 guidewire for transitional dilator, which allowed advancement of a Benson wire into the IVC. Over this, an MPA catheter was advanced.  A Palindrome 23 hemodialysis catheter was tunneled from the right anterior chest wall approach to the right IJ dermatotomy site. The MPA catheter was exchanged over an Amplatz wire for serial vascular dilators which allow placement of a peel-away sheath, through which the catheter was advanced under intermittent fluoroscopy, positioned with its tips in the proximal and midright atrium. Spot chest radiograph confirms good catheter position. No pneumothorax. Catheter was flushed and primed per protocol. Catheter secured externally with O Prolene sutures. The right IJ dermatotomy site was closed with Dermabond. COMPLICATIONS: COMPLICATIONS None immediate FLUOROSCOPY TIME:  30 seconds; 6 mGy COMPARISON:  None IMPRESSION: 1. Technically successful placement of tunneled right IJ hemodialysis catheter with ultrasound and fluoroscopic guidance. Ready for routine use. ACCESS: Remains approachable for percutaneous intervention as needed. Electronically Signed   By: Lucrezia Europe M.D.   On: 09/07/2019 10:45    Labs: BMET Recent Labs  Lab 09/03/19 1151 09/03/19 1548 09/04/19 0508 09/04/19 1600 09/05/19 0546 09/06/19 0858 09/07/19 0502  NA 136 138 136 136 136 139 138  K 5.3* 5.2* 4.5 4.5 4.1 4.0 3.6  CL 96* 97* 100 98 102 105 104  CO2 17* 19* 25 25 25 23 27   GLUCOSE 76 76 126* 176* 102* 111* 119*  BUN 78* 83* 60* 58* 38* 33* 15  CREATININE 7.73* 7.69* 5.34* 6.29* 4.67* 6.35* 5.12*  CALCIUM 8.3* 8.2* 7.7* 8.1* 7.8* 8.0* 7.8*  PHOS  --  8.4* 4.8* 5.2* 3.6 3.8 3.4   CBC Recent Labs  Lab 09/02/19 1758 09/03/19 0430 09/07/19 0502  WBC 8.2 9.0 5.1  NEUTROABS  --  7.0  --   HGB 11.4* 10.2* 8.0*  HCT 35.9* 32.4* 24.6*  MCV 91.3 91.3 87.2  PLT 253 241 191   Medications:    . carvedilol  12.5  mg Oral BID WC  . Chlorhexidine Gluconate Cloth  6 each Topical Daily  . fentaNYL      . heparin      . heparin  5,000 Units Subcutaneous Q8H  . insulin aspart  0-9 Units Subcutaneous TID WC  . letrozole  2.5  mg Oral Daily  . levothyroxine  125 mcg Oral QAC breakfast  . lidocaine      . midazolam      . sodium chloride flush  3 mL Intravenous Q12H   Elmarie Shiley, MD 09/07/2019, 11:54 AM

## 2019-09-07 NOTE — Progress Notes (Signed)
Occupational Therapy Evaluation  Clinical Impressions: PTA, pt lives in multi-level home with husband. Pt Independent with ADLs, IADLs and mobility without AD. Pt was also working full time. Pt lethargic this AM after dialysis catheter placement, but agreeable to participate with OT. Pt Modified Independent for bed mobility with increased time, HOB elevated and use of bed rails prn. Pt reporting mild dizziness sitting EOB, but BP WFL (see above). Pt Max A to don/doff socks sitting EOB, unable to reach feet after discomfort from procedure this AM. Pt Supervision for sit to stand without AD, short distance mobility in room, and stand pivot back to bed. Pt did use IV pole for support intermittently. Suspect pt will improve functionally tomorrow once lethargy and pain from procedure subsides. Recommend HHOT for follow-up at home based on current functional abilities and amount of steps in home. Will continue to follow acutely and update recommendations as needed.     09/07/19 1000  OT Visit Information  Last OT Received On 09/07/19  Assistance Needed +1  History of Present Illness Patient is a 69 y.o. female with PMHx of breast cancer-s/p mastectomy and then chemo/radiation in 4235, chronic diastolic heart failure, hypothyroidism, HTN, DM-2 presented to the ED with several days history of nausea, vomiting, diarrhea-she was found to have sepsis secondary to UTI along with AKI.  Hospital course complicated by worsening AKI requiring CRRT.  Transfer to [facility] on 3/22 for intermittent HD.   Precautions  Precautions Fall  Restrictions  Weight Bearing Restrictions No  Home Living  Family/patient expects to be discharged to: Private residence  Living Arrangements Spouse/significant other  Available Help at Discharge Family  Type of [place] Access Level entry (through garage )  Home Layout Multi-level;Full bath on main level (4 levels including basement)  Alternate Level Stairs-Number of Steps 8-9    Alternate Level Stairs-Rails Right  Bathroom Producer, television/film/video seat - built in;Cane - single point;Crutches  Additional Comments Pt lives in multi level home, does not use basement, main bedrooms upstairs   Prior Function  Level of Independence Independent  Comments Independent in all ADLs, IADLs, mobility. Was still working full time (remotely recently due to covid)   Communication  Communication No difficulties  Pain Assessment  Pain Assessment 0-10  Pain Score 8  Pain Location at chest port site  Pain Descriptors / Indicators Aching;Discomfort;Sore  Pain Intervention(s) Monitored during session;Limited activity within patient's tolerance;Other (comment) (notified RN)  Cognition  Arousal/Alertness Lethargic (after procedure)  Behavior During Therapy WFL for tasks assessed/performed  Overall Cognitive Status Within Functional Limits for tasks assessed  Upper Extremity Assessment  Upper Extremity Assessment Generalized weakness  Lower Extremity Assessment  Lower Extremity Assessment Defer to PT evaluation  Cervical / Trunk Assessment  Cervical / Trunk Assessment Normal  ADL  Overall ADL's  Needs assistance/impaired  Eating/Feeding Independent;Sitting  Grooming Supervision/safety;Standing  Upper Body Bathing Set up;Sitting  Lower Body Bathing Sit to/from stand;Minimal assistance  Upper Body Dressing  Set up;Sitting  Lower Body Dressing Minimal assistance;Sit to/from stand;Sitting/lateral leans;Moderate assistance  Lower Body Dressing Details (indicate cue type and reason) Pt Max A for donning/doffing socks sitting EOB due to discomfort from dialysis port placement inhibiting ability to reach feet  Toilet Transfer Set up;Supervision/safety;Stand-pivot;BSC  Toileting- Clothing Manipulation and Hygiene Set up;Supervision/safety;Sit to/from stand  Functional mobility during ADLs Supervision/safety  General ADL Comments Pt  with increased lethargy and discomfort after dialysis port placement procedure this AM.  Pt may demonstrate improved functional abilities tommorrow.   Bed Mobility  Overal bed mobility Modified Independent  General bed mobility comments Modified Independent with increased time, HOB elevated and use of bedrails prn  Transfers  Overall transfer level Needs assistance  Equipment used None  Transfers Sit to/from Stand;Stand Pivot Transfers  Sit to Stand Supervision  Stand pivot transfers Supervision  General transfer comment Supervision to ensure safety, as pt reporting some dizziness with movement (BP WFL)  Balance  Overall balance assessment No apparent balance deficits (not formally assessed)  General Comments  General comments (skin integrity, edema, etc.) 96% O2 at rest, 90 HR, 27 RR. After sitting EOB, BP 159/75  OT - End of Session  Equipment Utilized During Treatment Gait belt  Activity Tolerance Patient tolerated treatment well;Patient limited by lethargy  Patient left in chair;with call bell/phone within reach  Nurse Communication Mobility status;Other (comment) (pain reports )  OT Assessment  OT Recommendation/Assessment Patient needs continued OT Services  OT Visit Diagnosis Other abnormalities of gait and mobility (R26.89);Muscle weakness (generalized) (M62.81)  OT Problem List Decreased activity tolerance;Decreased strength  OT Plan  OT Frequency (ACUTE ONLY) Min 3X/week  OT Treatment/Interventions (ACUTE ONLY) Self-care/ADL training;Therapeutic exercise;Energy conservation;Therapeutic activities;Patient/family education  AM-PAC OT "6 Clicks" Daily Activity Outcome Measure (Version 2)  Help from another person eating meals? 4  Help from another person taking care of personal grooming? 3  Help from another person toileting, which includes using toliet, bedpan, or urinal? 3  Help from another person bathing (including washing, rinsing, drying)? 3  Help from another person to  put on and taking off regular upper body clothing? 3  Help from another person to put on and taking off regular lower body clothing? 3  6 Click Score 19  OT Recommendation  Follow Up Recommendations Home health OT;Supervision - Intermittent  OT Equipment Other (comment) (TBD, based on progress)  Individuals Consulted  Consulted and Agree with Results and Recommendations Patient  Acute Rehab OT Goals  Patient Stated Goal go home, have no need to use DME  OT Goal Formulation With patient  Time For Goal Achievement 09/21/19  Potential to Achieve Goals Good  OT Time Calculation  OT Start Time (ACUTE ONLY) 1030  OT Stop Time (ACUTE ONLY) 1114  OT Time Calculation (min) 44 min  OT General Charges  $OT Visit 1 Visit  OT Evaluation  $OT Eval Moderate Complexity 1 Mod  OT Treatments  $Self Care/Home Management  8-22 mins  $Therapeutic Activity 8-22 mins  Written Expression  Dominant Hand Right

## 2019-09-07 NOTE — Procedures (Signed)
  Procedure: R IJ tunneled HD catheter placement   EBL:   minimal Complications:  none immediate  See full dictation in [employer].  Dillard Cannon MD Main # [phone] Pager  [phone]

## 2019-09-07 NOTE — Progress Notes (Addendum)
   09/07/19 0836  PT Visit Information  Reason Eval/Treat Not Completed Patient at procedure or test/unavailable (per OT, patient off the floor getting dialysis catheter placed this morning)

## 2019-09-08 LAB — RENAL FUNCTION PANEL
Albumin: 2.5 g/dL — ABNORMAL LOW (ref 3.5–5.0)
Anion gap: 11 (ref 5–15)
BUN: 25 mg/dL — ABNORMAL HIGH (ref 8–23)
CO2: 24 mmol/L (ref 22–32)
Calcium: 8.2 mg/dL — ABNORMAL LOW (ref 8.9–10.3)
Chloride: 104 mmol/L (ref 98–111)
Creatinine, Ser: 7.17 mg/dL — ABNORMAL HIGH (ref 0.44–1.00)
GFR calc Af Amer: 6 mL/min — ABNORMAL LOW (ref >60)
GFR calc non Af Amer: 5 mL/min — ABNORMAL LOW (ref >60)
Glucose, Bld: 111 mg/dL — ABNORMAL HIGH (ref 70–99)
Phosphorus: 5.8 mg/dL — ABNORMAL HIGH (ref 2.5–4.6)
Potassium: 4.1 mmol/L (ref 3.5–5.1)
Sodium: 139 mmol/L (ref 135–145)

## 2019-09-08 LAB — CBC
HCT: 25.5 % — ABNORMAL LOW (ref 36.0–46.0)
Hemoglobin: 8 g/dL — ABNORMAL LOW (ref 12.0–15.0)
MCH: 28.4 pg (ref 26.0–34.0)
MCHC: 31.4 g/dL (ref 30.0–36.0)
MCV: 90.4 fL (ref 80.0–100.0)
Platelets: 237 10*3/uL (ref 150–400)
RBC: 2.82 MIL/uL — ABNORMAL LOW (ref 3.87–5.11)
RDW: 14.4 % (ref 11.5–15.5)
WBC: 4.9 10*3/uL (ref 4.0–10.5)
nRBC: 0 % (ref 0.0–0.2)

## 2019-09-08 LAB — GLUCOSE, CAPILLARY
Glucose-Capillary: 85 mg/dL (ref 70–99)
Glucose-Capillary: 111 mg/dL — ABNORMAL HIGH (ref 70–99)

## 2019-09-08 LAB — PTH, INTACT AND CALCIUM
Calcium, Total (PTH): 7.8 mg/dL — ABNORMAL LOW (ref 8.7–10.3)
PTH: 29 pg/mL (ref 15–65)

## 2019-09-08 MED ORDER — HEPARIN SODIUM (PORCINE) 1000 UNIT/ML DIALYSIS: 40.0000 [IU]/kg | INTRAMUSCULAR | Status: DC | PRN

## 2019-09-08 MED ORDER — HEPARIN SODIUM (PORCINE) 1000 UNIT/ML IJ SOLN
INTRAMUSCULAR | Status: AC
  Administered 2019-09-08: 3800 [IU]
  Filled 2019-09-08: qty 4

## 2019-09-08 NOTE — Evaluation (Signed)
Physical Therapy Evaluation Patient Details Name: Charlene Perry MRN: [id number] DOB: 21-May-1951 Today's Date: 09/08/2019   History of Present Illness  Patient is a 69 y.o. female with PMHx of breast cancer-s/p mastectomy and then chemo/radiation in 8295, chronic diastolic heart failure, hypothyroidism, HTN, DM-2 presented to the ED with several days history of nausea, vomiting, diarrhea-she was found to have sepsis secondary to UTI along with AKI.  Hospital course complicated by worsening AKI requiring CRRT.  Transfer to [facility] on 3/22 for intermittent HD.   Clinical Impression  PTA pt living with husband in split level home with 3 steps to enter and 16 steps to bed and bath. Pt reports complete independence prior to hospitalization. Pt reports just returning from HD and is fatigued and queasy. Pt is limited in safe mobility by decreased strength and endurance. Pt requires mod I for bed mobility, supervision for transfers and min guard for ambulation of 18 feet with RW. PT recommending HHPT to improve strength and endurance for safe mobility. PT will continue to follow acutely.    Follow Up Recommendations Home health PT;Supervision for mobility/OOB    Equipment Recommendations  Rolling walker with 5" wheels    Recommendations for Other Services       Precautions / Restrictions Precautions Precautions: Fall Restrictions Weight Bearing Restrictions: No      Mobility  Bed Mobility Overal bed mobility: Modified Independent             General bed mobility comments: Modified Independent with increased time, HOB elevated and use of bedrails prn  Transfers Overall transfer level: Needs assistance Equipment used: None Transfers: Sit to/from [employer] Transfers Sit to Stand: Supervision         General transfer comment: supervision for safety, vc for hand placement for power up and steadying, pt with c/o overall queasy feeling  Ambulation/Gait Ambulation/Gait  assistance: Min guard Gait Distance (Feet): 18 Feet Assistive device: Rolling walker (2 wheeled) Gait Pattern/deviations: Step-through pattern;Decreased step length - right;Decreased step length - left;Shuffle Gait velocity: slowed Gait velocity interpretation: 1.31 - 2.62 ft/sec, indicative of limited community ambulator General Gait Details: min guard for slow shuffling gait, no overt LoB, vc for management of RW around obstacles.        Balance Overall balance assessment: No apparent balance deficits (not formally assessed)                                           Pertinent Vitals/Pain Pain Assessment: 0-10 Pain Score: 8  Pain Location: at chest port site Pain Descriptors / Indicators: Aching;Discomfort;Sore Pain Intervention(s): Limited activity within patient's tolerance;Monitored during session;Repositioned    Home Living Family/patient expects to be discharged to:: Private residence Living Arrangements: Spouse/significant other Available Help at Discharge: Family Type of Home: House Home Access: Level entry(through garage )     Home Layout: Multi-level;Full bath on main level(4 levels including basement) Home Equipment: Shower seat - built in;Cane - single point;Crutches Additional Comments: Pt lives in multi level home, does not use basement, main bedrooms upstairs     Prior Function Level of Independence: Independent         Comments: Independent in all ADLs, IADLs, mobility. Was still working full time (remotely recently due to covid)      Hand Dominance   Dominant Hand: Right    Extremity/Trunk Assessment   Upper Extremity Assessment Upper Extremity  Assessment: Defer to OT evaluation    Lower Extremity Assessment Lower Extremity Assessment: Generalized weakness;Overall [facility] for tasks assessed    Cervical / Trunk Assessment Cervical / Trunk Assessment: Normal  Communication   Communication: No difficulties  Cognition  Arousal/Alertness: Lethargic(after procedure) Behavior During Therapy: WFL for tasks assessed/performed Overall Cognitive Status: Within Functional Limits for tasks assessed                                        General Comments General comments (skin integrity, edema, etc.): VSS on RA        Assessment/Plan    PT Assessment Patient needs continued PT services  PT Problem List Decreased activity tolerance;Decreased mobility;Pain       PT Treatment Interventions DME instruction;Gait training;Stair training;Functional mobility training;Therapeutic activities;Therapeutic exercise;Balance training;Cognitive remediation;Patient/family education    PT Goals (Current goals can be found in the Care Plan section)  Acute Rehab PT Goals Patient Stated Goal: go home, have no need to use DME PT Goal Formulation: With patient/family Time For Goal Achievement: 09/22/19 Potential to Achieve Goals: Good    Frequency Min 3X/week    AM-PAC PT "6 Clicks" Mobility  Outcome Measure Help needed turning from your back to your side while in a flat bed without using bedrails?: None Help needed moving from lying on your back to sitting on the side of a flat bed without using bedrails?: A Little Help needed moving to and from a bed to a chair (including a wheelchair)?: None Help needed standing up from a chair using your arms (e.g., wheelchair or bedside chair)?: None Help needed to walk in hospital room?: A Little Help needed climbing 3-5 steps with a railing? : A Lot 6 Click Score: 20    End of Session Equipment Utilized During Treatment: Gait belt Activity Tolerance: Patient limited by fatigue Patient left: in bed;with call bell/phone within reach;with bed alarm set;with family/visitor present Nurse Communication: Mobility status PT Visit Diagnosis: Unsteadiness on feet (R26.81);Other abnormalities of gait and mobility (R26.89);Muscle weakness (generalized) (M62.81);Difficulty  in walking, not elsewhere classified (R26.2);Pain Pain - part of body: (chest)    Time: 1535-1601 PT Time Calculation (min) (ACUTE ONLY): 26 min   Charges:   PT Evaluation $PT Eval Moderate Complexity: 1 Mod PT Treatments $Gait Training: 8-22 mins        Charlene Perry B. Migdalia Dk PT, DPT Acute Rehabilitation Services Pager [phone] Office [phone]   [place] 09/08/2019, 4:53 PM

## 2019-09-08 NOTE — Progress Notes (Signed)
PT Cancellation Note  Patient Details Name: Charlene Perry MRN: [id number] DOB: 03-23-1951   Cancelled Treatment:    Reason Eval/Treat Not Completed: (P) Patient at procedure or test/unavailable Pt is off floor receiving HD. PT will follow back for Evaluation this afternoon as able.  Payslee Bateson B. Migdalia Dk PT, DPT Acute Rehabilitation Services Pager [phone] Office [phone]    [place] 09/08/2019, 8:20 AM

## 2019-09-08 NOTE — Procedures (Signed)
I was present at this dialysis session. I have reviewed the session itself and made appropriate changes.   R IJ TDC yesterday with IR.  No UOP recorded yesterday.  SCr changed 5.1 to 7.2 in past 24h, K 4.1.  No indication for GFR recovery currently.  Outpt CLIP in process.     Filed Weights   09/07/19 0615 09/08/19 0338 09/08/19 0650  Weight: 105 kg 106.4 kg 108.4 kg    Recent Labs  Lab 09/08/19 0346  NA 139  K 4.1  CL 104  CO2 24  GLUCOSE 111*  BUN 25*  CREATININE 7.17*  CALCIUM 8.2*  PHOS 5.8*    Recent Labs  Lab 09/03/19 0430 09/07/19 0502 09/08/19 0346  WBC 9.0 5.1 4.9  NEUTROABS 7.0  --   --   HGB 10.2* 8.0* 8.0*  HCT 32.4* 24.6* 25.5*  MCV 91.3 87.2 90.4  PLT 241 191 237    Scheduled Meds: . carvedilol  12.5 mg Oral BID WC  . Chlorhexidine Gluconate Cloth  6 each Topical Daily  . heparin  5,000 Units Subcutaneous Q8H  . insulin aspart  0-9 Units Subcutaneous TID WC  . letrozole  2.5 mg Oral Daily  . levothyroxine  125 mcg Oral QAC breakfast  . sodium chloride flush  3 mL Intravenous Q12H   Continuous Infusions: . sodium chloride 40 mL/hr at 09/07/19 0515  . cefTRIAXone (ROCEPHIN)  IV 1 g (09/07/19 1930)   PRN Meds:.acetaminophen, alteplase, heparin, ondansetron (ZOFRAN) IV, oxyCODONE-acetaminophen, sodium chloride flush   Pearson Grippe  MD 09/08/2019, 8:50 AM

## 2019-09-08 NOTE — Progress Notes (Signed)
Patient states she was not feeling well yesterday after tunneled cath procedure, but able to talk briefly. She states she, nor her husband, have had an opportunity to call her insurance company to discuss what coverage she has for AKI OP HD, as she had previously requested time to do. Renal Navigator suggests that the referral be made and we see what insurance says. She agrees and would like referral to be made to [facility], as she would like to remain with [facility], who have provided her Nephrology care while hospitalized. She understands there are closer clinics to her home. She states her husband will transport her to [place] 3 times per week, and this is not an issue, however, she does request 2nd shift if possible.  Referral submitted to [facility] to request treatment at [facility]. Renal Navigator will follow closely.  Alphonzo Cruise, [place] Renal Navigator [phone]

## 2019-09-08 NOTE — Progress Notes (Signed)
Report given to dialysis.  Glucose lab was 111.  Transport leaving with patient currently.  Telemetry called to notify them of patient going to dialysis.

## 2019-09-08 NOTE — Progress Notes (Addendum)
PROGRESS NOTE                                                                                                                                                                                                             Patient Demographics:    Charlene Perry, is a 69 y.o. female, DOB - 11/20/1950, EXB:[id number]  Outpatient Primary MD for the patient is [name], MD   Admit date - 09/02/2019   LOS - 6  Chief Complaint  Patient presents with  . Emesis  . Diarrhea       Brief Narrative: Patient is a 69 y.o. female with PMHx of breast cancer-s/p mastectomy and then chemo/radiation in 1027, chronic diastolic heart failure, hypothyroidism, HTN, DM-2 presented to the ED with several days history of nausea, vomiting, diarrhea-she was found to have sepsis secondary to UTI along with AKI.  Hospital course complicated by worsening AKI requiring CRRT.  Transfer to [facility] on 3/22 for intermittent HD.  See below for further details.  Significant Events: 3/19>> CT renal stone study>> mild left hydronephrosis, bladder wall thickening with vesicular stranding 3/20>> admit to [facility] for severe sepsis and AKI. 3/22>> transferred to [facility]  Antibiotics: Rocephin: 3/19>>  Microbiology data: Blood cultures: 3/19>> no growth Urine culture: 3/19>> diphtheroids  Procedures: 3/20 R IJ HD cath > accidentally pulled out 3/21 R Femoral line CVL 3/24>> right IJ tunneled hemodialysis catheter placement by IR.  Consults: PCCM Nephrology    Subjective:   Seen this afternoon-lying comfortably in bed.  Denies any chest pain or shortness of breath.   Assessment  & Plan :   Acute renal failure: Suspect ATN due to volume depletion/ARB/Lasix.  CRRT started at [facility] transferred to [facility] for intermittent HD.  Nephrology continues to follow-she unfortunately remains an uric.  Severe sepsis from complicated  UTI/pyelonephritis: Continue Rocephin-unfortunately urine cultures growing diphtheroids which is likely a contamination.  We will plan on at least 7-10 days of antimicrobial therapy given severity of illness on admission.  Anemia: Secondary to acute illness-no evidence of blood loss-follow for now.  Transfuse if<7  HTN: Controlled-continue Coreg.  Losartan on hold due to AKI.  DM-2 (A1c 7.4 on 09/03/2019): CBG stable-continue SSI.  Metformin on hold due to AKI.  Recent Labs    09/07/19 1600 09/07/19 2110 09/08/19 1146  GLUCAP 138* 128* 85  Hypothyroidism: Continue Synthroid  HLD: Resume statins when closer to discharge.  Recent history of COVID-19 infection (positive in Jan): No indication for isolation or treatment.  History of breast cancer s/p mastectomy-chemo/radiation in 2020-continue letrozole  9 mm solid pleural based right middle lobe: seen incidentally on CT renal stone study-needs outpatient workup given hx of cancer  Obesity: Estimated body mass index is 38.57 kg/m as calculated from the following:   Height as of this encounter: 5\' 6"  (1.676 m).   Weight as of this encounter: 108.4 kg.    ABG: No results found for: PHART, PCO2ART, PO2ART, HCO3, TCO2, ACIDBASEDEF, O2SAT  Vent Settings: N/A    Condition - Guarded  Family Communication  :  Spouse updated at bedside  Code Status :  Full Code  Diet :  Diet Order            Diet heart healthy/carb modified Room service appropriate? Yes; Fluid consistency: Thin  Diet effective now               Disposition Plan  :  Remain hospitalized-probably  Barriers to discharge: AKI requiring renal replacement therapy  Antimicorbials  :    Anti-infectives (From admission, onward)   Start     Dose/Rate Route Frequency Ordered Stop   09/03/19 2030  cefTRIAXone (ROCEPHIN) 1 g in sodium chloride 0.9 % 100 mL IVPB     1 g 200 mL/hr over 30 Minutes Intravenous Every 24 hours 09/03/19 0057 09/08/19 2359   09/02/19  2030  cefTRIAXone (ROCEPHIN) 1 g in sodium chloride 0.9 % 100 mL IVPB     1 g 200 mL/hr over 30 Minutes Intravenous  Once 09/02/19 2022 09/02/19 2153      Inpatient Medications  Scheduled Meds: . carvedilol  12.5 mg Oral BID WC  . Chlorhexidine Gluconate Cloth  6 each Topical Daily  . heparin  5,000 Units Subcutaneous Q8H  . insulin aspart  0-9 Units Subcutaneous TID WC  . letrozole  2.5 mg Oral Daily  . levothyroxine  125 mcg Oral QAC breakfast  . sodium chloride flush  3 mL Intravenous Q12H   Continuous Infusions: . sodium chloride 40 mL/hr at 09/08/19 1156  . cefTRIAXone (ROCEPHIN)  IV 1 g (09/07/19 1930)   PRN Meds:.acetaminophen, alteplase, ondansetron (ZOFRAN) IV, oxyCODONE-acetaminophen, sodium chloride flush   Time Spent in minutes 25  See all Orders from today for further details   Oren Binet M.D on 09/08/2019 at 1:22 PM  To page go to [email or web address] - use universal password  [facility] -  Office  [phone]    Objective:   Vitals:   09/08/19 1030 09/08/19 1100 09/08/19 1103 09/08/19 1135  BP: (!) 142/69 138/72 (!) 144/72 (!) 143/61  Pulse: 89 87 82 92  Resp:   (!) 21 20  Temp:   98.5 F (36.9 C) 99 F (37.2 C)  TempSrc:   Oral Oral  SpO2:   97% 97%  Weight:      Height:        Wt Readings from Last 3 Encounters:  09/08/19 108.4 kg  03/07/19 102.4 kg  12/30/18 101.2 kg     Intake/Output Summary (Last 24 hours) at 09/08/2019 1322 Last data filed at 09/08/2019 1135 Gross per 24 hour  Intake 1059.75 ml  Output 250 ml  Net 809.75 ml     Physical Exam Gen Exam:Alert awake-not in any distress HEENT:atraumatic, normocephalic Chest: B/L clear to auscultation anteriorly CVS:S1S2 regular Abdomen:soft non tender, non distended Extremities:no  edema Neurology: Non focal Skin: no rash   Data Review:    CBC Recent Labs  Lab 09/02/19 1758 09/03/19 0430 09/07/19 0502 09/08/19 0346  WBC 8.2 9.0 5.1 4.9  HGB 11.4* 10.2* 8.0*  8.0*  HCT 35.9* 32.4* 24.6* 25.5*  PLT 253 241 191 237  MCV 91.3 91.3 87.2 90.4  MCH 29.0 28.7 28.4 28.4  MCHC 31.8 31.5 32.5 31.4  RDW 14.3 14.2 14.3 14.4  LYMPHSABS  --  0.8  --   --   MONOABS  --  1.0  --   --   EOSABS  --  0.0  --   --   BASOSABS  --  0.0  --   --     Chemistries  Recent Labs  Lab 09/02/19 1758 09/02/19 1758 09/03/19 0430 09/03/19 1151 09/04/19 0508 09/04/19 0810 09/04/19 1600 09/04/19 1600 09/05/19 0546 09/06/19 0858 09/07/19 0502 09/08/19 0346  NA 136   < > 136   < >   < >  --  136  --  136 139 138 139  K 5.3*   < > 5.7*   < >   < >  --  4.5  --  4.1 4.0 3.6 4.1  CL 97*   < > 98   < >   < >  --  98  --  102 105 104 104  CO2 18*   < > 17*   < >   < >  --  25  --  25 23 27 24   GLUCOSE 97   < > 71   < >   < >  --  176*  --  102* 111* 119* 111*  BUN 67*   < > 68*   < >   < >  --  58*  --  38* 33* 15 25*  CREATININE 6.80*   < > 7.13*   < >   < >  --  6.29*  --  4.67* 6.35* 5.12* 7.17*  CALCIUM 9.3   < > 8.4*   < >   < >  --  8.1*   < > 7.8* 8.0* 7.8*  7.8* 8.2*  MG  --   --   --   --   --  2.1  --   --  2.3 2.4  --   --   AST 22  --  25  --   --   --   --   --   --   --   --   --   ALT 16  --  18  --   --   --   --   --   --   --   --   --   ALKPHOS 73  --  66  --   --   --   --   --   --   --   --   --   BILITOT 0.3  --  0.6  --   --   --   --   --   --   --   --   --    < > = values in this interval not displayed.   ------------------------------------------------------------------------------------------------------------------ No results for input(s): CHOL, HDL, LDLCALC, TRIG, CHOLHDL, LDLDIRECT in the last 72 hours.  Lab Results  Component Value Date   HGBA1C 7.4 (H) 09/03/2019   ------------------------------------------------------------------------------------------------------------------ No results for input(s): TSH, T4TOTAL, T3FREE, THYROIDAB in the last  72 hours.  Invalid input(s):  FREET3 ------------------------------------------------------------------------------------------------------------------ Recent Labs    09/07/19 0502  FERRITIN 64  TIBC 202*  IRON 44    Coagulation profile Recent Labs  Lab 09/02/19 2310  INR 1.2    No results for input(s): DDIMER in the last 72 hours.  Cardiac Enzymes No results for input(s): CKMB, TROPONINI, MYOGLOBIN in the last 168 hours.  Invalid input(s): CK ------------------------------------------------------------------------------------------------------------------ No results found for: BNP  Micro Results Recent Results (from the past 240 hour(s))  Urine culture     Status: Abnormal   Collection Time: 09/02/19  8:04 PM   Specimen: Urine, Random  Result Value Ref Range Status   Specimen Description URINE, RANDOM  Final   Special Requests   Final    NONE Performed at [facility] [address] [place], [place] [address]    Culture (A)  Final    >=100,000 COLONIES/mL DIPHTHEROIDS(CORYNEBACTERIUM SPECIES) Standardized susceptibility testing for this organism is not available.    Report Status 09/04/2019 FINAL  Final  Culture, blood (routine x 2)     Status: None   Collection Time: 09/02/19  8:09 PM   Specimen: BLOOD  Result Value Ref Range Status   Specimen Description   Final    BLOOD LEFT ANTECUBITAL Performed at [facility], [place] [address] [place], [place] [address]    Special Requests   Final    BOTTLES DRAWN AEROBIC AND ANAEROBIC Blood Culture adequate volume Performed at [facility] [address] [place], [place] [address]    Culture   Final    NO GROWTH 5 DAYS Performed at [facility], [place] [address], [place], [place] [address]    Report Status 09/07/2019 FINAL  Final  SARS CORONAVIRUS 2 (TAT 6-24 HRS) Nasopharyngeal Nasopharyngeal Swab     Status: Abnormal   Collection Time: 09/02/19 11:10 PM   Specimen: Nasopharyngeal Swab   Result Value Ref Range Status   SARS Coronavirus 2 POSITIVE (A) NEGATIVE Final    Comment: RESULT CALLED TO, READ BACK BY AND VERIFIED WITH: RN J TAOKINGTON @0640  09/03/19 BY S GEZAHEGN (NOTE) SARS-CoV-2 target nucleic acids are DETECTED. The SARS-CoV-2 RNA is generally detectable in upper and lower respiratory specimens during the acute phase of infection. Positive results are indicative of the presence of SARS-CoV-2 RNA. Clinical correlation with patient history and other diagnostic information is  necessary to determine patient infection status. Positive results do not rule out bacterial infection or co-infection with other viruses.  The expected result is Negative. Fact Sheet for Patients: SugarRoll.be Fact Sheet for Healthcare Providers: https://www.woods-mathews.com/ This test is not yet approved or cleared by the [place] FDA and  has been authorized for detection and/or diagnosis of SARS-CoV-2 by FDA under an Emergency Use Authorization (EUA). This EUA will remain  in effect (meaning this test can be used)  for the duration of the COVID-19 declaration under Section 564(b)(1) of the Act, 21 U.S.C. section 360bbb-3(b)(1), unless the authorization is terminated or revoked sooner. Performed at [facility], [place] [address], [place], [place] [address]   MRSA PCR Screening     Status: None   Collection Time: 09/03/19  1:37 PM   Specimen: Nasal Mucosa; Nasopharyngeal  Result Value Ref Range Status   MRSA by PCR NEGATIVE NEGATIVE Final    Comment:        The GeneXpert MRSA Assay (FDA approved for NASAL specimens only), is one component of a comprehensive  MRSA colonization surveillance program. It is not intended to diagnose MRSA infection nor to guide or monitor treatment for MRSA infections. Performed at [facility], [place] [address], [place], [place] [address]     Radiology Reports IR Fluoro Guide CV  Line Right  Result Date: 09/07/2019 CLINICAL DATA:  Renal failure. Femoral Trialysis catheter. Needs durable long-term venous access for hemodialysis EXAM: TUNNELED HEMODIALYSIS CATHETER PLACEMENT WITH ULTRASOUND AND FLUOROSCOPIC GUIDANCE TECHNIQUE: The procedure, risks, benefits, and alternatives were explained to the patient. Questions regarding the procedure were encouraged and answered. The patient understands and consents to the procedure. Patient was already receiving adequate prophylactic antibiotic coverage as an inpatient. Patency of the right IJ vein was confirmed with ultrasound with image documentation. An appropriate skin site was determined. Region was prepped using maximum barrier technique including cap and mask, sterile gown, sterile gloves, large sterile sheet, and Chlorhexidine as cutaneous antisepsis. The region was infiltrated locally with 1% lidocaine. Intravenous Fentanyl 18mcg and Versed 1.5mg  were administered as conscious sedation during continuous monitoring of the patient's level of consciousness and physiological / cardiorespiratory status by the radiology RN, with a total moderate sedation time of 16 minutes. Under real-time ultrasound guidance, the right IJ vein was accessed with a 21 gauge micropuncture needle; the needle tip within the vein was confirmed with ultrasound image documentation. Needle exchanged over the 018 guidewire for transitional dilator, which allowed advancement of a Benson wire into the IVC. Over this, an MPA catheter was advanced. A Palindrome 23 hemodialysis catheter was tunneled from the right anterior chest wall approach to the right IJ dermatotomy site. The MPA catheter was exchanged over an Amplatz wire for serial vascular dilators which allow placement of a peel-away sheath, through which the catheter was advanced under intermittent fluoroscopy, positioned with its tips in the proximal and midright atrium. Spot chest radiograph confirms good catheter  position. No pneumothorax. Catheter was flushed and primed per protocol. Catheter secured externally with O Prolene sutures. The right IJ dermatotomy site was closed with Dermabond. COMPLICATIONS: COMPLICATIONS None immediate FLUOROSCOPY TIME:  30 seconds; 6 mGy COMPARISON:  None IMPRESSION: 1. Technically successful placement of tunneled right IJ hemodialysis catheter with ultrasound and fluoroscopic guidance. Ready for routine use. ACCESS: Remains approachable for percutaneous intervention as needed. Electronically Signed   By: Lucrezia Europe M.D.   On: 09/07/2019 10:45   IR US Guide Vasc Access Right  Result Date: 09/07/2019 CLINICAL DATA:  Renal failure. Femoral Trialysis catheter. Needs durable long-term venous access for hemodialysis EXAM: TUNNELED HEMODIALYSIS CATHETER PLACEMENT WITH ULTRASOUND AND FLUOROSCOPIC GUIDANCE TECHNIQUE: The procedure, risks, benefits, and alternatives were explained to the patient. Questions regarding the procedure were encouraged and answered. The patient understands and consents to the procedure. Patient was already receiving adequate prophylactic antibiotic coverage as an inpatient. Patency of the right IJ vein was confirmed with ultrasound with image documentation. An appropriate skin site was determined. Region was prepped using maximum barrier technique including cap and mask, sterile gown, sterile gloves, large sterile sheet, and Chlorhexidine as cutaneous antisepsis. The region was infiltrated locally with 1% lidocaine. Intravenous Fentanyl 68mcg and Versed 1.5mg  were administered as conscious sedation during continuous monitoring of the patient's level of consciousness and physiological / cardiorespiratory status by the radiology RN, with a total moderate sedation time of 16 minutes. Under real-time ultrasound guidance, the right IJ vein was accessed with a 21 gauge micropuncture needle; the needle tip within the vein was confirmed with ultrasound image documentation.  Needle exchanged  over the 018 guidewire for transitional dilator, which allowed advancement of a Benson wire into the IVC. Over this, an MPA catheter was advanced. A Palindrome 23 hemodialysis catheter was tunneled from the right anterior chest wall approach to the right IJ dermatotomy site. The MPA catheter was exchanged over an Amplatz wire for serial vascular dilators which allow placement of a peel-away sheath, through which the catheter was advanced under intermittent fluoroscopy, positioned with its tips in the proximal and midright atrium. Spot chest radiograph confirms good catheter position. No pneumothorax. Catheter was flushed and primed per protocol. Catheter secured externally with O Prolene sutures. The right IJ dermatotomy site was closed with Dermabond. COMPLICATIONS: COMPLICATIONS None immediate FLUOROSCOPY TIME:  30 seconds; 6 mGy COMPARISON:  None IMPRESSION: 1. Technically successful placement of tunneled right IJ hemodialysis catheter with ultrasound and fluoroscopic guidance. Ready for routine use. ACCESS: Remains approachable for percutaneous intervention as needed. Electronically Signed   By: Lucrezia Europe M.D.   On: 09/07/2019 10:45   DG Bone Density  Result Date: 08/25/2019 EXAM: DUAL X-RAY ABSORPTIOMETRY (DXA) FOR BONE MINERAL DENSITY IMPRESSION: Referring Physician:  Gardenia Phlegm Your patient completed a BMD test using Lunar IDXA DXA system ( analysis version: 16 ) manufactured by [employer]. Technologist: AW PATIENT: Name: Aireona, Torelli Patient ID: [id number] Birth Date: Jul 09, 1950 Height: 66.0 in. Sex: Female Measured: 08/25/2019 Weight: 224.6 lbs. Indications: Breast Cancer History, Diabetic non insulin, Estrogen Deficient, Gabapentin, Hypothyroid, Hysterectomy, Letrozole, Levothyroxine Fractures: None Treatments: None ASSESSMENT: The BMD measured at Femur Neck Left is 0.987 g/cm2 with a T-score of -0.4. This patient is considered normal according to [facility]) criteria. The scan quality is good. L-3 and L-4 were excluded due to degenerative changes. Site Region Measured Date Measured Age [age]A BMD Significant CHANGE T-score DualFemur Neck Left  10/25/[age]    66.8         -0.4    0.987 g/cm2 AP Spine  L1-L2      10/25[age]2019    66.8         0.8     1.267 g/cm2 DualFemur Total Mean 10/25/2019    66.8         0.2     1.032 g/cm2 World Health Organization [facility]) criteria for post-menopausal, Caucasian Women: Normal       T-score at or above -1 SD Osteopenia   T-score between -1 and -2.5 SD Osteoporosis T-score at or below -2.5 SD RECOMMENDATION: 1. All patients should optimize calcium and vitamin D intake. 2. Consider FDA approved medical therapies in postmenopausal women and men aged 83 years and older, based on the following: a. A hip or vertebral (clinical or morphometric) fracture b. T- score < or = -2.5 at the femoral neck or spine after appropriate evaluation to exclude secondary causes c. Low bone mass (T-score between -1.0 and -2.5 at the femoral neck or spine) and a 10 year probability of a hip fracture > or = 3% or a 10 year probability of a major osteoporosis-related fracture > or = 20% based on the [place]-adapted WHO algorithm d. Clinician judgment and/or patient preferences may indicate treatment for people with 10-year fracture probabilities above or below these levels FOLLOW-UP: Patients with diagnosis of osteoporosis or at high risk for fracture should have regular bone mineral density tests. For patients eligible for Medicare, routine testing is allowed once every 2 years. The testing frequency can be increased to one year for patients who have rapidly progressing disease,  those who are receiving or discontinuing medical therapy to restore bone mass, or have additional risk factors. I have reviewed this report and agree with the above findings. [facility] Radiology Electronically Signed   By: Lowella Grip III M.D.   On: 08/25/2019 16:05   DG  CHEST PORT 1 VIEW  Result Date: 09/03/2019 CLINICAL DATA:  Dialysis catheter insertion. EXAM: PORTABLE CHEST 1 VIEW COMPARISON:  September 03, 2019. FINDINGS: The heart size and mediastinal contours are within normal limits. No pneumothorax or pleural effusion is noted. Left lung is clear. Mildly improved right lung opacities are noted. Left internal jugular Port-A-Cath is unchanged in position. Interval placement of right internal jugular catheter with distal tip in expected position of the SVC. The visualized skeletal structures are unremarkable. IMPRESSION: Interval placement of right internal jugular catheter with distal tip in expected position of the SVC. No pneumothorax is noted. Mildly improved right lung opacities are noted. Electronically Signed   By: Marijo Conception M.D.   On: 09/03/2019 15:43   DG CHEST PORT 1 VIEW  Result Date: 09/03/2019 CLINICAL DATA:  Shortness of breath. EXAM: PORTABLE CHEST 1 VIEW COMPARISON:  August 19, 2018. FINDINGS: The heart size and mediastinal contours are within normal limits. No pneumothorax or pleural effusion is noted. Left lung is clear. Left internal jugular Port-A-Cath is unchanged in position. Interval development of right upper and lower lobe airspace opacities are noted concerning for possible pneumonia. The visualized skeletal structures are unremarkable. IMPRESSION: Interval development of right upper and lower lobe airspace opacities concerning for possible pneumonia. Follow-up radiographs are recommended to ensure resolution. Electronically Signed   By: Marijo Conception M.D.   On: 09/03/2019 08:06   MM SCREENING BREAST TOMO UNI L  Result Date: 08/26/2019 CLINICAL DATA:  Screening. EXAM: DIGITAL SCREENING UNILATERAL LEFT MAMMOGRAM WITH CAD AND TOMO COMPARISON:  Previous exam(s). ACR Breast Density Category c: The breast tissue is heterogeneously dense, which may obscure small masses. FINDINGS: There are no findings suspicious for malignancy. Images were  processed with CAD. IMPRESSION: No mammographic evidence of malignancy. A result letter of this screening mammogram will be mailed directly to the patient. RECOMMENDATION: Screening mammogram in one year. (Code:SM-B-01Y) BI-RADS CATEGORY  1: Negative. Electronically Signed   By: Franki Cabot M.D.   On: 08/26/2019 12:36   CT Renal Stone Study  Result Date: 09/02/2019 CLINICAL DATA:  Flank pain. EXAM: CT ABDOMEN AND PELVIS WITHOUT CONTRAST TECHNIQUE: Multidetector CT imaging of the abdomen and pelvis was performed following the standard protocol without IV contrast. COMPARISON:  March 15, 2018 FINDINGS: Lower chest: A focal 9 mm solid pleural based nodularity in the right middle lobe on axial series 2, image 7 new since September 2019 with adjacent streaky consolidation in the right middle lobe, possibly aspiration related. Follow-up imaging is indicated to exclude an underlying metastatic nodule given postoperative changes from right mastectomy. Normal heart size. Mild mitral valvular calcification. Hepatobiliary: Cholecystectomy. Mild hepatomegaly. Benign hepatic cysts, similarly sized; no further imaging characterization recommended. Chronic 10 mm dilatation of the proximal common bile duct with normal distal tapering and no intrahepatic biliary duct dilatation. Pancreas: Normal. Spleen: Normal. Adrenals/Urinary Tract: Stable 1.8 cm benign adrenal adenoma measuring 10 Hounsfield units. Normal right adrenal gland. Bilateral moderate perinephric inflammatory changes, slightly increased. Nonobstructing right renal punctate calculus. Mild left hydronephrosis without an obstructing renal or ureteral calculus. Decompressed bilateral ureters. Diffusely thickened urinary bladder with perivesicular stranding. Stomach/Bowel: Mild nonspecific bowel wall thickening of the duodenum and left  upper abdominal quadrant jejunum without perforation or free fluid. Mild adjacent inflammatory change. Reactive appearing lymph  nodes in the associated mesenteric root. Nonobstructed. No colonic or gastric mucosal thickening. Small hiatal hernia. Normal appendix, axial series 2, image 67. Mild diverticulosis coli in the sigmoid and proximal transverse colon. Vascular/Lymphatic: Aortic calcified atherosclerosis. No abdominal aortic aneurysm. No adenopathy. Reproductive: Atrophic or surgically absent uterus. No adnexal cyst or mass. Other: Midline infraumbilical hernia containing non-strangulated, non incarcerated and nonobstructed small bowel loops. Small uncomplicated periumbilical herniation of fat. Musculoskeletal: Moderate degenerative change. Probable benign bone islands in the left hip. A sternal foramen. I discussed critical results and additional imaging recommendations by telephone at the time of interpretation on 09/02/2019 at 9:30 p.m. with provider Davonna Belling , who verbally acknowledged these results. IMPRESSION: Mild left hydronephrosis and diffuse circumferential wall thickening of the urinary bladder with mild perivesicular stranding. Findings support a clinical diagnosis of an acute cystitis with upper urinary tract infection. Bilateral moderate perinephric stranding could represent infection or medical renal disease. No obstructing renal or ureteral calculus. Punctate nonobstructing right renal calculus. A 9 mm solid pleural based right middle lobe pulmonary nodularity with adjacent streaky consolidation, which could be aspiration-related. Outpatient unenhanced CT chest recommended with surveillance CT Chest in 3 months should this nodularity persist. A metastatic or malignant pulmonary nodule are differential considerations, particularly given the history of right mastectomy. Mild enteritis, consistent with the patient history of nausea and vomiting. Hepatomegaly and stable benign hepatic cysts for which no further imaging characterization or surveillance is recommended. Uncomplicated infraumbilical abdominal wall  herniation of small bowel loops without strangulation, incarceration or obstruction. Stable size of a small left adrenal benign adenoma. Electronically Signed   By: Revonda Humphrey   On: 09/02/2019 21:31

## 2019-09-09 DIAGNOSIS — L299 Pruritus, unspecified: Secondary | ICD-10-CM | POA: Insufficient documentation

## 2019-09-09 DIAGNOSIS — N2581 Secondary hyperparathyroidism of renal origin: Secondary | ICD-10-CM | POA: Insufficient documentation

## 2019-09-09 DIAGNOSIS — R197 Diarrhea, unspecified: Secondary | ICD-10-CM | POA: Insufficient documentation

## 2019-09-09 DIAGNOSIS — D649 Anemia, unspecified: Secondary | ICD-10-CM | POA: Insufficient documentation

## 2019-09-09 DIAGNOSIS — R52 Pain, unspecified: Secondary | ICD-10-CM | POA: Insufficient documentation

## 2019-09-09 DIAGNOSIS — D509 Iron deficiency anemia, unspecified: Secondary | ICD-10-CM | POA: Insufficient documentation

## 2019-09-09 DIAGNOSIS — G4733 Obstructive sleep apnea (adult) (pediatric): Secondary | ICD-10-CM | POA: Insufficient documentation

## 2019-09-09 DIAGNOSIS — I10 Essential (primary) hypertension: Secondary | ICD-10-CM | POA: Insufficient documentation

## 2019-09-09 DIAGNOSIS — D689 Coagulation defect, unspecified: Secondary | ICD-10-CM | POA: Insufficient documentation

## 2019-09-09 DIAGNOSIS — R0600 Dyspnea, unspecified: Secondary | ICD-10-CM | POA: Insufficient documentation

## 2019-09-09 LAB — RENAL FUNCTION PANEL
Albumin: 2.5 g/dL — ABNORMAL LOW (ref 3.5–5.0)
Anion gap: 11 (ref 5–15)
BUN: 14 mg/dL (ref 8–23)
CO2: 27 mmol/L (ref 22–32)
Calcium: 8.2 mg/dL — ABNORMAL LOW (ref 8.9–10.3)
Chloride: 101 mmol/L (ref 98–111)
Creatinine, Ser: 5.07 mg/dL — ABNORMAL HIGH (ref 0.44–1.00)
GFR calc Af Amer: 10 mL/min — ABNORMAL LOW (ref >60)
GFR calc non Af Amer: 8 mL/min — ABNORMAL LOW (ref >60)
Glucose, Bld: 93 mg/dL (ref 70–99)
Phosphorus: 4.9 mg/dL — ABNORMAL HIGH (ref 2.5–4.6)
Potassium: 4 mmol/L (ref 3.5–5.1)
Sodium: 139 mmol/L (ref 135–145)

## 2019-09-09 LAB — GLUCOSE, CAPILLARY
Glucose-Capillary: 117 mg/dL — ABNORMAL HIGH (ref 70–99)
Glucose-Capillary: 95 mg/dL (ref 70–99)

## 2019-09-09 MED ORDER — HEPARIN SOD (PORK) LOCK FLUSH 100 UNIT/ML IV SOLN
500.0000 [IU] | INTRAVENOUS | Status: AC | PRN
  Administered 2019-09-09: 500 [IU]
  Filled 2019-09-09: qty 5

## 2019-09-09 MED ORDER — CARVEDILOL 12.5 MG PO TABS: 12.5000 mg | ORAL_TABLET | Freq: Two times a day (BID) | ORAL | 0 refills | Status: DC

## 2019-09-09 NOTE — Progress Notes (Signed)
Occupational Therapy Treatment Patient Details Name: Charlene Perry MRN: [id number] DOB: 05-04-1951 Today's Date: 09/09/2019    History of present illness Patient is a 69 y.o. female with PMHx of breast cancer-s/p mastectomy and then chemo/radiation in 4332, chronic diastolic heart failure, hypothyroidism, HTN, DM-2 presented to the ED with several days history of nausea, vomiting, diarrhea-she was found to have sepsis secondary to UTI along with AKI.  Hospital course complicated by worsening AKI requiring CRRT.  Transfer to [facility] on 3/22 for intermittent HD.    OT comments  Pt received in bed, agreeable to participate in therapy as she is feeling better today. Pt Modified Independent for bed mobility, Independent in sit to stand at bedside. Pt Setup/supervision to don socks sitting EOB, setup to don hospital gown around back in standing. Pt Supervision for hallway distance mobility pushing IV pole. Pt intermittently used rails in hallway for support, but no LOB noted. Pt reports plan to use RW at home initially for better support on feet. Pt also reported she can use day bed on first floor, where there is also a kitchen and full bath, rather than using stairs initially. OT educated this would be a good idea, as HD typically makes pt lethargic. Recommend HHOT/PT at DC to maximize safety and stability in the home.    Follow Up Recommendations  Home health OT;Supervision - Intermittent    Equipment Recommendations  None recommended by OT    Recommendations for Other Services      Precautions / Restrictions Precautions Precautions: Fall Restrictions Weight Bearing Restrictions: No       Mobility Bed Mobility Overal bed mobility: Modified Independent             General bed mobility comments: Modified Independent with increased time, HOB elevated and use of bedrails prn  Transfers Overall transfer level: Needs assistance Equipment used: None Transfers: Sit to/from  [facility] Sit to Stand: Independent Stand pivot transfers: Supervision       General transfer comment: Supervision for safety, mobility in hallway with IV pole Supervision, no LOB    Balance Overall balance assessment: No apparent balance deficits (not formally assessed)                                         ADL either performed or assessed with clinical judgement   ADL                   Upper Body Dressing : Set up;Standing   Lower Body Dressing: Supervision/safety;Set up;Sitting/lateral leans Lower Body Dressing Details (indicate cue type and reason): Setup to don socks sitting EOB today                     Vision       Perception     Praxis      Cognition Arousal/Alertness: Awake/alert Behavior During Therapy: WFL for tasks assessed/performed Overall Cognitive Status: Within Functional Limits for tasks assessed                                          Exercises     Shoulder Instructions       General Comments VSS on RA    Pertinent Vitals/ Pain       Pain Assessment:  Faces Faces Pain Scale: Hurts a little bit Pain Location: at chest port site Pain Descriptors / Indicators: Aching;Discomfort;Sore Pain Intervention(s): Monitored during session;Limited activity within patient's tolerance  Home Living                                          Prior Functioning/Environment              Frequency  Min 3X/week        Progress Toward Goals  OT Goals(current goals can now be found in the care plan section)  Progress towards OT goals: Progressing toward goals  Acute Rehab OT Goals Patient Stated Goal: go home, have no need to use DME OT Goal Formulation: With patient Time For Goal Achievement: 09/21/19 Potential to Achieve Goals: Good ADL Goals Pt Will Perform Grooming: Independently;standing Pt Will Perform Lower Body Dressing: Independently;sit to/from  stand;sitting/lateral leans Pt Will Transfer to Toilet: Independently;ambulating;regular height toilet Pt Will Perform Toileting - Clothing Manipulation and hygiene: Independently;sitting/lateral leans;sit to/from stand  Plan Discharge plan remains appropriate    Co-evaluation                 AM-PAC OT "6 Clicks" Daily Activity     Outcome Measure   Help from another person eating meals?: None Help from another person taking care of personal grooming?: None Help from another person toileting, which includes using toliet, bedpan, or urinal?: A Little Help from another person bathing (including washing, rinsing, drying)?: A Little Help from another person to put on and taking off regular upper body clothing?: A Little Help from another person to put on and taking off regular lower body clothing?: A Little 6 Click Score: 20    End of Session    OT Visit Diagnosis: Other abnormalities of gait and mobility (R26.89);Muscle weakness (generalized) (M62.81)   Activity Tolerance Patient tolerated treatment well   Patient Left in bed;with call bell/phone within reach;Other (comment)(with MD)   Nurse Communication Mobility status;Other (comment)(DC plans)        Time: 1751-0258 OT Time Calculation (min): 36 min  Charges: OT General Charges $OT Visit: 1 Visit OT Treatments $Self Care/Home Management : 8-22 mins $Therapeutic Activity: 8-22 mins  Layla Maw, OTR/L   Layla Maw 09/09/2019, 2:29 PM

## 2019-09-09 NOTE — TOC Transition Note (Addendum)
Transition of Care [facility]) - CM/SW Discharge Note   Patient Details  Name: Charlene Perry MRN: [id number] Date of Birth: 1951-03-04  [facility] CM/SW Contact:  Maryclare Labrador, RN Phone Number: 09/09/2019, 11:11 AM   Clinical Narrative:   Pt deemed stable to discharge home today.  Pt informed CM that she is aware of the outpt HD center and denied barriers with transportation.  Pt is in interested in [facility] and DME as recommended.   Medicare.gov HH choice given - pt has no preference.  [facility] accepts for [facility] and [facility] accepts for DME.  Both agencies informed that pt has discharge orders.  Pt confirms she has a PCP and denied barriers with paying for discharge medications.  No other CM needs determined - CM signing off  Update:  Pt has an appt at [place] has not yet arrived from [facility] to floor.  Per bedside nurse pt can safely manage until walker delivered to her home.   Pt chooses to leave hospital to make appt and have [facility] deliver equipment to home - [facility] in agreement.      Final next level of care: [place] Barriers to Discharge: Barriers Resolved   Patient Goals and CMS Choice Patient states their goals for this hospitalization and ongoing recovery are:: Pt states she is ready to get back to her bed CMS Medicare.gov Compare Post Acute Care list provided to:: Patient Choice offered to / list presented to : Patient  Discharge Placement                       Discharge Plan and Services                DME Arranged: Walker rolling DME Agency: [facility] Date DME Agency Contacted: 09/09/19 Time DME Agency Contacted: [phone] Representative spoke with at DME Agency: [place]: RN, PT, OT [facility] Agency: [place] Date [place]: 09/09/19 Time [place]: 1111 Representative spoke with at [place]: [place] ([place]) Interventions     Readmission Risk  Interventions No flowsheet data found.

## 2019-09-09 NOTE — Progress Notes (Signed)
Patient decided to have rolling walker delivered to house instead of delivered to the hospital.

## 2019-09-09 NOTE — Discharge Summary (Addendum)
PATIENT DETAILS Name: Charlene Perry Age: 69 y.o. Sex: female Date of Birth: 1950-10-06 MRN: [id number]. Admitting Physician: Vianne Bulls, MD [id number], [name], MD  Admit Date: 09/02/2019 Discharge date: 09/09/2019  Recommendations for Outpatient Follow-up:  1. Follow up with PCP in 1-2 weeks 2. Please obtain CMP/CBC in one week 3. Please ensure follow-up with nephrology 4. Outpatient CT chest-see below  Admitted From:  Home  Disposition: Home with home health services   [place]:  Yes  Equipment/Devices: None  Discharge Condition: Stable  CODE STATUS: FULL CODE  Diet recommendation:  Diet Order            Diet - low sodium heart healthy        Diet Carb Modified        Diet heart healthy/carb modified Room service appropriate? Yes; Fluid consistency: Thin  Diet effective now               Brief Narrative: Patient is a 69 y.o. female with PMHx of breast cancer-s/p mastectomy and then chemo/radiation in 8453, chronic diastolic heart failure, hypothyroidism, HTN, DM-2 presented to the ED with several days history of nausea, vomiting, diarrhea-she was found to have sepsis secondary to UTI along with AKI.  Hospital course complicated by worsening AKI requiring CRRT.  Transfer to [facility] on 3/22 for intermittent HD.  See below for further details.  Significant Events: 3/19>> CT renal stone study>> mild left hydronephrosis, bladder wall thickening with vesicular stranding 3/20>> admit to [facility] for severe sepsis and AKI. 3/22>> transferred to [facility]  Antibiotics: Rocephin: 3/19>>  Microbiology data: Blood cultures: 3/19>> no growth Urine culture: 3/19>> diphtheroids  Procedures: 3/20 R IJ HD cath > accidentally pulled out 3/21 R Femoral line CVL 3/24>> right IJ tunneled hemodialysis catheter placement by IR.  Consults: PCCM Nephrology  Brief Hospital Course: Acute renal failure: Suspect ATN due to volume depletion/ARB/Lasix.   CRRT started at [facility] transferred to [facility] for intermittent HD.    Followed closely by nephrology-she remained anuric-but overnight has made around 250 cc of urine.  Spoke with Dr. Joelyn Oms today-nephrology will plan outpatient hemodialysis for acute kidney injury.  He will ensure further follow-up-patient will be discharged with right tunneled IJ catheter.  Further care will be deferred to nephrology in the outpatient setting.  Okay to discharge today to continue with HD in the outpatient center-Per Dr. Joelyn Oms.  Severe sepsis from complicated UTI/pyelonephritis:  Managed with Rocephin--unfortunately urine cultures growing diphtheroids which is likely a contamination.  Completed 7 days of IV antibiotics-she is stable-afebrile-do not think she requires any further antimicrobial therapy.  Anemia: Secondary to acute illness-no evidence of blood loss-follow for now.  Plans were to Transfuse if<7.  Continue to follow closely in the outpatient setting  HTN: Controlled-continue Coreg.  Losartan on hold due to AKI.  DM-2 (A1c 7.4 on 09/03/2019): CBG stable-continue SSI.  Metformin on hold due to AKI.  Discussed with patient's-she prefers observation and watching her sugars for now-I have asked her to call her primary care practitioner if her CBGs are persistently elevated more than 200.  Hypothyroidism: Continue Synthroid  HLD: Resume statins when closer to discharge.  Recent history of COVID-19 infection (positive in Jan): No indication for isolation or treatment.  History of breast cancer s/p mastectomy-chemo/radiation in 2020-continue letrozole  9 mm solid pleural based right middle lobe: seen incidentally on CT renal stone study-needs outpatient workup given hx of cancer  Obesity: Estimated body mass index is 38.39 kg/m as calculated  from the following:   Height as of this encounter: 5\' 6"  (1.676 m).   Weight as of this encounter: 107.9 kg.    Discharge Diagnoses:  Principal  Problem:   Acute kidney injury [facility]) Active Problems:   Breast cancer, right ([place])   Sepsis secondary to UTI ([place])   Diabetes mellitus type II, non insulin dependent ([place])   Lactic acidosis   Discharge Instructions:  Activity:  As tolerated with Full fall precautions use walker/cane & assistance as needed   Discharge Instructions    Call MD for:  difficulty breathing, headache or visual disturbances   Complete by: As directed    Call MD for:  extreme fatigue   Complete by: As directed    Call MD for:  persistant dizziness or light-headedness   Complete by: As directed    Call MD for:  persistant nausea and vomiting   Complete by: As directed    Call MD for:  redness, tenderness, or signs of infection (pain, swelling, redness, odor or green/yellow discharge around incision site)   Complete by: As directed    Call MD for:  severe uncontrolled pain   Complete by: As directed    Diet - low sodium heart healthy   Complete by: As directed    Diet Carb Modified   Complete by: As directed    Discharge instructions   Complete by: As directed    1.)  Please check your CBGs at least 2-3 times a day-if you are CBGs are persistently more than 200-please call your primary care practitioner.  2.)  Please do not take Metformin, losartan until cleared by your primary care practitioner or your nephrologist.  3.)  While you have kidney failure-please continue to avoid NSAIDs-medications like Motrin, ibuprofen, Advil and Aleve.  4.)  Before you start any over-the-counter medications or prescription medications-please touch base with your primary nephrologist or your primary care practitioner.  5.)  You were found to have a incidental lung nodule on CT scan of the abdomen-your primary care practitioner will need to perform a CT chest in 3 months.  Please discuss this finding with your primary care practitioner at next follow-up visit.  In some cases-these lung nodules can turn cancerous over the  course of time.   Follow with Primary MD  [name], MD in 1-2 weeks  Follow with the hemodialysis clinic as instructed  Please get a complete blood count and chemistry panel checked by your Primary MD at your next visit, and again as instructed by your Primary MD.  Get Medicines reviewed and adjusted: Please take all your medications with you for your next visit with your Primary MD  Laboratory/radiological data: Please request your Primary MD to go over all hospital tests and procedure/radiological results at the follow up, please ask your Primary MD to get all Hospital records sent to his/her office.  In some cases, they will be blood work, cultures and biopsy results pending at the time of your discharge. Please request that your primary care M.D. follows up on these results.  Also Note the following: If you experience worsening of your admission symptoms, develop shortness of breath, life threatening emergency, suicidal or homicidal thoughts you must seek medical attention immediately by calling 911 or calling your MD immediately  if symptoms less severe.  You must read complete instructions/literature along with all the possible adverse reactions/side effects for all the Medicines you take and that have been prescribed to you. Take any new Medicines after  you have completely understood and accpet all the possible adverse reactions/side effects.   Do not drive when taking Pain medications or sleeping medications (Benzodaizepines)  Do not take more than prescribed Pain, Sleep and Anxiety Medications. It is not advisable to combine anxiety,sleep and pain medications without talking with your primary care practitioner  Special Instructions: If you have smoked or chewed Tobacco  in the last 2 yrs please stop smoking, stop any regular Alcohol  and or any Recreational drug use.  Wear Seat belts while driving.  Please note: You were cared for by a hospitalist during your  hospital stay. Once you are discharged, your primary care physician will handle any further medical issues. Please note that NO REFILLS for any discharge medications will be authorized once you are discharged, as it is imperative that you return to your primary care physician (or establish a relationship with a primary care physician if you do not have one) for your post hospital discharge needs so that they can reassess your need for medications and monitor your lab values.   Increase activity slowly   Complete by: As directed      Allergies as of 09/09/2019   No Known Allergies     Medication List    STOP taking these medications   celecoxib 200 MG capsule Commonly known as: CELEBREX   ciprofloxacin 500 MG tablet Commonly known as: CIPRO   furosemide 40 MG tablet Commonly known as: LASIX   gabapentin 400 MG capsule Commonly known as: NEURONTIN   losartan 25 MG tablet Commonly known as: COZAAR   metFORMIN 1000 MG tablet Commonly known as: GLUCOPHAGE   potassium chloride 10 MEQ tablet Commonly known as: KLOR-CON     TAKE these medications   aspirin EC 81 MG tablet Take 81 mg by mouth daily.   carvedilol 12.5 MG tablet Commonly known as: COREG Take 1 tablet (12.5 mg total) by mouth 2 (two) times daily with a meal.   letrozole 2.5 MG tablet Commonly known as: FEMARA Take 1 tablet (2.5 mg total) by mouth daily.   levothyroxine 125 MCG tablet Commonly known as: SYNTHROID Take 125 mcg by mouth daily before breakfast.   rosuvastatin 40 MG tablet Commonly known as: CRESTOR Take 40 mg by mouth at bedtime.      Follow-up Information    [name], MD. Schedule an appointment as soon as possible for a visit in 1 week(s).   Specialty: Family Medicine Contact information: [place] [id number] [phone]        Hemodialysis clinic Follow up.   Why: Follow as instructed by renal navigator         No Known  Allergies   Other Procedures/Studies: IR Fluoro Guide CV Line Right  Result Date: 09/07/2019 CLINICAL DATA:  Renal failure. Femoral Trialysis catheter. Needs durable long-term venous access for hemodialysis EXAM: TUNNELED HEMODIALYSIS CATHETER PLACEMENT WITH ULTRASOUND AND FLUOROSCOPIC GUIDANCE TECHNIQUE: The procedure, risks, benefits, and alternatives were explained to the patient. Questions regarding the procedure were encouraged and answered. The patient understands and consents to the procedure. Patient was already receiving adequate prophylactic antibiotic coverage as an inpatient. Patency of the right IJ vein was confirmed with ultrasound with image documentation. An appropriate skin site was determined. Region was prepped using maximum barrier technique including cap and mask, sterile gown, sterile gloves, large sterile sheet, and Chlorhexidine as cutaneous antisepsis. The region was infiltrated locally with 1% lidocaine. Intravenous Fentanyl 27mcg and Versed 1.5mg  were administered as conscious  sedation during continuous monitoring of the patient's level of consciousness and physiological / cardiorespiratory status by the radiology RN, with a total moderate sedation time of 16 minutes. Under real-time ultrasound guidance, the right IJ vein was accessed with a 21 gauge micropuncture needle; the needle tip within the vein was confirmed with ultrasound image documentation. Needle exchanged over the 018 guidewire for transitional dilator, which allowed advancement of a Benson wire into the IVC. Over this, an MPA catheter was advanced. A Palindrome 23 hemodialysis catheter was tunneled from the right anterior chest wall approach to the right IJ dermatotomy site. The MPA catheter was exchanged over an Amplatz wire for serial vascular dilators which allow placement of a peel-away sheath, through which the catheter was advanced under intermittent fluoroscopy, positioned with its tips in the proximal and  midright atrium. Spot chest radiograph confirms good catheter position. No pneumothorax. Catheter was flushed and primed per protocol. Catheter secured externally with O Prolene sutures. The right IJ dermatotomy site was closed with Dermabond. COMPLICATIONS: COMPLICATIONS None immediate FLUOROSCOPY TIME:  30 seconds; 6 mGy COMPARISON:  None IMPRESSION: 1. Technically successful placement of tunneled right IJ hemodialysis catheter with ultrasound and fluoroscopic guidance. Ready for routine use. ACCESS: Remains approachable for percutaneous intervention as needed. Electronically Signed   By: Lucrezia Europe M.D.   On: 09/07/2019 10:45   IR US Guide Vasc Access Right  Result Date: 09/07/2019 CLINICAL DATA:  Renal failure. Femoral Trialysis catheter. Needs durable long-term venous access for hemodialysis EXAM: TUNNELED HEMODIALYSIS CATHETER PLACEMENT WITH ULTRASOUND AND FLUOROSCOPIC GUIDANCE TECHNIQUE: The procedure, risks, benefits, and alternatives were explained to the patient. Questions regarding the procedure were encouraged and answered. The patient understands and consents to the procedure. Patient was already receiving adequate prophylactic antibiotic coverage as an inpatient. Patency of the right IJ vein was confirmed with ultrasound with image documentation. An appropriate skin site was determined. Region was prepped using maximum barrier technique including cap and mask, sterile gown, sterile gloves, large sterile sheet, and Chlorhexidine as cutaneous antisepsis. The region was infiltrated locally with 1% lidocaine. Intravenous Fentanyl 41mcg and Versed 1.5mg  were administered as conscious sedation during continuous monitoring of the patient's level of consciousness and physiological / cardiorespiratory status by the radiology RN, with a total moderate sedation time of 16 minutes. Under real-time ultrasound guidance, the right IJ vein was accessed with a 21 gauge micropuncture needle; the needle tip within  the vein was confirmed with ultrasound image documentation. Needle exchanged over the 018 guidewire for transitional dilator, which allowed advancement of a Benson wire into the IVC. Over this, an MPA catheter was advanced. A Palindrome 23 hemodialysis catheter was tunneled from the right anterior chest wall approach to the right IJ dermatotomy site. The MPA catheter was exchanged over an Amplatz wire for serial vascular dilators which allow placement of a peel-away sheath, through which the catheter was advanced under intermittent fluoroscopy, positioned with its tips in the proximal and midright atrium. Spot chest radiograph confirms good catheter position. No pneumothorax. Catheter was flushed and primed per protocol. Catheter secured externally with O Prolene sutures. The right IJ dermatotomy site was closed with Dermabond. COMPLICATIONS: COMPLICATIONS None immediate FLUOROSCOPY TIME:  30 seconds; 6 mGy COMPARISON:  None IMPRESSION: 1. Technically successful placement of tunneled right IJ hemodialysis catheter with ultrasound and fluoroscopic guidance. Ready for routine use. ACCESS: Remains approachable for percutaneous intervention as needed. Electronically Signed   By: Lucrezia Europe M.D.   On: 09/07/2019 10:45   DG Bone  Density  Result Date: 08/25/2019 EXAM: DUAL X-RAY ABSORPTIOMETRY (DXA) FOR BONE MINERAL DENSITY IMPRESSION: Referring Physician:  Gardenia Phlegm Your patient completed a BMD test using Lunar IDXA DXA system ( analysis version: 16 ) manufactured by [employer]. Technologist: AW PATIENT: Name: Carlyle, Achenbach Patient ID: [id number] Birth Date: 1951-06-10 Height: 66.0 in. Sex: Female Measured: 08/25/2019 Weight: 224.6 lbs. Indications: Breast Cancer History, Diabetic non insulin, Estrogen Deficient, Gabapentin, Hypothyroid, Hysterectomy, Letrozole, Levothyroxine Fractures: None Treatments: None ASSESSMENT: The BMD measured at Femur Neck Left is 0.987 g/cm2 with a T-score of -0.4.  This patient is considered normal according to [facility]) criteria. The scan quality is good. L-3 and L-4 were excluded due to degenerative changes. Site Region Measured Date Measured Age [age]A BMD Significant CHANGE T-score DualFemur Neck Left  10/25/[age]    66.8         -0.4    0.987 g/cm2 AP Spine  L1-L2      10/25[age]2019    66.10         0.8     1.267 g/cm2 DualFemur Total Mean 10/25/2019    66.8         0.2     1.032 g/cm2 World Health Organization [facility]) criteria for post-menopausal, Caucasian Women: Normal       T-score at or above -1 SD Osteopenia   T-score between -1 and -2.5 SD Osteoporosis T-score at or below -2.5 SD RECOMMENDATION: 1. All patients should optimize calcium and vitamin D intake. 2. Consider FDA approved medical therapies in postmenopausal women and men aged 6 years and older, based on the following: a. A hip or vertebral (clinical or morphometric) fracture b. T- score < or = -2.5 at the femoral neck or spine after appropriate evaluation to exclude secondary causes c. Low bone mass (T-score between -1.0 and -2.5 at the femoral neck or spine) and a 10 year probability of a hip fracture > or = 3% or a 10 year probability of a major osteoporosis-related fracture > or = 20% based on the [place]-adapted WHO algorithm d. Clinician judgment and/or patient preferences may indicate treatment for people with 10-year fracture probabilities above or below these levels FOLLOW-UP: Patients with diagnosis of osteoporosis or at high risk for fracture should have regular bone mineral density tests. For patients eligible for Medicare, routine testing is allowed once every 2 years. The testing frequency can be increased to one year for patients who have rapidly progressing disease, those who are receiving or discontinuing medical therapy to restore bone mass, or have additional risk factors. I have reviewed this report and agree with the above findings. [facility] Radiology Electronically Signed    By: Lowella Grip III M.D.   On: 08/25/2019 16:05   DG CHEST PORT 1 VIEW  Result Date: 09/03/2019 CLINICAL DATA:  Dialysis catheter insertion. EXAM: PORTABLE CHEST 1 VIEW COMPARISON:  September 03, 2019. FINDINGS: The heart size and mediastinal contours are within normal limits. No pneumothorax or pleural effusion is noted. Left lung is clear. Mildly improved right lung opacities are noted. Left internal jugular Port-A-Cath is unchanged in position. Interval placement of right internal jugular catheter with distal tip in expected position of the SVC. The visualized skeletal structures are unremarkable. IMPRESSION: Interval placement of right internal jugular catheter with distal tip in expected position of the SVC. No pneumothorax is noted. Mildly improved right lung opacities are noted. Electronically Signed   By: Marijo Conception M.D.   On: 09/03/2019 15:43  DG CHEST PORT 1 VIEW  Result Date: 09/03/2019 CLINICAL DATA:  Shortness of breath. EXAM: PORTABLE CHEST 1 VIEW COMPARISON:  August 19, 2018. FINDINGS: The heart size and mediastinal contours are within normal limits. No pneumothorax or pleural effusion is noted. Left lung is clear. Left internal jugular Port-A-Cath is unchanged in position. Interval development of right upper and lower lobe airspace opacities are noted concerning for possible pneumonia. The visualized skeletal structures are unremarkable. IMPRESSION: Interval development of right upper and lower lobe airspace opacities concerning for possible pneumonia. Follow-up radiographs are recommended to ensure resolution. Electronically Signed   By: Marijo Conception M.D.   On: 09/03/2019 08:06   MM SCREENING BREAST TOMO UNI L  Result Date: 08/26/2019 CLINICAL DATA:  Screening. EXAM: DIGITAL SCREENING UNILATERAL LEFT MAMMOGRAM WITH CAD AND TOMO COMPARISON:  Previous exam(s). ACR Breast Density Category c: The breast tissue is heterogeneously dense, which may obscure small masses. FINDINGS: There  are no findings suspicious for malignancy. Images were processed with CAD. IMPRESSION: No mammographic evidence of malignancy. A result letter of this screening mammogram will be mailed directly to the patient. RECOMMENDATION: Screening mammogram in one year. (Code:SM-B-01Y) BI-RADS CATEGORY  1: Negative. Electronically Signed   By: Franki Cabot M.D.   On: 08/26/2019 12:36   CT Renal Stone Study  Result Date: 09/02/2019 CLINICAL DATA:  Flank pain. EXAM: CT ABDOMEN AND PELVIS WITHOUT CONTRAST TECHNIQUE: Multidetector CT imaging of the abdomen and pelvis was performed following the standard protocol without IV contrast. COMPARISON:  March 15, 2018 FINDINGS: Lower chest: A focal 9 mm solid pleural based nodularity in the right middle lobe on axial series 2, image 7 new since September 2019 with adjacent streaky consolidation in the right middle lobe, possibly aspiration related. Follow-up imaging is indicated to exclude an underlying metastatic nodule given postoperative changes from right mastectomy. Normal heart size. Mild mitral valvular calcification. Hepatobiliary: Cholecystectomy. Mild hepatomegaly. Benign hepatic cysts, similarly sized; no further imaging characterization recommended. Chronic 10 mm dilatation of the proximal common bile duct with normal distal tapering and no intrahepatic biliary duct dilatation. Pancreas: Normal. Spleen: Normal. Adrenals/Urinary Tract: Stable 1.8 cm benign adrenal adenoma measuring 10 Hounsfield units. Normal right adrenal gland. Bilateral moderate perinephric inflammatory changes, slightly increased. Nonobstructing right renal punctate calculus. Mild left hydronephrosis without an obstructing renal or ureteral calculus. Decompressed bilateral ureters. Diffusely thickened urinary bladder with perivesicular stranding. Stomach/Bowel: Mild nonspecific bowel wall thickening of the duodenum and left upper abdominal quadrant jejunum without perforation or free fluid. Mild  adjacent inflammatory change. Reactive appearing lymph nodes in the associated mesenteric root. Nonobstructed. No colonic or gastric mucosal thickening. Small hiatal hernia. Normal appendix, axial series 2, image 67. Mild diverticulosis coli in the sigmoid and proximal transverse colon. Vascular/Lymphatic: Aortic calcified atherosclerosis. No abdominal aortic aneurysm. No adenopathy. Reproductive: Atrophic or surgically absent uterus. No adnexal cyst or mass. Other: Midline infraumbilical hernia containing non-strangulated, non incarcerated and nonobstructed small bowel loops. Small uncomplicated periumbilical herniation of fat. Musculoskeletal: Moderate degenerative change. Probable benign bone islands in the left hip. A sternal foramen. I discussed critical results and additional imaging recommendations by telephone at the time of interpretation on 09/02/2019 at 9:30 p.m. with provider Davonna Belling , who verbally acknowledged these results. IMPRESSION: Mild left hydronephrosis and diffuse circumferential wall thickening of the urinary bladder with mild perivesicular stranding. Findings support a clinical diagnosis of an acute cystitis with upper urinary tract infection. Bilateral moderate perinephric stranding could represent infection or medical renal disease.  No obstructing renal or ureteral calculus. Punctate nonobstructing right renal calculus. A 9 mm solid pleural based right middle lobe pulmonary nodularity with adjacent streaky consolidation, which could be aspiration-related. Outpatient unenhanced CT chest recommended with surveillance CT Chest in 3 months should this nodularity persist. A metastatic or malignant pulmonary nodule are differential considerations, particularly given the history of right mastectomy. Mild enteritis, consistent with the patient history of nausea and vomiting. Hepatomegaly and stable benign hepatic cysts for which no further imaging characterization or surveillance is  recommended. Uncomplicated infraumbilical abdominal wall herniation of small bowel loops without strangulation, incarceration or obstruction. Stable size of a small left adrenal benign adenoma. Electronically Signed   By: Revonda Humphrey   On: 09/02/2019 21:31     TODAY-DAY OF DISCHARGE:  Subjective:   Charlene Perry today has no headache,no chest abdominal pain,no new weakness tingling or numbness, feels much better wants to go home today.   Objective:   Blood pressure (!) 156/79, pulse 93, temperature 98 F (36.7 C), temperature source Oral, resp. rate 15, height 5\' 6"  (1.676 m), weight 107.9 kg, SpO2 97 %.  Intake/Output Summary (Last 24 hours) at 09/09/2019 1045 Last data filed at 09/09/2019 0900 Gross per 24 hour  Intake 1498.33 ml  Output 2300 ml  Net -801.67 ml   Filed Weights   09/08/19 0338 09/08/19 0650 09/09/19 0453  Weight: 106.4 kg 108.4 kg 107.9 kg    Exam: Awake Alert, Oriented *3, No new F.N deficits, Normal affect [place].AT,PERRAL Supple Neck,No JVD, No cervical lymphadenopathy appriciated.  Symmetrical Chest wall movement, Good air movement bilaterally, CTAB RRR,No Gallops,Rubs or new Murmurs, No Parasternal Heave +ve B.Sounds, Abd Soft, Non tender, No organomegaly appriciated, No rebound -guarding or rigidity. No Cyanosis, Clubbing or edema, No new Rash or bruise   PERTINENT RADIOLOGIC STUDIES: No results found.   PERTINENT LAB RESULTS: CBC: Recent Labs    09/07/19 0502 09/08/19 0346  WBC 5.1 4.9  HGB 8.0* 8.0*  HCT 24.6* 25.5*  PLT 191 237   CMET CMP     Component Value Date/Time   NA 139 09/09/2019 0334   K 4.0 09/09/2019 0334   CL 101 09/09/2019 0334   CO2 27 09/09/2019 0334   GLUCOSE 93 09/09/2019 0334   BUN 14 09/09/2019 0334   CREATININE 5.07 (H) 09/09/2019 0334   CREATININE 1.14 (H) 12/28/2018 0822   CALCIUM 8.2 (L) 09/09/2019 0334   CALCIUM 7.8 (L) 09/07/2019 0502   PROT 6.0 (L) 09/03/2019 0430   ALBUMIN 2.5 (L) 09/09/2019  0334   AST 25 09/03/2019 0430   AST 17 12/28/2018 0822   ALT 18 09/03/2019 0430   ALT 25 12/28/2018 0822   ALKPHOS 66 09/03/2019 0430   BILITOT 0.6 09/03/2019 0430   BILITOT 0.3 12/28/2018 0822   GFRNONAA 8 (L) 09/09/2019 0334   GFRNONAA 50 (L) 12/28/2018 0822   GFRAA 10 (L) 09/09/2019 0334   GFRAA 58 (L) 12/28/2018 0822    GFR Estimated Creatinine Clearance: 13.6 mL/min (A) (by C-G formula based on SCr of 5.07 mg/dL (H)). No results for input(s): LIPASE, AMYLASE in the last 72 hours. No results for input(s): CKTOTAL, CKMB, CKMBINDEX, TROPONINI in the last 72 hours. Invalid input(s): POCBNP No results for input(s): DDIMER in the last 72 hours. No results for input(s): HGBA1C in the last 72 hours. No results for input(s): CHOL, HDL, LDLCALC, TRIG, CHOLHDL, LDLDIRECT in the last 72 hours. No results for input(s): TSH, T4TOTAL, T3FREE, THYROIDAB in the last 72  hours.  Invalid input(s): FREET3 Recent Labs    09/07/19 0502  FERRITIN 64  TIBC 202*  IRON 44   Coags: No results for input(s): INR in the last 72 hours.  Invalid input(s): PT Microbiology: Recent Results (from the past 240 hour(s))  Urine culture     Status: Abnormal   Collection Time: 09/02/19  8:04 PM   Specimen: Urine, Random  Result Value Ref Range Status   Specimen Description URINE, RANDOM  Final   Special Requests   Final    NONE Performed at [facility] [address] [place], [place] [address]    Culture (A)  Final    >=100,000 COLONIES/mL DIPHTHEROIDS(CORYNEBACTERIUM SPECIES) Standardized susceptibility testing for this organism is not available.    Report Status 09/04/2019 FINAL  Final  Culture, blood (routine x 2)     Status: None   Collection Time: 09/02/19  8:09 PM   Specimen: BLOOD  Result Value Ref Range Status   Specimen Description   Final    BLOOD LEFT ANTECUBITAL Performed at [facility], [place] [address] [place], [place] [address]    Special Requests    Final    BOTTLES DRAWN AEROBIC AND ANAEROBIC Blood Culture adequate volume Performed at [facility] [address] [place], [place] [address]    Culture   Final    NO GROWTH 5 DAYS Performed at [facility], [place] [address] [place], [place] [address]    Report Status 09/07/2019 FINAL  Final  SARS CORONAVIRUS 2 (TAT 6-24 HRS) Nasopharyngeal Nasopharyngeal Swab     Status: Abnormal   Collection Time: 09/02/19 11:10 PM   Specimen: Nasopharyngeal Swab  Result Value Ref Range Status   SARS Coronavirus 2 POSITIVE (A) NEGATIVE Final    Comment: RESULT CALLED TO, READ BACK BY AND VERIFIED WITH: RN J TAOKINGTON @0640  09/03/19 BY S GEZAHEGN (NOTE) SARS-CoV-2 target nucleic acids are DETECTED. The SARS-CoV-2 RNA is generally detectable in upper and lower respiratory specimens during the acute phase of infection. Positive results are indicative of the presence of SARS-CoV-2 RNA. Clinical correlation with patient history and other diagnostic information is  necessary to determine patient infection status. Positive results do not rule out bacterial infection or co-infection with other viruses.  The expected result is Negative. Fact Sheet for Patients: SugarRoll.be Fact Sheet for Healthcare Providers: https://www.woods-mathews.com/ This test is not yet approved or cleared by the [place] FDA and  has been authorized for detection and/or diagnosis of SARS-CoV-2 by FDA under an Emergency Use Authorization (EUA). This EUA will remain  in effect (meaning this test can be used)  for the duration of the COVID-19 declaration under Section 564(b)(1) of the Act, 21 U.S.C. section 360bbb-3(b)(1), unless the authorization is terminated or revoked sooner. Performed at [facility], [place] [address], [place], [place] [address]   MRSA PCR Screening     Status: None   Collection Time: 09/03/19  1:37 PM   Specimen: Nasal Mucosa;  Nasopharyngeal  Result Value Ref Range Status   MRSA by PCR NEGATIVE NEGATIVE Final    Comment:        The GeneXpert MRSA Assay (FDA approved for NASAL specimens only), is one component of a comprehensive MRSA colonization surveillance program. It is not intended to diagnose MRSA infection nor to guide or monitor treatment for MRSA infections. Performed at [facility], [place] [address], [place], [place] [address]     FURTHER  DISCHARGE INSTRUCTIONS:  Get Medicines reviewed and adjusted: Please take all your medications with you for your next visit with your Primary MD  Laboratory/radiological data: Please request your Primary MD to go over all hospital tests and procedure/radiological results at the follow up, please ask your Primary MD to get all Hospital records sent to his/her office.  In some cases, they will be blood work, cultures and biopsy results pending at the time of your discharge. Please request that your primary care M.D. goes through all the records of your hospital data and follows up on these results.  Also Note the following: If you experience worsening of your admission symptoms, develop shortness of breath, life threatening emergency, suicidal or homicidal thoughts you must seek medical attention immediately by calling 911 or calling your MD immediately  if symptoms less severe.  You must read complete instructions/literature along with all the possible adverse reactions/side effects for all the Medicines you take and that have been prescribed to you. Take any new Medicines after you have completely understood and accpet all the possible adverse reactions/side effects.   Do not drive when taking Pain medications or sleeping medications (Benzodaizepines)  Do not take more than prescribed Pain, Sleep and Anxiety Medications. It is not advisable to combine anxiety,sleep and pain medications without talking with your primary care  practitioner  Special Instructions: If you have smoked or chewed Tobacco  in the last 2 yrs please stop smoking, stop any regular Alcohol  and or any Recreational drug use.  Wear Seat belts while driving.  Please note: You were cared for by a hospitalist during your hospital stay. Once you are discharged, your primary care physician will handle any further medical issues. Please note that NO REFILLS for any discharge medications will be authorized once you are discharged, as it is imperative that you return to your primary care physician (or establish a relationship with a primary care physician if you do not have one) for your post hospital discharge needs so that they can reassess your need for medications and monitor your lab values.  Total Time spent coordinating discharge including counseling, education and face to face time equals 35 minutes.  SignedOren Binet 09/09/2019 10:45 AM

## 2019-09-09 NOTE — Progress Notes (Signed)
Patient ID: Charlene Perry, female   DOB: 30-May-1951, 69 y.o.   MRN: [id number] [facility] Progress Note   Assessment/ Plan:   1. Anuric Acute kidney Injury: Clinically suspected to be from ATN in the setting of significant GI volume depletion with ongoing ARB/diuretics plus/minus UTI.  Potentially can discharge today to outpatient AKI HD; if here tomorrow will provide therapy.  UOP increased but still very low, not yet with sufficient evidence of GFR recovery. 4h, 2L UF, 2K, TDC, 400/800, tight heparin 2.  Urinary tract infection with sepsis: improved; off ABX; per TRH 3.  Anemia: Likely secondary to critical illness,stable, will need to start ESA with HD and Fe once infection resolved: Ferritin 65 and TSAT 22% on 3/24.   4.  Hypertension: Blood pressures fairly controlled-continue to hold ARB and monitor with UF at HD.  Subjective:    No complaints, 250 mL UOP yesterday\  Accepted to [facility] for AKI HD  HD yesterday, 2L UF  AM labs K 4.0, BUN 14    Objective:   BP (!) 156/79 (BP Location: Left Arm)   Pulse 93   Temp 98 F (36.7 C) (Oral)   Resp 15   Ht 5\' 6"  (1.676 m)   Wt 107.9 kg   SpO2 97%   BMI 38.39 kg/m   Intake/Output Summary (Last 24 hours) at 09/09/2019 1029 Last data filed at 09/09/2019 0900 Gross per 24 hour  Intake 1498.33 ml  Output 2300 ml  Net -801.67 ml   Weight change: 1.5 kg  Physical Exam: Gen: Obese woman NAD resting comfortably in bed CVS: Pulse regular rhythm, normal rate, S1 and S2 normal.  Right IJ TDC Resp: CTAB Abd: Soft, obese, nontender Ext: Trace lower extremity edema.    Imaging: No results found.  Labs: BMET Recent Labs  Lab 09/04/19 0508 09/04/19 0508 09/04/19 1600 09/05/19 0546 09/06/19 0858 09/07/19 0502 09/08/19 0346 09/09/19 0334  NA 136  --  136 136 139 138 139 139  K 4.5  --  4.5 4.1 4.0 3.6 4.1 4.0  CL 100  --  98 102 105 104 104 101  CO2 25  --  25 25 23 27 24 27   GLUCOSE 126*  --  176* 102*  111* 119* 111* 93  BUN 60*  --  58* 38* 33* 15 25* 14  CREATININE 5.34*  --  6.29* 4.67* 6.35* 5.12* 7.17* 5.07*  CALCIUM 7.7*   < > 8.1* 7.8* 8.0* 7.8*  7.8* 8.2* 8.2*  PHOS 4.8*  --  5.2* 3.6 3.8 3.4 5.8* 4.9*   < > = values in this interval not displayed.   CBC Recent Labs  Lab 09/02/19 1758 09/03/19 0430 09/07/19 0502 09/08/19 0346  WBC 8.2 9.0 5.1 4.9  NEUTROABS  --  7.0  --   --   HGB 11.4* 10.2* 8.0* 8.0*  HCT 35.9* 32.4* 24.6* 25.5*  MCV 91.3 91.3 87.2 90.4  PLT 253 241 191 237   Medications:    . carvedilol  12.5 mg Oral BID WC  . Chlorhexidine Gluconate Cloth  6 each Topical Daily  . heparin  5,000 Units Subcutaneous Q8H  . insulin aspart  0-9 Units Subcutaneous TID WC  . letrozole  2.5 mg Oral Daily  . levothyroxine  125 mcg Oral QAC breakfast  . sodium chloride flush  3 mL Intravenous Q12H   Rexene Agent , MD 09/09/2019, 10:29 AM

## 2019-09-09 NOTE — Progress Notes (Signed)
Patient has been accepted at [facility] on a TTS schedule with a seat time of 12:00pm. She needs to arrive to her appointments at 11:40am. She is able to start tomorrow in the OP HD clinic if she goes straight there at discharge today. Navigator received message from Dr. Joelyn Oms that patient is ready for discharge and wants to leave today. Renal Navigator met with patient who confirms that she is eager to get home and is able to go straight to the clinic, as her husband is on his way to pick her up now. RN came in to room as we were discussing plan and states discharge is in process. Renal Navigator messaged Attending/Dr. Sloan Leiter, who states he is working on discharge now also.  Patient states appreciation in ability to discharge today and start in OP HD clinic tomorrow. Renal Navigator notified Renal PA to request HD orders be sent to clinic. Patient is cleared for discharge from a Renal standpoint.  Alphonzo Cruise,  Renal Navigator [phone]

## 2019-09-11 ENCOUNTER — Telehealth: Payer: Self-pay | Admitting: Nephrology

## 2019-09-11 NOTE — Telephone Encounter (Signed)
Transition of care contact from inpatient facility  Date of Discharge: 09/09/19 Date of Contact: 09/11/19 Method of contact: phone Talked with: patient  Patient contact to discuss transition of care from recent inpatient hospitalization. Pateint was admitted to [facility] from : 3/19 - 09/09/19 ith the diagnosis of AKI suspect ATN CRRT > HD at discharge; sepsis 2/2 UTI/pyelo s/p 7 days abtx -   Medication changes were reviewed.  She has had am nausea without vomiting since discharge. Making a little more urine.  Some anorexia.  Discussed diet modifications.  Ordered zofran to [facility].     Patient will follow up at outpatient dialysis on  3/30- no problems at dialysis Saturday - worn out afterwards. No BP drop  Other follow up needs: f/u CT of incidental lung nodule in 3 month.    Amalia Hailey, PA-C [facility] Pager:  [phone]

## 2019-09-15 DIAGNOSIS — E440 Moderate protein-calorie malnutrition: Secondary | ICD-10-CM | POA: Insufficient documentation

## 2019-10-03 DIAGNOSIS — Z992 Dependence on renal dialysis: Secondary | ICD-10-CM | POA: Insufficient documentation

## 2019-10-14 ENCOUNTER — Other Ambulatory Visit: Payer: Self-pay

## 2019-10-14 ENCOUNTER — Ambulatory Visit (INDEPENDENT_AMBULATORY_CARE_PROVIDER_SITE_OTHER): Payer: BC Managed Care – PPO | Admitting: Pulmonary Disease

## 2019-10-14 ENCOUNTER — Encounter: Payer: Self-pay | Admitting: Pulmonary Disease

## 2019-10-14 VITALS — BP 118/70 | HR 86 | Temp 97.7°F | Ht 66.0 in | Wt 196.4 lb

## 2019-10-14 DIAGNOSIS — R0683 Snoring: Secondary | ICD-10-CM

## 2019-10-14 DIAGNOSIS — G4733 Obstructive sleep apnea (adult) (pediatric): Secondary | ICD-10-CM | POA: Diagnosis not present

## 2019-10-14 NOTE — Patient Instructions (Signed)
Home sleep study Meanwhile , trial of provent device for snoring

## 2019-10-14 NOTE — Assessment & Plan Note (Signed)
We discussed that snoring is very difficult to abolish.  She has had UPPP already  Given excessive daytime somnolence, narrow pharyngeal exam, witnessed apneas & loud snoring, obstructive sleep apnea is still possible & an overnight polysomnogram will be scheduled as a repeat home study. The pathophysiology of obstructive sleep apnea , it's cardiovascular consequences & modes of treatment including CPAP were discused with the patient in detail & they evidenced understanding.  Meanwhile she will try the Pro-Vent device which provide CPAP and see if this will help with her snoring.  If home sleep test shows OSA, then she is certainly agreeable to getting back on CPAP

## 2019-10-14 NOTE — Progress Notes (Signed)
Subjective:    Patient ID: Charlene Perry, female    DOB: 10/22/1950, 69 y.o.   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 She had a UTI and unfortunately ended up with acute renal failure requiring dialysis, she is starting to make urine now and is hoping for renal recovery. She was diagnosed with OSA in 2002 last sleep test done at [facility] long, underwent UPPP but this was not helpful, was maintained on CPAP 11 cm with nasal pillows for many years, stopped about 3 years ago she underwent repeat home sleep testing 11/2018 and did not qualify for CPAP machine. I have reviewed the study in detail  Epworth sleepiness score is 9 and she reports sleepiness while watching TV, and afternoons.  Husband reports loud snoring, history independently obtained from husband Chrissie Noa.  He often has to leave the bedroom and this also disturbs her sleep. Bedtime is around 11 PM sleep latency is minimal, she sleeps on her left side preferably with 1 pillow, reports 1 nocturnal awakening for nocturia and is out of bed at 8 AM feeling rested without dryness of mouth or headaches. She has lost about 40 pounds from 237 to her current weight of 196 pounds with dialysis  There is no history suggestive of cataplexy, sleep paralysis or parasomnias     Significant tests/ events reviewed  HST 11/2018 AHI 0.6/h   Past Medical History:  Diagnosis Date  . Arthritis   . Breast cancer in female [facility])    Right  . Cancer ([place])   . Diabetes mellitus without complication ([place])   . Family history of breast cancer   . Family history of colon cancer   . Hypertension    takes fluid pills  . Hypothyroidism   . Pink eye disease of left eye    went to eye md and received drops - yesterday.  . Sleep apnea    back in the 1990's  . Vitiligo    Past Surgical History:  Procedure Laterality Date  . AXILLARY LYMPH NODE DISSECTION Right 07/13/2018   Procedure:  RIGHT AXILLARY LYMPH NODE DISSECTION;  Surgeon: [name], MD;  Location: [place];  Service: General;  Laterality: Right;  . BREAST BIOPSY Bilateral 01/2019  . BREAST SURGERY     left breast fibro adenoma  . CATARACT EXTRACTION Right 2019  . CERVICAL FUSION N/A    C5 &6  . CESAREAN SECTION    . CHOLECYSTECTOMY  2004-2005  . CYSTECTOMY     patient denies  . EYE SURGERY    . IR FLUORO GUIDE CV LINE RIGHT  09/07/2019  . IR IMAGING GUIDED PORT INSERTION  07/29/2018  . IR US GUIDE VASC ACCESS RIGHT  09/07/2019  . MASTECTOMY Right 06/21/2018  . MASTECTOMY WITH RADIOACTIVE SEED GUIDED EXCISION AND AXILLARY SENTINEL LYMPH NODE BIOPSY Right 06/21/2018  . MASTECTOMY WITH RADIOACTIVE SEED GUIDED EXCISION AND AXILLARY SENTINEL LYMPH NODE BIOPSY Right 06/21/2018   Procedure: RIGHT TOTAL MASTECTOMY WITH RIGHT AXILLARY SENTINEL NODE BIOPSY AND RIGHT SEED GUIDED NODE EXCISION;  Surgeon: [name], MD;  Location: [place];  Service: General;  Laterality: Right;  . MYOMECTOMY  1986  . PARTIAL HYSTERECTOMY  1995  . PILONIDAL CYST EXCISION    . PORTACATH PLACEMENT N/A 07/13/2018   Procedure: INSERTION PORT-A-CATH WITH ULTRASOUND;  Surgeon: [name], MD;  Location: [place];  Service: General;  Laterality: N/A;  . TONSILLECTOMY  2002  . UVULOPALATOPHARYNGOPLASTY, TONSILLECTOMY AND SEPTOPLASTY  No Known Allergies  Social History   Socioeconomic History  . Marital status: Married    Spouse name: Not on file  . Number of children: Not on file  . Years of education: Not on file  . Highest education level: Not on file  Occupational History  . Not on file  Tobacco Use  . Smoking status: Former Smoker    Types: Cigarettes  . Smokeless tobacco: Never Used  . Tobacco comment: Quit 35 years ago  Substance and Sexual Activity  . Alcohol use: Not Currently  . Drug use: Never  . Sexual activity: Not on file  Other Topics Concern  . Not on file  Social History Narrative  . Not on  file   Social Determinants of Health   Financial Resource Strain:   . Difficulty of Paying Living Expenses:   Food Insecurity:   . Worried About Charity fundraiser in the Last Year:   . Arboriculturist in the Last Year:   Transportation Needs:   . Film/video editor (Medical):   Marland Kitchen Lack of Transportation (Non-Medical):   Physical Activity:   . Days of Exercise per Week:   . Minutes of Exercise per Session:   Stress:   . Feeling of Stress :   Social Connections:   . Frequency of Communication with Friends and Family:   . Frequency of Social Gatherings with Friends and Family:   . Attends Religious Services:   . Active Member of Clubs or Organizations:   . Attends Archivist Meetings:   Marland Kitchen Marital Status:   Intimate Partner Violence:   . Fear of Current or Ex-Partner:   . Emotionally Abused:   Marland Kitchen Physically Abused:   . Sexually Abused:      Family History  Problem Relation Age of Onset  . Colon cancer Mother 91  . Colon cancer Sister 38  . Colon cancer Brother        ex early 12's  . Breast cancer Other        40's      Review of Systems Constitutional: negative for anorexia, fevers and sweats  Eyes: negative for irritation, redness and visual disturbance  Ears, nose, mouth, throat, and face: negative for earaches, epistaxis, nasal congestion and sore throat  Respiratory: negative for cough, dyspnea on exertion, sputum and wheezing  Cardiovascular: negative for chest pain, dyspnea, lower extremity edema, orthopnea, palpitations and syncope  Gastrointestinal: negative for abdominal pain, constipation, diarrhea, melena, nausea and vomiting  Genitourinary:negative for dysuria, frequency and hematuria  Hematologic/lymphatic: negative for bleeding, easy bruising and lymphadenopathy  Musculoskeletal:negative for arthralgias, muscle weakness and stiff joints  Neurological: negative for coordination problems, gait problems, headaches and weakness  Endocrine:  negative for diabetic symptoms including polydipsia, polyuria and weight loss     Objective:   Physical Exam  Gen. Pleasant, obese, in no distress, normal affect ENT - no pallor,icterus, no post nasal drip, class 2 airway, s/p UPPP Neck: No JVD, no thyromegaly, no carotid bruits, RIJ permacath Lungs: no use of accessory muscles, no dullness to percussion, decreased without rales or rhonchi  Cardiovascular: Rhythm regular, heart sounds  normal, no murmurs or gallops, no peripheral edema Abdomen: soft and non-tender, no hepatosplenomegaly, BS normal. Musculoskeletal: No deformities, no cyanosis or clubbing Neuro:  alert, non focal, no tremors       Assessment & Plan:

## 2019-12-02 ENCOUNTER — Other Ambulatory Visit: Payer: Self-pay

## 2019-12-02 ENCOUNTER — Ambulatory Visit (INDEPENDENT_AMBULATORY_CARE_PROVIDER_SITE_OTHER): Payer: BC Managed Care – PPO

## 2019-12-02 ENCOUNTER — Ambulatory Visit
Admission: RE | Admit: 2019-12-02 | Discharge: 2019-12-02 | Disposition: A | Discharge status: Home / Self Care | Payer: BC Managed Care – PPO | Source: Ambulatory Visit

## 2019-12-02 VITALS — BP 103/68 | HR 96 | Temp 99.2°F | Resp 20

## 2019-12-02 DIAGNOSIS — Z853 Personal history of malignant neoplasm of breast: Secondary | ICD-10-CM | POA: Diagnosis not present

## 2019-12-02 DIAGNOSIS — R05 Cough: Secondary | ICD-10-CM | POA: Diagnosis not present

## 2019-12-02 DIAGNOSIS — Z20828 Contact with and (suspected) exposure to other viral communicable diseases: Secondary | ICD-10-CM | POA: Diagnosis not present

## 2019-12-02 DIAGNOSIS — R059 Cough, unspecified: Secondary | ICD-10-CM

## 2019-12-02 DIAGNOSIS — Z992 Dependence on renal dialysis: Secondary | ICD-10-CM | POA: Diagnosis not present

## 2019-12-02 HISTORY — DX: Disorder of kidney and ureter, unspecified: N28.9

## 2019-12-02 MED ORDER — DOXYCYCLINE HYCLATE 100 MG PO CAPS: 100.0000 mg | ORAL_CAPSULE | Freq: Two times a day (BID) | ORAL | 0 refills | Status: DC

## 2019-12-02 NOTE — ED Provider Notes (Signed)
[facility]    CSN: [id number] Arrival date & time: 12/02/19  0956      History   Chief Complaint Chief Complaint  Patient presents with  . Cough    HPI Charlene Perry is a 69 y.o. female with history of ESRD on HD, hypertension, diabetes, breast cancer, sleep apnea, obesity presenting for productive cough since Tuesday.  Patient also notes scratchy throat.  States sputum is white/cloudy, without blood.  States she also had abdominal pain Wednesday after 2 consecutive days of HD: Resolved with Tylenol.  No vomiting, diarrhea.  Denies nasal congestion, runny nose, ear pain, fever, chills, chest pain, palpitations, difficulty breathing, lower extremity edema.  Of note, patient tested positive for Covid about 3 months ago, though never had symptoms.  No known sick contacts.  Attends HD Tuesday, Thursday, Saturday.  Not currently under treatment for breast cancer: Finished radiation September 2020.  Past Medical History:  Diagnosis Date  . Arthritis   . Breast cancer in female [facility])    Right  . Cancer ([place])   . Diabetes mellitus without complication ([place])   . Family history of breast cancer   . Family history of colon cancer   . Hypertension    takes fluid pills  . Hypothyroidism   . Pink eye disease of left eye    went to eye md and received drops - yesterday.  . Renal disorder   . Sleep apnea    back in the 1990's  . Vitiligo     Patient Active Problem List   Diagnosis Date Noted  . Snoring 10/14/2019  . Diabetes mellitus type II, non insulin dependent ([place]) 09/03/2019  . Acute pyelonephritis   . Acute kidney injury ([place]) 09/02/2019  . Port-A-Cath in place 08/24/2018  . Breast cancer, right ([place]) 06/21/2018  . Genetic testing 01/14/2018  . Family history of colon cancer   . Family history of breast cancer   . Malignant neoplasm of upper-outer quadrant of right breast in female, estrogen receptor positive ([place]) 12/15/2017    Past Surgical History:   Procedure Laterality Date  . AXILLARY LYMPH NODE DISSECTION Right 07/13/2018   Procedure: RIGHT AXILLARY LYMPH NODE DISSECTION;  Surgeon: [name], MD;  Location: [facility];  Service: General;  Laterality: Right;  . BREAST BIOPSY Bilateral 01/2019  . BREAST SURGERY     left breast fibro adenoma  . CATARACT EXTRACTION Right 2019  . CERVICAL FUSION N/A    C5 &6  . CESAREAN SECTION    . CHOLECYSTECTOMY  2004-2005  . CYSTECTOMY     patient denies  . EYE SURGERY    . IR FLUORO GUIDE CV LINE RIGHT  09/07/2019  . IR IMAGING GUIDED PORT INSERTION  07/29/2018  . IR US GUIDE VASC ACCESS RIGHT  09/07/2019  . MASTECTOMY Right 06/21/2018  . MASTECTOMY WITH RADIOACTIVE SEED GUIDED EXCISION AND AXILLARY SENTINEL LYMPH NODE BIOPSY Right 06/21/2018  . MASTECTOMY WITH RADIOACTIVE SEED GUIDED EXCISION AND AXILLARY SENTINEL LYMPH NODE BIOPSY Right 06/21/2018   Procedure: RIGHT TOTAL MASTECTOMY WITH RIGHT AXILLARY SENTINEL NODE BIOPSY AND RIGHT SEED GUIDED NODE EXCISION;  Surgeon: [name], MD;  Location: [facility];  Service: General;  Laterality: Right;  . MYOMECTOMY  1986  . PARTIAL HYSTERECTOMY  1995  . PILONIDAL CYST EXCISION    . PORTACATH PLACEMENT N/A 07/13/2018   Procedure: INSERTION PORT-A-CATH WITH ULTRASOUND;  Surgeon: [name], MD;  Location: [facility];  Service: General;  Laterality: N/A;  . TONSILLECTOMY  2002  . UVULOPALATOPHARYNGOPLASTY, TONSILLECTOMY AND SEPTOPLASTY      OB History   No obstetric history on file.      Home Medications    Prior to Admission medications   Medication Sig Start Date End Date Taking? Authorizing Provider  carvedilol (COREG) 12.5 MG tablet Take 1 tablet (12.5 mg total) by mouth 2 (two) times daily with a meal. 09/09/19  Yes Ghimire, Henreitta Leber, MD  dextromethorphan (DELSYM) 30 MG/5ML liquid Take by mouth as needed for cough.   Yes [provider]  furosemide (LASIX) 80 MG tablet Take 1 tablet by mouth in the morning and at bedtime.  Patient takes on days she does not have dialysis (Mon, Wed, Fri) 09/29/19  Yes [provider]  letrozole (FEMARA) 2.5 MG tablet Take 1 tablet (2.5 mg total) by mouth daily. 03/07/19  Yes [name], MD  levothyroxine (SYNTHROID, LEVOTHROID) 125 MCG tablet Take 125 mcg by mouth daily before breakfast.   Yes [provider]  rosuvastatin (CRESTOR) 40 MG tablet Take 40 mg by mouth at bedtime.    Yes [provider]  doxycycline (VIBRAMYCIN) 100 MG capsule Take 1 capsule (100 mg total) by mouth 2 (two) times daily. 12/02/19   Hall-Potvin, [place], PA-C    Family History Family History  Problem Relation Age of Onset  . Colon cancer Mother 41  . Colon cancer Sister 56  . Colon cancer Brother        ex early 80's  . Breast cancer Other        53's    Social History Social History   Tobacco Use  . Smoking status: Former Smoker    Types: Cigarettes  . Smokeless tobacco: Never Used  . Tobacco comment: Quit 35 years ago  Vaping Use  . Vaping Use: Never used  Substance Use Topics  . Alcohol use: Not Currently  . Drug use: Never     Allergies   Patient has no known allergies.   Review of Systems As per HPI   Physical Exam Triage Vital Signs ED Triage Vitals  Enc Vitals Group     BP      Pulse      Resp      Temp      Temp src      SpO2      Weight      Height      Head Circumference      Peak Flow      Pain Score      Pain Loc      Pain Edu?      Excl. in [place]?    No data found.  Updated Vital Signs BP 103/68 (BP Location: Right Arm)   Pulse 96   Temp 99.2 F (37.3 C) (Oral)   Resp 20   SpO2 96%   Visual Acuity Right Eye Distance:   Left Eye Distance:   Bilateral Distance:    Right Eye Near:   Left Eye Near:    Bilateral Near:     Physical Exam Constitutional:      General: She is not in acute distress.    Appearance: She is obese. She is not ill-appearing.  HENT:     Head: Normocephalic and atraumatic.     Right Ear:  Tympanic membrane, ear canal and external ear normal.     Left Ear: Tympanic membrane, ear canal and external ear normal.     Nose: Nose normal.  Mouth/Throat:     Mouth: Mucous membranes are moist.     Pharynx: Oropharynx is clear.  Eyes:     General: No scleral icterus.    Conjunctiva/sclera: Conjunctivae normal.     Pupils: Pupils are equal, round, and reactive to light.  Cardiovascular:     Rate and Rhythm: Normal rate and regular rhythm.  Pulmonary:     Effort: Pulmonary effort is normal. No respiratory distress.     Breath sounds: Rhonchi present. No wheezing or rales.  Abdominal:     Palpations: Abdomen is soft.     Tenderness: There is no abdominal tenderness.  Musculoskeletal:     Cervical back: Neck supple. No tenderness.     Right lower leg: No edema.     Left lower leg: No edema.  Lymphadenopathy:     Cervical: No cervical adenopathy.  Skin:    Coloration: Skin is not jaundiced or pale.  Neurological:     Mental Status: She is alert and oriented to person, place, and time.      UC Treatments / Results  Labs (all labs ordered are listed, but only abnormal results are displayed) Labs Reviewed  NOVEL CORONAVIRUS, NAA    EKG   Radiology DG Chest 2 View  Result Date: 12/02/2019 CLINICAL DATA:  69 year old female with cough for 3 days. Dialysis. History of right side breast cancer. EXAM: CHEST - 2 VIEW COMPARISON:  Portable chest 09/03/2019 and earlier. FINDINGS: New right chest tunneled type dialysis catheter. Stable left chest power port. Lung volumes and mediastinal contours are normal. However, asymmetric patchy peribronchial opacity persists since March. The left lung appears clear. No pneumothorax, pulmonary edema, pleural effusion. Prior ACDF. No acute osseous abnormality identified. Stable cholecystectomy clips. Negative visible bowel gas pattern. Chronic right axillary node dissection. IMPRESSION: 1. Right lung peribronchial opacity, some of which seems  present since March. This is nonspecific but suspicious for bronchopneumonia in the setting of cough. If unresolved following trial of antibiotic therapy then recommend follow-up Chest CT, which can be compared to 07/02/2018. 2. No other acute cardiopulmonary abnormality. Electronically Signed   By: Genevie Ann M.D.   On: 12/02/2019 10:54    Procedures Procedures (including critical care time)  Medications Ordered in UC Medications - No data to display  Initial Impression / Assessment and Plan / UC Course  I have reviewed the triage vital signs and the nursing notes.  Pertinent labs & imaging results that were available during my care of the patient were reviewed by me and considered in my medical decision making (see chart for details).     Patient febrile, nontoxic in office today.  Chest x-ray done office, reviewed by me radiology: Significant for right lung peribronchial opacity.  Has been present since March, though there is concern for bronchopneumonia in the setting of cough.  Recommended trial of antibiotic therapy and if no improvement CT scanning for follow-up.  Reviewed findings with patient verbalized understanding.  Patient already has PCP appointment in 2 weeks: We will keep and discuss imaging.  X-ray report provided.  Will start doxycycline in the interim.  Patient electing to undergo Covid testing as she has had negative Covid test since initial positive.  Return precautions discussed, patient verbalized understanding and is agreeable to plan. Final Clinical Impressions(s) / UC Diagnoses   Final diagnoses:  Cough     Discharge Instructions     Take antibiotic twice daily with food. Important to keep all HD appointments. Recommend follow-up with PCP  in 1 week for reevaluation as further imaging may be needed. Go to ER for worsening cough, fever, malaise, joint or muscle aches, chest pain, difficulty breathing.    ED Prescriptions    Medication Sig Dispense Auth. Provider    doxycycline (VIBRAMYCIN) 100 MG capsule Take 1 capsule (100 mg total) by mouth 2 (two) times daily. 20 capsule Hall-Potvin, [place], PA-C     PDMP not reviewed this encounter.   Hall-Potvin, [place], [place] 12/02/19 1102

## 2019-12-02 NOTE — ED Triage Notes (Signed)
Pt c/o productive cough with white/cloudy sputum since Tuesday, also c/o scratchy throat. Pt c/o abdominal pain on Wednesday as well after completing dialysis two days in a row (Tues/Wed). Abdom pain resolved with tylenol. Denies congestion, runny nose, HA, n/v/d, fever, chills, SOB, dysuria sx.   Pt states she tested positive for COVID approx 3 months ago but never had symptoms.

## 2019-12-02 NOTE — Discharge Instructions (Addendum)
Take antibiotic twice daily with food. Important to keep all HD appointments. Recommend follow-up with PCP in 1 week for reevaluation as further imaging may be needed. Go to ER for worsening cough, fever, malaise, joint or muscle aches, chest pain, difficulty breathing.

## 2019-12-03 LAB — NOVEL CORONAVIRUS, NAA: SARS-CoV-2, NAA: NOT DETECTED

## 2019-12-03 LAB — SARS-COV-2, NAA 2 DAY TAT

## 2019-12-05 DIAGNOSIS — E876 Hypokalemia: Secondary | ICD-10-CM | POA: Insufficient documentation

## 2019-12-05 DIAGNOSIS — N189 Chronic kidney disease, unspecified: Secondary | ICD-10-CM | POA: Insufficient documentation

## 2019-12-08 ENCOUNTER — Other Ambulatory Visit: Payer: Self-pay

## 2019-12-08 ENCOUNTER — Ambulatory Visit: Payer: BC Managed Care – PPO

## 2019-12-08 DIAGNOSIS — G4733 Obstructive sleep apnea (adult) (pediatric): Secondary | ICD-10-CM

## 2019-12-08 DIAGNOSIS — R0683 Snoring: Secondary | ICD-10-CM

## 2019-12-13 ENCOUNTER — Telehealth: Payer: Self-pay | Admitting: Pulmonary Disease

## 2019-12-13 DIAGNOSIS — G4733 Obstructive sleep apnea (adult) (pediatric): Secondary | ICD-10-CM

## 2019-12-13 NOTE — Telephone Encounter (Signed)
HST showed mild OSA with AHI 13/ hr Suggest autoCPAP  5-15 cm, mask of choice OV with me/APP in 6 wks

## 2019-12-16 NOTE — Telephone Encounter (Signed)
ATC pt, no answer. Left message for pt to call back.  

## 2019-12-29 NOTE — Telephone Encounter (Signed)
ATC pt, no answer. Left message for pt to call back.  

## 2019-12-29 NOTE — Telephone Encounter (Signed)
Pt returning a phone call. Pt can be reached at [phone].

## 2019-12-29 NOTE — Telephone Encounter (Signed)
Called pt and advised message from the provider. Pt understood and verbalized understanding. Nothing further is needed.   CPAP order sent.

## 2020-01-02 ENCOUNTER — Other Ambulatory Visit: Payer: Self-pay

## 2020-01-02 DIAGNOSIS — N179 Acute kidney failure, unspecified: Secondary | ICD-10-CM

## 2020-01-04 DIAGNOSIS — T7840XA Allergy, unspecified, initial encounter: Secondary | ICD-10-CM | POA: Insufficient documentation

## 2020-01-04 DIAGNOSIS — T782XXA Anaphylactic shock, unspecified, initial encounter: Secondary | ICD-10-CM | POA: Insufficient documentation

## 2020-01-11 ENCOUNTER — Ambulatory Visit (HOSPITAL_COMMUNITY)
Admission: RE | Admit: 2020-01-11 | Discharge: 2020-01-11 | Disposition: A | Discharge status: Home / Self Care | Payer: BC Managed Care – PPO | Source: Ambulatory Visit | Attending: Vascular Surgery | Admitting: Vascular Surgery

## 2020-01-11 ENCOUNTER — Ambulatory Visit (INDEPENDENT_AMBULATORY_CARE_PROVIDER_SITE_OTHER)
Admission: RE | Admit: 2020-01-11 | Discharge: 2020-01-11 | Disposition: A | Discharge status: Home / Self Care | Payer: BC Managed Care – PPO | Source: Ambulatory Visit | Attending: Vascular Surgery | Admitting: Vascular Surgery

## 2020-01-11 ENCOUNTER — Other Ambulatory Visit: Payer: Self-pay

## 2020-01-11 ENCOUNTER — Ambulatory Visit (INDEPENDENT_AMBULATORY_CARE_PROVIDER_SITE_OTHER): Payer: BC Managed Care – PPO | Admitting: Vascular Surgery

## 2020-01-11 ENCOUNTER — Encounter: Payer: Self-pay | Admitting: Vascular Surgery

## 2020-01-11 VITALS — BP 111/74 | HR 83 | Temp 98.2°F | Resp 20 | Ht 66.0 in | Wt 182.0 lb

## 2020-01-11 DIAGNOSIS — N179 Acute kidney failure, unspecified: Secondary | ICD-10-CM | POA: Insufficient documentation

## 2020-01-11 DIAGNOSIS — R809 Proteinuria, unspecified: Secondary | ICD-10-CM | POA: Insufficient documentation

## 2020-01-11 DIAGNOSIS — N186 End stage renal disease: Secondary | ICD-10-CM

## 2020-01-11 DIAGNOSIS — Z992 Dependence on renal dialysis: Secondary | ICD-10-CM | POA: Diagnosis not present

## 2020-01-11 NOTE — Progress Notes (Signed)
REASON FOR CONSULT:    To evaluate for hemodialysis.  The consult is requested by Dr. Marval Regal.  ASSESSMENT & PLAN:   END-STAGE RENAL DISEASE: Based on her vein map the patient appears to be a candidate for a left radiocephalic fistula or left brachiocephalic fistula.  She does not want access in the right arm given her previous breast surgery and lymph node dissection.  In addition she has a high bifurcation of the brachial artery in the right and therefore this would not be ideal for access. I have explained the indications for placement of an AV fistula or AV graft. I've explained that if at all possible we will place an AV fistula.  I have reviewed the risks of placement of an AV fistula including but not limited to: failure of the fistula to mature, need for subsequent interventions, and thrombosis. In addition I have reviewed the potential complications of placement of an AV graft. These risks include, but are not limited to, graft thrombosis, graft infection, wound healing problems, bleeding, arm swelling, and steal syndrome. All the patient's questions were answered and they are agreeable to proceed with surgery.  She plans on doing peritoneal dialysis.  I did discuss this over the phone today with Dr. Marval Regal, and we explained to her that the fistula would be a backup.  Regardless, she would like to discuss this further with her husband and also discussed it with the dialysis center.  Hopefully she will call in the near future to schedule a fistula in the left arm.  Charlene Mayo, MD Office: [phone]   HPI:   Charlene Perry is a pleasant 69 y.o. female, who presents for evaluation for hemodialysis access.  The patient has end-stage renal disease and is on dialysis using a tunneled dialysis catheter that was placed in March.  She had been admitted to the hospital with sepsis and developed renal insufficiency.  She dialyzes on Tuesdays Thursdays and Saturdays.  He denies  any recent uremic symptoms.  Specifically, she denies nausea, vomiting, fatigue, anorexia, or palpitations.  She would like to do peritoneal dialysis but a catheter has not been placed yet.   She is right-handed.  She does not have a pacemaker.  She is not on any blood thinners.  I have reviewed the records from the referring office.  The patient would like a fistula placed for a backup given that she plans peritoneal dialysis.  Past Medical History:  Diagnosis Date  . Arthritis   . Breast cancer in female [facility])    Right  . Cancer ([place])   . Diabetes mellitus without complication ([place])   . Family history of breast cancer   . Family history of colon cancer   . Hypertension    takes fluid pills  . Hypothyroidism   . Pink eye disease of left eye    went to eye md and received drops - yesterday.  . Renal disorder   . Sleep apnea    back in the 1990's  . Vitiligo     Family History  Problem Relation Age of Onset  . Colon cancer Mother 65  . Colon cancer Sister 1  . Colon cancer Brother        ex early 22's  . Breast cancer Other        40's    SOCIAL HISTORY: Social History   Socioeconomic History  . Marital status: Married    Spouse name: Not on file  . Number of children:  1  . Years of education: Not on file  . Highest education level: Not on file  Occupational History  . Not on file  Tobacco Use  . Smoking status: Former Smoker    Types: Cigarettes  . Smokeless tobacco: Never Used  . Tobacco comment: Quit 35 years ago  Vaping Use  . Vaping Use: Never used  Substance and Sexual Activity  . Alcohol use: Not Currently  . Drug use: Never  . Sexual activity: Not on file  Other Topics Concern  . Not on file  Social History Narrative  . Not on file   Social Determinants of Health   Financial Resource Strain:   . Difficulty of Paying Living Expenses:   Food Insecurity:   . Worried About Charity fundraiser in the Last Year:   . Arboriculturist in the Last  Year:   Transportation Needs:   . Film/video editor (Medical):   Marland Kitchen Lack of Transportation (Non-Medical):   Physical Activity:   . Days of Exercise per Week:   . Minutes of Exercise per Session:   Stress:   . Feeling of Stress :   Social Connections:   . Frequency of Communication with Friends and Family:   . Frequency of Social Gatherings with Friends and Family:   . Attends Religious Services:   . Active Member of Clubs or Organizations:   . Attends Archivist Meetings:   Marland Kitchen Marital Status:   Intimate Partner Violence:   . Fear of Current or Ex-Partner:   . Emotionally Abused:   Marland Kitchen Physically Abused:   . Sexually Abused:     No Known Allergies  Current Outpatient Medications  Medication Sig Dispense Refill  . B Complex-C-Folic Acid (DIALYVITE 229) 0.8 MG TABS Dialyvite 800    . carvedilol (COREG) 12.5 MG tablet Take 1 tablet (12.5 mg total) by mouth 2 (two) times daily with a meal. 60 tablet 0  . furosemide (LASIX) 80 MG tablet Take 1 tablet by mouth in the morning and at bedtime. Patient takes on days she does not have dialysis (Mon, Wed, Fri)    . heparin 1000 unit/mL SOLN injection Heparin Sodium (Porcine) 1,000 Units/mL Catheter Lock Arterial    . hyoscyamine (LEVSIN) 0.125 MG/5ML ELIX Take by mouth.    . iron sucrose in sodium chloride 0.9 % 100 mL Iron Sucrose (Venofer)    . letrozole (FEMARA) 2.5 MG tablet Take 1 tablet (2.5 mg total) by mouth daily. 90 tablet 3  . levothyroxine (SYNTHROID, LEVOTHROID) 125 MCG tablet Take 125 mcg by mouth daily before breakfast.    . Methoxy PEG-Epoetin Beta (MIRCERA IJ) Mircera    . rosuvastatin (CRESTOR) 40 MG tablet Take 40 mg by mouth at bedtime.      No current facility-administered medications for this visit.    REVIEW OF SYSTEMS:  [X]  denotes positive finding, [ ]  denotes negative finding Cardiac  Comments:  Chest pain or chest pressure:    Shortness of breath upon exertion:    Short of breath when lying flat:     Irregular heart rhythm:        Vascular    Pain in calf, thigh, or hip brought on by ambulation:    Pain in feet at night that wakes you up from your sleep:     Blood clot in your veins:    Leg swelling:         Pulmonary    Oxygen at home:  Productive cough:     Wheezing:         Neurologic    Sudden weakness in arms or legs:     Sudden numbness in arms or legs:     Sudden onset of difficulty speaking or slurred speech:    Temporary loss of vision in one eye:     Problems with dizziness:         Gastrointestinal    Blood in stool:     Vomited blood:         Genitourinary    Burning when urinating:     Blood in urine:        Psychiatric    Major depression:         Hematologic    Bleeding problems:    Problems with blood clotting too easily:        Skin    Rashes or ulcers:        Constitutional    Fever or chills:     PHYSICAL EXAM:   Vitals:   01/11/20 1047  BP: 111/74  Pulse: 83  Resp: 20  Temp: 98.2 F (36.8 C)  TempSrc: Temporal  SpO2: 97%  Weight: 82.6 kg  Height: 5\' 6"  (1.676 m)    GENERAL: The patient is a well-nourished female, in no acute distress. The vital signs are documented above. CARDIAC: There is a regular rate and rhythm.  VASCULAR: I do not detect carotid bruits. She has palpable brachial and radial pulses bilaterally. PULMONARY: There is good air exchange bilaterally without wheezing or rales. ABDOMEN: Soft and non-tender with normal pitched bowel sounds.  MUSCULOSKELETAL: There are no major deformities or cyanosis. NEUROLOGIC: No focal weakness or paresthesias are detected. SKIN: There are no ulcers or rashes noted. PSYCHIATRIC: The patient has a normal affect.  DATA:    ARTERIAL DUPLEX: I have independently interpreted her arterial duplex scan today.  On the right side there is a triphasic radial and ulnar waveform. The brachial artery bifurcates in the axilla.  On the left side, there is a triphasic radial and ulnar  waveform. The brachial artery measures 0.45 cm at the antecubital level. The bifurcation on the left is at the antecubital level.  UPPER EXTREMITY VEIN MAP: I have independently interpreted her upper extremity vein map.  On the left side the forearm and upper arm cephalic vein looked reasonable in size. The forearm cephalic vein has diameters ranging from 0.27-0.4 cm. The upper arm cephalic vein has diameters ranging from 0.36-0.54 cm. The basilic vein also looks reasonable to the mid upper arm.

## 2020-01-13 ENCOUNTER — Other Ambulatory Visit: Payer: Self-pay

## 2020-01-13 NOTE — Progress Notes (Signed)
Error

## 2020-01-18 ENCOUNTER — Other Ambulatory Visit: Payer: Self-pay

## 2020-02-17 ENCOUNTER — Encounter (HOSPITAL_COMMUNITY): Payer: Self-pay | Admitting: Vascular Surgery

## 2020-02-17 ENCOUNTER — Other Ambulatory Visit: Payer: Self-pay

## 2020-02-17 NOTE — Progress Notes (Signed)
Anesthesia Chart Review: Charlene Perry   Case: [id number] Date/Time: 02/21/20 0715   Procedure: ARTERIOVENOUS (AV) FISTULA CREATION (Left )   Anesthesia type: Monitor Anesthesia Care   Pre-op diagnosis: END STAGE RENAL DISEASE   Location: [place] OR ROOM 11 / [place] [facility]   Surgeons: Angelia Mould, MD      DISCUSSION: Patient is a 69 year old female scheduled for the above procedure. By notes, she is currently undergoing hemodialysis TTS via a Right IJ catheter that was placed in March. She needs permanent HD access. She hopes to eventually transition to peritoneal dialysis.   History includes former smoker, HTN, DM2, right breast cancer (s/p right mastectomy 1/6/20l right completion ALND & attempted Port-A-Cath insertion 07/13/18; left Port-a-cath 08/19/18; s/p chemoradiation), hypothyroidism, end-stage renal disease, vitiligo, cervical fusion. - Admission 09/02/19-09/09/19 with several days of N/V/D and diagnosed with sepsis/pyelonephritis with AKI (Cr 6.8). Required CRRT and intermittent HD. Right IJ tunneled catheter placed. Imaging showed lung nodule with follow-up advised (patient aware per H&P). Had + COVID-19 test 09/02/19---but PCCM notes indicate she actually had COVID in 06/2019, so she did not require specific treatment or precautions for COVID during her hospitalization.    Presurgical COVID-19 test is scheduled for 02/18/2020.  She is a same-day work-up, so labs and anesthesia team evaluation on the day of surgery.   VS:  BP Readings from Last 3 Encounters:  01/11/20 111/74  12/02/19 103/68  10/14/19 118/70   Pulse Readings from Last 3 Encounters:  01/11/20 83  12/02/19 96  10/14/19 86    PROVIDERS: [name], MD his PCP Donato Heinz, MD is nephrologist Kara Mead, MD is pulmonologist  [name], MD is HEM-ONC  LABS: For day of surgery. A1c 7.4% on 09/03/19, H/H 8.0/25.5 on 09/09/19.    Home Sleep Study 11/28/19: Impressions: Mild obstructive sleep  apnea with hypopneas causing oxygen desaturation. Recommendations: The cardiovascular consequences of mild OSA is debatable.  Treatment options for this degree of sleep disordered breathing include positional therapy and weight management alone.  Since she is very symptomatic with tiredness, oral appliance or CPAP therapy can be considered. - Per 12/13/19 telephone encounter by Dr. Elsworth Soho:  "HST showed mild OSA with AHI 13/ hr Suggest autoCPAP  5-15 cm, mask of choice OV with me/APP in 6 wks"   IMAGES: CXR 12/02/19: IMPRESSION: 1. Right lung peribronchial opacity, some of which seems present since March. This is nonspecific but suspicious for bronchopneumonia in the setting of cough. If unresolved following trial of antibiotic therapy then recommend follow-up Chest CT, which can be compared to 07/02/2018. 2. No other acute cardiopulmonary abnormality. - CXR done in urgent care for cough evaluation. Per Hall-Potvin, [place], PA-C, "Chest x-ray done office, reviewed by me radiology: Significant for right lung peribronchial opacity.  Has been present since March, though there is concern for bronchopneumonia in the setting of cough.  Recommended trial of antibiotic therapy and if no improvement CT scanning for follow-up.  Reviewed findings with patient verbalized understanding.  Patient already has PCP appointment in 2 weeks: We will keep and discuss imaging.  X-ray report provided.  Will start doxycycline in the interim."   EKG: 09/02/19: Sinus tachycardia at 113 bpm Low voltage, precordial leads Abnormal R-wave progression, early transition Abnormal inferior Q waves Confirmed by Davonna Belling [phone]) on 09/02/2019 10:39:04 PM - Rate is faster when compared to 12/39/19 tracing.   CV: Echo 07/02/18: Study Conclusions  - Procedure narrative: Transthoracic echocardiography. Image  quality was poor. The study was  technically difficult, as a  result of poor acoustic windows, poor sound  wave transmission,  and body habitus.  - Left ventricle: The cavity size was normal. Wall thickness was  increased in a pattern of mild LVH. Systolic function was normal.  The estimated ejection fraction was in the range of 60% to 65%.  Wall motion was normal; there were no regional wall motion  abnormalities. Doppler parameters are consistent with abnormal  left ventricular relaxation (grade 1 diastolic dysfunction).  - Aortic valve: Sclerosis without stenosis.  - Mitral valve: Calcified annulus. Mildly thickened leaflets .  - Atrial septum: No defect or patent foramen ovale was identified.  - Impressions: Normal GLS -17.8.    Past Medical History:  Diagnosis Date  . Arthritis   . Breast cancer in female [facility])    Right  . Cancer ([place])   . Diabetes mellitus without complication ([place])   . Family history of breast cancer   . Family history of colon cancer   . Hypertension    takes fluid pills  . Hypothyroidism   . Pink eye disease of left eye    went to eye md and received drops - yesterday.  . Renal disorder   . Sleep apnea    back in the 1990's  . Vitiligo     Past Surgical History:  Procedure Laterality Date  . AXILLARY LYMPH NODE DISSECTION Right 07/13/2018   Procedure: RIGHT AXILLARY LYMPH NODE DISSECTION;  Surgeon: [name], MD;  Location: [place];  Service: General;  Laterality: Right;  . BREAST BIOPSY Bilateral 01/2019  . BREAST SURGERY     left breast fibro adenoma  . CATARACT EXTRACTION Right 2019  . CERVICAL FUSION N/A    C5 &6  . CESAREAN SECTION    . CHOLECYSTECTOMY  2004-2005  . CYSTECTOMY     patient denies  . EYE SURGERY    . IR FLUORO GUIDE CV LINE RIGHT  09/07/2019  . IR IMAGING GUIDED PORT INSERTION  07/29/2018  . IR US GUIDE VASC ACCESS RIGHT  09/07/2019  . MASTECTOMY Right 06/21/2018  . MASTECTOMY WITH RADIOACTIVE SEED GUIDED EXCISION AND AXILLARY SENTINEL LYMPH NODE BIOPSY Right 06/21/2018  . MASTECTOMY WITH RADIOACTIVE SEED  GUIDED EXCISION AND AXILLARY SENTINEL LYMPH NODE BIOPSY Right 06/21/2018   Procedure: RIGHT TOTAL MASTECTOMY WITH RIGHT AXILLARY SENTINEL NODE BIOPSY AND RIGHT SEED GUIDED NODE EXCISION;  Surgeon: [name], MD;  Location: [place];  Service: General;  Laterality: Right;  . MYOMECTOMY  1986  . PARTIAL HYSTERECTOMY  1995  . PILONIDAL CYST EXCISION    . PORTACATH PLACEMENT N/A 07/13/2018   Procedure: INSERTION PORT-A-CATH WITH ULTRASOUND;  Surgeon: [name], MD;  Location: [place];  Service: General;  Laterality: N/A;  . TONSILLECTOMY  2002  . UVULOPALATOPHARYNGOPLASTY, TONSILLECTOMY AND SEPTOPLASTY      MEDICATIONS: No current facility-administered medications for this encounter.   . carvedilol (COREG) 12.5 MG tablet  . cetirizine (ZYRTEC) 10 MG tablet  . furosemide (LASIX) 80 MG tablet  . iron sucrose in sodium chloride 0.9 % 100 mL  . letrozole (FEMARA) 2.5 MG tablet  . levothyroxine (SYNTHROID, LEVOTHROID) 125 MCG tablet  . rosuvastatin (CRESTOR) 40 MG tablet  . Methoxy PEG-Epoetin Beta (MIRCERA IJ)     Myra Gianotti, PA-C Surgical Short Stay/Anesthesiology [facility] Phone [phone] [facility] Phone [phone] 02/17/2020 11:43 AM

## 2020-02-17 NOTE — Anesthesia Preprocedure Evaluation (Addendum)
Anesthesia Evaluation  Patient identified by MRN, date of birth, ID band Patient awake    Reviewed: Allergy & Precautions, NPO status , Patient's Chart, lab work & pertinent test results, reviewed documented beta blocker date and time   Airway Mallampati: III  TM Distance: >3 FB Neck ROM: Full    Dental  (+) Missing   Pulmonary sleep apnea and Continuous Positive Airway Pressure Ventilation , former smoker,    Pulmonary exam normal breath sounds clear to auscultation       Cardiovascular hypertension, Pt. on home beta blockers Normal cardiovascular exam Rhythm:Regular Rate:Normal     Neuro/Psych negative neurological ROS  negative psych ROS   GI/Hepatic negative GI ROS, Neg liver ROS,   Endo/Other  diabetes, Well Controlled, Type 2Hypothyroidism   Renal/GU ESRF and DialysisRenal disease (K+ 4.1) hemodialysis TTS via a Right IJ catheter      Musculoskeletal  (+) Arthritis ,   Abdominal   Peds  Hematology  (+) Blood dyscrasia, anemia ,   Anesthesia Other Findings right breast cancer (s/p right mastectomy 1/6/20l right completion ALND & attempted Port-A-Cath insertion 07/13/18; left Port-a-cath 08/19/18; s/p chemoradiation)  Reproductive/Obstetrics                           Anesthesia Physical Anesthesia Plan  ASA: III  Anesthesia Plan: MAC   Post-op Pain Management:    Induction: Intravenous  PONV Risk Score and Plan: 2 and Propofol infusion and Treatment may vary due to age or medical condition  Airway Management Planned: Nasal Cannula and Natural Airway  Additional Equipment:   Intra-op Plan:   Post-operative Plan:   Informed Consent: I have reviewed the patients History and Physical, chart, labs and discussed the procedure including the risks, benefits and alternatives for the proposed anesthesia with the patient or authorized representative who has indicated his/her understanding  and acceptance.       Plan Discussed with: CRNA  Anesthesia Plan Comments: (PAT note written 02/17/2020 by Myra Gianotti, PA-C. )      Anesthesia Quick Evaluation

## 2020-02-17 NOTE — Progress Notes (Signed)
Pt denies SOB, chest pain, and being under the care of a cardiologist. Pt denies having a stress test and cardiac cath. Pt made aware to stop taking vitamins, fish oil and herbal medications. Do not take any NSAIDs ie: Ibuprofen, Advil, Naproxen (Aleve), Motrin, BC and Goody Powder. Pt made aware to check CBG on DOS. Pt made aware to treat a CBG < 70 with 4 ounces of apple or cranberry juice, wait 15 minutes after intervention to recheck BG, if BG remains < 70, call Short Stay unit to speak with a nurse. Pt reminded to quarantine. Pt verbalized understanding of all pre-op instructions. PA, Anesthesiology, asked to review pt history, see note.

## 2020-02-18 ENCOUNTER — Other Ambulatory Visit (HOSPITAL_COMMUNITY)
Admission: RE | Admit: 2020-02-18 | Discharge: 2020-02-18 | Disposition: A | Discharge status: Home / Self Care | Payer: BC Managed Care – PPO | Source: Ambulatory Visit | Attending: Vascular Surgery | Admitting: Vascular Surgery

## 2020-02-18 DIAGNOSIS — Z20822 Contact with and (suspected) exposure to covid-19: Secondary | ICD-10-CM | POA: Diagnosis not present

## 2020-02-18 DIAGNOSIS — Z01812 Encounter for preprocedural laboratory examination: Secondary | ICD-10-CM | POA: Insufficient documentation

## 2020-02-18 LAB — SARS CORONAVIRUS 2 (TAT 6-24 HRS): SARS Coronavirus 2: NEGATIVE

## 2020-02-21 ENCOUNTER — Encounter (HOSPITAL_COMMUNITY)
Admission: RE | Disposition: A | Discharge status: Home / Self Care | Payer: Self-pay | Source: Home / Self Care | Attending: Vascular Surgery

## 2020-02-21 ENCOUNTER — Ambulatory Visit (HOSPITAL_COMMUNITY): Payer: BC Managed Care – PPO | Admitting: Vascular Surgery

## 2020-02-21 ENCOUNTER — Other Ambulatory Visit: Payer: Self-pay

## 2020-02-21 ENCOUNTER — Ambulatory Visit (HOSPITAL_COMMUNITY)
Admission: RE | Admit: 2020-02-21 | Discharge: 2020-02-21 | Disposition: A | Discharge status: Home / Self Care | Payer: BC Managed Care – PPO | Attending: Vascular Surgery | Admitting: Vascular Surgery

## 2020-02-21 ENCOUNTER — Encounter (HOSPITAL_COMMUNITY): Payer: Self-pay | Admitting: Vascular Surgery

## 2020-02-21 DIAGNOSIS — Z800 Family history of malignant neoplasm of digestive organs: Secondary | ICD-10-CM | POA: Insufficient documentation

## 2020-02-21 DIAGNOSIS — M1990 Unspecified osteoarthritis, unspecified site: Secondary | ICD-10-CM | POA: Insufficient documentation

## 2020-02-21 DIAGNOSIS — N186 End stage renal disease: Secondary | ICD-10-CM | POA: Insufficient documentation

## 2020-02-21 DIAGNOSIS — Z853 Personal history of malignant neoplasm of breast: Secondary | ICD-10-CM | POA: Diagnosis not present

## 2020-02-21 DIAGNOSIS — Z803 Family history of malignant neoplasm of breast: Secondary | ICD-10-CM | POA: Insufficient documentation

## 2020-02-21 DIAGNOSIS — Z87891 Personal history of nicotine dependence: Secondary | ICD-10-CM | POA: Diagnosis not present

## 2020-02-21 DIAGNOSIS — Z992 Dependence on renal dialysis: Secondary | ICD-10-CM | POA: Diagnosis not present

## 2020-02-21 DIAGNOSIS — E1122 Type 2 diabetes mellitus with diabetic chronic kidney disease: Secondary | ICD-10-CM | POA: Insufficient documentation

## 2020-02-21 DIAGNOSIS — N185 Chronic kidney disease, stage 5: Secondary | ICD-10-CM

## 2020-02-21 DIAGNOSIS — E039 Hypothyroidism, unspecified: Secondary | ICD-10-CM | POA: Diagnosis not present

## 2020-02-21 DIAGNOSIS — L80 Vitiligo: Secondary | ICD-10-CM | POA: Insufficient documentation

## 2020-02-21 DIAGNOSIS — Z9049 Acquired absence of other specified parts of digestive tract: Secondary | ICD-10-CM | POA: Insufficient documentation

## 2020-02-21 DIAGNOSIS — Z79899 Other long term (current) drug therapy: Secondary | ICD-10-CM | POA: Insufficient documentation

## 2020-02-21 DIAGNOSIS — I120 Hypertensive chronic kidney disease with stage 5 chronic kidney disease or end stage renal disease: Secondary | ICD-10-CM | POA: Diagnosis not present

## 2020-02-21 HISTORY — PX: AV FISTULA PLACEMENT: SHX1204

## 2020-02-21 LAB — POCT I-STAT, CHEM 8
BUN: 28 mg/dL — ABNORMAL HIGH (ref 8–23)
Calcium, Ion: 1.12 mmol/L — ABNORMAL LOW (ref 1.15–1.40)
Chloride: 101 mmol/L (ref 98–111)
Creatinine, Ser: 4.9 mg/dL — ABNORMAL HIGH (ref 0.44–1.00)
Glucose, Bld: 134 mg/dL — ABNORMAL HIGH (ref 70–99)
HCT: 36 % (ref 36.0–46.0)
Hemoglobin: 12.2 g/dL (ref 12.0–15.0)
Potassium: 4.1 mmol/L (ref 3.5–5.1)
Sodium: 137 mmol/L (ref 135–145)
TCO2: 26 mmol/L (ref 22–32)

## 2020-02-21 SURGERY — ARTERIOVENOUS (AV) FISTULA CREATION
Anesthesia: Monitor Anesthesia Care | Site: Arm Upper | Laterality: Left

## 2020-02-21 MED ORDER — PAPAVERINE HCL 30 MG/ML IJ SOLN
INTRAMUSCULAR | Status: AC
  Filled 2020-02-21: qty 2

## 2020-02-21 MED ORDER — ACETAMINOPHEN 500 MG PO TABS
1000.0000 mg | ORAL_TABLET | Freq: Once | ORAL | Status: AC
  Administered 2020-02-21: 1000 mg via ORAL
  Filled 2020-02-21: qty 2

## 2020-02-21 MED ORDER — CHLORHEXIDINE GLUCONATE 0.12 % MT SOLN
OROMUCOSAL | Status: AC
  Administered 2020-02-21: 15 mL
  Filled 2020-02-21: qty 15

## 2020-02-21 MED ORDER — PAPAVERINE HCL 30 MG/ML IJ SOLN
INTRAMUSCULAR | Status: DC | PRN
  Administered 2020-02-21: 60 mg via INTRAVENOUS

## 2020-02-21 MED ORDER — PROPOFOL 500 MG/50ML IV EMUL
INTRAVENOUS | Status: DC | PRN
  Administered 2020-02-21: 75 ug/kg/min via INTRAVENOUS

## 2020-02-21 MED ORDER — ONDANSETRON HCL 4 MG/2ML IJ SOLN
INTRAMUSCULAR | Status: DC | PRN
  Administered 2020-02-21: 4 mg via INTRAVENOUS

## 2020-02-21 MED ORDER — FENTANYL CITRATE (PF) 250 MCG/5ML IJ SOLN
INTRAMUSCULAR | Status: AC
  Filled 2020-02-21: qty 5

## 2020-02-21 MED ORDER — SODIUM CHLORIDE 0.9 % IV SOLN
INTRAVENOUS | Status: AC
  Filled 2020-02-21: qty 1.2

## 2020-02-21 MED ORDER — FENTANYL CITRATE (PF) 100 MCG/2ML IJ SOLN: 25.0000 ug | INTRAMUSCULAR | Status: DC | PRN

## 2020-02-21 MED ORDER — SODIUM CHLORIDE 0.9 % IV SOLN
INTRAVENOUS | Status: DC | PRN
  Administered 2020-02-21: 500 mL

## 2020-02-21 MED ORDER — LIDOCAINE-EPINEPHRINE (PF) 1 %-1:200000 IJ SOLN
INTRAMUSCULAR | Status: DC | PRN
  Administered 2020-02-21: 30 mL

## 2020-02-21 MED ORDER — PROTAMINE SULFATE 10 MG/ML IV SOLN
INTRAVENOUS | Status: DC | PRN
  Administered 2020-02-21: 40 mg via INTRAVENOUS

## 2020-02-21 MED ORDER — PROPOFOL 10 MG/ML IV BOLUS
INTRAVENOUS | Status: DC | PRN
  Administered 2020-02-21: 20 mg via INTRAVENOUS

## 2020-02-21 MED ORDER — LIDOCAINE HCL (PF) 1 % IJ SOLN
INTRAMUSCULAR | Status: AC
  Filled 2020-02-21: qty 30

## 2020-02-21 MED ORDER — ONDANSETRON HCL 4 MG/2ML IJ SOLN: 4.0000 mg | Freq: Once | INTRAMUSCULAR | Status: DC | PRN

## 2020-02-21 MED ORDER — PHENYLEPHRINE HCL-NACL 10-0.9 MG/250ML-% IV SOLN
INTRAVENOUS | Status: DC | PRN
  Administered 2020-02-21: 30 ug/min via INTRAVENOUS

## 2020-02-21 MED ORDER — PHENYLEPHRINE 40 MCG/ML (10ML) SYRINGE FOR IV PUSH (FOR BLOOD PRESSURE SUPPORT)
PREFILLED_SYRINGE | INTRAVENOUS | Status: DC | PRN
  Administered 2020-02-21 (×2): 80 ug via INTRAVENOUS

## 2020-02-21 MED ORDER — 0.9 % SODIUM CHLORIDE (POUR BTL) OPTIME
TOPICAL | Status: DC | PRN
  Administered 2020-02-21: 1000 mL

## 2020-02-21 MED ORDER — DEXAMETHASONE SODIUM PHOSPHATE 10 MG/ML IJ SOLN
INTRAMUSCULAR | Status: DC | PRN
  Administered 2020-02-21: 5 mg via INTRAVENOUS

## 2020-02-21 MED ORDER — SODIUM CHLORIDE 0.9 % IV SOLN: INTRAVENOUS | Status: DC

## 2020-02-21 MED ORDER — MIDAZOLAM HCL 2 MG/2ML IJ SOLN
INTRAMUSCULAR | Status: DC | PRN
  Administered 2020-02-21: 2 mg via INTRAVENOUS

## 2020-02-21 MED ORDER — CHLORHEXIDINE GLUCONATE 4 % EX LIQD: 60.0000 mL | Freq: Once | CUTANEOUS | Status: DC

## 2020-02-21 MED ORDER — OXYCODONE HCL 5 MG PO TABS: 5.0000 mg | ORAL_TABLET | ORAL | 0 refills | Status: DC | PRN

## 2020-02-21 MED ORDER — CEFAZOLIN SODIUM-DEXTROSE 2-4 GM/100ML-% IV SOLN
2.0000 g | INTRAVENOUS | Status: AC
  Administered 2020-02-21: 2 g via INTRAVENOUS
  Filled 2020-02-21: qty 100

## 2020-02-21 MED ORDER — MIDAZOLAM HCL 2 MG/2ML IJ SOLN
INTRAMUSCULAR | Status: AC
  Filled 2020-02-21: qty 2

## 2020-02-21 MED ORDER — HEPARIN SODIUM (PORCINE) 1000 UNIT/ML IJ SOLN
INTRAMUSCULAR | Status: DC | PRN
  Administered 2020-02-21: 8000 [IU] via INTRAVENOUS

## 2020-02-21 MED ORDER — PROPOFOL 10 MG/ML IV BOLUS
INTRAVENOUS | Status: AC
  Filled 2020-02-21: qty 20

## 2020-02-21 MED ORDER — LIDOCAINE-EPINEPHRINE (PF) 1 %-1:200000 IJ SOLN
INTRAMUSCULAR | Status: AC
  Filled 2020-02-21: qty 30

## 2020-02-21 SURGICAL SUPPLY — 36 items
ADH SKN CLS APL DERMABOND .7 (GAUZE/BANDAGES/DRESSINGS) ×1
ARMBAND PINK RESTRICT EXTREMIT (MISCELLANEOUS) ×6 IMPLANT
CANISTER SUCT 3000ML PPV (MISCELLANEOUS) ×3 IMPLANT
CANNULA VESSEL 3MM 2 BLNT TIP (CANNULA) ×5 IMPLANT
CLIP VESOCCLUDE MED 6/CT (CLIP) ×3 IMPLANT
CLIP VESOCCLUDE SM WIDE 6/CT (CLIP) ×3 IMPLANT
COVER PROBE W GEL 5X96 (DRAPES) IMPLANT
COVER WAND RF STERILE (DRAPES) ×3 IMPLANT
DECANTER SPIKE VIAL GLASS SM (MISCELLANEOUS) ×3 IMPLANT
DERMABOND ADVANCED (GAUZE/BANDAGES/DRESSINGS) ×2
DERMABOND ADVANCED .7 DNX12 (GAUZE/BANDAGES/DRESSINGS) ×1 IMPLANT
ELECT REM PT RETURN 9FT ADLT (ELECTROSURGICAL) ×3
ELECTRODE REM PT RTRN 9FT ADLT (ELECTROSURGICAL) ×1 IMPLANT
GLOVE BIO SURGEON STRL SZ7.5 (GLOVE) ×3 IMPLANT
GLOVE BIOGEL PI IND STRL 6.5 (GLOVE) IMPLANT
GLOVE BIOGEL PI IND STRL 7.5 (GLOVE) IMPLANT
GLOVE BIOGEL PI IND STRL 8 (GLOVE) ×1 IMPLANT
GLOVE BIOGEL PI INDICATOR 6.5 (GLOVE) ×6
GLOVE BIOGEL PI INDICATOR 7.5 (GLOVE) ×2
GLOVE BIOGEL PI INDICATOR 8 (GLOVE) ×2
GLOVE ECLIPSE 7.0 STRL STRAW (GLOVE) ×2 IMPLANT
GOWN STRL REUS W/ TWL LRG LVL3 (GOWN DISPOSABLE) ×3 IMPLANT
GOWN STRL REUS W/TWL LRG LVL3 (GOWN DISPOSABLE) ×9
KIT BASIN OR (CUSTOM PROCEDURE TRAY) ×3 IMPLANT
KIT TURNOVER KIT B (KITS) ×3 IMPLANT
NS IRRIG 1000ML POUR BTL (IV SOLUTION) ×3 IMPLANT
PACK CV ACCESS (CUSTOM PROCEDURE TRAY) ×3 IMPLANT
PAD ARMBOARD 7.5X6 YLW CONV (MISCELLANEOUS) ×6 IMPLANT
SPONGE SURGIFOAM ABS GEL 100 (HEMOSTASIS) IMPLANT
SUT PROLENE 6 0 BV (SUTURE) ×3 IMPLANT
SUT VIC AB 3-0 SH 27 (SUTURE) ×3
SUT VIC AB 3-0 SH 27X BRD (SUTURE) ×1 IMPLANT
SUT VICRYL 4-0 PS2 18IN ABS (SUTURE) ×3 IMPLANT
TOWEL GREEN STERILE (TOWEL DISPOSABLE) ×3 IMPLANT
UNDERPAD 30X36 HEAVY ABSORB (UNDERPADS AND DIAPERS) ×3 IMPLANT
WATER STERILE IRR 1000ML POUR (IV SOLUTION) ×3 IMPLANT

## 2020-02-21 NOTE — H&P (Signed)
CC:    For new hemodialysis.  ASSESSMENT & PLAN:   END-STAGE RENAL DISEASE: Based on the patient's vein map she appears to be a candidate for a left radiocephalic fistula or left brachiocephalic fistula.  She does not want to have access in the right arm given her previous breast surgery and lymph node dissection.  In addition she has a high bifurcation of the brachial artery on the right.  We have previously discussed the indications for the procedure and the potential complications.  She also plans on doing peritoneal dialysis.  The fistula would be for a backup.  She presents now for elective surgery.  [name], MD Office: [phone]   HPI:   Charlene Perry is a pleasant 69 y.o. female, who presented on 01/11/2020 to be evaluated for hemodialysis access.  She had a tunneled dialysis catheter that was placed in March.  She had been admitted to the hospital with sepsis and developed renal insufficiency.  She dialyzes on Tuesdays Thursdays and Saturdays.  She would like to do peritoneal dialysis but a catheter had not been placed yet.  She does not have a pacemaker.  She is not on blood thinners.  Past Medical History:  Diagnosis Date  . Arthritis   . Breast cancer in female [facility]    Right  . Cancer ([place])   . Diabetes mellitus without complication ([place])    not on medication  . Family history of breast cancer   . Family history of colon cancer   . Hypertension    takes fluid pills  . Hypothyroidism   . Pink eye disease of left eye    went to eye md and received drops - yesterday.  . Renal disorder   . Sleep apnea    back in the 1990's  . Vitiligo     Family History  Problem Relation Age of Onset  . Colon cancer Mother 61  . Colon cancer Sister 98  . Colon cancer Brother        ex early 83's  . Breast cancer Other        40's    SOCIAL HISTORY: Social History   Socioeconomic History  . Marital status: Married    Spouse name: Not on file  . Number  of children: 1  . Years of education: Not on file  . Highest education level: Not on file  Occupational History  . Not on file  Tobacco Use  . Smoking status: Former Smoker    Types: Cigarettes  . Smokeless tobacco: Never Used  . Tobacco comment: Quit 35 years ago  Vaping Use  . Vaping Use: Never used  Substance and Sexual Activity  . Alcohol use: Not Currently  . Drug use: Never  . Sexual activity: Not on file  Other Topics Concern  . Not on file  Social History Narrative  . Not on file   Social Determinants of Health   Financial Resource Strain:   . Difficulty of Paying Living Expenses: Not on file  Food Insecurity:   . Worried About Charity fundraiser in the Last Year: Not on file  . Ran Out of Food in the Last Year: Not on file  Transportation Needs:   . Lack of Transportation (Medical): Not on file  . Lack of Transportation (Non-Medical): Not on file  Physical Activity:   . Days of Exercise per Week: Not on file  . Minutes of Exercise per Session: Not on file  Stress:   .  Feeling of Stress : Not on file  Social Connections:   . Frequency of Communication with Friends and Family: Not on file  . Frequency of Social Gatherings with Friends and Family: Not on file  . Attends Religious Services: Not on file  . Active Member of Clubs or Organizations: Not on file  . Attends Archivist Meetings: Not on file  . Marital Status: Not on file  Intimate Partner Violence:   . Fear of Current or Ex-Partner: Not on file  . Emotionally Abused: Not on file  . Physically Abused: Not on file  . Sexually Abused: Not on file    No Known Allergies  Current Facility-Administered Medications  Medication Dose Route Frequency Provider Last Rate Last Admin  . 0.9 %  sodium chloride infusion   Intravenous Continuous Angelia Mould, MD      . ceFAZolin (ANCEF) IVPB 2g/100 mL premix  2 g Intravenous 30 min Pre-Op Angelia Mould, MD      . chlorhexidine  (HIBICLENS) 4 % liquid 4 application  60 mL Topical Once Angelia Mould, MD       And  . Derrill Memo ON 02/22/2020] chlorhexidine (HIBICLENS) 4 % liquid 4 application  60 mL Topical Once Angelia Mould, MD        REVIEW OF SYSTEMS:  [X]  denotes positive finding, [ ]  denotes negative finding Cardiac  Comments:  Chest pain or chest pressure:    Shortness of breath upon exertion:    Short of breath when lying flat:    Irregular heart rhythm:        Vascular    Pain in calf, thigh, or hip brought on by ambulation:    Pain in feet at night that wakes you up from your sleep:     Blood clot in your veins:    Leg swelling:         Pulmonary    Oxygen at home:    Productive cough:     Wheezing:         Neurologic    Sudden weakness in arms or legs:     Sudden numbness in arms or legs:     Sudden onset of difficulty speaking or slurred speech:    Temporary loss of vision in one eye:     Problems with dizziness:         Gastrointestinal    Blood in stool:     Vomited blood:         Genitourinary    Burning when urinating:     Blood in urine:        Psychiatric    Major depression:         Hematologic    Bleeding problems:    Problems with blood clotting too easily:        Skin    Rashes or ulcers:        Constitutional    Fever or chills:     PHYSICAL EXAM:   Vitals:   02/21/20 0623  BP: (!) 129/50  Pulse: 88  Resp: 20  Temp: 97.6 F (36.4 C)  TempSrc: Temporal  SpO2: 99%  Weight: 82.6 kg  Height: 5\' 6"  (1.676 m)    GENERAL: The patient is a well-nourished female, in no acute distress. The vital signs are documented above. CARDIAC: There is a regular rate and rhythm.  VASCULAR: I do not detect carotid bruits. She has palpable brachial and radial pulses bilaterally. PULMONARY: There  is good air exchange bilaterally without wheezing or rales. ABDOMEN: Soft and non-tender with normal pitched bowel sounds.  MUSCULOSKELETAL: There are no major  deformities or cyanosis. NEUROLOGIC: No focal weakness or paresthesias are detected. SKIN: There are no ulcers or rashes noted. PSYCHIATRIC: The patient has a normal affect.  DATA:    ARTERIAL DUPLEX: I have independently interpreted her arterial duplex scan today.  On the right side there is a triphasic radial and ulnar waveform. The brachial artery bifurcates in the axilla.  On the left side, there is a triphasic radial and ulnar waveform. The brachial artery measures 0.45 cm at the antecubital level. The bifurcation on the left is at the antecubital level.  UPPER EXTREMITY VEIN MAP: I have independently interpreted her upper extremity vein map.  On the left side the forearm and upper arm cephalic vein looked reasonable in size. The forearm cephalic vein has diameters ranging from 0.27-0.4 cm. The upper arm cephalic vein has diameters ranging from 0.36-0.54 cm. The basilic vein also looks reasonable to the mid upper arm.

## 2020-02-21 NOTE — Anesthesia Postprocedure Evaluation (Signed)
Anesthesia Post Note  Patient: Charlene Perry [facility]  Procedure(s) Performed: CREATION of LEFT ARM BRACHIOCEPHALIC FISTULA (Left Arm Upper)     Patient location during evaluation: PACU Anesthesia Type: MAC Level of consciousness: awake and alert and awake Pain management: pain level controlled Vital Signs Assessment: post-procedure vital signs reviewed and stable Respiratory status: spontaneous breathing, nonlabored ventilation, respiratory function stable and patient connected to nasal cannula oxygen Cardiovascular status: stable and blood pressure returned to baseline Postop Assessment: no apparent nausea or vomiting Anesthetic complications: no   No complications documented.  Last Vitals:  Vitals:   02/21/20 0900 02/21/20 0912  BP: (!) 148/77 (!) 148/77  Pulse: 84   Resp: 19   Temp:  36.4 C  SpO2: 99%     Last Pain:  Vitals:   02/21/20 0912  TempSrc:   PainSc: 0-No pain                 Catalina Gravel

## 2020-02-21 NOTE — Op Note (Signed)
    NAME: Charlene Perry    MRN: [id number] DOB: 04-21-1951    DATE OF OPERATION: 02/21/2020  PREOP DIAGNOSIS:    End-stage renal disease  POSTOP DIAGNOSIS:    Same  PROCEDURE:    Left brachiocephalic AV fistula  SURGEON: Judeth Cornfield. Scot Dock, MD  ASSIST: Laurence Slate  ANESTHESIA: Local with sedation  EBL: Minimal  INDICATIONS:    Charlene Perry is a 69 y.o. female who presents for new access.  She has a functioning catheter.  She is going to have a peritoneal dialysis catheter placed also  FINDINGS:   3 mm upper arm cephalic vein good thrill at the completion of the procedure with a good radial and ulnar signal with the Doppler.  TECHNIQUE:   The patient was taken to the operating room and sedated by anesthesia.  The left arm was prepped and draped in usual sterile fashion.  After the skin was anesthetized with 1% lidocaine a transverse incision was made at the antecubital level.  Here the cephalic vein was dissected free.  It was a 3 mm vein.  The brachial artery was dissected free beneath the fascia and the patient was heparinized.  The vein was ligated distally and spatulated proximally.  The artery was clamped proximally and distally and a longitudinal arteriotomy was made.  The vein was then sewn end-to-side to the artery using continuous 6-0 Prolene suture.  At the completion it was a good thrill in the fistula.  There was a radial and ulnar signal with Doppler.  The heparin was partially reversed with protamine.  The wound was then closed with a deep layer of 3-0 Vicryl and the skin closed with 4-0 Vicryl.  Dermabond was applied.  The patient tolerated the procedure well was transferred to the recovery room in stable condition.  All needle and sponge counts were correct.  Given the complexity of the case a first assistant was necessary in order to expedient the procedure and safely perform the technical aspects of the operation.  [name], MD,  FACS [facility]  DATE OF DICTATION:   02/21/2020

## 2020-02-21 NOTE — Transfer of Care (Signed)
Immediate Anesthesia Transfer of Care Note  Patient: Charlene Perry [facility]  Procedure(s) Performed: CREATION of LEFT ARM BRACHIOCEPHALIC FISTULA (Left Arm Upper)  Patient Location: PACU  Anesthesia Type:MAC  Level of Consciousness: drowsy and patient cooperative  Airway & Oxygen Therapy: Patient Spontanous Breathing  Post-op Assessment: Report given to RN, Post -op Vital signs reviewed and stable and Patient moving all extremities X 4  Post vital signs: Reviewed and stable  Last Vitals:  Vitals Value Taken Time  BP    Temp    Pulse    Resp    SpO2      Last Pain:  Vitals:   02/21/20 0623  TempSrc: Temporal  PainSc:       Patients Stated Pain Goal: 3 (33/38/32 9191)  Complications: No complications documented.

## 2020-02-22 ENCOUNTER — Encounter (HOSPITAL_COMMUNITY): Payer: Self-pay | Admitting: Vascular Surgery

## 2020-03-07 ENCOUNTER — Other Ambulatory Visit: Payer: Self-pay | Admitting: Hematology and Oncology

## 2020-03-15 ENCOUNTER — Other Ambulatory Visit: Payer: Self-pay

## 2020-03-15 DIAGNOSIS — Z992 Dependence on renal dialysis: Secondary | ICD-10-CM

## 2020-04-04 ENCOUNTER — Other Ambulatory Visit: Payer: Self-pay

## 2020-04-04 ENCOUNTER — Ambulatory Visit (INDEPENDENT_AMBULATORY_CARE_PROVIDER_SITE_OTHER): Payer: Self-pay | Admitting: Physician Assistant

## 2020-04-04 ENCOUNTER — Ambulatory Visit (HOSPITAL_COMMUNITY)
Admission: RE | Admit: 2020-04-04 | Discharge: 2020-04-04 | Disposition: A | Discharge status: Home / Self Care | Payer: BC Managed Care – PPO | Source: Ambulatory Visit | Attending: Physician Assistant | Admitting: Physician Assistant

## 2020-04-04 VITALS — BP 170/121 | HR 100 | Temp 97.7°F | Resp 20 | Ht 66.0 in | Wt 180.9 lb

## 2020-04-04 DIAGNOSIS — Z992 Dependence on renal dialysis: Secondary | ICD-10-CM

## 2020-04-04 DIAGNOSIS — N186 End stage renal disease: Secondary | ICD-10-CM | POA: Diagnosis present

## 2020-04-04 NOTE — Progress Notes (Signed)
POST OPERATIVE DIALYSIS ACCESS OFFICE NOTE    CC:  F/u for dialysis access surgery  HPI:  This is a 69 y.o. female who is s/p left brachiocephalic AVF by Dr. Scot Dock on 02/21/2020. She reports mild hand tingling, numbness and cool sensation. She denies hand pain or clumsiness. She states she underwent PD cath placement on 03/26/2020.  She is to start PD training at [facility]. on November 1st. She is currently dialyzing via right IJ tunneled dialysis catheter placed by IR without complications.   Dialysis days:  TTS   Dialysis center:  [facility]  No Known Allergies  Current Outpatient Medications  Medication Sig Dispense Refill  . aspirin 81 MG chewable tablet Chew by mouth.    . carvedilol (COREG) 12.5 MG tablet Take 1 tablet (12.5 mg total) by mouth 2 (two) times daily with a meal. (Patient taking differently: Take 12.5 mg by mouth daily as needed (If bp is above 120). Take 12.5 mg twice a day if Bp is over 120) 60 tablet 0  . cetirizine (ZYRTEC) 10 MG tablet Take 10 mg by mouth daily.    . furosemide (LASIX) 80 MG tablet Take 80 mg by mouth daily as needed for fluid. On Dialysis days    . gentamicin cream (GARAMYCIN) 0.1 % Apply topically.    . iron sucrose in sodium chloride 0.9 % 100 mL Inject 100 mg into the vein every 30 (thirty) days.     Marland Kitchen letrozole (FEMARA) 2.5 MG tablet Take 1 tablet by mouth once daily 90 tablet 0  . levothyroxine (SYNTHROID, LEVOTHROID) 125 MCG tablet Take 125 mcg by mouth daily before breakfast.    . Methoxy PEG-Epoetin Beta (MIRCERA IJ) Mircera    . oxyCODONE (ROXICODONE) 5 MG immediate release tablet Take 1 tablet (5 mg total) by mouth every 4 (four) hours as needed. 15 tablet 0  . rosuvastatin (CRESTOR) 40 MG tablet Take 40 mg by mouth at bedtime.      No current facility-administered medications for this visit.     ROS:  See HPI  BP (!) 170/121 (BP Location: Right Leg, Patient Position: Sitting, Cuff Size: Normal)   Pulse 100   Temp 97.7  F (36.5 C) (Temporal)   Resp 20   Ht 5\' 6"  (1.676 m)   Wt 180 lb 14.4 oz (82.1 kg)   SpO2 97%   BMI 29.20 kg/m    Physical Exam:  General appearance: Developed, well-nourished in no apparent distress Cardiac: Rate and rhythm are regular Respiratory: Nonlabored Incision: Well-healed Extremities: Left upper extremity: Hand is warm and pink. 5 out of 5 grip strength. Sensation is intact. I cannot palpate her her radial or ulnar pulses with certainty. She has a good bruit and thrill in her fistula and it is easily palpable. She has brisk radial, ulnar and palmar arch biphasic Doppler signals. Her radial pulse does augment somewhat with compression of her fistula.  Dialysis duplex on 04/04/2020 Flow volume approximately 1900 mL/min Depth of the fistula varies from 0.16-0.65 distally. Diameter of the fistula ranges 0.5-2 0.86 cm  Assessment/Plan:   -pt does not have evidence of steal syndrome. Her radial pulse is not palpable but she has strong Doppler signals. -dialysis duplex today reveals fistula to be of adequate diameter and depth for access -the fistula/graft may be used starting December 1 -Because there is likelihood that she may not use her fistula for hemodialysis, I recommend  follow-up in approximately 6 weeks to reevaluate her symptoms and repeat  duplex study.  Barbie Banner, PA-C 04/04/2020 2:18 PM [facility] [phone]  Clinic MD:  Oneida Alar

## 2020-04-09 ENCOUNTER — Other Ambulatory Visit: Payer: Self-pay

## 2020-04-09 DIAGNOSIS — N186 End stage renal disease: Secondary | ICD-10-CM

## 2020-05-21 ENCOUNTER — Ambulatory Visit (HOSPITAL_COMMUNITY)
Admission: RE | Admit: 2020-05-21 | Discharge: 2020-05-21 | Disposition: A | Discharge status: Home / Self Care | Payer: BC Managed Care – PPO | Source: Ambulatory Visit | Attending: Physician Assistant | Admitting: Physician Assistant

## 2020-05-21 ENCOUNTER — Ambulatory Visit (INDEPENDENT_AMBULATORY_CARE_PROVIDER_SITE_OTHER): Payer: Self-pay | Admitting: Physician Assistant

## 2020-05-21 ENCOUNTER — Other Ambulatory Visit: Payer: Self-pay

## 2020-05-21 VITALS — BP 211/110 | HR 96 | Temp 97.9°F | Resp 20 | Ht 66.0 in | Wt 188.3 lb

## 2020-05-21 DIAGNOSIS — Z992 Dependence on renal dialysis: Secondary | ICD-10-CM | POA: Diagnosis present

## 2020-05-21 DIAGNOSIS — N186 End stage renal disease: Secondary | ICD-10-CM | POA: Insufficient documentation

## 2020-05-21 NOTE — Progress Notes (Signed)
    Postoperative Access Visit   History of Present Illness   Charlene Perry is a 69 y.o. year old female who presents for postoperative follow-up for: left brachiocephalic arteriovenous fistula by Dr. Scot Dock (Date: 02/21/20).  She is being re-evaluated for steal syndrome.  She complains of L hand numbness, cold feeling, and occasional cramping of L 2nd and 3rd fingers.  She denies any wounds to her finger tips.  She has since been started on peritoneal dialysis and is scheduled to have R IJ TDC removed soon.  She states symptoms of L hand are annoying but tolerable and have not gotten any worse since last office visit.  Physical Examination   Vitals:   05/21/20 1014  BP: (!) 211/110  Pulse: 96  Resp: 20  Temp: 97.9 F (36.6 C)  TempSrc: Temporal  SpO2: 99%  Weight: 188 lb 4.8 oz (85.4 kg)  Height: 5\' 6"  (1.676 m)   Body mass index is 30.39 kg/m.  left arm Incision is  healed, palpable radial pulse, palmar arch doppler signal augments with compression of fistula; hand grip is 5/5, sensation in digits is intact, palpable thrill    Medical Decision Making   Charlene Perry is a 69 y.o. year old female who presents s/p left brachiocephalic arteriovenous fistula   Patent L brachiocephalic fistula with positive steal study  Patient was offered ligation of L arm AVF  At this time symptoms are tolerable and she is not interested in any further surgery  I re-assured the patient she has adequate circulation to L hand with a palpable radial pulse and a palmar arch signal by doppler.  I also encouraged her to keep her L arm down by her side and to exercise her L arm and hand when possible  Patient will call to schedule ligation of L arm fistula if symptoms worsen   Dagoberto Ligas PA-C [facility] Office: [facility] MD: Trula Slade

## 2020-06-29 IMAGING — DX DG CHEST 2V
2 series · 2 of 2 positions shown · non-contrast
Comparison: Portable chest 09/03/2019 and earlier.

CLINICAL DATA: 69-year-old female with cough for 3 days. Dialysis.
History of right side breast cancer.

EXAM:
CHEST - 2 VIEW

[chest pa]
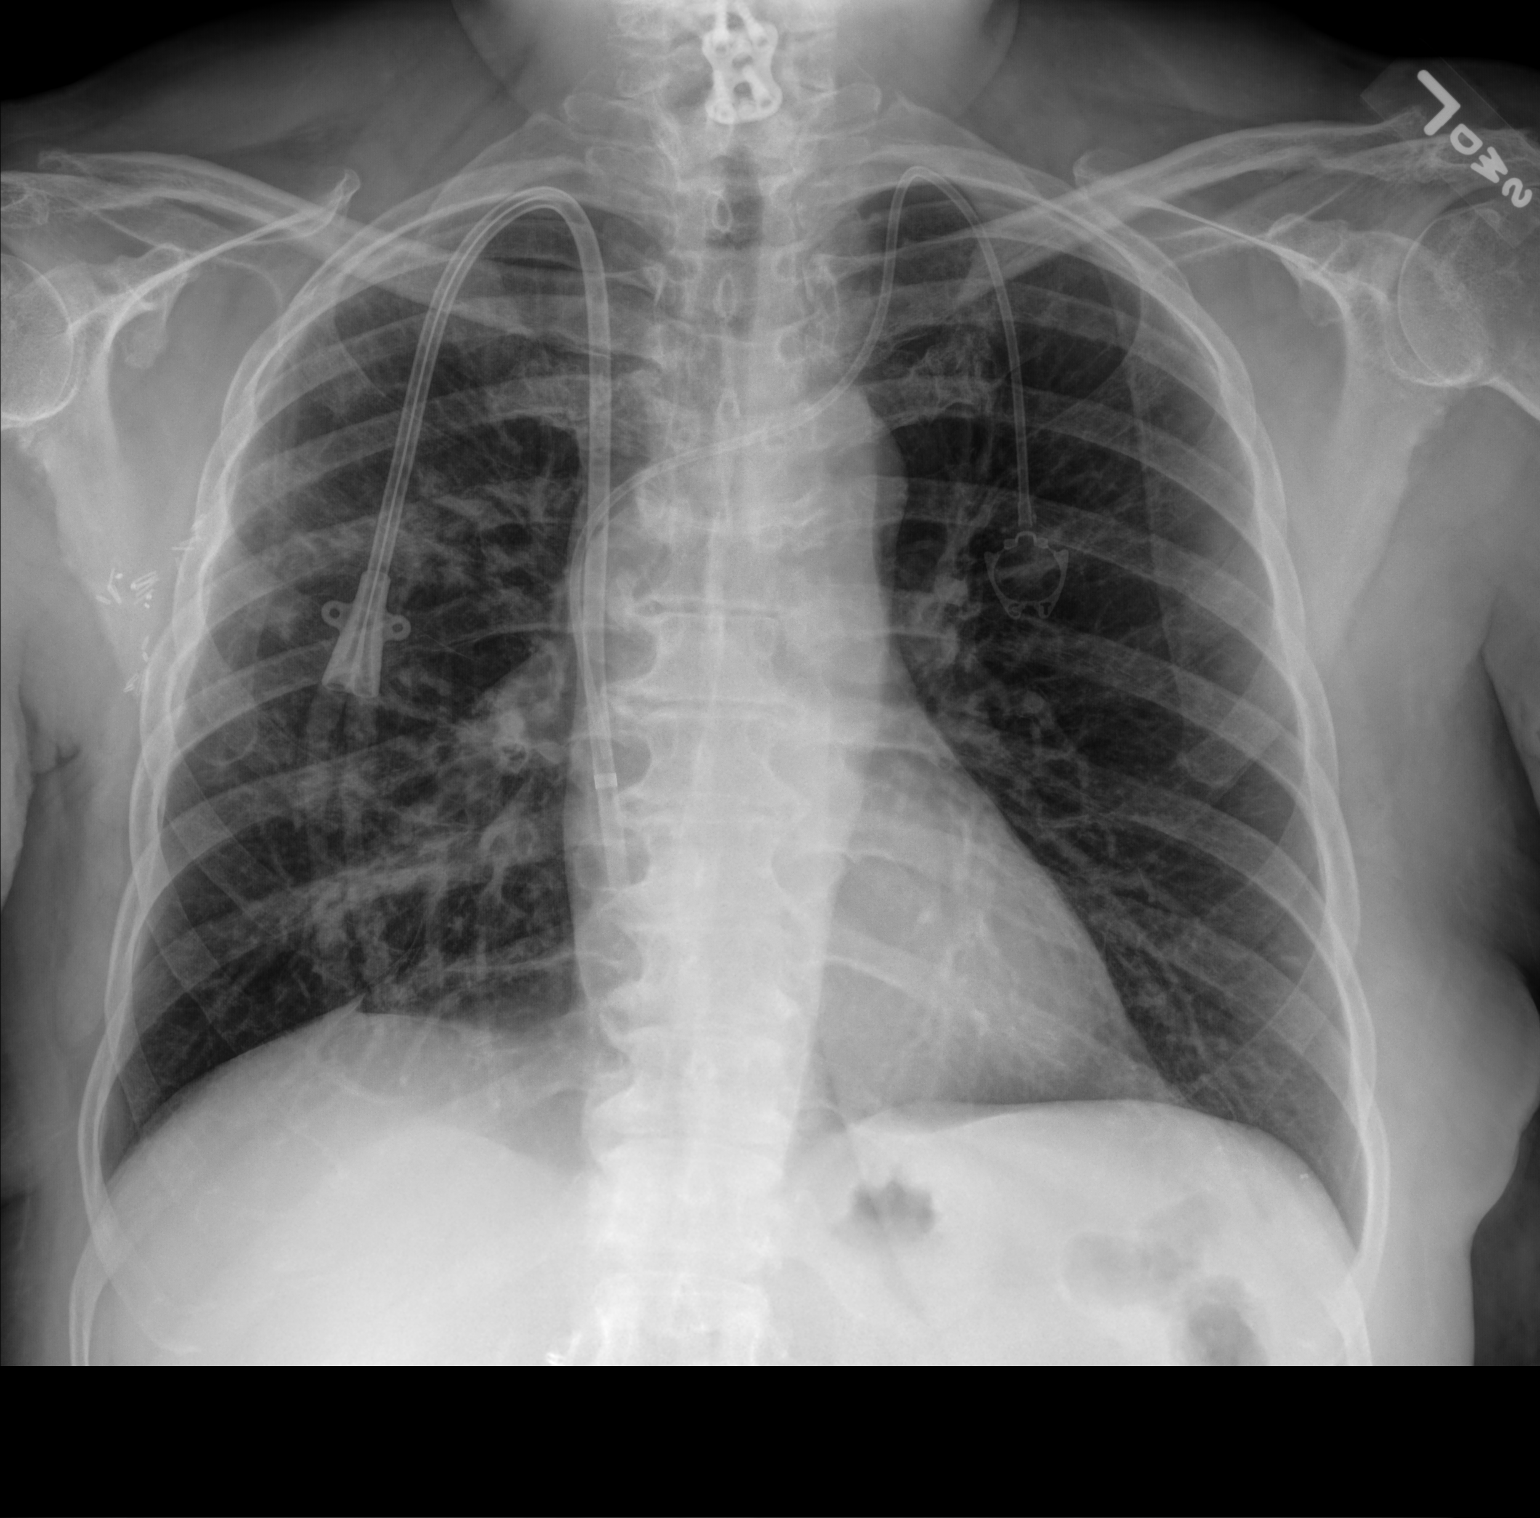

[chest lat]
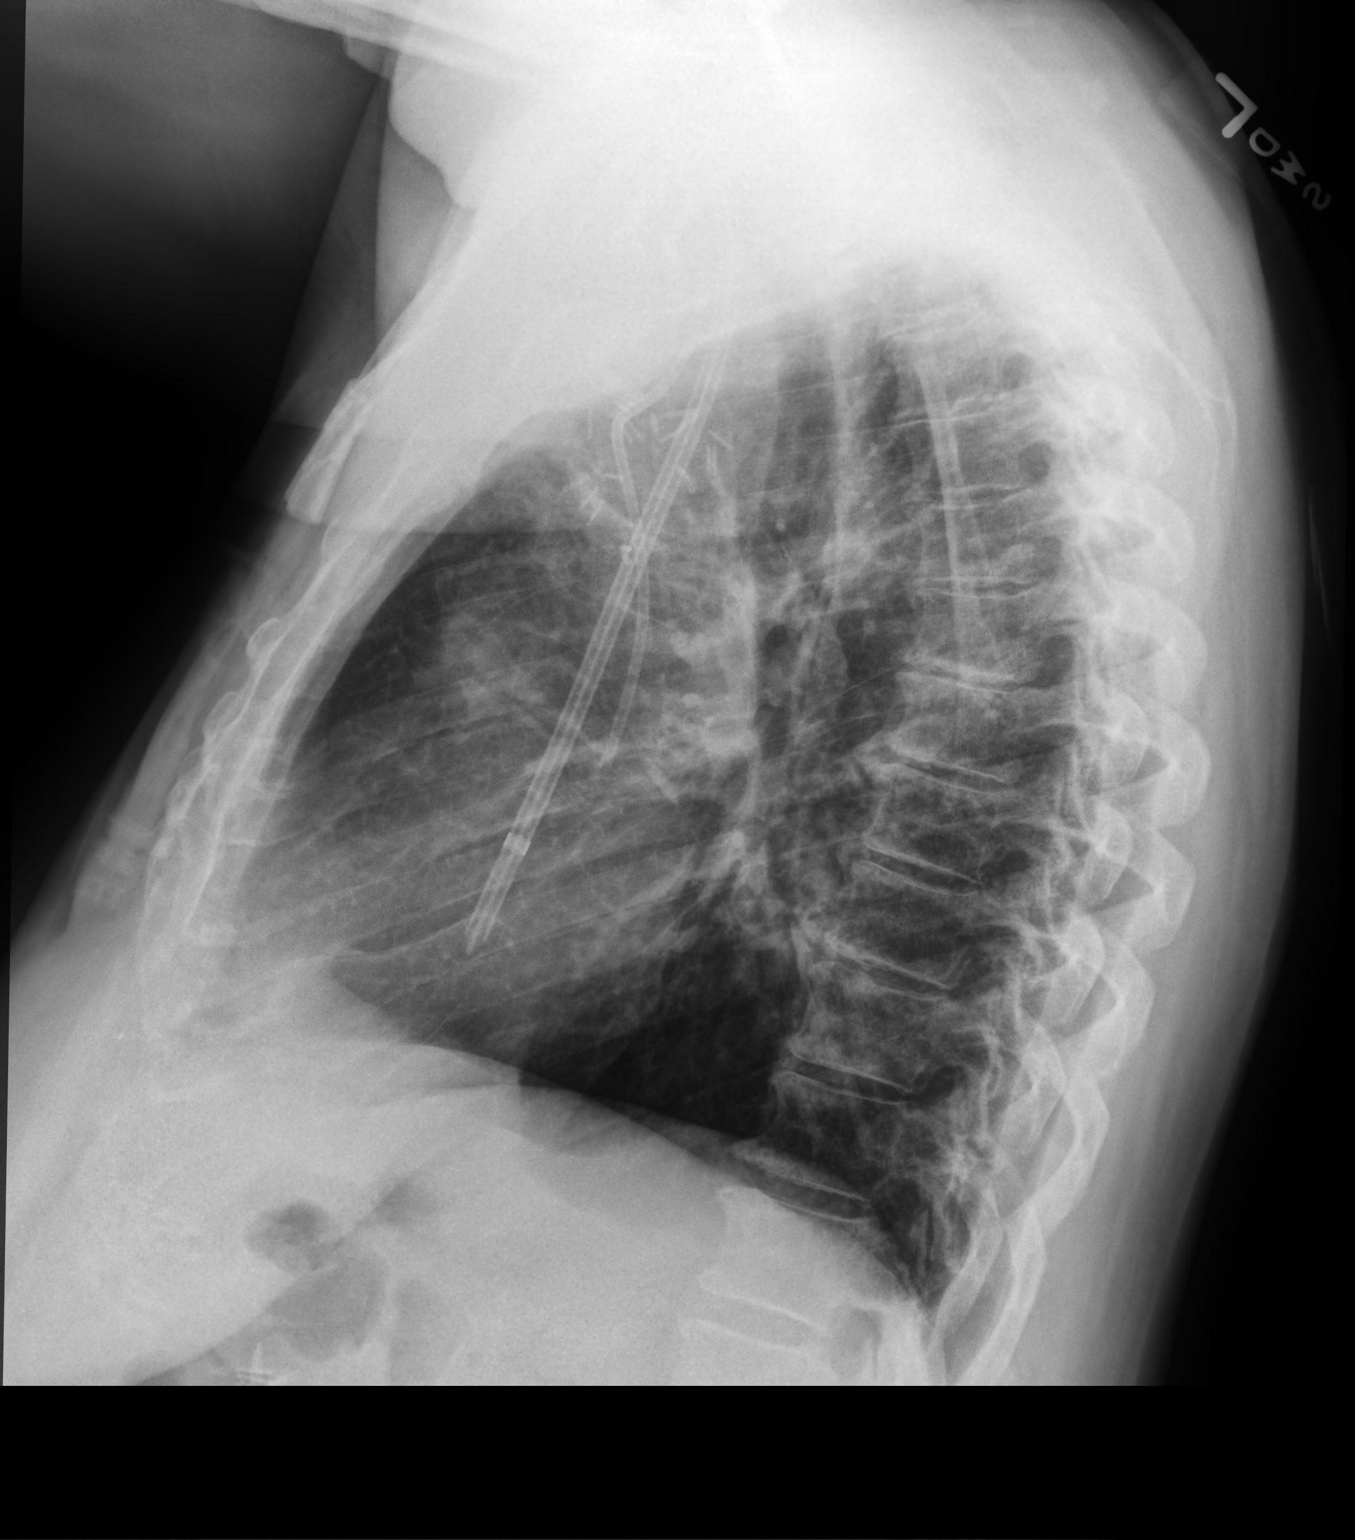

[2 of 2 positions shown; findings below may reference images not displayed]

FINDINGS: New right chest tunneled type dialysis catheter. Stable left chest
power port. Lung volumes and mediastinal contours are normal.
However, asymmetric patchy peribronchial opacity persists since
[REDACTED]. The left lung appears clear. No pneumothorax, pulmonary
edema, pleural effusion.

Prior ACDF. No acute osseous abnormality identified. Stable
cholecystectomy clips. Negative visible bowel gas pattern. Chronic
right axillary node dissection.
IMPRESSION: 1. Right lung peribronchial opacity, some of which seems present
since [REDACTED]. This is nonspecific but suspicious for bronchopneumonia
in the setting of cough.
If unresolved following trial of antibiotic therapy then recommend
follow-up Chest CT, which can be compared to 07/02/2018.

2. No other acute cardiopulmonary abnormality.

## 2020-07-19 ENCOUNTER — Other Ambulatory Visit: Payer: Self-pay | Admitting: Hematology and Oncology

## 2020-08-26 NOTE — Progress Notes (Signed)
Patient Care Team: [name], MD as PCP - General (Family Medicine) [name], MD as Consulting Physician (Hematology and Oncology) [name], MD as Consulting Physician (Radiation Oncology) [name], MD as Consulting Physician (General Surgery) [facility]  DIAGNOSIS:    ICD-10-CM   1. Malignant neoplasm of upper-outer quadrant of right breast in female, estrogen receptor positive ([place])  C50.411    Z17.0     SUMMARY OF ONCOLOGIC HISTORY: Oncology History  Malignant neoplasm of upper-outer quadrant of right breast in female, estrogen receptor positive ([place])  11/12/2017 Initial Diagnosis   Palpable lesion in the right breast upper outer quadrant mammogram and ultrasound revealed 1.4 cm tumor ultrasound-guided biopsy revealed grade 2 IDC ER 3+, PR 3+, HER-2 negative with a Ki-67 of 10%.  Right axillary lymph node biopsy positive, T1cN1 stage I a clinical stage   12/23/2017 Cancer Staging   Staging form: Breast, AJCC 8th Edition - Clinical: Stage IB (cT1c, cN1, cM0, G2, ER+, PR+, HER2-) - Signed by Gardenia Phlegm, NP on 12/23/2017   01/10/2018 Breast MRI   Biopsy-proven malignancy UOQ right breast measuring 6 cm.  Anterior inferior to this is a 1 cm mass which needs biopsy.  Focal non-mass enhancement LOQ retroareolar right breast, left breast 7 mm irregular enhancement, 2 suspicious right axillary lymph nodes   01/27/2018 Procedure   Right breast biopsy UOQ: Grade 1 ILC, ALH Right breast biopsy 6:00: LCIS Left breast biopsy: Benign   06/21/2018 Surgery   Rt Mastectomy: 7 cm fibrotic mass ILC Grade 2, 5/6 LN Positive, ER 90-100%, PR 5%-70%, Ki 67 2-10%, Her 2 Neg, T3N2   07/13/2018 Surgery   Right axillary lymph node dissection: 3/10 lymph nodes positive making total number of lymph nodes 8/16   08/10/2018 - 12/28/2018 Chemotherapy   Adjuvant chemotherapy with dose dense Adriamycin and Cytoxan x4 followed by Taxol weekly x12    01/24/2019 -  Radiation Therapy   Adjuvant radiation treatment   03/2019 -  Anti-estrogen oral therapy   Letrozole daily     CHIEF COMPLIANT: Follow-up of right breast cancer on letrozole   INTERVAL HISTORY: Charlene Perry is a 70 y.o. with above-mentioned history of right breast cancer who underwent a right mastectomy, adjuvant chemotherapy, radiation, and is currently on antiestrogen therapy with letrozole.Mammogram on 08/26/19 showed no evidence of malignancy bilaterally. She presents to the clinic today for follow-up.  She is tolerating anastrozole extremely well without any problems or concerns.  She is going to set up her mammogram appointment. She was diagnosed with end-stage kidney disease and is currently on peritoneal dialysis.  ALLERGIES:  has No Known Allergies.  MEDICATIONS:  Current Outpatient Medications  Medication Sig Dispense Refill   aspirin 81 MG chewable tablet Chew by mouth.     carvedilol (COREG) 12.5 MG tablet Take 1 tablet (12.5 mg total) by mouth 2 (two) times daily with a meal. (Patient taking differently: Take 12.5 mg by mouth daily as needed (If bp is above 120). Take 12.5 mg twice a day if Bp is over 120) 60 tablet 0   cetirizine (ZYRTEC) 10 MG tablet Take 10 mg by mouth daily.     Docusate Sodium (DSS) 100 MG CAPS Take by mouth.     escitalopram (LEXAPRO) 5 MG tablet Take by mouth.     furosemide (LASIX) 80 MG tablet Take 80 mg by mouth daily as needed for fluid. On Dialysis days     gentamicin cream (GARAMYCIN) 0.1 % Apply  heparin 1000 unit/mL SOLN injection Inject into the peritoneum.    °• iron sucrose in sodium chloride 0.9 % 100 mL Inject 100 mg into the vein every 30 (thirty) days.     °• letrozole (FEMARA) 2.5 MG tablet Take 1 tablet by mouth once daily 90 tablet 0  °• levothyroxine (SYNTHROID, LEVOTHROID) 125 MCG tablet Take 125 mcg by mouth daily before breakfast.    °• Methoxy PEG-Epoetin Beta (MIRCERA IJ) Mircera    °•  Prednisol Ace-Moxiflox-Bromfen 1-0.5-0.075 % SUSP instill 1 drop 3x a day to the surgical eye, start 1 day prior to surgery    °• rosuvastatin (CRESTOR) 40 MG tablet Take 40 mg by mouth at bedtime.     ° °No current facility-administered medications for this visit.  ° ° °PHYSICAL EXAMINATION: °ECOG PERFORMANCE STATUS: 1 - Symptomatic but completely ambulatory ° °Vitals:  ° 08/27/20 1418  °BP: (!) 167/71  °Pulse: 83  °Resp: 18  °Temp: (!) 97.4 °F (36.3 °C)  °SpO2: 99%  ° °Filed Weights  ° 08/27/20 1418  °Weight: 195 lb 9.6 oz (88.7 kg)  ° ° °BREAST: No palpable masses or nodules in either right or left breasts. No palpable axillary supraclavicular or infraclavicular adenopathy no breast tenderness or nipple discharge. (exam performed in the presence of a chaperone) ° °LABORATORY DATA:  °I have reviewed the data as listed °CMP Latest Ref Rng & Units 02/21/2020 09/09/2019 09/08/2019  °Glucose 70 - 99 mg/dL 134(H) 93 111(H)  °BUN 8 - 23 mg/dL 28(H) 14 25(H)  °Creatinine 0.44 - 1.00 mg/dL 4.90(H) 5.07(H) 7.17(H)  °Sodium 135 - 145 mmol/L 137 139 139  °Potassium 3.5 - 5.1 mmol/L 4.1 4.0 4.1  °Chloride 98 - 111 mmol/L 101 101 104  °CO2 22 - 32 mmol/L - 27 24  °Calcium 8.9 - 10.3 mg/dL - 8.2(L) 8.2(L)  °Total Protein 6.5 - 8.1 g/dL - - -  °Total Bilirubin 0.3 - 1.2 mg/dL - - -  °Alkaline Phos 38 - 126 U/L - - -  °AST 15 - 41 U/L - - -  °ALT 0 - 44 U/L - - -  ° ° °Lab Results  °Component Value Date  ° WBC 4.9 09/08/2019  ° HGB 12.2 02/21/2020  ° HCT 36.0 02/21/2020  ° MCV 90.4 09/08/2019  ° PLT 237 09/08/2019  ° NEUTROABS 7.0 09/03/2019  ° ° °ASSESSMENT & PLAN:  °Malignant neoplasm of upper-outer quadrant of right breast in female, estrogen receptor positive ([facility]) °06/21/17: Rt Mastectomy: 7 cm fibrotic mass ILC Grade 2, 5/6 LN Positive, ER 90-100%, PR 5%-70%, Ki 67 2-10%, Her 2 Neg, T3N2 °Treatment plan: °1. Adj chemo with dose dense Adriamycin and Cytoxan followed by Taxol x12 (08/10/2018-12/28/2018) °2. Adj RT started  01/24/2019-03/07/2019 °3. Adj Anti estrogen therapy °  °CT CAP: 07/02/2018: Subtle hypoattenuating lesion upper pole left kidney could be a cyst, urology consulted, left adrenal adenoma. °--------------------------------------------------------------------------------------------------------------------- °Current treatment: Adjuvant antiestrogen therapy with anastrozole 1 mg daily to start 03/17/2019 °Anastrozole toxicities °She is tolerating anastrozole without any problems or concerns.  Denies any hot flashes. °Mild joint aches and pains. ° °End-stage kidney disease: On peritoneal dialysis.  She is very happy with peritoneal dialysis since she now has the ability to travel and do things through the day.  She has a fistula on the left arm. ° °Breast cancer surveillance: °1.  Left breast mammogram 08/26/2019: Benign breast density category C °2. breast exam 08/27/2020: Benign ° °Return to clinic in 1 year for follow-up ° ° ° °No   orders of the defined types were placed in this encounter. ° °The patient has a good understanding of the overall plan. she agrees with it. she will call with any problems that may develop before the next visit here. ° °Total time spent: 20 mins including face to face time and time spent for planning, charting and coordination of care ° °Vinay K Gudena, MD, MPH °08/27/2020 ° °I, Molly Dorshimer, am acting as scribe for Dr. Vinay Gudena. ° °I have reviewed the above documentation for accuracy and completeness, and I agree with the above. ° ° ° ° ° ° °

## 2020-08-27 ENCOUNTER — Other Ambulatory Visit: Payer: Self-pay

## 2020-08-27 ENCOUNTER — Telehealth: Payer: Self-pay | Admitting: Hematology and Oncology

## 2020-08-27 ENCOUNTER — Inpatient Hospital Stay: Payer: BC Managed Care – PPO | Attending: Hematology and Oncology | Admitting: Hematology and Oncology

## 2020-08-27 DIAGNOSIS — N186 End stage renal disease: Secondary | ICD-10-CM | POA: Insufficient documentation

## 2020-08-27 DIAGNOSIS — Z170 Estrogen receptor positive status [ER+]: Secondary | ICD-10-CM | POA: Diagnosis not present

## 2020-08-27 DIAGNOSIS — Z992 Dependence on renal dialysis: Secondary | ICD-10-CM | POA: Insufficient documentation

## 2020-08-27 DIAGNOSIS — Z9011 Acquired absence of right breast and nipple: Secondary | ICD-10-CM | POA: Diagnosis not present

## 2020-08-27 DIAGNOSIS — C50411 Malignant neoplasm of upper-outer quadrant of right female breast: Secondary | ICD-10-CM | POA: Insufficient documentation

## 2020-08-27 DIAGNOSIS — Z79811 Long term (current) use of aromatase inhibitors: Secondary | ICD-10-CM | POA: Diagnosis not present

## 2020-08-27 DIAGNOSIS — Z79899 Other long term (current) drug therapy: Secondary | ICD-10-CM | POA: Diagnosis not present

## 2020-08-27 MED ORDER — POTASSIUM CHLORIDE ER 10 MEQ PO TBCR: 10.0000 meq | EXTENDED_RELEASE_TABLET | Freq: Every day | ORAL | Status: DC

## 2020-08-27 MED ORDER — LETROZOLE 2.5 MG PO TABS
2.5000 mg | ORAL_TABLET | Freq: Every day | ORAL | 3 refills | Status: DC
Start: 2020-08-27 — End: 2020-11-26

## 2020-08-27 NOTE — Assessment & Plan Note (Signed)
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 Adj chemo with dose dense Adriamycin and Cytoxan followed by Taxol x12 (08/10/2018-12/28/2018) 2. Adj RT started 01/24/2019-03/07/2019 3. Adj Anti estrogen therapy  CT CAP: 07/02/2018: Subtle hypoattenuating lesion upper pole left kidney could be a cyst, urology consulted, left adrenal adenoma. --------------------------------------------------------------------------------------------------------------------- Current treatment: Adjuvant antiestrogen therapy with anastrozole 1 mg daily to start 03/17/2019 Anastrozole toxicities   Breast cancer surveillance: 1.  Left breast mammogram 08/26/2019: Benign breast density category C 2. breast exam 08/27/2020: Benign  Return to clinic in 1 year for follow-up

## 2020-08-27 NOTE — Telephone Encounter (Signed)
Scheduled appointments per 3/14 los. Spoke to patient who is aware of appointment date and time.

## 2020-09-20 ENCOUNTER — Other Ambulatory Visit: Payer: Self-pay | Admitting: Family Medicine

## 2020-09-20 DIAGNOSIS — Z1231 Encounter for screening mammogram for malignant neoplasm of breast: Secondary | ICD-10-CM

## 2020-09-21 ENCOUNTER — Other Ambulatory Visit: Payer: Self-pay

## 2020-09-21 ENCOUNTER — Ambulatory Visit
Admission: RE | Admit: 2020-09-21 | Discharge: 2020-09-21 | Disposition: A | Discharge status: Home / Self Care | Payer: BC Managed Care – PPO | Source: Ambulatory Visit | Attending: Family Medicine | Admitting: Family Medicine

## 2020-09-21 DIAGNOSIS — Z1231 Encounter for screening mammogram for malignant neoplasm of breast: Secondary | ICD-10-CM

## 2020-11-07 ENCOUNTER — Encounter: Payer: Self-pay | Admitting: Hematology and Oncology

## 2020-11-25 ENCOUNTER — Other Ambulatory Visit: Payer: Self-pay | Admitting: Hematology and Oncology

## 2020-11-26 ENCOUNTER — Encounter: Payer: Self-pay | Admitting: Hematology and Oncology

## 2020-12-03 ENCOUNTER — Encounter: Payer: Self-pay | Admitting: Hematology and Oncology

## 2020-12-19 ENCOUNTER — Other Ambulatory Visit: Payer: Self-pay | Admitting: Hematology and Oncology

## 2021-04-04 ENCOUNTER — Other Ambulatory Visit: Payer: Self-pay

## 2021-04-04 ENCOUNTER — Ambulatory Visit (INDEPENDENT_AMBULATORY_CARE_PROVIDER_SITE_OTHER): Payer: BC Managed Care – PPO | Admitting: Pulmonary Disease

## 2021-04-04 ENCOUNTER — Encounter: Payer: Self-pay | Admitting: Pulmonary Disease

## 2021-04-04 DIAGNOSIS — G4733 Obstructive sleep apnea (adult) (pediatric): Secondary | ICD-10-CM

## 2021-04-04 NOTE — Progress Notes (Signed)
   Subjective:    Patient ID: Charlene Perry, female    DOB: [birth year], 70 y.o.   MRN: [id number]  HPI  70 yo for FU of OSA  OSA diagnosed in 2002 s/p UPPP, was on CPPA 11 cm, stopped in 2018  PMH - ESRD on PD Breast CA 2019  Chief Complaint  Patient presents with   Follow-up    No changes since last OV. CPAP machine in January picked up at [facility] [place]    Follow-up after 1.5 years.  Since her last visit she was set up with CPAP in January 2022 by her DME in [place].  She does not remember the name.  No download is available today.  She has settled down well with nasal pillows and chinstrap, sleeps uninterrupted through the night, nocturia. Wakes up feeling rested No dryness.  She has started on PD, weight has increased from 196 to 209 pounds.  She underwent transplant evaluation was turned down due to history of breast cancer   Significant tests/ events reviewed  HST 11/2019 >> mild OSA with AHI 13/ hr HST 11/2018 AHI 0.6/h   Review of Systems neg for any significant sore throat, dysphagia, itching, sneezing, nasal congestion or excess/ purulent secretions, fever, chills, sweats, unintended wt loss, pleuritic or exertional cp, hempoptysis, orthopnea pnd or change in chronic leg swelling. Also denies presyncope, palpitations, heartburn, abdominal pain, nausea, vomiting, diarrhea or change in bowel or urinary habits, dysuria,hematuria, rash, arthralgias, visual complaints, headache, numbness weakness or ataxia.     Objective:   Physical Exam   Gen. Pleasant, obese, in no distress ENT - no lesions, no post nasal drip Neck: No JVD, no thyromegaly, no carotid bruits Lungs: no use of accessory muscles, no dullness to percussion, decreased without rales or rhonchi  Cardiovascular: Rhythm regular, heart sounds  normal, no murmurs or gallops, no peripheral edema Musculoskeletal: No deformities, no cyanosis or clubbing , no tremors        Assessment & Plan:

## 2021-04-04 NOTE — Patient Instructions (Signed)
CPAP is working well CPAP supplies will be renewed x 1 year

## 2021-04-04 NOTE — Assessment & Plan Note (Signed)
CPAP seems to be working well for the history and she seems to have improved symptoms.  We discussed mild degree of OSA and that cardiovascular implications are minimal.  She seems to have settled down well with nasal pillows and chinstrap.  We will renew her supplies. We will obtain download from DME to objectively confirm compliance and pressure, change to fixed pressure if needed from auto settings.  Weight loss encouraged, compliance with goal of at least 4-6 hrs every night is the expectation. Advised against medications with sedative side effects Cautioned against driving when sleepy - understanding that sleepiness will vary on a day to day basis

## 2021-05-07 ENCOUNTER — Telehealth: Payer: Self-pay

## 2021-05-07 DIAGNOSIS — C50411 Malignant neoplasm of upper-outer quadrant of right female breast: Secondary | ICD-10-CM

## 2021-05-07 NOTE — Telephone Encounter (Signed)
Returned call to pt regarding lymphedema of right arm and hand.  Pt denies redness or warmth in the area but does state she has possibly been lifting some heavier items.  Pt thinks she could benefit from additional PT.  Reviewed with MD.  VO received for pt to follow up with cancer rehab for eval and tx of right arm/hand lymphedema.  Referral placed and pt verbalized understanding.

## 2021-05-30 ENCOUNTER — Ambulatory Visit: Payer: BC Managed Care – PPO | Attending: Hematology and Oncology | Admitting: Physical Therapy

## 2021-05-30 DIAGNOSIS — C50411 Malignant neoplasm of upper-outer quadrant of right female breast: Secondary | ICD-10-CM | POA: Insufficient documentation

## 2021-05-30 DIAGNOSIS — I972 Postmastectomy lymphedema syndrome: Secondary | ICD-10-CM | POA: Diagnosis present

## 2021-05-30 DIAGNOSIS — M6281 Muscle weakness (generalized): Secondary | ICD-10-CM

## 2021-05-30 DIAGNOSIS — Z483 Aftercare following surgery for neoplasm: Secondary | ICD-10-CM

## 2021-05-30 DIAGNOSIS — Z170 Estrogen receptor positive status [ER+]: Secondary | ICD-10-CM | POA: Diagnosis not present

## 2021-05-30 DIAGNOSIS — M25611 Stiffness of right shoulder, not elsewhere classified: Secondary | ICD-10-CM | POA: Diagnosis present

## 2021-05-30 NOTE — Therapy (Signed)
[facility] @ [facility] [address] [place], [place], [address] Phone: [phone]   Fax:  [phone]  Physical Therapy Evaluation  Patient Details  Name: Charlene Perry MRN: [id number] Date of Birth: Aug 04, 1950 Referring Provider (PT): Dr Lindi Adie   Encounter Date: 05/30/2021   PT End of Session - 05/30/21 1440     Visit Number 1    Number of Visits 24    Date for PT Re-Evaluation 08/13/21   treatment to start in January   PT Start Time 0900    PT Stop Time 0955    PT Time Calculation (min) 55 min    Activity Tolerance Patient tolerated treatment well    Behavior During Therapy Charlene Perry for tasks assessed/performed             Past Medical History:  Diagnosis Date   Arthritis    Breast cancer in female [facility])    Right   Cancer ([place])    Diabetes mellitus without complication ([place])    not on medication   Family history of breast cancer    Family history of colon cancer    Hypertension    takes fluid pills   Hypothyroidism    Pink eye disease of left eye    went to eye md and received drops - yesterday.   Renal disorder    Sleep apnea    back in the 1990's   Vitiligo     Past Surgical History:  Procedure Laterality Date   AV FISTULA PLACEMENT Left 02/21/2020   Procedure: CREATION of LEFT ARM BRACHIOCEPHALIC FISTULA;  Surgeon: Angelia Mould, MD;  Location: [place];  Service: Vascular;  Laterality: Left;   AXILLARY LYMPH NODE DISSECTION Right 07/13/2018   Procedure: RIGHT AXILLARY LYMPH NODE DISSECTION;  Surgeon: [name], MD;  Location: [place];  Service: General;  Laterality: Right;   BREAST BIOPSY Bilateral 01/2019   BREAST SURGERY     left breast fibro adenoma   CATARACT EXTRACTION Right 2019   CERVICAL FUSION N/A    C5 &6   CESAREAN SECTION     CHOLECYSTECTOMY  2004-2005   CYSTECTOMY     patient denies   EYE SURGERY     IR FLUORO GUIDE CV LINE RIGHT  09/07/2019   IR IMAGING GUIDED PORT  INSERTION  07/29/2018   IR US GUIDE VASC ACCESS RIGHT  09/07/2019   MASTECTOMY Right 06/21/2018   MASTECTOMY WITH RADIOACTIVE SEED GUIDED EXCISION AND AXILLARY SENTINEL LYMPH NODE BIOPSY Right 06/21/2018   MASTECTOMY WITH RADIOACTIVE SEED GUIDED EXCISION AND AXILLARY SENTINEL LYMPH NODE BIOPSY Right 06/21/2018   Procedure: RIGHT TOTAL MASTECTOMY WITH RIGHT AXILLARY SENTINEL NODE BIOPSY AND RIGHT SEED GUIDED NODE EXCISION;  Surgeon: [name], MD;  Location: [place];  Service: General;  Laterality: Right;   [place] N/A 07/13/2018   Procedure: INSERTION PORT-A-CATH WITH ULTRASOUND;  Surgeon: [name], MD;  Location: [place];  Service: General;  Laterality: N/A;   TONSILLECTOMY  2002   UVULOPALATOPHARYNGOPLASTY, TONSILLECTOMY AND SEPTOPLASTY      There were no vitals filed for this visit.    Subjective Assessment - 05/30/21 0901     Subjective The swelling in her arm is not subsiding or going down, so she wants to find out what else she can do    Pertinent History right breast cancer in 2019  with  mastectomy in 2020  with lymph node removal and had to have a second surgery for more lymph node removal with chemo and radiation. Did fine til end of October 2022 with only  intermittent swelling but now pretty much stays all the time.  She has a sleeve and glove that was refitted as she has lost weight.  and was mostly wearing the glove  but not all the time.  Past history includes end stage renal disease that was discovered in March of 2021 with end stage kidney disease She is on periotoneal disease 5 nights a week.she has a port on the right side of abdomen that she ( Dr.Joseph  Coladonato at [employer]) She has a fistula in left arm. She lives 45 minutes away from here. Past history includes widespread vitiligo    Patient Stated Goals to decrease the swelling in her right arm    Currently in Pain?  No/denies                [facility] PT Assessment - 05/30/21 0001       Assessment   Medical Diagnosis right breast cancer    Referring Provider (PT) Dr Lindi Adie    Onset Date/Surgical Date 06/21/18    Hand Dominance Right;Left      Precautions   Precautions --   on peritoneal dialysis, past radiation     Restrictions   Weight Bearing Restrictions No   pt states she feels her legs are a little weak     Balance Screen   Has the patient fallen in the past 6 months No    Has the patient had a decrease in activity level because of a fear of falling?  Yes   pt feels that her legs are weaker   Is the patient reluctant to leave their home because of a fear of falling?  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 Well healed incision on right chest with moveable skin Pt with abdominal port for peritoneal dialysis that was not examined      Sensation   Light Touch Not tested      Coordination   Gross Motor Movements are Fluid and Coordinated No   pt with slowed movments and difficulty raising right arm over her head     Posture/Postural Control   Posture/Postural Control Postural limitations    Postural Limitations Rounded Shoulders;Forward head      ROM / Strength   AROM / PROM / Strength AROM      AROM   Overall AROM  Deficits    Right/Left Shoulder Right;Left    Right Shoulder Flexion 145  Degrees    Right Shoulder ABduction 98 Degrees    Right Shoulder External Rotation 90 Degrees    Left Shoulder Flexion 160 Degrees    Left Shoulder ABduction 160  Degrees    Left Shoulder External Rotation 90 Degrees      Palpation   Palpation comment pitting edema in right hand and forearm               LYMPHEDEMA/ONCOLOGY QUESTIONNAIRE - 05/30/21 0001       Type   Cancer Type right breast cancer      Surgeries   Mastectomy Date 06/21/18    Sentinel Lymph Node Biopsy Date 06/21/18    Axillary Lymph Node Dissection Date 07/13/18    Number Lymph Nodes Removed 17   7 positive     Treatment   Past Chemotherapy Treatment Yes    Past Radiation Treatment Yes      What other symptoms do you have   Are you Having Heaviness or Tightness Yes    Are you having Pain No    Are you having pitting edema Yes    Body Site right hand and forearm    Is it Hard or Difficult finding clothes that fit Yes    Do you have infections No    Is there Decreased scar mobility No    [place]emmer Sign Yes      Lymphedema Assessments   Lymphedema Assessments Upper extremities      Right Upper Extremity Lymphedema   10 cm Proximal to Olecranon Process 37 cm    Olecranon Process 29 cm    15 cm Proximal to Ulnar Styloid Process 31 cm    10 cm Proximal to Ulnar Styloid Process 27 cm    Just Proximal to Ulnar Styloid Process 18 cm    Across Hand at [employer] 20.5 cm    At [place] of 2nd Digit 7.3 cm      Left Upper Extremity Lymphedema   10 cm Proximal to Olecranon Process 35.5 cm    Olecranon Process 27 cm    15 cm Proximal to Ulnar Styloid Process 27 cm    10 cm Proximal to Ulnar Styloid Process 23.5 cm    Just Proximal to Ulnar Styloid Process 16 cm    Across Hand at [employer] 19 cm    At [place] of 2nd Digit 6.8 cm                     Objective measurements completed on examination: See above findings.       [facility] Adult PT Treatment/Exercise - 05/30/21 0001        Self-Care   Self-Care Other Self-Care Comments    Other Self-Care Comments  educated briefly on lymphatic system and components of complete decongestive therapy. message sent to Dr. Marval Regal for kidney clearance prior to treatement                       PT Short Term Goals - 05/30/21 1450       PT SHORT TERM GOAL #1   Title Pt and husband will be able to perfom manual lymph drainage    Time 6    Period Weeks    Status New      PT SHORT TERM GOAL #2   Title Pt will verbalize importance of elevation and exercise for managment of lymphedema    Time 6    Period Weeks    Status New      PT SHORT TERM GOAL #3   Title Pt and husband will be able to apply compression bandaging to help with managment of lymphedema  Time 6    Period Weeks    Status New               PT Long Term Goals - 05/30/21 1448       PT LONG TERM GOAL #1   Title Pt and husband will report they can  manage her lymphedema at home    Time 10    Period Weeks    Status New      PT LONG TERM GOAL #2   Title Pt will know how to get the compression garments that she needs for management at home    Time 10    Period Weeks    Status New      PT LONG TERM GOAL #3   Title Pt will have 125 degrees of painfree right shoulder abduction so that she can perform her ADls easier    Time 10    Period Weeks    Status New                    Plan - 05/30/21 1441     Clinical Impression Statement Pt has had several healht challenges since 2019 and is here today for information about treatment of progressive lymphedema in her right hand and forearm. She also has limited ROM and weakness in right shoulder after mastectomy. She will benefit from PT for complete decongestive therapy to teach her to manage her lymphedema and improve shoulder ROM    Personal Factors and Comorbidities Comorbidity 3+    Comorbidities previous chemo and radiation, end stage renal disease on peritoneal dialysis ,  previous mastectomy    Examination-Activity Limitations Reach Overhead    Examination-Participation Restrictions Cleaning;Community Activity;Laundry    Stability/Clinical Decision Making Stable/Uncomplicated    Clinical Decision Making Low    PT Frequency 3x / week   decrease to one time a week   PT Duration 8 weeks    PT Treatment/Interventions ADLs/Self Care Home Management;Therapeutic exercise;Taping;Passive range of motion;Patient/family education;Orthotic Fit/Training;Manual lymph drainage;Compression bandaging;Manual techniques;Therapeutic activities    PT Next Visit Plan check for clearance from Dr. Marval Regal, begin complete decongestive therapy, teach self MLD and bandaging. Help with garments ( flat knit, night time) ROM and strength exericse, later, assess for mobiltiy and set more goal to increase fitness if needed    Consulted and Agree with Plan of Care Patient             Patient will benefit from skilled therapeutic intervention in order to improve the following deficits and impairments:  Decreased knowledge of use of DME, Decreased knowledge of precautions, Decreased range of motion, Increased edema, Postural dysfunction, Impaired UE functional use, Increased fascial restricitons, Decreased strength  Visit Diagnosis: Aftercare following surgery for neoplasm - Plan: PT plan of care cert/re-cert  Postmastectomy lymphedema - Plan: PT plan of care cert/re-cert  Stiffness of right shoulder, not elsewhere classified - Plan: PT plan of care cert/re-cert  Muscle weakness (generalized) - Plan: PT plan of care cert/re-cert     Problem List Patient Active Problem List   Diagnosis Date Noted   Microproteinuria 01/11/2020   Allergy, unspecified, initial encounter 01/04/2020   Anaphylactic shock, unspecified, initial encounter 01/04/2020   Anemia in chronic kidney disease 12/05/2019   Hypokalemia 12/05/2019   ESRD (end stage renal disease) on dialysis ([place]) 10/03/2019    Moderate protein-calorie malnutrition ([place]) 09/15/2019   Anemia, unspecified 09/09/2019   Coagulation defect, unspecified ([place]) 09/09/2019   Diarrhea, unspecified 09/09/2019   Dyspnea,  unspecified 09/09/2019   Essential (primary) hypertension 09/09/2019   Iron deficiency anemia, unspecified 09/09/2019   Obstructive sleep apnea 09/09/2019   Pain, unspecified 09/09/2019   Pruritus, unspecified 09/09/2019   Secondary hyperparathyroidism of renal origin ([place]) 09/09/2019   Other acute kidney failure ([place]) 09/06/2019   Diabetes mellitus type II, non insulin dependent ([place]) 09/03/2019   Acute pyelonephritis    Acute kidney injury ([place]) 09/02/2019   History of therapeutic radiation 04/06/2019   Back pain with radiation 09/13/2018   Port-A-Cath in place 08/24/2018   Breast cancer, right ([place]) 06/21/2018   Genetic testing 01/14/2018   Family history of colon cancer    Family history of breast cancer    Malignant neoplasm of upper-outer quadrant of right breast in female, estrogen receptor positive ([place]) 12/15/2017   Plantar fasciitis, right 09/30/2017   Hyperlipemia 09/26/2011   Hypothyroidism 08/25/2002   Vitiligo 01/26/2002   Donato Heinz. Owens Shark PT  Norwood Levo, PT 05/30/2021, 2:57 PM  [facility] @ [address] [place], [place], [address] Phone: [phone]   Fax:  [phone]  Name: Charlene Perry MRN: [id number] Date of Birth: 1951-03-27

## 2021-06-19 ENCOUNTER — Encounter: Payer: Self-pay | Admitting: Physical Therapy

## 2021-07-01 ENCOUNTER — Ambulatory Visit: Payer: BC Managed Care – PPO | Attending: Hematology and Oncology

## 2021-07-01 ENCOUNTER — Other Ambulatory Visit: Payer: Self-pay

## 2021-07-01 DIAGNOSIS — M6281 Muscle weakness (generalized): Secondary | ICD-10-CM | POA: Insufficient documentation

## 2021-07-01 DIAGNOSIS — I972 Postmastectomy lymphedema syndrome: Secondary | ICD-10-CM | POA: Insufficient documentation

## 2021-07-01 DIAGNOSIS — M25611 Stiffness of right shoulder, not elsewhere classified: Secondary | ICD-10-CM | POA: Insufficient documentation

## 2021-07-01 DIAGNOSIS — Z483 Aftercare following surgery for neoplasm: Secondary | ICD-10-CM | POA: Insufficient documentation

## 2021-07-01 NOTE — Therapy (Signed)
[facility] @ [address] [facility] [address] [place], [place], [address] Phone: [phone]   Fax:  [phone]  Physical Therapy Treatment  Patient Details  Name: Charlene Perry MRN: [id number] Date of Birth: 1951/03/21 Referring Provider (PT): Dr Lindi Adie   Encounter Date: 07/01/2021   PT End of Session - 07/01/21 1225     Visit Number 2    Number of Visits 24    Date for PT Re-Evaluation 08/13/21    PT Start Time 1005    PT Stop Time 1105    PT Time Calculation (min) 60 min    Activity Tolerance Patient tolerated treatment well    Behavior During Therapy [facility] for tasks assessed/performed             Past Medical History:  Diagnosis Date   Arthritis    Breast cancer in female [facility])    Right   Cancer ([place])    Diabetes mellitus without complication ([place])    not on medication   Family history of breast cancer    Family history of colon cancer    Hypertension    takes fluid pills   Hypothyroidism    Pink eye disease of left eye    went to eye md and received drops - yesterday.   Renal disorder    Sleep apnea    back in the 1990's   Vitiligo     Past Surgical History:  Procedure Laterality Date   AV FISTULA PLACEMENT Left 02/21/2020   Procedure: CREATION of LEFT ARM BRACHIOCEPHALIC FISTULA;  Surgeon: Angelia Mould, MD;  Location: [facility];  Service: Vascular;  Laterality: Left;   AXILLARY LYMPH NODE DISSECTION Right 07/13/2018   Procedure: RIGHT AXILLARY LYMPH NODE DISSECTION;  Surgeon: [name], MD;  Location: [facility];  Service: General;  Laterality: Right;   BREAST BIOPSY Bilateral 01/2019   BREAST SURGERY     left breast fibro adenoma   CATARACT EXTRACTION Right 2019   CERVICAL FUSION N/A    C5 &6   CESAREAN SECTION     CHOLECYSTECTOMY  2004-2005   CYSTECTOMY     patient denies   EYE SURGERY     IR FLUORO GUIDE CV LINE RIGHT  09/07/2019   IR IMAGING GUIDED PORT INSERTION  07/29/2018   IR US GUIDE  VASC ACCESS RIGHT  09/07/2019   MASTECTOMY Right 06/21/2018   MASTECTOMY WITH RADIOACTIVE SEED GUIDED EXCISION AND AXILLARY SENTINEL LYMPH NODE BIOPSY Right 06/21/2018   MASTECTOMY WITH RADIOACTIVE SEED GUIDED EXCISION AND AXILLARY SENTINEL LYMPH NODE BIOPSY Right 06/21/2018   Procedure: RIGHT TOTAL MASTECTOMY WITH RIGHT AXILLARY SENTINEL NODE BIOPSY AND RIGHT SEED GUIDED NODE EXCISION;  Surgeon: [name], MD;  Location: [facility];  Service: General;  Laterality: Right;   [place] N/A 07/13/2018   Procedure: INSERTION PORT-A-CATH WITH ULTRASOUND;  Surgeon: [name], MD;  Location: [facility];  Service: General;  Laterality: N/A;   TONSILLECTOMY  2002   UVULOPALATOPHARYNGOPLASTY, TONSILLECTOMY AND SEPTOPLASTY      There were no vitals filed for this visit.   Subjective Assessment - 07/01/21 1223     Subjective We are traveling to [facility] and I won't be able to return until the beginning of Feb.    Pertinent History right breast cancer in 2019  with mastectomy in 2020  with lymph node removal and had to have  a second surgery for more lymph node removal with chemo and radiation. Did fine til end of October 2022 with only  intermittent swelling but now pretty much stays all the time.  She has a sleeve and glove that was refitted as she has lost weight.  and was mostly wearing the glove  but not all the time.  Past history includes end stage renal disease that was discovered in March of 2021 with end stage kidney disease She is on periotoneal disease 5 nights a week.she has a port on the right side of abdomen that she ( Dr.Joseph  Coladonato at [employer]) She has a fistula in left arm. She lives 45 minutes away from here. Past history includes widespread vitiligo    Patient Stated Goals to decrease the swelling in her right arm    Currently in Pain? No/denies                                [facility] Adult PT Treatment/Exercise - 07/01/21 0001       Manual Therapy   Manual Therapy Compression Bandaging    Compression Bandaging Cocoa butter to Rt UE: Thin stockinette, Elastomull to fingers 1-4, Artiflex from hand to axilla doubling at antecubital fossa, and 1-6, 1-10 and 1-12 cm short stretch compression bandages from hand to axilla beginning to instruct husband and pt and having him return demo of each bandage.                     PT Education - 07/01/21 1237     Education Details Compression bandaging to Rt UE    Person(s) Educated Patient;Spouse    Methods Explanation;Demonstration;Tactile cues;Verbal cues;Handout    Comprehension Verbalized understanding;Returned demonstration;Verbal cues required;Need further instruction              PT Short Term Goals - 05/30/21 1450       PT SHORT TERM GOAL #1   Title Pt and husband will be able to perfom manual lymph drainage    Time 6    Period Weeks    Status New      PT SHORT TERM GOAL #2   Title Pt will verbalize importance of elevation and exercise for managment of lymphedema    Time 6    Period Weeks    Status New      PT SHORT TERM GOAL #3   Title Pt and husband will be able to apply compression bandaging to help with managment of lymphedema    Time 6    Period Weeks    Status New               PT Long Term Goals - 05/30/21 1448       PT LONG TERM GOAL #1   Title Pt and husband will report they can  manage her lymphedema at home    Time 10    Period Weeks    Status New      PT LONG TERM GOAL #2   Title Pt will know how to get the compression garments that she needs for management at home    Time 10    Period Weeks    Status New      PT LONG TERM GOAL #3   Title Pt will have 125 degrees of painfree right shoulder abduction so that she can perform her ADls easier    Time 10  Period Weeks    Status New                   Plan - 07/01/21 1226     Clinical Impression  Statement Pt comes in reporting that she will not be able to do 3x/wk now and will be out of town until beginning of Feb so requested to learn what she could today for her upcoming trip. Her husband was present so decided to instruct both in compression bandaging as Dr. Marval Regal gave pt clearance for CDT, and spent entire session doing such. Handout was issued as well. Had pts husband return demo of each step/bandage with therapist offering guidance throughout. Took caution to educate both pt and husband that pt should notice a slight increase, or at least maintenance, of urinary output in accordance iwth reduction of her limb while undergoing compression bandaging due to her abdominal port. And that she is to stop bandaging immediately if she notices her urinary output decreasing or notices abdominal swelling or pain around port area. They were both able to verbalize good understanding of all instructions issued today. They also know they can call this clinic with any questions while out of town or should update doctor if other aforementioned symptoms occur.    Personal Factors and Comorbidities Comorbidity 3+    Comorbidities previous chemo and radiation, end stage renal disease on peritoneal dialysis , previous mastectomy    Examination-Activity Limitations Reach Overhead    Examination-Participation Restrictions Cleaning;Community Activity;Laundry    Stability/Clinical Decision Making Stable/Uncomplicated    PT Frequency 3x / week   pt wanted to decr to 1x/wk after first few weeks   PT Duration 8 weeks    PT Treatment/Interventions ADLs/Self Care Home Management;Therapeutic exercise;Taping;Passive range of motion;Patient/family education;Orthotic Fit/Training;Manual lymph drainage;Compression bandaging;Manual techniques;Therapeutic activities    PT Next Visit Plan How was bandaging while out of town? Any urinary changes or problems due to abdominal port? If tolerating well cont with bandaging and  review with pt and husband prn, and begin manual lymph drainage also instructing pt and husband in this as well. Later, help with garments ( flat knit, night time) ROM and strength exericse, later, assess for mobiltiy and set more goal to increase fitness if needed    PT Home Exercise Plan Compression bandaging and pt to try to wear 22-23 hrs/day    Consulted and Agree with Plan of Care Patient             Patient will benefit from skilled therapeutic intervention in order to improve the following deficits and impairments:  Decreased knowledge of use of DME, Decreased knowledge of precautions, Decreased range of motion, Increased edema, Postural dysfunction, Impaired UE functional use, Increased fascial restricitons, Decreased strength  Visit Diagnosis: Aftercare following surgery for neoplasm  Postmastectomy lymphedema  Stiffness of right shoulder, not elsewhere classified  Muscle weakness (generalized)     Problem List Patient Active Problem List   Diagnosis Date Noted   Microproteinuria 01/11/2020   Allergy, unspecified, initial encounter 01/04/2020   Anaphylactic shock, unspecified, initial encounter 01/04/2020   Anemia in chronic kidney disease 12/05/2019   Hypokalemia 12/05/2019   ESRD (end stage renal disease) on dialysis ([place]) 10/03/2019   Moderate protein-calorie malnutrition ([place]) 09/15/2019   Anemia, unspecified 09/09/2019   Coagulation defect, unspecified ([place]) 09/09/2019   Diarrhea, unspecified 09/09/2019   Dyspnea, unspecified 09/09/2019   Essential (primary) hypertension 09/09/2019   Iron deficiency anemia, unspecified 09/09/2019   Obstructive sleep apnea 09/09/2019  Pain, unspecified 09/09/2019   Pruritus, unspecified 09/09/2019   Secondary hyperparathyroidism of renal origin ([place]) 09/09/2019   Other acute kidney failure ([place]) 09/06/2019   Diabetes mellitus type II, non insulin dependent ([place]) 09/03/2019   Acute pyelonephritis    Acute kidney injury  ([place]) 09/02/2019   History of therapeutic radiation 04/06/2019   Back pain with radiation 09/13/2018   Port-A-Cath in place 08/24/2018   Breast cancer, right ([place]) 06/21/2018   Genetic testing 01/14/2018   Family history of colon cancer    Family history of breast cancer    Malignant neoplasm of upper-outer quadrant of right breast in female, estrogen receptor positive ([place]) 12/15/2017   Plantar fasciitis, right 09/30/2017   Hyperlipemia 09/26/2011   Hypothyroidism 08/25/2002   Vitiligo 01/26/2002    Otelia Limes, PTA 07/01/2021, 12:38 PM  [facility] @ [address] [place], [place], [address] Phone: [phone]   Fax:  [phone]  Name: Charlene Perry MRN: [id number] Date of Birth: Apr 20, 1951

## 2021-07-08 ENCOUNTER — Other Ambulatory Visit: Payer: Self-pay

## 2021-07-08 DIAGNOSIS — N186 End stage renal disease: Secondary | ICD-10-CM

## 2021-07-15 ENCOUNTER — Encounter: Payer: Medicare Other | Admitting: Rehabilitation

## 2021-07-18 ENCOUNTER — Encounter: Payer: Self-pay | Admitting: Vascular Surgery

## 2021-07-18 ENCOUNTER — Inpatient Hospital Stay (HOSPITAL_COMMUNITY): Admission: RE | Admit: 2021-07-18 | Payer: Medicare Other | Source: Ambulatory Visit

## 2021-07-18 ENCOUNTER — Ambulatory Visit (INDEPENDENT_AMBULATORY_CARE_PROVIDER_SITE_OTHER): Payer: BC Managed Care – PPO | Admitting: Vascular Surgery

## 2021-07-18 ENCOUNTER — Other Ambulatory Visit: Payer: Self-pay

## 2021-07-18 VITALS — BP 157/83 | HR 81 | Temp 98.5°F | Resp 20 | Ht 66.0 in | Wt 221.0 lb

## 2021-07-18 DIAGNOSIS — Z992 Dependence on renal dialysis: Secondary | ICD-10-CM

## 2021-07-18 DIAGNOSIS — N186 End stage renal disease: Secondary | ICD-10-CM | POA: Diagnosis not present

## 2021-07-18 NOTE — Progress Notes (Signed)
REASON FOR VISIT:   Evaluate for possible steal syndrome left upper extremity.  MEDICAL ISSUES:   END-STAGE RENAL DISEASE: I think the symptoms that she is having in her left upper extremity are likely related to steal syndrome.  Her Doppler signals in the left hand augments significantly with compression of her fistula.  We have discussed the option of continued observation versus banding of the fistula.  I explained that I would only consider ligating the fistula if she had a nonhealing wound on her hand.  Given the overall clinical picture, I would not recommend a DRIL procedure.  I think banding would be a fairly simple option with fairly low risk.  I have explained that the risk is if we do not narrow the fistula enough she would continue to have symptoms.  If we narrowed the fistula too much the fistula could clot.  She would like to continue to follow her symptoms for now before rushing into surgery.".  I think this is perfectly reasonable.  She will call if her symptoms progress.  HPI:   Charlene Perry is a pleasant 71 y.o. female who had a left brachiocephalic fistula placed on 02/21/2020.  The fistula has never been used except that it is used sometimes for blood draws.  She has peritoneal dialysis and her peritoneal catheter is working well.  After the procedure she did have some mild steal symptoms but these remained stable.  Beginning around November she began having symptoms in her left hand again.  She describes some cramping in her left thumb index and middle finger and pain in her left hand at times.  It feels better sometimes when she hangs her arm down and rubs it.  She cannot identify any specific aggravating or alleviating factors except for this.  She is left-handed.  Past Medical History:  Diagnosis Date   Arthritis    Breast cancer in female [facility])    Right   Cancer ([place])    Diabetes mellitus without complication ([place])    not on medication   Family history of  breast cancer    Family history of colon cancer    Hypertension    takes fluid pills   Hypothyroidism    Pink eye disease of left eye    went to eye md and received drops - yesterday.   Renal disorder    Sleep apnea    back in the 1990's   Vitiligo     Family History  Problem Relation Age of Onset   Colon cancer Mother 34   Colon cancer Sister 68   Colon cancer Brother        ex early 36's   Breast cancer Other        84's    SOCIAL HISTORY: Social History   Tobacco Use   Smoking status: Former    Types: Cigarettes   Smokeless tobacco: Never   Tobacco comments:    Quit 35 years ago  Substance Use Topics   Alcohol use: Not Currently    No Known Allergies  Current Outpatient Medications  Medication Sig Dispense Refill   aspirin 81 MG chewable tablet Chew by mouth.     carvedilol (COREG) 25 MG tablet Take 25 mg by mouth 2 (two) times daily.     cetirizine (ZYRTEC) 10 MG tablet Take 10 mg by mouth daily.     Docusate Sodium (DSS) 100 MG CAPS Take by mouth.     gentamicin cream (GARAMYCIN) 0.1 %  Apply topically.     letrozole (FEMARA) 2.5 MG tablet Take 1 tablet by mouth once daily 90 tablet 3   levothyroxine (SYNTHROID, LEVOTHROID) 125 MCG tablet Take 125 mcg by mouth daily before breakfast.     potassium chloride (KLOR-CON) 10 MEQ tablet Take 1 tablet (10 mEq total) by mouth daily.     rosuvastatin (CRESTOR) 40 MG tablet Take 40 mg by mouth at bedtime.      No current facility-administered medications for this visit.    REVIEW OF SYSTEMS:  [X]  denotes positive finding, [ ]  denotes negative finding Cardiac  Comments:  Chest pain or chest pressure:    Shortness of breath upon exertion:    Short of breath when lying flat:    Irregular heart rhythm:        Vascular    Pain in calf, thigh, or hip brought on by ambulation:    Pain in feet at night that wakes you up from your sleep:     Blood clot in your veins:    Leg swelling:         Pulmonary    Oxygen at  home:    Productive cough:     Wheezing:         Neurologic    Sudden weakness in arms or legs:     Sudden numbness in arms or legs:     Sudden onset of difficulty speaking or slurred speech:    Temporary loss of vision in one eye:     Problems with dizziness:         Gastrointestinal    Blood in stool:     Vomited blood:         Genitourinary    Burning when urinating:     Blood in urine:        Psychiatric    Major depression:         Hematologic    Bleeding problems:    Problems with blood clotting too easily:        Skin    Rashes or ulcers:        Constitutional    Fever or chills:     PHYSICAL EXAM:   Vitals:   07/18/21 1605  BP: (!) 157/83  Pulse: 81  Resp: 20  Temp: 98.5 F (36.9 C)  SpO2: 96%  Weight: 221 lb (100.2 kg)  Height: 5\' 6"  (1.676 m)    GENERAL: The patient is a well-nourished female, in no acute distress. The vital signs are documented above. CARDIAC: There is a regular rate and rhythm.  VASCULAR: I do not detect carotid bruits. She has palpable radial pulses. She has a good thrill in her left upper arm fistula which is only slightly pulsatile. She has a brisk radial ulnar and palmar arch signal with the Doppler on the left.  The signals do augment with compression of her fistula. PULMONARY: There is good air exchange bilaterally without wheezing or rales.  MUSCULOSKELETAL: There are no major deformities or cyanosis. NEUROLOGIC: No focal weakness or paresthesias are detected. SKIN: There are no ulcers or rashes noted. PSYCHIATRIC: The patient has a normal affect.  DATA:    STEAL STUDY: She had a previous steal study on 05/21/2020 which did show that her digital waveforms on the left increased 76 mmHg with compression of her fistula consistent with steal syndrome.  [name] [facility] [phone]

## 2021-07-22 ENCOUNTER — Other Ambulatory Visit: Payer: Self-pay

## 2021-07-22 ENCOUNTER — Ambulatory Visit: Payer: BC Managed Care – PPO | Attending: Hematology and Oncology | Admitting: Rehabilitation

## 2021-07-22 ENCOUNTER — Encounter: Payer: Self-pay | Admitting: Rehabilitation

## 2021-07-22 DIAGNOSIS — Z483 Aftercare following surgery for neoplasm: Secondary | ICD-10-CM | POA: Diagnosis present

## 2021-07-22 DIAGNOSIS — M6281 Muscle weakness (generalized): Secondary | ICD-10-CM | POA: Insufficient documentation

## 2021-07-22 DIAGNOSIS — I972 Postmastectomy lymphedema syndrome: Secondary | ICD-10-CM | POA: Insufficient documentation

## 2021-07-22 DIAGNOSIS — M25611 Stiffness of right shoulder, not elsewhere classified: Secondary | ICD-10-CM | POA: Diagnosis present

## 2021-07-22 NOTE — Therapy (Signed)
[facility] @ [address] [place], [place], [address] Phone: [phone]   Fax:  [phone]  Physical Therapy Treatment  Patient Details  Name: Charlene Perry MRN: [id number] Date of Birth: 10/29/1950 Referring Provider (PT): Dr Lindi Adie   Encounter Date: 07/22/2021   PT End of Session - 07/22/21 1102     Visit Number 3    Number of Visits 24    Date for PT Re-Evaluation 08/13/21    PT Start Time 1107    PT Stop Time 1201    PT Time Calculation (min) 54 min    Activity Tolerance Patient tolerated treatment well    Behavior During Therapy [facility] for tasks assessed/performed             Past Medical History:  Diagnosis Date   Arthritis    Breast cancer in female [facility])    Right   Cancer ([place])    Diabetes mellitus without complication ([place])    not on medication   Family history of breast cancer    Family history of colon cancer    Hypertension    takes fluid pills   Hypothyroidism    Pink eye disease of left eye    went to eye md and received drops - yesterday.   Renal disorder    Sleep apnea    back in the 1990's   Vitiligo     Past Surgical History:  Procedure Laterality Date   AV FISTULA PLACEMENT Left 02/21/2020   Procedure: CREATION of LEFT ARM BRACHIOCEPHALIC FISTULA;  Surgeon: Angelia Mould, MD;  Location: [place];  Service: Vascular;  Laterality: Left;   AXILLARY LYMPH NODE DISSECTION Right 07/13/2018   Procedure: RIGHT AXILLARY LYMPH NODE DISSECTION;  Surgeon: [name], MD;  Location: [place];  Service: General;  Laterality: Right;   BREAST BIOPSY Bilateral 01/2019   BREAST SURGERY     left breast fibro adenoma   CATARACT EXTRACTION Right 2019   CERVICAL FUSION N/A    C5 &6   CESAREAN SECTION     CHOLECYSTECTOMY  2004-2005   CYSTECTOMY     patient denies   EYE SURGERY     IR FLUORO GUIDE CV LINE RIGHT  09/07/2019   IR IMAGING GUIDED PORT INSERTION  07/29/2018   IR US GUIDE  VASC ACCESS RIGHT  09/07/2019   MASTECTOMY Right 06/21/2018   MASTECTOMY WITH RADIOACTIVE SEED GUIDED EXCISION AND AXILLARY SENTINEL LYMPH NODE BIOPSY Right 06/21/2018   MASTECTOMY WITH RADIOACTIVE SEED GUIDED EXCISION AND AXILLARY SENTINEL LYMPH NODE BIOPSY Right 06/21/2018   Procedure: RIGHT TOTAL MASTECTOMY WITH RIGHT AXILLARY SENTINEL NODE BIOPSY AND RIGHT SEED GUIDED NODE EXCISION;  Surgeon: [name], MD;  Location: [place];  Service: General;  Laterality: Right;   [place] N/A 07/13/2018   Procedure: INSERTION PORT-A-CATH WITH ULTRASOUND;  Surgeon: [name], MD;  Location: [place];  Service: General;  Laterality: N/A;   TONSILLECTOMY  2002   UVULOPALATOPHARYNGOPLASTY, TONSILLECTOMY AND SEPTOPLASTY      There were no vitals filed for this visit.   Subjective Assessment - 07/22/21 1220     Subjective We bandaged x 2 but then I have realized that is really too hard to do managing my dialysis and toileting with the bandaging on    Pertinent History right breast cancer in 2019  with mastectomy in 2020  with lymph node removal and had to have a second surgery for more lymph node removal with chemo and radiation. Did fine til end of October 2022 with only  intermittent swelling but now pretty much stays all the time.  She has a sleeve and glove that was refitted as she has lost weight.  and was mostly wearing the glove  but not all the time.  Past history includes end stage renal disease that was discovered in March of 2021 with end stage kidney disease She is on periotoneal disease 5 nights a week.she has a port on the right side of abdomen that she ( Dr.Joseph  Coladonato at [employer]) She has a fistula in left arm. She lives 45 minutes away from here. Past history includes widespread vitiligo    Currently in Pain? No/denies                               [facility] Adult PT  Treatment/Exercise - 07/22/21 0001       Manual Therapy   Manual Therapy Edema management;Manual Lymphatic Drainage (MLD)    Edema Management showed pt velcro garments which pt will order.  Gave order from Dr. Lindi Adie and ordering info for [facility]    Manual Lymphatic Drainage (MLD) started education on self MLD with pt and spouse with demo, vcs,,tcs, and hand over hand cueing for the spouse.  Performed all steps in seated with the patient and then in seated showed spouse how to work in this position.  Spouse needed sig vcs to decrease squeezing and force of pressure but was mindful of this after cueing.  Then PT performed more work in the UE in supine including deeper forearm techniques around the elbow and education on lymphatic anatomy and reasons for each portion.    Compression Bandaging did not apply due to pt request                       PT Short Term Goals - 05/30/21 1450       PT SHORT TERM GOAL #1   Title Pt and husband will be able to perfom manual lymph drainage    Time 6    Period Weeks    Status New      PT SHORT TERM GOAL #2   Title Pt will verbalize importance of elevation and exercise for managment of lymphedema    Time 6    Period Weeks    Status New      PT SHORT TERM GOAL #3   Title Pt and husband will be able to apply compression bandaging to help with managment of lymphedema    Time 6    Period Weeks    Status New               PT Long Term Goals - 05/30/21 1448       PT LONG TERM GOAL #1   Title Pt and husband will report they can  manage her lymphedema at home    Time 10    Period Weeks    Status New      PT LONG TERM GOAL #2   Title Pt will know how to get the compression garments that she needs for management at home    Time 10    Period Weeks    Status New      PT LONG TERM GOAL #3  Title Pt will have 125 degrees of painfree right shoulder abduction so that she can perform her ADls easier    Time 10    Period  Weeks    Status New                   Plan - 07/22/21 1224     Clinical Impression Statement Pt was able to bandage with help from husband but reports it was too hard to continue to due disinfection needed for dialysis set up and toileting activities.  Pt would like to switch to velcro garment use.  Pt will get one of these and we will continue CDT in the clinic.    PT Frequency 3x / week    PT Duration 8 weeks    PT Treatment/Interventions ADLs/Self Care Home Management;Therapeutic exercise;Taping;Passive range of motion;Patient/family education;Orthotic Fit/Training;Manual lymph drainage;Compression bandaging;Manual techniques;Therapeutic activities    PT Next Visit Plan *print out instructions- forgot to give*  cont with bandaging/velcro and review with pt and husband prn, and manual lymph drainage also instructing pt and husband in this as well.    Consulted and Agree with Plan of Care Patient             Patient will benefit from skilled therapeutic intervention in order to improve the following deficits and impairments:     Visit Diagnosis: Postmastectomy lymphedema  Aftercare following surgery for neoplasm  Stiffness of right shoulder, not elsewhere classified  Muscle weakness (generalized)     Problem List Patient Active Problem List   Diagnosis Date Noted   Microproteinuria 01/11/2020   Allergy, unspecified, initial encounter 01/04/2020   Anaphylactic shock, unspecified, initial encounter 01/04/2020   Anemia in chronic kidney disease 12/05/2019   Hypokalemia 12/05/2019   ESRD (end stage renal disease) on dialysis ([place]) 10/03/2019   Moderate protein-calorie malnutrition ([place]) 09/15/2019   Anemia, unspecified 09/09/2019   Coagulation defect, unspecified ([place]) 09/09/2019   Diarrhea, unspecified 09/09/2019   Dyspnea, unspecified 09/09/2019   Essential (primary) hypertension 09/09/2019   Iron deficiency anemia, unspecified 09/09/2019   Obstructive sleep  apnea 09/09/2019   Pain, unspecified 09/09/2019   Pruritus, unspecified 09/09/2019   Secondary hyperparathyroidism of renal origin ([place]) 09/09/2019   Other acute kidney failure ([place]) 09/06/2019   Diabetes mellitus type II, non insulin dependent ([place]) 09/03/2019   Acute pyelonephritis    Acute kidney injury ([place]) 09/02/2019   History of therapeutic radiation 04/06/2019   Back pain with radiation 09/13/2018   Port-A-Cath in place 08/24/2018   Breast cancer, right ([place]) 06/21/2018   Genetic testing 01/14/2018   Family history of colon cancer    Family history of breast cancer    Malignant neoplasm of upper-outer quadrant of right breast in female, estrogen receptor positive ([place]) 12/15/2017   Plantar fasciitis, right 09/30/2017   Hyperlipemia 09/26/2011   Hypothyroidism 08/25/2002   Vitiligo 01/26/2002    Stark Bray, PT 07/22/2021, 12:26 PM  [facility] @ [address] [place] [place], [place], [address] Phone: [phone]   Fax:  [phone]  Name: Charlene Perry MRN: [id number] Date of Birth: 04-08-1951

## 2021-07-22 NOTE — Patient Instructions (Addendum)
Deep Effective Breath   1.) Standing, sitting, or laying down, place both hands on the belly. Take a deep breath IN, expanding the belly; then breath OUT, contracting the belly.     2.) On both sides make 5 circles in the armpit,   3.) then pump _5__ times from involved armpit across chest to uninvolved armpit, making a pathway.(Right to left)    4.) On Rt side, make 5 circles at groin at panty line, then pump _5__ times from armpit along side of trunk to outer hip,  (Avoid port or stop at belt)  making your other pathway.   Arm Posterior: Elbow to Shoulder - Sweep   5.) Pump _5__ times from back of elbow to top of shoulder.  (Don't squeeze)   6.) Then inner to outer upper arm _5_ times,   7.) then outer arm again _5_ times.  Copyright  VHI. All rights reserved.  ARM: [place] Wrist to Elbow - Sweep   8.) Pump or stationary circles _5__ times from wrist to elbow making sure to do both sides of the forearm.   Then retrace your steps to the outer arm, and the pathways _2-3_ times each. Do _1__ time per day.  Copyright  VHI. All rights reserved.  ARM: Dorsum of Hand to Shoulder - Sweep   Pump or stationary circles _5__ times on back of hand including knuckle spaces and individual fingers if needed working up towards the wrist, then retrace all your steps working back up the forearm, doing both sides; upper outer arm and back to your pathways _2-3_ times each. Then do 5 circles again at uninvolved armpit and involved groin where you started! Good job!! Do __1_ time per day.  Copyright  VHI. All rights reserved.

## 2021-07-29 ENCOUNTER — Encounter: Payer: Medicare Other | Admitting: Rehabilitation

## 2021-07-30 ENCOUNTER — Other Ambulatory Visit: Payer: Self-pay

## 2021-07-30 ENCOUNTER — Ambulatory Visit: Payer: BC Managed Care – PPO | Admitting: Rehabilitation

## 2021-07-30 ENCOUNTER — Encounter: Payer: Self-pay | Admitting: Rehabilitation

## 2021-07-30 DIAGNOSIS — Z483 Aftercare following surgery for neoplasm: Secondary | ICD-10-CM

## 2021-07-30 DIAGNOSIS — M6281 Muscle weakness (generalized): Secondary | ICD-10-CM

## 2021-07-30 DIAGNOSIS — I972 Postmastectomy lymphedema syndrome: Secondary | ICD-10-CM

## 2021-07-30 DIAGNOSIS — M25611 Stiffness of right shoulder, not elsewhere classified: Secondary | ICD-10-CM

## 2021-07-30 NOTE — Therapy (Signed)
[facility] @ [place] [place], [place], [address] Phone: [phone]   Fax:  [phone]  Physical Therapy Treatment  Patient Details  Name: Charlene Perry MRN: [id number] Date of Birth: February 24, 1951 Referring Provider (PT): Dr Lindi Adie   Encounter Date: 07/30/2021   PT End of Session - 07/30/21 0908     Visit Number 4    Number of Visits 24    Date for PT Re-Evaluation 08/13/21    PT Start Time 0910    PT Stop Time 0952    PT Time Calculation (min) 42 min    Activity Tolerance Patient tolerated treatment well    Behavior During Therapy [facility] for tasks assessed/performed             Past Medical History:  Diagnosis Date   Arthritis    Breast cancer in female [facility])    Right   Cancer ([place])    Diabetes mellitus without complication ([place])    not on medication   Family history of breast cancer    Family history of colon cancer    Hypertension    takes fluid pills   Hypothyroidism    Pink eye disease of left eye    went to eye md and received drops - yesterday.   Renal disorder    Sleep apnea    back in the 1990's   Vitiligo     Past Surgical History:  Procedure Laterality Date   AV FISTULA PLACEMENT Left 02/21/2020   Procedure: CREATION of LEFT ARM BRACHIOCEPHALIC FISTULA;  Surgeon: Angelia Mould, MD;  Location: [place];  Service: Vascular;  Laterality: Left;   AXILLARY LYMPH NODE DISSECTION Right 07/13/2018   Procedure: RIGHT AXILLARY LYMPH NODE DISSECTION;  Surgeon: [name], MD;  Location: [place];  Service: General;  Laterality: Right;   BREAST BIOPSY Bilateral 01/2019   BREAST SURGERY     left breast fibro adenoma   CATARACT EXTRACTION Right 2019   CERVICAL FUSION N/A    C5 &6   CESAREAN SECTION     CHOLECYSTECTOMY  2004-2005   CYSTECTOMY     patient denies   EYE SURGERY     IR FLUORO GUIDE CV LINE RIGHT  09/07/2019   IR IMAGING GUIDED PORT INSERTION  07/29/2018   IR US GUIDE  VASC ACCESS RIGHT  09/07/2019   MASTECTOMY Right 06/21/2018   MASTECTOMY WITH RADIOACTIVE SEED GUIDED EXCISION AND AXILLARY SENTINEL LYMPH NODE BIOPSY Right 06/21/2018   MASTECTOMY WITH RADIOACTIVE SEED GUIDED EXCISION AND AXILLARY SENTINEL LYMPH NODE BIOPSY Right 06/21/2018   Procedure: RIGHT TOTAL MASTECTOMY WITH RIGHT AXILLARY SENTINEL NODE BIOPSY AND RIGHT SEED GUIDED NODE EXCISION;  Surgeon: [name], MD;  Location: [place];  Service: General;  Laterality: Right;   [place] N/A 07/13/2018   Procedure: INSERTION PORT-A-CATH WITH ULTRASOUND;  Surgeon: [name], MD;  Location: [place];  Service: General;  Laterality: N/A;   TONSILLECTOMY  2002   UVULOPALATOPHARYNGOPLASTY, TONSILLECTOMY AND SEPTOPLASTY      There were no vitals filed for this visit.   Subjective Assessment - 07/30/21 0909     Subjective We got the velcro ordered from [facility].    Pertinent History right breast cancer in 2019  with mastectomy in 2020  with lymph node removal and had to have a second surgery for more lymph node removal  with chemo and radiation. Did fine til end of October 2022 with only  intermittent swelling but now pretty much stays all the time.  She has a sleeve and glove that was refitted as she has lost weight.  and was mostly wearing the glove  but not all the time.  Past history includes end stage renal disease that was discovered in March of 2021 with end stage kidney disease She is on periotoneal disease 5 nights a week.she has a port on the right side of abdomen that she ( Dr.Joseph  Coladonato at [employer]) She has a fistula in left arm. She lives 45 minutes away from here. Past history includes widespread vitiligo    Currently in Pain? No/denies                               [facility] Adult PT Treatment/Exercise - 07/30/21 0001       Manual Therapy   Manual Lymphatic  Drainage (MLD) gave handout to patient and spouse.  Continued educated for spouse and BO:FBPZ demo, vcs,,tcs, and hand over hand cueing for the spouse.  Performed all steps in supine  Spouse needed sig vcs to decrease squeezing and force of pressure but was mindful of this after cueing.  Then PT performed more work in the UE in supine including deeper forearm techniques around the elbow and education on lymphatic anatomy and reasons for each portion.                       PT Short Term Goals - 05/30/21 1450       PT SHORT TERM GOAL #1   Title Pt and husband will be able to perfom manual lymph drainage    Time 6    Period Weeks    Status New      PT SHORT TERM GOAL #2   Title Pt will verbalize importance of elevation and exercise for managment of lymphedema    Time 6    Period Weeks    Status New      PT SHORT TERM GOAL #3   Title Pt and husband will be able to apply compression bandaging to help with managment of lymphedema    Time 6    Period Weeks    Status New               PT Long Term Goals - 05/30/21 1448       PT LONG TERM GOAL #1   Title Pt and husband will report they can  manage her lymphedema at home    Time 10    Period Weeks    Status New      PT LONG TERM GOAL #2   Title Pt will know how to get the compression garments that she needs for management at home    Time 10    Period Weeks    Status New      PT LONG TERM GOAL #3   Title Pt will have 125 degrees of painfree right shoulder abduction so that she can perform her ADls easier    Time 10    Period Weeks    Status New                   Plan - 07/30/21 1009     Clinical Impression Statement Pt has velcro garment ordered for ease of donning/doffing.  Will reassess fit  and continue until ind with self MLD and use of velcro on the Rt UE.    PT Next Visit Plan cont with bandaging/velcro and review with pt and husband prn, and manual lymph drainage also instructing pt and  husband in this as well.    Consulted and Agree with Plan of Care Patient             Patient will benefit from skilled therapeutic intervention in order to improve the following deficits and impairments:     Visit Diagnosis: Postmastectomy lymphedema  Muscle weakness (generalized)  Aftercare following surgery for neoplasm  Stiffness of right shoulder, not elsewhere classified     Problem List Patient Active Problem List   Diagnosis Date Noted   Microproteinuria 01/11/2020   Allergy, unspecified, initial encounter 01/04/2020   Anaphylactic shock, unspecified, initial encounter 01/04/2020   Anemia in chronic kidney disease 12/05/2019   Hypokalemia 12/05/2019   ESRD (end stage renal disease) on dialysis ([place]) 10/03/2019   Moderate protein-calorie malnutrition ([place]) 09/15/2019   Anemia, unspecified 09/09/2019   Coagulation defect, unspecified ([place]) 09/09/2019   Diarrhea, unspecified 09/09/2019   Dyspnea, unspecified 09/09/2019   Essential (primary) hypertension 09/09/2019   Iron deficiency anemia, unspecified 09/09/2019   Obstructive sleep apnea 09/09/2019   Pain, unspecified 09/09/2019   Pruritus, unspecified 09/09/2019   Secondary hyperparathyroidism of renal origin ([place]) 09/09/2019   Other acute kidney failure ([place]) 09/06/2019   Diabetes mellitus type II, non insulin dependent ([place]) 09/03/2019   Acute pyelonephritis    Acute kidney injury ([place]) 09/02/2019   History of therapeutic radiation 04/06/2019   Back pain with radiation 09/13/2018   Port-A-Cath in place 08/24/2018   Breast cancer, right ([place]) 06/21/2018   Genetic testing 01/14/2018   Family history of colon cancer    Family history of breast cancer    Malignant neoplasm of upper-outer quadrant of right breast in female, estrogen receptor positive ([place]) 12/15/2017   Plantar fasciitis, right 09/30/2017   Hyperlipemia 09/26/2011   Hypothyroidism 08/25/2002   Vitiligo 01/26/2002    Stark Bray,  PT 07/30/2021, 10:10 AM  [facility] @ [place], [place], [address] Phone: [phone]   Fax:  [phone]  Name: Charlene Perry MRN: [id number] Date of Birth: [birth year]

## 2021-08-05 ENCOUNTER — Ambulatory Visit: Payer: BC Managed Care – PPO | Admitting: Rehabilitation

## 2021-08-05 ENCOUNTER — Other Ambulatory Visit: Payer: Self-pay

## 2021-08-05 ENCOUNTER — Encounter: Payer: Self-pay | Admitting: Rehabilitation

## 2021-08-05 DIAGNOSIS — M25611 Stiffness of right shoulder, not elsewhere classified: Secondary | ICD-10-CM

## 2021-08-05 DIAGNOSIS — Z483 Aftercare following surgery for neoplasm: Secondary | ICD-10-CM

## 2021-08-05 DIAGNOSIS — I972 Postmastectomy lymphedema syndrome: Secondary | ICD-10-CM | POA: Diagnosis not present

## 2021-08-05 DIAGNOSIS — M6281 Muscle weakness (generalized): Secondary | ICD-10-CM

## 2021-08-05 NOTE — Therapy (Signed)
OUTPATIENT PHYSICAL THERAPY TREATMENT NOTE   Patient Name: Charlene Perry MRN: [id number] DOB:[birth year], 71 y.o., female Today's Date: 08/05/2021  PCP: Valla Leaver, MD REFERRING PROVIDER: Serena Croissant, MD    Past Medical History:  Diagnosis Date   Arthritis    Breast cancer in female [facility])    Right   Cancer [facility])    Diabetes mellitus without complication (HCC)    not on medication   Family history of breast cancer    Family history of colon cancer    Hypertension    takes fluid pills   Hypothyroidism    Pink eye disease of left eye    went to eye md and received drops - yesterday.   Renal disorder    Sleep apnea    back in the 1990's   Vitiligo    Past Surgical History:  Procedure Laterality Date   AV FISTULA PLACEMENT Left 02/21/2020   Procedure: CREATION of LEFT ARM BRACHIOCEPHALIC FISTULA;  Surgeon: Chuck Hint, MD;  Location: [facility] OR;  Service: Vascular;  Laterality: Left;   AXILLARY LYMPH NODE DISSECTION Right 07/13/2018   Procedure: RIGHT AXILLARY LYMPH NODE DISSECTION;  Surgeon: Emelia Loron, MD;  [facility] OR;  Service: General;  Laterality: Right;   BREAST BIOPSY Bilateral 01/2019   BREAST SURGERY     left breast fibro adenoma   CATARACT EXTRACTION Right 2019   CERVICAL FUSION N/A    C5 &6   CESAREAN SECTION     CHOLECYSTECTOMY  2004-2005   CYSTECTOMY     patient denies   EYE SURGERY     IR FLUORO GUIDE CV LINE RIGHT  09/07/2019   IR IMAGING GUIDED PORT INSERTION  07/29/2018   IR US GUIDE VASC ACCESS RIGHT  09/07/2019   MASTECTOMY Right 06/21/2018   MASTECTOMY WITH RADIOACTIVE SEED GUIDED EXCISION AND AXILLARY SENTINEL LYMPH NODE BIOPSY Right 06/21/2018   MASTECTOMY WITH RADIOACTIVE SEED GUIDED EXCISION AND AXILLARY SENTINEL LYMPH NODE BIOPSY Right 06/21/2018   Procedure: RIGHT TOTAL MASTECTOMY WITH RIGHT AXILLARY SENTINEL NODE BIOPSY AND RIGHT SEED GUIDED NODE EXCISION;  Surgeon: Emelia Loron, MD;  Location: [facility]  OR;  Service: General;  Laterality: Right;   MYOMECTOMY  1986   PARTIAL HYSTERECTOMY  1995   PILONIDAL CYST EXCISION     PORTACATH PLACEMENT N/A 07/13/2018   Procedure: INSERTION PORT-A-CATH WITH ULTRASOUND;  Surgeon: Emelia Loron, MD;  Location: [facility] OR;  Service: General;  Laterality: N/A;   TONSILLECTOMY  2002   UVULOPALATOPHARYNGOPLASTY, TONSILLECTOMY AND SEPTOPLASTY     Patient Active Problem List   Diagnosis Date Noted   Microproteinuria 01/11/2020   Allergy, unspecified, initial encounter 01/04/2020   Anaphylactic shock, unspecified, initial encounter 01/04/2020   Anemia in chronic kidney disease 12/05/2019   Hypokalemia 12/05/2019   ESRD (end stage renal disease) on dialysis (HCC) 10/03/2019   Moderate protein-calorie malnutrition (HCC) 09/15/2019   Anemia, unspecified 09/09/2019   Coagulation defect, unspecified (HCC) 09/09/2019   Diarrhea, unspecified 09/09/2019   Dyspnea, unspecified 09/09/2019   Essential (primary) hypertension 09/09/2019   Iron deficiency anemia, unspecified 09/09/2019   Obstructive sleep apnea 09/09/2019   Pain, unspecified 09/09/2019   Pruritus, unspecified 09/09/2019   Secondary hyperparathyroidism of renal origin (HCC) 09/09/2019   Other acute kidney failure (HCC) 09/06/2019   Diabetes mellitus type II, non insulin dependent (HCC) 09/03/2019   Acute pyelonephritis    Acute kidney injury (HCC) 09/02/2019   History of therapeutic radiation 04/06/2019   Back pain with radiation 09/13/2018  Port-A-Cath in place 08/24/2018   Breast cancer, right ([place]) 06/21/2018   Genetic testing 01/14/2018   Family history of colon cancer    Family history of breast cancer    Malignant neoplasm of upper-outer quadrant of right breast in female, estrogen receptor positive ([place]) 12/15/2017   Plantar fasciitis, right 09/30/2017   Hyperlipemia 09/26/2011   Hypothyroidism 08/25/2002   Vitiligo 01/26/2002    REFERRING DIAG: Rt breast cancer  THERAPY DIAG:   Postmastectomy lymphedema  Aftercare following surgery for neoplasm  Stiffness of right shoulder, not elsewhere classified  Muscle weakness (generalized)  Precautions: Rt UE lymphedema active, on peritoneal dialysis   History: right breast cancer in 2019 with mastectomy in 2020 with lymph node removal and had to have a second surgery for more lymph node removal with chemo and radiation. Did fine til end of October 2022 with only intermittent swelling but now pretty much stays all the time. She has a sleeve and glove that was refitted as she has lost weight. and was mostly wearing the glove but not all the time. Past history includes end stage renal disease that was discovered in March of 2021 with end stage kidney disease She is on periotoneal disease 5 nights a week.she has a port on the right side of abdomen that she ( Dr.Joseph Coladonato at [employer]) She has a fistula in left arm. She lives 45 minutes away from here. Past history includes widespread vitiligo  SUBJECTIVE: I got my garment  PAIN:  Are you having pain? No   TODAY'S TREATMENT:  Orthotic fit training: Pt arrives with new Juzo compression wrap.  Instruction on donning, washing instruction, how to switch colors and move velcro, then applied to the pts arm which sits a little short.  Pt noted that she was supposed to get a long but this one is a regular size.  Pt will let them know to switch out.  Other than that the garment and hand piece fit well.  Pt will return in March to make sure this garment is working after a trip to [place].    PATIENT EDUCATION: Education details: care of wrap, donning and doffing Person educated: Patient and Spouse Education method: Explanation, Demonstration, and Verbal cues Education comprehension: verbalized understanding and returned demonstration   HOME EXERCISE PROGRAM: Self MLD   PT Short Term Goals - 05/30/21 1450       PT SHORT TERM GOAL #1   Title Pt and husband will be able  to perfom manual lymph drainage    Time 6    Period Weeks    Status Partially met     PT SHORT TERM GOAL #2   Title Pt will verbalize importance of elevation and exercise for managment of lymphedema    Time 6    Period Weeks    Status Achieved     PT SHORT TERM GOAL #3   Title Pt and husband will be able to apply compression bandaging to help with managment of lymphedema    Time 6    Period Weeks    Status Achieved            Plan - 07/30/21 1009       Clinical Impression Statement Pt will exchange arm garment for a long, but otherwise the garment fits well and pt appreciates the ease compared to bandaging.      PT Next Visit Plan Remeasure after trip; cont with velcro and review with pt and husband prn, and manual lymph  drainage also instructing pt and husband in this as well.     Consulted and Agree with Plan of Care Patient        Stark Bray, PT 08/05/2021, 11:04 AM

## 2021-08-12 ENCOUNTER — Encounter: Payer: Medicare Other | Admitting: Rehabilitation

## 2021-08-21 ENCOUNTER — Other Ambulatory Visit: Payer: Self-pay

## 2021-08-21 ENCOUNTER — Ambulatory Visit: Payer: BC Managed Care – PPO | Attending: Hematology and Oncology | Admitting: Rehabilitation

## 2021-08-21 ENCOUNTER — Encounter: Payer: Self-pay | Admitting: Rehabilitation

## 2021-08-21 DIAGNOSIS — M6281 Muscle weakness (generalized): Secondary | ICD-10-CM | POA: Diagnosis present

## 2021-08-21 DIAGNOSIS — Z483 Aftercare following surgery for neoplasm: Secondary | ICD-10-CM | POA: Diagnosis present

## 2021-08-21 DIAGNOSIS — M25611 Stiffness of right shoulder, not elsewhere classified: Secondary | ICD-10-CM | POA: Insufficient documentation

## 2021-08-21 DIAGNOSIS — I972 Postmastectomy lymphedema syndrome: Secondary | ICD-10-CM | POA: Diagnosis not present

## 2021-08-21 NOTE — Therapy (Signed)
[facility] @ [address] [place], [place], [address] Phone: [phone]   Fax:  [phone]  Physical Therapy Treatment  Patient Details  Name: Charlene Perry MRN: [id number] Date of Birth: [birth year] Referring Provider (PT): Dr Lindi Adie   Encounter Date: 08/21/2021   PT End of Session - 08/21/21 1300     Visit Number 6    Number of Visits 24    Date for PT Re-Evaluation 08/13/21    PT Start Time 1300    PT Stop Time 1325    PT Time Calculation (min) 25 min    Activity Tolerance Patient tolerated treatment well    Behavior During Therapy [facility] for tasks assessed/performed             Past Medical History:  Diagnosis Date   Arthritis    Breast cancer in female [facility])    Right   Cancer ([place])    Diabetes mellitus without complication ([place])    not on medication   Family history of breast cancer    Family history of colon cancer    Hypertension    takes fluid pills   Hypothyroidism    Pink eye disease of left eye    went to eye md and received drops - yesterday.   Renal disorder    Sleep apnea    back in the 1990's   Vitiligo     Past Surgical History:  Procedure Laterality Date   AV FISTULA PLACEMENT Left 02/21/2020   Procedure: CREATION of LEFT ARM BRACHIOCEPHALIC FISTULA;  Surgeon: Angelia Mould, MD;  Location: [facility];  Service: Vascular;  Laterality: Left;   AXILLARY LYMPH NODE DISSECTION Right 07/13/2018   Procedure: RIGHT AXILLARY LYMPH NODE DISSECTION;  Surgeon: [name], MD;  Location: [place];  Service: General;  Laterality: Right;   BREAST BIOPSY Bilateral 01/2019   BREAST SURGERY     left breast fibro adenoma   CATARACT EXTRACTION Right 2019   CERVICAL FUSION N/A    C5 &6   CESAREAN SECTION     CHOLECYSTECTOMY  2004-2005   CYSTECTOMY     patient denies   EYE SURGERY     IR FLUORO GUIDE CV LINE RIGHT  09/07/2019   IR IMAGING GUIDED PORT INSERTION  07/29/2018   IR US GUIDE  VASC ACCESS RIGHT  09/07/2019   MASTECTOMY Right 06/21/2018   MASTECTOMY WITH RADIOACTIVE SEED GUIDED EXCISION AND AXILLARY SENTINEL LYMPH NODE BIOPSY Right 06/21/2018   MASTECTOMY WITH RADIOACTIVE SEED GUIDED EXCISION AND AXILLARY SENTINEL LYMPH NODE BIOPSY Right 06/21/2018   Procedure: RIGHT TOTAL MASTECTOMY WITH RIGHT AXILLARY SENTINEL NODE BIOPSY AND RIGHT SEED GUIDED NODE EXCISION;  Surgeon: [name], MD;  Location: [place];  Service: General;  Laterality: Right;   [place] N/A 07/13/2018   Procedure: INSERTION PORT-A-CATH WITH ULTRASOUND;  Surgeon: [name], MD;  Location: [facility];  Service: General;  Laterality: N/A;   TONSILLECTOMY  2002   UVULOPALATOPHARYNGOPLASTY, TONSILLECTOMY AND SEPTOPLASTY      There were no vitals filed for this visit.   Subjective Assessment - 08/21/21 1300     Subjective Our trip was busy but I am okay. My arm is really down!    Pertinent History right breast cancer in 2019  with mastectomy in 2020  with lymph node removal and had to have a second surgery for  more lymph node removal with chemo and radiation. Did fine til end of October 2022 with only  intermittent swelling but now pretty much stays all the time.  She has a sleeve and glove that was refitted as she has lost weight.  and was mostly wearing the glove  but not all the time.  Past history includes end stage renal disease that was discovered in March of 2021 with end stage kidney disease She is on periotoneal disease 5 nights a week.she has a port on the right side of abdomen that she ( Dr.Joseph  Coladonato at [employer]) She has a fistula in left arm. She lives 45 minutes away from here. Past history includes widespread vitiligo    Currently in Pain? No/denies                   LYMPHEDEMA/ONCOLOGY QUESTIONNAIRE - 08/21/21 0001       Right Upper Extremity Lymphedema   15 cm Proximal to  Olecranon Process 36.5 cm    10 cm Proximal to Olecranon Process 35.5 cm    Olecranon Process 29 cm    15 cm Proximal to Ulnar Styloid Process 27 cm    10 cm Proximal to Ulnar Styloid Process 23 cm    Just Proximal to Ulnar Styloid Process 17.2 cm    Across Hand at [employer] 19.7 cm    At [place] of 2nd Digit 7 cm                        [facility] Adult PT Treatment/Exercise - 08/21/21 0001       Manual Therapy   Edema Management remeasured UE, reviewed donning with husband who had some questions about application, and discussed pt returning with current regular sleeves to make sure she has a regular flat knit sleeve as well.                       PT Short Term Goals - 05/30/21 1450       PT SHORT TERM GOAL #1   Title Pt and husband will be able to perfom manual lymph drainage    Time 6    Period Weeks    Status New      PT SHORT TERM GOAL #2   Title Pt will verbalize importance of elevation and exercise for managment of lymphedema    Time 6    Period Weeks    Status New      PT SHORT TERM GOAL #3   Title Pt and husband will be able to apply compression bandaging to help with managment of lymphedema    Time 6    Period Weeks    Status New               PT Long Term Goals - 05/30/21 1448       PT LONG TERM GOAL #1   Title Pt and husband will report they can  manage her lymphedema at home    Time 10    Period Weeks    Status New      PT LONG TERM GOAL #2   Title Pt will know how to get the compression garments that she needs for management at home    Time 10    Period Weeks    Status New      PT LONG TERM GOAL #3   Title Pt will have 125 degrees of painfree  right shoulder abduction so that she can perform her ADls easier    Time 10    Period Weeks    Status New                   Plan - 08/21/21 1330     Clinical Impression Statement Pt is seeing great results using the new velcro garment.  Still with some fibrosis  forearm but forearm has decreased 4-5cm overall.  Discussed pt returning with current regular sleeves to make sure she has a regular flat knit sleeve as well. She thinks her current sleeve may be circular knit.    PT Frequency Biweekly    PT Treatment/Interventions ADLs/Self Care Home Management;Therapeutic exercise;Taping;Passive range of motion;Patient/family education;Orthotic Fit/Training;Manual lymph drainage;Compression bandaging;Manual techniques;Therapeutic activities    PT Next Visit Plan assess current garments and order new flat knit as needed    Consulted and Agree with Plan of Care Patient;Family member/caregiver    Family Member Consulted husband             Patient will benefit from skilled therapeutic intervention in order to improve the following deficits and impairments:  Decreased knowledge of use of DME, Decreased knowledge of precautions, Decreased range of motion, Increased edema, Postural dysfunction, Impaired UE functional use, Increased fascial restricitons, Decreased strength  Visit Diagnosis: Postmastectomy lymphedema  Aftercare following surgery for neoplasm  Stiffness of right shoulder, not elsewhere classified  Muscle weakness (generalized)     Problem List Patient Active Problem List   Diagnosis Date Noted   Microproteinuria 01/11/2020   Allergy, unspecified, initial encounter 01/04/2020   Anaphylactic shock, unspecified, initial encounter 01/04/2020   Anemia in chronic kidney disease 12/05/2019   Hypokalemia 12/05/2019   ESRD (end stage renal disease) on dialysis ([place]) 10/03/2019   Moderate protein-calorie malnutrition ([place]) 09/15/2019   Anemia, unspecified 09/09/2019   Coagulation defect, unspecified ([place]) 09/09/2019   Diarrhea, unspecified 09/09/2019   Dyspnea, unspecified 09/09/2019   Essential (primary) hypertension 09/09/2019   Iron deficiency anemia, unspecified 09/09/2019   Obstructive sleep apnea 09/09/2019   Pain, unspecified  09/09/2019   Pruritus, unspecified 09/09/2019   Secondary hyperparathyroidism of renal origin ([place]) 09/09/2019   Other acute kidney failure ([place]) 09/06/2019   Diabetes mellitus type II, non insulin dependent ([place]) 09/03/2019   Acute pyelonephritis    Acute kidney injury ([place]) 09/02/2019   History of therapeutic radiation 04/06/2019   Back pain with radiation 09/13/2018   Port-A-Cath in place 08/24/2018   Breast cancer, right ([place]) 06/21/2018   Genetic testing 01/14/2018   Family history of colon cancer    Family history of breast cancer    Malignant neoplasm of upper-outer quadrant of right breast in female, estrogen receptor positive ([place]) 12/15/2017   Plantar fasciitis, right 09/30/2017   Hyperlipemia 09/26/2011   Hypothyroidism 08/25/2002   Vitiligo 01/26/2002    Stark Bray, PT 08/21/2021, 1:31 PM  [facility] @ [address] [place], [place], [address] Phone: [phone]   Fax:  [phone]  Name: Charlene Perry MRN: [id number] Date of Birth: 29-Jan-1951

## 2021-08-22 ENCOUNTER — Other Ambulatory Visit: Payer: Self-pay | Admitting: Family Medicine

## 2021-08-22 DIAGNOSIS — Z1231 Encounter for screening mammogram for malignant neoplasm of breast: Secondary | ICD-10-CM

## 2021-08-26 NOTE — Progress Notes (Incomplete)
Patient Care Team: [name], MD as PCP - General (Family Medicine) [name], MD as Consulting Physician (Hematology and Oncology) [name], MD as Consulting Physician (Radiation Oncology) [name], MD as Consulting Physician (General Surgery) [facility]  DIAGNOSIS: No diagnosis found.  SUMMARY OF ONCOLOGIC HISTORY: Oncology History  Malignant neoplasm of upper-outer quadrant of right breast in female, estrogen receptor positive ([place])  11/12/2017 Initial Diagnosis   Palpable lesion in the right breast upper outer quadrant mammogram and ultrasound revealed 1.4 cm tumor ultrasound-guided biopsy revealed grade 2 IDC ER 3+, PR 3+, HER-2 negative with a Ki-67 of 10%.  Right axillary lymph node biopsy positive, T1cN1 stage I a clinical stage   12/23/2017 Cancer Staging   Staging form: Breast, AJCC 8th Edition - Clinical: Stage IB (cT1c, cN1, cM0, G2, ER+, PR+, HER2-) - Signed by Gardenia Phlegm, NP on 12/23/2017    01/10/2018 Breast MRI   Biopsy-proven malignancy UOQ right breast measuring 6 cm.  Anterior inferior to this is a 1 cm mass which needs biopsy.  Focal non-mass enhancement LOQ retroareolar right breast, left breast 7 mm irregular enhancement, 2 suspicious right axillary lymph nodes   01/27/2018 Procedure   Right breast biopsy UOQ: Grade 1 ILC, ALH Right breast biopsy 6:00: LCIS Left breast biopsy: Benign   06/21/2018 Surgery   Rt Mastectomy: 7 cm fibrotic mass ILC Grade 2, 5/6 LN Positive, ER 90-100%, PR 5%-70%, Ki 67 2-10%, Her 2 Neg, T3N2   07/13/2018 Surgery   Right axillary lymph node dissection: 3/10 lymph nodes positive making total number of lymph nodes 8/16   08/10/2018 - 12/28/2018 Chemotherapy   Adjuvant chemotherapy with dose dense Adriamycin and Cytoxan x4 followed by Taxol weekly x12   01/24/2019 -  Radiation Therapy   Adjuvant radiation treatment   03/2019 -  Anti-estrogen oral therapy   Letrozole daily      CHIEF COMPLIANT: Follow-up of right breast cancer on letrozole   INTERVAL HISTORY: Charlene Perry 71 y.o. with above-mentioned history of right breast cancer who underwent a right mastectomy, adjuvant chemotherapy, radiation, and is currently on antiestrogen therapy with letrozole. Mammogram on 08/26/19 showed no evidence of malignancy bilaterally. She presents to the clinic today for follow-up.   ALLERGIES:  has No Known Allergies.  MEDICATIONS:  Current Outpatient Medications  Medication Sig Dispense Refill   aspirin 81 MG chewable tablet Chew by mouth.     carvedilol (COREG) 25 MG tablet Take 25 mg by mouth 2 (two) times daily.     cetirizine (ZYRTEC) 10 MG tablet Take 10 mg by mouth daily.     Docusate Sodium (DSS) 100 MG CAPS Take by mouth.     gentamicin cream (GARAMYCIN) 0.1 % Apply topically.     letrozole (FEMARA) 2.5 MG tablet Take 1 tablet by mouth once daily 90 tablet 3   levothyroxine (SYNTHROID, LEVOTHROID) 125 MCG tablet Take 125 mcg by mouth daily before breakfast.     potassium chloride (KLOR-CON) 10 MEQ tablet Take 1 tablet (10 mEq total) by mouth daily.     rosuvastatin (CRESTOR) 40 MG tablet Take 40 mg by mouth at bedtime.      No current facility-administered medications for this visit.    PHYSICAL EXAMINATION: ECOG PERFORMANCE STATUS: {CHL ONC ECOG [phone]}  There were no vitals filed for this visit. There were no vitals filed for this visit.  BREAST:*** No palpable masses or nodules in either right or left breasts. No palpable axillary supraclavicular or  infraclavicular adenopathy no breast tenderness or nipple discharge. (exam performed in the presence of a chaperone)  LABORATORY DATA:  I have reviewed the data as listed CMP Latest Ref Rng & Units 02/21/2020 09/09/2019 09/08/2019  Glucose 70 - 99 mg/dL 134(H) 93 111(H)  BUN 8 - 23 mg/dL 28(H) 14 25(H)  Creatinine 0.44 - 1.00 mg/dL 4.90(H) 5.07(H) 7.17(H)  Sodium 135 - 145 mmol/L 137 139 139   Potassium 3.5 - 5.1 mmol/L 4.1 4.0 4.1  Chloride 98 - 111 mmol/L 101 101 104  CO2 22 - 32 mmol/L - 27 24  Calcium 8.9 - 10.3 mg/dL - 8.2(L) 8.2(L)  Total Protein 6.5 - 8.1 g/dL - - -  Total Bilirubin 0.3 - 1.2 mg/dL - - -  Alkaline Phos 38 - 126 U/L - - -  AST 15 - 41 U/L - - -  ALT 0 - 44 U/L - - -    Lab Results  Component Value Date   WBC 4.9 09/08/2019   HGB 12.2 02/21/2020   HCT 36.0 02/21/2020   MCV 90.4 09/08/2019   PLT 237 09/08/2019   NEUTROABS 7.0 09/03/2019    ASSESSMENT & PLAN:  No problem-specific Assessment & Plan notes found for this encounter.    No orders of the defined types were placed in this encounter.  The patient has a good understanding of the overall plan. she agrees with it. she will call with any problems that may develop before the next visit here. Total time spent: 30 mins including face to face time and time spent for planning, charting and co-ordination of care   Suzzette Righter, [place] 08/26/21    I, Gardiner Coins, am acting as a scribe for Dr. Lindi Adie

## 2021-08-27 ENCOUNTER — Other Ambulatory Visit: Payer: Self-pay

## 2021-08-27 ENCOUNTER — Inpatient Hospital Stay: Payer: BC Managed Care – PPO | Attending: Hematology and Oncology | Admitting: Hematology and Oncology

## 2021-08-27 ENCOUNTER — Encounter: Payer: Self-pay | Admitting: *Deleted

## 2021-08-27 DIAGNOSIS — Z79899 Other long term (current) drug therapy: Secondary | ICD-10-CM | POA: Insufficient documentation

## 2021-08-27 DIAGNOSIS — N186 End stage renal disease: Secondary | ICD-10-CM | POA: Insufficient documentation

## 2021-08-27 DIAGNOSIS — Z9011 Acquired absence of right breast and nipple: Secondary | ICD-10-CM | POA: Insufficient documentation

## 2021-08-27 DIAGNOSIS — Z170 Estrogen receptor positive status [ER+]: Secondary | ICD-10-CM | POA: Insufficient documentation

## 2021-08-27 DIAGNOSIS — C50411 Malignant neoplasm of upper-outer quadrant of right female breast: Secondary | ICD-10-CM | POA: Diagnosis present

## 2021-08-27 DIAGNOSIS — Z992 Dependence on renal dialysis: Secondary | ICD-10-CM | POA: Diagnosis not present

## 2021-08-27 DIAGNOSIS — Z79811 Long term (current) use of aromatase inhibitors: Secondary | ICD-10-CM | POA: Diagnosis not present

## 2021-08-27 DIAGNOSIS — R079 Chest pain, unspecified: Secondary | ICD-10-CM | POA: Diagnosis not present

## 2021-08-27 MED ORDER — LETROZOLE 2.5 MG PO TABS: 2.5000 mg | ORAL_TABLET | Freq: Every day | ORAL | 3 refills | Status: DC

## 2021-08-27 MED ORDER — FUROSEMIDE 20 MG PO TABS: 20.0000 mg | ORAL_TABLET | Freq: Every day | ORAL | Status: AC

## 2021-08-27 MED ORDER — DSS 100 MG PO CAPS: 1.0000 | ORAL_CAPSULE | Freq: Two times a day (BID) | ORAL | Status: DC

## 2021-08-27 MED ORDER — POTASSIUM CHLORIDE ER 20 MEQ PO TBCR: 20.0000 meq | EXTENDED_RELEASE_TABLET | Freq: Every day | ORAL | Status: AC

## 2021-08-27 NOTE — Progress Notes (Deleted)
? ?Patient Care Team: ?[name], MD as PCP - General (Family Medicine) ?[name], MD as Consulting Physician (Hematology and Oncology) ?[name], MD as Consulting Physician (Radiation Oncology) ?[name], MD as Consulting Physician (General Surgery) ?[facility] ? ?DIAGNOSIS:  ?Encounter Diagnosis  ?Name Primary?  ? Malignant neoplasm of upper-outer quadrant of right breast in female, estrogen receptor positive ([place])   ? ? ?SUMMARY OF ONCOLOGIC HISTORY: ?Oncology History  ?Malignant neoplasm of upper-outer quadrant of right breast in female, estrogen receptor positive ([place])  ?11/12/2017 Initial Diagnosis  ? Palpable lesion in the right breast upper outer quadrant mammogram and ultrasound revealed 1.4 cm tumor ultrasound-guided biopsy revealed grade 2 IDC ER 3+, PR 3+, HER-2 negative with a Ki-67 of 10%.  Right axillary lymph node biopsy positive, T1cN1 stage I a clinical stage ?  ?12/23/2017 Cancer Staging  ? Staging form: Breast, AJCC 8th Edition ?- Clinical: Stage IB (cT1c, cN1, cM0, G2, ER+, PR+, HER2-) - Signed by Gardenia Phlegm, NP on 12/23/2017 ? ?  ?01/10/2018 Breast MRI  ? Biopsy-proven malignancy UOQ right breast measuring 6 cm.  Anterior inferior to this is a 1 cm mass which needs biopsy.  Focal non-mass enhancement LOQ retroareolar right breast, left breast 7 mm irregular enhancement, 2 suspicious right axillary lymph nodes ?  ?01/27/2018 Procedure  ? Right breast biopsy UOQ: Grade 1 ILC, ALH ?Right breast biopsy 6:00: LCIS ?Left breast biopsy: Benign ?  ?06/21/2018 Surgery  ? Rt Mastectomy: 7 cm fibrotic mass ILC Grade 2, 5/6 LN Positive, ER 90-100%, PR 5%-70%, Ki 67 2-10%, Her 2 Neg, T3N2 ?  ?07/13/2018 Surgery  ? Right axillary lymph node dissection: 3/10 lymph nodes positive making total number of lymph nodes 8/16 ?  ?08/10/2018 - 12/28/2018 Chemotherapy  ? Adjuvant chemotherapy with dose dense Adriamycin and Cytoxan x4 followed by Taxol weekly x12 ?   ?01/24/2019 -  Radiation Therapy  ? Adjuvant radiation treatment ?  ?03/2019 -  Anti-estrogen oral therapy  ? Letrozole daily ?  ? ? ?CHIEF COMPLIANT: Follow-up of right breast cancer on letrozole  ? ?INTERVAL HISTORY: Charlene Perry 71 y.o. with above-mentioned history of right breast cancer who underwent a right mastectomy, adjuvant chemotherapy, radiation, and is currently on antiestrogen therapy with letrozole. Mammogram on 08/26/19 showed no evidence of malignancy bilaterally. She presents to the clinic today for follow-up. She said everything was doing ok. She state that she is tolerating the Letrozole. She also state that she is on dialysis. She state that she has no pain in chest, but says her bra causes pain underneath chest.   ? ?ALLERGIES:  has No Known Allergies. ? ?MEDICATIONS:  ?Current Outpatient Medications  ?Medication Sig Dispense Refill  ? furosemide (LASIX) 20 MG tablet Take 1 tablet (20 mg total) by mouth daily. 30 tablet   ? aspirin 81 MG chewable tablet Chew by mouth.    ? carvedilol (COREG) 25 MG tablet Take 25 mg by mouth 2 (two) times daily.    ? cetirizine (ZYRTEC) 10 MG tablet Take 10 mg by mouth daily.    ? Docusate Sodium (DSS) 100 MG CAPS Take 1 capsule by mouth 2 (two) times daily. 60 capsule   ? gentamicin cream (GARAMYCIN) 0.1 % Apply topically.    ? letrozole (FEMARA) 2.5 MG tablet Take 1 tablet (2.5 mg total) by mouth daily. 90 tablet 3  ? levothyroxine (SYNTHROID, LEVOTHROID) 125 MCG tablet Take 125 mcg by mouth daily before breakfast.    ?  potassium chloride 20 MEQ TBCR Take 20 mEq by mouth daily.    ? rosuvastatin (CRESTOR) 40 MG tablet Take 40 mg by mouth at bedtime.     ? ?No current facility-administered medications for this visit.  ? ? ?PHYSICAL EXAMINATION: ?ECOG PERFORMANCE STATUS: 1 - Symptomatic but completely ambulatory ? ?Vitals:  ? 08/27/21 1400  ?BP: (!) 162/73  ?Pulse: 83  ?Resp: 18  ?Temp: (!) 97.3 ?F (36.3 ?C)  ?SpO2: 99%  ? ?Filed Weights  ? 08/27/21 1400   ?Weight: 212 lb 8 oz (96.4 kg)  ? ? ?BREAST: No palpable lumps nodules in the left breast or right chest wall or axilla. (exam performed in the presence of a chaperone) ? ?LABORATORY DATA:  ?I have reviewed the data as listed ?CMP Latest Ref Rng & Units 02/21/2020 09/09/2019 09/08/2019  ?Glucose 70 - 99 mg/dL 134(H) 93 111(H)  ?BUN 8 - 23 mg/dL 28(H) 14 25(H)  ?Creatinine 0.44 - 1.00 mg/dL 4.90(H) 5.07(H) 7.17(H)  ?Sodium 135 - 145 mmol/L 137 139 139  ?Potassium 3.5 - 5.1 mmol/L 4.1 4.0 4.1  ?Chloride 98 - 111 mmol/L 101 101 104  ?CO2 22 - 32 mmol/L - 27 24  ?Calcium 8.9 - 10.3 mg/dL - 8.2(L) 8.2(L)  ?Total Protein 6.5 - 8.1 g/dL - - -  ?Total Bilirubin 0.3 - 1.2 mg/dL - - -  ?Alkaline Phos 38 - 126 U/L - - -  ?AST 15 - 41 U/L - - -  ?ALT 0 - 44 U/L - - -  ? ? ?Lab Results  ?Component Value Date  ? WBC 4.9 09/08/2019  ? HGB 12.2 02/21/2020  ? HCT 36.0 02/21/2020  ? MCV 90.4 09/08/2019  ? PLT 237 09/08/2019  ? NEUTROABS 7.0 09/03/2019  ? ? ?ASSESSMENT & PLAN:  ?Malignant neoplasm of upper-outer quadrant of right breast in female, estrogen receptor positive ([place]) ?.06/21/17: Rt Mastectomy: 7 cm fibrotic mass ILC Grade 2, 5/6 LN Positive, ER 90-100%, PR 5%-70%, Ki 67 2-10%, Her 2 Neg, T3N2 ?Treatment plan: ?1. Adj chemo with dose dense Adriamycin and Cytoxan followed by Taxol x12 (08/10/2018-12/28/2018) ?2. Adj RT started 01/24/2019-03/07/2019 ?3. Adj Anti estrogen therapy ?  ?CT CAP: 07/02/2018: Subtle hypoattenuating lesion upper pole left kidney could be a cyst, urology consulted, left adrenal adenoma. ?--------------------------------------------------------------------------------------------------------------------- ?Current treatment: Adjuvant antiestrogen therapy with anastrozole 1 mg daily to start 03/17/2019 ?Anastrozole toxicities ?She is tolerating anastrozole without any problems or concerns.  Denies any hot flashes. ?Mild joint aches and pains. ?  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  She is very happy  with peritoneal dialysis since she now has the ability to travel and do things through the day.  She has a fistula on the left arm. ?I discussed with her extensively that from our standpoint, at this point, she can proceed with kidney transplant posttransplant immunosuppressive therapy would be acceptable and would not increase her risk of breast cancer recurrence. ?  ?Breast cancer surveillance: ?1.  Left breast mammogram 09/21/2020: Benign breast density category D ?2. breast exam 08/27/2021: Benign ?  ?Return to clinic in 1 year for follow-up ? ? ? ?No orders of the defined types were placed in this encounter. ? ?The patient has a good understanding of the overall plan. she agrees with it. she will call with any problems that may develop before the next visit here. ?Total time spent: 30 mins including face to face time and time spent for planning, charting and co-ordination of care ? ? Charlene Perry  Lindi Adie, MD ?08/27/21 ? ? ? I, Gardiner Coins, am acting as a scribe for Dr. Lindi Adie  ?

## 2021-08-27 NOTE — Assessment & Plan Note (Addendum)
.  06/21/17:?Rt Mastectomy: 7 cm fibrotic mass ILC Grade 2, 5/6 LN Positive, ER 90-100%, PR 5%-70%, Ki 67 2-10%, Her 2 Neg, T3N2 ?Treatment plan: ?1. Adj chemo?with dose dense Adriamycin and Cytoxan followed by Taxol x12 (08/10/2018-12/28/2018) ?2. Adj RT?started 01/24/2019-03/07/2019 ?3. Adj Anti estrogen therapy ?? ?CT CAP: 07/02/2018: Subtle hypoattenuating lesion upper pole left kidney could be a cyst, urology consulted, left adrenal adenoma. ?--------------------------------------------------------------------------------------------------------------------- ?Current treatment: Adjuvant antiestrogen therapy with anastrozole 1 mg daily to start 03/17/2019 ?Anastrozole toxicities ?She is tolerating anastrozole without any problems or concerns.  Denies any hot flashes. ?Mild joint aches and pains. ?? 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  She is very happy with peritoneal dialysis since she now has the ability to travel and do things through the day.  She has a fistula on the left arm. ?I discussed with her extensively that from our standpoint, at this point, she can proceed with kidney transplant posttransplant immunosuppressive therapy would be acceptable and would not increase her risk of breast cancer recurrence. ?? ?Breast cancer surveillance: ?1.  Left breast mammogram 09/21/2020: Benign breast density category D ?2. breast exam 08/27/2021: Benign ?? ?Return to clinic in 1 year for follow-up ?

## 2021-08-27 NOTE — Progress Notes (Signed)
? ?Patient Care Team: ?[name], MD as PCP - General (Family Medicine) ?[name], MD as Consulting Physician (Hematology and Oncology) ?[name], MD as Consulting Physician (Radiation Oncology) ?[name], MD as Consulting Physician (General Surgery) ?[facility] ? ?DIAGNOSIS:  ?Encounter Diagnosis  ?Name Primary?  ? Malignant neoplasm of upper-outer quadrant of right breast in female, estrogen receptor positive ([place])   ? ? ?SUMMARY OF ONCOLOGIC HISTORY: ?Oncology History  ?Malignant neoplasm of upper-outer quadrant of right breast in female, estrogen receptor positive ([place])  ?11/12/2017 Initial Diagnosis  ? Palpable lesion in the right breast upper outer quadrant mammogram and ultrasound revealed 1.4 cm tumor ultrasound-guided biopsy revealed grade 2 IDC ER 3+, PR 3+, HER-2 negative with a Ki-67 of 10%.  Right axillary lymph node biopsy positive, T1cN1 stage I a clinical stage ?  ?12/23/2017 Cancer Staging  ? Staging form: Breast, AJCC 8th Edition ?- Clinical: Stage IB (cT1c, cN1, cM0, G2, ER+, PR+, HER2-) - Signed by Gardenia Phlegm, NP on 12/23/2017 ?  ?01/10/2018 Breast MRI  ? Biopsy-proven malignancy UOQ right breast measuring 6 cm.  Anterior inferior to this is a 1 cm mass which needs biopsy.  Focal non-mass enhancement LOQ retroareolar right breast, left breast 7 mm irregular enhancement, 2 suspicious right axillary lymph nodes ?  ?01/27/2018 Procedure  ? Right breast biopsy UOQ: Grade 1 ILC, ALH ?Right breast biopsy 6:00: LCIS ?Left breast biopsy: Benign ?  ?06/21/2018 Surgery  ? Rt Mastectomy: 7 cm fibrotic mass ILC Grade 2, 5/6 LN Positive, ER 90-100%, PR 5%-70%, Ki 67 2-10%, Her 2 Neg, T3N2 ?  ?07/13/2018 Surgery  ? Right axillary lymph node dissection: 3/10 lymph nodes positive making total number of lymph nodes 8/16 ?  ?08/10/2018 - 12/28/2018 Chemotherapy  ? Adjuvant chemotherapy with dose dense Adriamycin and Cytoxan x4 followed by Taxol weekly x12 ?   ?01/24/2019 -  Radiation Therapy  ? Adjuvant radiation treatment ?  ?03/2019 -  Anti-estrogen oral therapy  ? Letrozole daily ?  ? ? ?CHIEF COMPLIANT: Follow-up of right breast cancer on letrozole  ? ?INTERVAL HISTORY: Charlene Perry 71 y.o. with above-mentioned history of right breast cancer who underwent a right mastectomy, adjuvant chemotherapy, radiation, and is currently on antiestrogen therapy with letrozole. Mammogram on 08/26/19 showed no evidence of malignancy bilaterally. She presents to the clinic today for follow-up. She said everything was doing ok. She state that she is tolerating the Letrozole. She also state that she is on dialysis. She state that she has no pain in chest, but says her bra causes pain underneath chest.   ? ?ALLERGIES:  has No Known Allergies. ? ?MEDICATIONS:  ?Current Outpatient Medications  ?Medication Sig Dispense Refill  ? furosemide (LASIX) 20 MG tablet Take 1 tablet (20 mg total) by mouth daily. 30 tablet   ? aspirin 81 MG chewable tablet Chew by mouth.    ? carvedilol (COREG) 25 MG tablet Take 25 mg by mouth 2 (two) times daily.    ? cetirizine (ZYRTEC) 10 MG tablet Take 10 mg by mouth daily.    ? Docusate Sodium (DSS) 100 MG CAPS Take 1 capsule by mouth 2 (two) times daily. 60 capsule   ? gentamicin cream (GARAMYCIN) 0.1 % Apply topically.    ? letrozole (FEMARA) 2.5 MG tablet Take 1 tablet (2.5 mg total) by mouth daily. 90 tablet 3  ? levothyroxine (SYNTHROID, LEVOTHROID) 125 MCG tablet Take 125 mcg by mouth daily before breakfast.    ? potassium  chloride 20 MEQ TBCR Take 20 mEq by mouth daily.    ? rosuvastatin (CRESTOR) 40 MG tablet Take 40 mg by mouth at bedtime.     ? ?No current facility-administered medications for this visit.  ? ? ?PHYSICAL EXAMINATION: ?ECOG PERFORMANCE STATUS: 1 - Symptomatic but completely ambulatory ? ?Vitals:  ? 08/27/21 1400  ?BP: (!) 162/73  ?Pulse: 83  ?Resp: 18  ?Temp: (!) 97.3 ?F (36.3 ?C)  ?SpO2: 99%  ? ?Filed Weights  ? 08/27/21 1400   ?Weight: 212 lb 8 oz (96.4 kg)  ? ? ?BREAST: No palpable lumps nodules in the left breast or right chest wall or axilla. (exam performed in the presence of a chaperone) ? ?LABORATORY DATA:  ?I have reviewed the data as listed ?CMP Latest Ref Rng & Units 02/21/2020 09/09/2019 09/08/2019  ?Glucose 70 - 99 mg/dL 134(H) 93 111(H)  ?BUN 8 - 23 mg/dL 28(H) 14 25(H)  ?Creatinine 0.44 - 1.00 mg/dL 4.90(H) 5.07(H) 7.17(H)  ?Sodium 135 - 145 mmol/L 137 139 139  ?Potassium 3.5 - 5.1 mmol/L 4.1 4.0 4.1  ?Chloride 98 - 111 mmol/L 101 101 104  ?CO2 22 - 32 mmol/L - 27 24  ?Calcium 8.9 - 10.3 mg/dL - 8.2(L) 8.2(L)  ?Total Protein 6.5 - 8.1 g/dL - - -  ?Total Bilirubin 0.3 - 1.2 mg/dL - - -  ?Alkaline Phos 38 - 126 U/L - - -  ?AST 15 - 41 U/L - - -  ?ALT 0 - 44 U/L - - -  ? ? ?Lab Results  ?Component Value Date  ? WBC 4.9 09/08/2019  ? HGB 12.2 02/21/2020  ? HCT 36.0 02/21/2020  ? MCV 90.4 09/08/2019  ? PLT 237 09/08/2019  ? NEUTROABS 7.0 09/03/2019  ? ? ?ASSESSMENT & PLAN:  ?Malignant neoplasm of upper-outer quadrant of right breast in female, estrogen receptor positive ([place]) ?.06/21/17: Rt Mastectomy: 7 cm fibrotic mass ILC Grade 2, 5/6 LN Positive, ER 90-100%, PR 5%-70%, Ki 67 2-10%, Her 2 Neg, T3N2 ?Treatment plan: ?1. Adj chemo with dose dense Adriamycin and Cytoxan followed by Taxol x12 (08/10/2018-12/28/2018) ?2. Adj RT started 01/24/2019-03/07/2019 ?3. Adj Anti estrogen therapy ?  ?CT CAP: 07/02/2018: Subtle hypoattenuating lesion upper pole left kidney could be a cyst, urology consulted, left adrenal adenoma. ?--------------------------------------------------------------------------------------------------------------------- ?Current treatment: Adjuvant antiestrogen therapy with anastrozole 1 mg daily to start 03/17/2019 ?Anastrozole toxicities ?She is tolerating anastrozole without any problems or concerns.  Denies any hot flashes. ?Mild joint aches and pains. ?  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  She is very happy  with peritoneal dialysis since she now has the ability to travel and do things through the day.  She has a fistula on the left arm. ?I discussed with her extensively that from our standpoint, at this point, she can proceed with kidney transplant posttransplant immunosuppressive therapy would be acceptable and would not increase her risk of breast cancer recurrence. ?  ?Breast cancer surveillance: ?1.  Left breast mammogram 09/21/2020: Benign breast density category D ?2. breast exam 08/27/2021: Benign ?  ?Return to clinic in 1 year for follow-up ? ? ? ?No orders of the defined types were placed in this encounter. ? ?The patient has a good understanding of the overall plan. she agrees with it. she will call with any problems that may develop before the next visit here. ?Total time spent: 30 mins including face to face time and time spent for planning, charting and co-ordination of care ? ? Harriette Ohara,  MD ?08/27/21 ? ? ? I, Gardiner Coins, am acting as a scribe for Dr. Lindi Adie  ?

## 2021-08-27 NOTE — Progress Notes (Signed)
Per pt request, RN mailed kidney transplant letter to address on file.  ?

## 2021-09-04 ENCOUNTER — Encounter: Payer: Self-pay | Admitting: Rehabilitation

## 2021-09-05 ENCOUNTER — Other Ambulatory Visit: Payer: Self-pay

## 2021-09-05 ENCOUNTER — Emergency Department (HOSPITAL_COMMUNITY)
Admission: EM | Admit: 2021-09-05 | Discharge: 2021-09-05 | Disposition: A | Discharge status: Home / Self Care | Payer: BC Managed Care – PPO | Attending: Emergency Medicine | Admitting: Emergency Medicine

## 2021-09-05 ENCOUNTER — Encounter (HOSPITAL_COMMUNITY): Payer: Self-pay

## 2021-09-05 ENCOUNTER — Other Ambulatory Visit: Payer: Self-pay | Admitting: *Deleted

## 2021-09-05 DIAGNOSIS — K529 Noninfective gastroenteritis and colitis, unspecified: Secondary | ICD-10-CM | POA: Insufficient documentation

## 2021-09-05 DIAGNOSIS — Z992 Dependence on renal dialysis: Secondary | ICD-10-CM | POA: Insufficient documentation

## 2021-09-05 DIAGNOSIS — D649 Anemia, unspecified: Secondary | ICD-10-CM | POA: Insufficient documentation

## 2021-09-05 DIAGNOSIS — Z7982 Long term (current) use of aspirin: Secondary | ICD-10-CM | POA: Diagnosis not present

## 2021-09-05 DIAGNOSIS — N189 Chronic kidney disease, unspecified: Secondary | ICD-10-CM | POA: Diagnosis not present

## 2021-09-05 DIAGNOSIS — R1110 Vomiting, unspecified: Secondary | ICD-10-CM | POA: Diagnosis present

## 2021-09-05 DIAGNOSIS — R748 Abnormal levels of other serum enzymes: Secondary | ICD-10-CM | POA: Insufficient documentation

## 2021-09-05 DIAGNOSIS — C50411 Malignant neoplasm of upper-outer quadrant of right female breast: Secondary | ICD-10-CM

## 2021-09-05 DIAGNOSIS — Z170 Estrogen receptor positive status [ER+]: Secondary | ICD-10-CM

## 2021-09-05 LAB — URINALYSIS, ROUTINE W REFLEX MICROSCOPIC
Bilirubin Urine: NEGATIVE
Glucose, UA: NEGATIVE mg/dL
Hgb urine dipstick: NEGATIVE
Ketones, ur: NEGATIVE mg/dL
Nitrite: NEGATIVE
Protein, ur: 30 mg/dL — AB
Specific Gravity, Urine: 1.013 (ref 1.005–1.030)
pH: 6 (ref 5.0–8.0)

## 2021-09-05 LAB — COMPREHENSIVE METABOLIC PANEL
ALT: 34 U/L (ref 0–44)
AST: 28 U/L (ref 15–41)
Albumin: 3.6 g/dL (ref 3.5–5.0)
Alkaline Phosphatase: 103 U/L (ref 38–126)
Anion gap: 8 (ref 5–15)
BUN: 41 mg/dL — ABNORMAL HIGH (ref 8–23)
CO2: 23 mmol/L (ref 22–32)
Calcium: 7.7 mg/dL — ABNORMAL LOW (ref 8.9–10.3)
Chloride: 106 mmol/L (ref 98–111)
Creatinine, Ser: 4.46 mg/dL — ABNORMAL HIGH (ref 0.44–1.00)
GFR, Estimated: 10 mL/min — ABNORMAL LOW (ref >60)
Glucose, Bld: 161 mg/dL — ABNORMAL HIGH (ref 70–99)
Potassium: 3.8 mmol/L (ref 3.5–5.1)
Sodium: 137 mmol/L (ref 135–145)
Total Bilirubin: 0.5 mg/dL (ref 0.3–1.2)
Total Protein: 7.4 g/dL (ref 6.5–8.1)

## 2021-09-05 LAB — CBC
HCT: 36.4 % (ref 36.0–46.0)
Hemoglobin: 11.6 g/dL — ABNORMAL LOW (ref 12.0–15.0)
MCH: 29.7 pg (ref 26.0–34.0)
MCHC: 31.9 g/dL (ref 30.0–36.0)
MCV: 93.1 fL (ref 80.0–100.0)
Platelets: 154 10*3/uL (ref 150–400)
RBC: 3.91 MIL/uL (ref 3.87–5.11)
RDW: 14.1 % (ref 11.5–15.5)
WBC: 5.3 10*3/uL (ref 4.0–10.5)
nRBC: 0 % (ref 0.0–0.2)

## 2021-09-05 LAB — LIPASE, BLOOD: Lipase: 100 U/L — ABNORMAL HIGH (ref 11–51)

## 2021-09-05 MED ORDER — ONDANSETRON 4 MG PO TBDP: ORAL_TABLET | ORAL | 0 refills | Status: DC

## 2021-09-05 MED ORDER — ONDANSETRON HCL 4 MG/2ML IJ SOLN
4.0000 mg | Freq: Once | INTRAMUSCULAR | Status: AC
  Administered 2021-09-05: 4 mg via INTRAVENOUS
  Filled 2021-09-05: qty 2

## 2021-09-05 MED ORDER — HEPARIN SOD (PORK) LOCK FLUSH 100 UNIT/ML IV SOLN
500.0000 [IU] | Freq: Once | INTRAVENOUS | Status: AC
  Administered 2021-09-05: 500 [IU]
  Filled 2021-09-05: qty 5

## 2021-09-05 MED ORDER — SODIUM CHLORIDE 0.9 % IV BOLUS
1000.0000 mL | Freq: Once | INTRAVENOUS | Status: AC
  Administered 2021-09-05: 1000 mL via INTRAVENOUS

## 2021-09-05 NOTE — ED Triage Notes (Signed)
Pt reports diarrhea and emesis that began this morning. Pt denies blood in stool or vomit. Denies abdominal pain. Pt does peritoneal dialysis at home.  ?

## 2021-09-05 NOTE — ED Provider Notes (Signed)
?[facility] DEPT ?Provider Note ? ? ?CSN: [id number] ?Arrival date & time: 09/05/21  1108 ? ?  ? ?History ? ?Chief Complaint  ?Patient presents with  ? Diarrhea  ? Emesis  ? ? ?Charlene Perry is a 71 y.o. female. ? ?Patient complains of vomiting and diarrhea.  Patient is a peritoneal dialysis patient.  She does not have any pain ? ?The history is provided by the patient and medical records. No language interpreter was used.  ?Diarrhea ?Quality:  Semi-solid ?Severity:  Moderate ?Onset quality:  Sudden ?Timing:  Intermittent ?Progression:  Unchanged ?Relieved by:  Nothing ?Worsened by:  Nothing ?Ineffective treatments:  None tried ?Associated symptoms: vomiting   ?Associated symptoms: no abdominal pain and no headaches   ?Risk factors: no recent antibiotic use   ?Emesis ?Associated symptoms: diarrhea   ?Associated symptoms: no abdominal pain, no cough and no headaches   ? ?  ? ?Home Medications ?Prior to Admission medications   ?Medication Sig Start Date End Date Taking? Authorizing Provider  ?ondansetron (ZOFRAN-ODT) 4 MG disintegrating tablet '4mg'$  ODT q4 hours prn nausea/vomit 09/05/21  Yes Milton Ferguson, MD  ?aspirin 81 MG chewable tablet Chew by mouth. 02/15/03   [provider]  ?carvedilol (COREG) 25 MG tablet Take 25 mg by mouth 2 (two) times daily. 05/17/21   [provider]  ?cetirizine (ZYRTEC) 10 MG tablet Take 10 mg by mouth daily.    [provider]  ?Docusate Sodium (DSS) 100 MG CAPS Take 1 capsule by mouth 2 (two) times daily. 08/27/21   [name], MD  ?furosemide (LASIX) 20 MG tablet Take 1 tablet (20 mg total) by mouth daily. 08/27/21   [name], MD  ?gentamicin cream (GARAMYCIN) 0.1 % Apply topically. 03/29/20   [provider]  ?letrozole (FEMARA) 2.5 MG tablet Take 1 tablet (2.5 mg total) by mouth daily. 08/27/21   [name], MD  ?levothyroxine (SYNTHROID, LEVOTHROID) 125 MCG tablet Take 125 mcg by mouth daily before  breakfast.    [provider]  ?potassium chloride 20 MEQ TBCR Take 20 mEq by mouth daily. 08/27/21   [name], MD  ?rosuvastatin (CRESTOR) 40 MG tablet Take 40 mg by mouth at bedtime.     [provider]  ?   ? ?Allergies    ?Patient has no known allergies.   ? ?Review of Systems   ?Review of Systems  ?Constitutional:  Negative for appetite change and fatigue.  ?HENT:  Negative for congestion, ear discharge and sinus pressure.   ?Eyes:  Negative for discharge.  ?Respiratory:  Negative for cough.   ?Cardiovascular:  Negative for chest pain.  ?Gastrointestinal:  Positive for diarrhea and vomiting. Negative for abdominal pain.  ?Genitourinary:  Negative for frequency and hematuria.  ?Musculoskeletal:  Negative for back pain.  ?Skin:  Negative for rash.  ?Neurological:  Negative for seizures and headaches.  ?Psychiatric/Behavioral:  Negative for hallucinations.   ? ?Physical Exam ?Updated Vital Signs ?BP (!) 147/61   Pulse 100   Temp 98.9 ?F (37.2 ?C) (Oral)   Resp 16   SpO2 96%  ?Physical Exam ?Vitals and nursing note reviewed.  ?Constitutional:   ?   Appearance: She is well-developed.  ?HENT:  ?   Head: Normocephalic.  ?   Nose: Nose normal.  ?Eyes:  ?   General: No scleral icterus. ?   Conjunctiva/sclera: Conjunctivae normal.  ?Neck:  ?   Thyroid: No thyromegaly.  ?Cardiovascular:  ?   Rate and Rhythm:  Normal rate and regular rhythm.  ?   Heart sounds: No murmur heard. ?  No friction rub. No gallop.  ?Pulmonary:  ?   Breath sounds: No stridor. No wheezing or rales.  ?Chest:  ?   Chest wall: No tenderness.  ?Abdominal:  ?   General: There is no distension.  ?   Tenderness: There is no abdominal tenderness. There is no rebound.  ?Musculoskeletal:     ?   General: Normal range of motion.  ?   Cervical back: Neck supple.  ?Lymphadenopathy:  ?   Cervical: No cervical adenopathy.  ?Skin: ?   Findings: No erythema or rash.  ?Neurological:  ?   Mental Status: She is alert and oriented to person,  place, and time.  ?   Motor: No abnormal muscle tone.  ?   Coordination: Coordination normal.  ?Psychiatric:     ?   Behavior: Behavior normal.  ? ? ?ED Results / Procedures / Treatments   ?Labs ?(all labs ordered are listed, but only abnormal results are displayed) ?Labs Reviewed  ?LIPASE, BLOOD - Abnormal; Notable for the following components:  ?    Result Value  ? Lipase 100 (*)   ? All other components within normal limits  ?COMPREHENSIVE METABOLIC PANEL - Abnormal; Notable for the following components:  ? Glucose, Bld 161 (*)   ? BUN 41 (*)   ? Creatinine, Ser 4.46 (*)   ? Calcium 7.7 (*)   ? GFR, Estimated 10 (*)   ? All other components within normal limits  ?CBC - Abnormal; Notable for the following components:  ? Hemoglobin 11.6 (*)   ? All other components within normal limits  ?URINALYSIS, ROUTINE W REFLEX MICROSCOPIC - Abnormal; Notable for the following components:  ? Protein, ur 30 (*)   ? Leukocytes,Ua TRACE (*)   ? Bacteria, UA RARE (*)   ? All other components within normal limits  ? ? ?EKG ?None ? ?Radiology ?No results found. ? ?Procedures ?Procedures  ? ? ?Medications Ordered in ED ?Medications  ?heparin lock flush 100 unit/mL (has no administration in time range)  ?ondansetron (ZOFRAN) injection 4 mg (4 mg Intravenous Given 09/05/21 1249)  ?sodium chloride 0.9 % bolus 1,000 mL (1,000 mLs Intravenous New Bag/Given 09/05/21 1250)  ? ? ?ED Course/ Medical Decision Making/ A&P ?  ?                        ?Medical Decision Making ?Amount and/or Complexity of Data Reviewed ?Labs: ordered. ? ?Risk ?Prescription drug management. ? ?This patient presents to the ED for concern of vomiting and diarrhea, this involves an extensive number of treatment options, and is a complaint that carries with it a high risk of complications and morbidity.  The differential diagnosis includes gastroenteritis, peritonitis ? ? ?Co morbidities that complicate the patient evaluation ? ?Peritoneal dialysis ? ? ?Additional  history obtained: ? ?Additional history obtained from patient ?External records from outside source obtained and reviewed including hospital record ? ? ?Lab Tests: ? ?I Ordered, and personally interpreted labs.  The pertinent results include: CBC chemistries and lipase.  Patient has an elevated lipase of 100 she has chronic kidney disease with creatinine of 4.5 and mild anemia with hemoglobin 11.6 ? ? ?Imaging Studies ordered: ? ?No x-rays done ? ?Cardiac Monitoring: / EKG: ? ?The patient was maintained on a cardiac monitor.  I personally viewed and interpreted the cardiac monitored which showed an underlying rhythm  of: Normal sinus rhythm ? ? ?Consultations Obtained: ? ?I requested consultation with the GI,  and discussed lab and imaging findings as well as pertinent plan - they recommend: Recommending no imaging and follow-up as outpatient ? ? ?Problem List / ED Course / Critical interventions / Medication management ? ?Kidney disease and vomiting and diarrhea ?I ordered medication including normal saline for dehydration and Zofran for vomiting ?Reevaluation of the patient after these medicines showed that the patient improved ?I have reviewed the patients home medicines and have made adjustments as needed ? ? ?Social Determinants of Health: ? ?Peritoneal dialysis patient ? ? ?Test / Admission - Considered: ? ?CT abdomen ? ?Patient with gastroenteritis that has improved some with fluids and Zofran.  She still not having discomfort.  Her lipase was elevated.  I spoke with GI and they felt like imaging was not necessary right now.  Patient will be sent home with Zofran and follow-up with family doctor and have her lipase rechecked ? ? ? ? ? ? ? ?Final Clinical Impression(s) / ED Diagnoses ?Final diagnoses:  ?Gastroenteritis  ? ? ?Rx / DC Orders ?ED Discharge Orders   ? ?      Ordered  ?  ondansetron (ZOFRAN-ODT) 4 MG disintegrating tablet       ? 09/05/21 1440  ? ?  ?  ? ?  ? ? ?  ?Milton Ferguson, MD ?09/09/21  1143 ? ?

## 2021-09-05 NOTE — Discharge Instructions (Signed)
Drink plenty of fluids.  Follow-up with your doctor next week for recheck.  You can use an occasional Imodium for diarrhea.  Return to the emergency department if you are not improving or getting worse.  Your family doctor can follow-up on your elevated pancreas enzyme next week ?

## 2021-09-16 ENCOUNTER — Ambulatory Visit (HOSPITAL_COMMUNITY)
Admission: RE | Admit: 2021-09-16 | Discharge: 2021-09-16 | Disposition: A | Discharge status: Home / Self Care | Payer: BC Managed Care – PPO | Source: Ambulatory Visit | Attending: Hematology and Oncology | Admitting: Hematology and Oncology

## 2021-09-16 ENCOUNTER — Ambulatory Visit (HOSPITAL_COMMUNITY): Payer: Medicare Other

## 2021-09-16 DIAGNOSIS — Z170 Estrogen receptor positive status [ER+]: Secondary | ICD-10-CM

## 2021-09-17 ENCOUNTER — Ambulatory Visit: Payer: BC Managed Care – PPO | Admitting: Rehabilitation

## 2021-09-23 ENCOUNTER — Ambulatory Visit: Payer: Medicare Other

## 2021-09-25 ENCOUNTER — Ambulatory Visit
Admission: RE | Admit: 2021-09-25 | Discharge: 2021-09-25 | Disposition: A | Discharge status: Home / Self Care | Payer: Medicare Other | Source: Ambulatory Visit | Attending: Family Medicine | Admitting: Family Medicine

## 2021-09-25 ENCOUNTER — Ambulatory Visit (HOSPITAL_COMMUNITY)
Admission: RE | Admit: 2021-09-25 | Discharge: 2021-09-25 | Disposition: A | Discharge status: Home / Self Care | Payer: BC Managed Care – PPO | Source: Ambulatory Visit | Attending: Hematology and Oncology | Admitting: Hematology and Oncology

## 2021-09-25 DIAGNOSIS — C50411 Malignant neoplasm of upper-outer quadrant of right female breast: Secondary | ICD-10-CM | POA: Diagnosis not present

## 2021-09-25 DIAGNOSIS — Z1231 Encounter for screening mammogram for malignant neoplasm of breast: Secondary | ICD-10-CM

## 2021-09-25 DIAGNOSIS — Z452 Encounter for adjustment and management of vascular access device: Secondary | ICD-10-CM | POA: Diagnosis present

## 2021-09-25 DIAGNOSIS — Z170 Estrogen receptor positive status [ER+]: Secondary | ICD-10-CM | POA: Insufficient documentation

## 2021-09-25 HISTORY — PX: IR REMOVAL TUN ACCESS W/ PORT W/O FL MOD SED: IMG2290

## 2021-09-25 MED ORDER — LIDOCAINE-EPINEPHRINE 1 %-1:100000 IJ SOLN
INTRAMUSCULAR | Status: DC | PRN
  Administered 2021-09-25: 10 mL via INTRADERMAL

## 2021-09-25 MED ORDER — LIDOCAINE-EPINEPHRINE 1 %-1:100000 IJ SOLN
INTRAMUSCULAR | Status: AC
  Filled 2021-09-25: qty 1

## 2021-10-01 ENCOUNTER — Ambulatory Visit: Payer: BC Managed Care – PPO | Admitting: Rehabilitation

## 2021-10-09 ENCOUNTER — Ambulatory Visit: Payer: BC Managed Care – PPO | Attending: Hematology and Oncology | Admitting: Rehabilitation

## 2021-10-09 ENCOUNTER — Encounter: Payer: Self-pay | Admitting: Rehabilitation

## 2021-10-09 DIAGNOSIS — I972 Postmastectomy lymphedema syndrome: Secondary | ICD-10-CM | POA: Insufficient documentation

## 2021-10-09 DIAGNOSIS — Z483 Aftercare following surgery for neoplasm: Secondary | ICD-10-CM | POA: Insufficient documentation

## 2021-10-09 DIAGNOSIS — M25611 Stiffness of right shoulder, not elsewhere classified: Secondary | ICD-10-CM | POA: Insufficient documentation

## 2021-10-09 DIAGNOSIS — M6281 Muscle weakness (generalized): Secondary | ICD-10-CM | POA: Diagnosis present

## 2021-10-09 NOTE — Therapy (Signed)
?[facility] @ [facility] ?[place], [place], [address] ?Phone: [phone]   Fax:  [phone] ? ?Physical Therapy Treatment ? ?Patient Details  ?Name: Charlene Charlene ?MRN: [id number] ?Date of Birth: 1951-04-05 ?Referring Provider (PT): Charlene Charlene ? ? ?Encounter Date: 10/09/2021 ? ? PT End of Session - 10/09/21 1105   ? ? Visit Number 7   ? Number of Visits 24   ? PT Start Time 1000   ? PT Stop Time 1015   ? PT Time Calculation (min) 15 min   ? Activity Tolerance Patient tolerated treatment well   ? Behavior During Therapy [facility] for tasks assessed/performed   ? ?  ?  ? ?  ? ? ?Past Medical History:  ?Diagnosis Date  ? Arthritis   ? Breast cancer in female [facility])   ? Right  ? [facility])   ? Diabetes mellitus without complication ([facility])   ? not on medication  ? Family history of breast cancer   ? Family history of colon cancer   ? Hypertension   ? takes fluid pills  ? Hypothyroidism   ? Pink eye disease of left eye   ? went to eye md and received drops - yesterday.  ? Renal disorder   ? Sleep apnea   ? back in the 1990's  ? Vitiligo   ? ? ?Past Surgical History:  ?Procedure Laterality Date  ? AV FISTULA PLACEMENT Left 02/21/2020  ? Procedure: CREATION of LEFT ARM BRACHIOCEPHALIC FISTULA;  Surgeon: Charlene Mould, MD;  Location: [facility];  Service: Vascular;  Laterality: Left;  ? AXILLARY LYMPH NODE DISSECTION Right 07/13/2018  ? Procedure: RIGHT AXILLARY LYMPH NODE DISSECTION;  Surgeon: Charlene Bookbinder, MD;  Location: [facility];  Service: General;  Laterality: Right;  ? BREAST BIOPSY Bilateral 01/2019  ? BREAST SURGERY    ? left breast fibro adenoma  ? CATARACT EXTRACTION Right 2019  ? CERVICAL FUSION N/A   ? C5 &6  ? CESAREAN SECTION    ? CHOLECYSTECTOMY  2004-2005  ? CYSTECTOMY    ? patient denies  ? EYE SURGERY    ? IR FLUORO GUIDE CV LINE RIGHT  09/07/2019  ? IR IMAGING GUIDED PORT INSERTION  07/29/2018  ? IR REMOVAL TUN ACCESS W/ PORT W/O FL MOD SED   09/25/2021  ? IR US GUIDE VASC ACCESS RIGHT  09/07/2019  ? MASTECTOMY Right 06/21/2018  ? MASTECTOMY WITH RADIOACTIVE SEED GUIDED EXCISION AND AXILLARY SENTINEL LYMPH NODE BIOPSY Right 06/21/2018  ? MASTECTOMY WITH RADIOACTIVE SEED GUIDED EXCISION AND AXILLARY SENTINEL LYMPH NODE BIOPSY Right 06/21/2018  ? Procedure: RIGHT TOTAL MASTECTOMY WITH RIGHT AXILLARY SENTINEL NODE BIOPSY AND RIGHT SEED GUIDED NODE EXCISION;  Surgeon: Charlene Bookbinder, MD;  Location: [facility];  Service: General;  Laterality: Right;  ? MYOMECTOMY  1986  ? PARTIAL HYSTERECTOMY  1995  ? PILONIDAL CYST EXCISION    ? PORTACATH PLACEMENT N/A 07/13/2018  ? Procedure: INSERTION PORT-A-CATH WITH ULTRASOUND;  Surgeon: Charlene Bookbinder, MD;  Location: [facility];  Service: General;  Laterality: N/A;  ? TONSILLECTOMY  2002  ? UVULOPALATOPHARYNGOPLASTY, TONSILLECTOMY AND SEPTOPLASTY    ? ? ?There were no vitals filed for this visit. ? ? Subjective Assessment - 10/09/21 1005   ? ? Subjective I was doing really good.  then it kind of creeped back.   ? Pertinent History right breast cancer in 2019  with mastectomy in 2020  with lymph node removal and had to  have a second surgery for more lymph node removal with chemo and radiation. Did fine til end of October 2022 with only  intermittent swelling but now pretty much stays all the time.  She has a sleeve and glove that was refitted as she has lost weight.  and was mostly wearing the glove  but not all the time.  Past history includes end stage renal disease that was discovered in March of 2021 with end stage kidney disease She is on periotoneal disease 5 nights a week.she has a port on the right side of abdomen that she ( Charlene[facility]  [facility] at [employer]) She has a fistula in left arm. She lives 45 minutes away from here. Past history includes widespread vitiligo   ? Currently in Pain? No/denies   ? ?  ?  ? ?  ? ? ? ? ? ? ? ? LYMPHEDEMA/ONCOLOGY QUESTIONNAIRE - 10/09/21 0001   ? ?  ? Right Upper Extremity Lymphedema   ? 15 cm Proximal to Olecranon Process 36.5 cm   ? 10 cm Proximal to Olecranon Process 36.1 cm   ? Olecranon Process 28.8 cm   ? 15 cm Proximal to Ulnar Styloid Process 28.7 cm   ? 10 cm Proximal to Ulnar Styloid Process 25.5 cm   ? Just Proximal to Ulnar Styloid Process 17.5 cm   ? [facility] [employer] [facility] [employer] 19.7 cm   ? At [facility] 2nd Digit 7.2 cm   ? ?  ?  ? ?  ? ? ? ? ? ? ? ? ? ? ? ? ? ? ? ? ? ? ? ? ? ? ? ? PT Short Term Goals - 05/30/21 1450   ? ?  ? PT SHORT TERM GOAL #1  ? Title Pt and husband will be able to perfom manual lymph drainage   ? Time 6   ? Period Weeks   ? Status New   ?  ? PT SHORT TERM GOAL #2  ? Title Pt will verbalize importance of elevation and exercise for managment of lymphedema   ? Time 6   ? Period Weeks   ? Status New   ?  ? PT SHORT TERM GOAL #3  ? Title Pt and husband will be able to apply compression bandaging to help with managment of lymphedema   ? Time 6   ? Period Weeks   ? Status New   ? ?  ?  ? ?  ? ? ? ? PT Long Term Goals - 05/30/21 1448   ? ?  ? PT LONG TERM GOAL #1  ? Title Pt and husband will report they can  manage her lymphedema at home   ? Time 10   ? Period Weeks   ? Status New   ?  ? PT LONG TERM GOAL #2  ? Title Pt will know how to get the compression garments that she needs for management at home   ? Time 10   ? Period Weeks   ? Status New   ?  ? PT LONG TERM GOAL #3  ? Title Pt will have 125 degrees of painfree right shoulder abduction so that she can perform her ADls easier   ? Time 10   ? Period Weeks   ? Status New   ? ?  ?  ? ?  ? ? ? ? ? ? ? ? ? ?Patient will benefit from skilled therapeutic intervention in order to improve the  following deficits and impairments:    ? ?Visit Diagnosis: ?Postmastectomy lymphedema ? ?Stiffness of right shoulder, not elsewhere classified ? ?Muscle weakness (generalized) ? ?Aftercare following surgery for neoplasm ? ? ? ? ?Problem List ?Patient Active Problem List  ? Diagnosis Date Noted  ? Microproteinuria 01/11/2020  ?  Allergy, unspecified, initial encounter 01/04/2020  ? Anaphylactic shock, unspecified, initial encounter 01/04/2020  ? Anemia in chronic kidney disease 12/05/2019  ? Hypokalemia 12/05/2019  ? ESRD (end stage renal disease) on dialysis ([place]) 10/03/2019  ? Moderate protein-calorie malnutrition ([place]) 09/15/2019  ? Anemia, unspecified 09/09/2019  ? Coagulation defect, unspecified ([place]) 09/09/2019  ? Diarrhea, unspecified 09/09/2019  ? Dyspnea, unspecified 09/09/2019  ? Essential (primary) hypertension 09/09/2019  ? Iron deficiency anemia, unspecified 09/09/2019  ? Obstructive sleep apnea 09/09/2019  ? Pain, unspecified 09/09/2019  ? Pruritus, unspecified 09/09/2019  ? Secondary hyperparathyroidism of renal origin ([place]) 09/09/2019  ? Other acute kidney failure ([place]) 09/06/2019  ? Diabetes mellitus type II, non insulin dependent ([place]) 09/03/2019  ? Acute pyelonephritis   ? Acute kidney injury ([place]) 09/02/2019  ? History of therapeutic radiation 04/06/2019  ? Back pain with radiation 09/13/2018  ? Port-A-Cath in place 08/24/2018  ? Breast cancer, right ([place]) 06/21/2018  ? Genetic testing 01/14/2018  ? Family history of colon cancer   ? Family history of breast cancer   ? Malignant neoplasm of upper-outer quadrant of right breast in female, estrogen receptor positive ([place]) 12/15/2017  ? Plantar fasciitis, right 09/30/2017  ? Hyperlipemia 09/26/2011  ? Hypothyroidism 08/25/2002  ? Vitiligo 01/26/2002  ? ? ?Stark Bray, PT ?10/09/2021, 11:06 AM ? ?[facility] ?[place], [place], [address] ?Phone: [phone]   Fax:  [phone] ? ?Name: Charlene [facility]ene [facility] ?MRN: [id number] ?Date of Birth: 13-Jan-1951 ? ? ? ?

## 2021-11-29 ENCOUNTER — Ambulatory Visit: Payer: BC Managed Care – PPO | Admitting: Rehabilitation

## 2021-12-03 ENCOUNTER — Ambulatory Visit: Payer: BC Managed Care – PPO | Attending: Hematology and Oncology | Admitting: Rehabilitation

## 2021-12-03 ENCOUNTER — Encounter: Payer: Self-pay | Admitting: Rehabilitation

## 2021-12-03 DIAGNOSIS — M25611 Stiffness of right shoulder, not elsewhere classified: Secondary | ICD-10-CM | POA: Insufficient documentation

## 2021-12-03 DIAGNOSIS — M6281 Muscle weakness (generalized): Secondary | ICD-10-CM | POA: Diagnosis present

## 2021-12-03 DIAGNOSIS — I972 Postmastectomy lymphedema syndrome: Secondary | ICD-10-CM | POA: Diagnosis present

## 2021-12-03 DIAGNOSIS — Z483 Aftercare following surgery for neoplasm: Secondary | ICD-10-CM | POA: Insufficient documentation

## 2021-12-03 NOTE — Therapy (Addendum)
[facility] [place], [place], [address] Phone: [phone]   Fax:  [phone]  Physical Therapy Treatment  Patient Details  Name: Charlene Perry MRN: [id number] Date of Birth: Oct 31, 1950 Referring Provider (PT): Dr Lindi Adie   Encounter Date: 12/03/2021   PT End of Session - 12/03/21 1502     Visit Number 8    Number of Visits 24    PT Start Time 1504    PT Stop Time 1524    PT Time Calculation (min) 20 min    Activity Tolerance Patient tolerated treatment well    Behavior During Therapy [facility] for tasks assessed/performed             Past Medical History:  Diagnosis Date   Arthritis    Breast cancer in female [facility])    Right   Cancer ([place])    Diabetes mellitus without complication ([place])    not on medication   Family history of breast cancer    Family history of colon cancer    Hypertension    takes fluid pills   Hypothyroidism    Pink eye disease of left eye    went to eye md and received drops - yesterday.   Renal disorder    Sleep apnea    back in the 1990's   Vitiligo     Past Surgical History:  Procedure Laterality Date   AV FISTULA PLACEMENT Left 02/21/2020   Procedure: CREATION of LEFT ARM BRACHIOCEPHALIC FISTULA;  Surgeon: Angelia Mould, MD;  Location: [facility];  Service: Vascular;  Laterality: Left;   AXILLARY LYMPH NODE DISSECTION Right 07/13/2018   Procedure: RIGHT AXILLARY LYMPH NODE DISSECTION;  Surgeon: [name], MD;  Location: [facility];  Service: General;  Laterality: Right;   BREAST BIOPSY Bilateral 01/2019   BREAST SURGERY     left breast fibro adenoma   CATARACT EXTRACTION Right 2019   CERVICAL FUSION N/A    C5 &6   CESAREAN SECTION     CHOLECYSTECTOMY  2004-2005   CYSTECTOMY     patient denies   EYE SURGERY     IR FLUORO GUIDE CV LINE RIGHT  09/07/2019   IR IMAGING GUIDED PORT INSERTION  07/29/2018   IR REMOVAL TUN ACCESS W/ PORT W/O FL MOD SED   09/25/2021   IR US GUIDE VASC ACCESS RIGHT  09/07/2019   MASTECTOMY Right 06/21/2018   MASTECTOMY WITH RADIOACTIVE SEED GUIDED EXCISION AND AXILLARY SENTINEL LYMPH NODE BIOPSY Right 06/21/2018   MASTECTOMY WITH RADIOACTIVE SEED GUIDED EXCISION AND AXILLARY SENTINEL LYMPH NODE BIOPSY Right 06/21/2018   Procedure: RIGHT TOTAL MASTECTOMY WITH RIGHT AXILLARY SENTINEL NODE BIOPSY AND RIGHT SEED GUIDED NODE EXCISION;  Surgeon: [name], MD;  Location: [facility];  Service: General;  Laterality: Right;   [place] N/A 07/13/2018   Procedure: INSERTION PORT-A-CATH WITH ULTRASOUND;  Surgeon: [name], MD;  Location: [facility];  Service: General;  Laterality: N/A;   TONSILLECTOMY  2002   UVULOPALATOPHARYNGOPLASTY, TONSILLECTOMY AND SEPTOPLASTY      There were no vitals filed for this visit.   Subjective Assessment - 12/03/21 1548     Subjective I am having an issue with the gauntlet. There is a seam thing at the thumb that gives me a blister    Pertinent History right breast cancer in 2019  with mastectomy in  2020  with lymph node removal and had to have a second surgery for more lymph node removal with chemo and radiation. Did fine til end of October 2022 with only  intermittent swelling but now pretty much stays all the time.  She has a sleeve and glove that was refitted as she has lost weight.  and was mostly wearing the glove  but not all the time.  Past history includes end stage renal disease that was discovered in March of 2021 with end stage kidney disease She is on periotoneal disease 5 nights a week.she has a port on the right side of abdomen that she ( Dr.Joseph  Coladonato at [employer]) She has a fistula in left arm. She lives 45 minutes away from here. Past history includes widespread vitiligo    Currently in Pain? No/denies                               [facility] Adult PT  Treatment/Exercise - 12/03/21 0001       Self-Care   Other Self-Care Comments  pt as a small healed blister/wound at the thumb web space so a photo was taken of the spot, the seam knot on the tumb of the gauntlet and the code on the gauntlet and all were sent to [facility] via email.  Pt declines need for further treatment just a new gauntlet.                       PT Short Term Goals - 05/30/21 1450       PT SHORT TERM GOAL #1   Title Pt and husband will be able to perfom manual lymph drainage    Time 6    Period Weeks    Status New      PT SHORT TERM GOAL #2   Title Pt will verbalize importance of elevation and exercise for managment of lymphedema    Time 6    Period Weeks    Status New      PT SHORT TERM GOAL #3   Title Pt and husband will be able to apply compression bandaging to help with managment of lymphedema    Time 6    Period Weeks    Status New               PT Long Term Goals - 05/30/21 1448       PT LONG TERM GOAL #1   Title Pt and husband will report they can  manage her lymphedema at home    Time 10    Period Weeks    Status New      PT LONG TERM GOAL #2   Title Pt will know how to get the compression garments that she needs for management at home    Time 10    Period Weeks    Status New      PT LONG TERM GOAL #3   Title Pt will have 125 degrees of painfree right shoulder abduction so that she can perform her ADls easier    Time 10    Period Weeks    Status New                   Plan - 12/03/21 1550     Clinical Impression Statement Pt likes her new sleeve and gauntlet but is having trouble with what seems like a manufacturers defect at  the thumb.  Should get a replacement sent with no issue.    PT Frequency Biweekly    PT Duration 8 weeks    PT Treatment/Interventions ADLs/Self Care Home Management;Therapeutic exercise;Taping;Passive range of motion;Patient/family education;Orthotic Fit/Training;Manual lymph  drainage;Compression bandaging;Manual techniques;Therapeutic activities    PT Next Visit Plan as needed             Patient will benefit from skilled therapeutic intervention in order to improve the following deficits and impairments:     Visit Diagnosis: Postmastectomy lymphedema  Stiffness of right shoulder, not elsewhere classified  Muscle weakness (generalized)  Aftercare following surgery for neoplasm     Problem List Patient Active Problem List   Diagnosis Date Noted   Microproteinuria 01/11/2020   Allergy, unspecified, initial encounter 01/04/2020   Anaphylactic shock, unspecified, initial encounter 01/04/2020   Anemia in chronic kidney disease 12/05/2019   Hypokalemia 12/05/2019   ESRD (end stage renal disease) on dialysis ([place]) 10/03/2019   Moderate protein-calorie malnutrition ([place]) 09/15/2019   Anemia, unspecified 09/09/2019   Coagulation defect, unspecified ([place]) 09/09/2019   Diarrhea, unspecified 09/09/2019   Dyspnea, unspecified 09/09/2019   Essential (primary) hypertension 09/09/2019   Iron deficiency anemia, unspecified 09/09/2019   Obstructive sleep apnea 09/09/2019   Pain, unspecified 09/09/2019   Pruritus, unspecified 09/09/2019   Secondary hyperparathyroidism of renal origin ([place]) 09/09/2019   Other acute kidney failure ([place]) 09/06/2019   Diabetes mellitus type II, non insulin dependent ([place]) 09/03/2019   Acute pyelonephritis    Acute kidney injury ([name]) 09/02/2019   History of therapeutic radiation 04/06/2019   Back pain with radiation 09/13/2018   Port-A-Cath in place 08/24/2018   Breast cancer, right ([place]) 06/21/2018   Genetic testing 01/14/2018   Family history of colon cancer    Family history of breast cancer    Malignant neoplasm of upper-outer quadrant of right breast in female, estrogen receptor positive ([place]) 12/15/2017   Plantar fasciitis, right 09/30/2017   Hyperlipemia 09/26/2011   Hypothyroidism 08/25/2002   Vitiligo  01/26/2002    Stark Bray, PT 12/03/2021, 3:52 PM  [facility] @ [place], [place], [address] Phone: [phone]   Fax:  [phone]  Name: Charlene Perry MRN: [id number] Date of Birth: 1951-01-25   PHYSICAL THERAPY DISCHARGE SUMMARY  Visits from Start of Care: 8  Current functional level related to goals / functional outcomes: See above   Remaining deficits: Chronic lymphedema   Education / Equipment: garments  Plan: Patient agrees to discharge.

## 2021-12-14 ENCOUNTER — Other Ambulatory Visit: Payer: Self-pay | Admitting: Nurse Practitioner

## 2022-04-17 ENCOUNTER — Encounter: Payer: Self-pay | Admitting: Pulmonary Disease

## 2022-04-17 ENCOUNTER — Ambulatory Visit (INDEPENDENT_AMBULATORY_CARE_PROVIDER_SITE_OTHER): Payer: BC Managed Care – PPO | Admitting: Pulmonary Disease

## 2022-04-17 VITALS — BP 130/68 | HR 76 | Temp 98.4°F | Ht 66.0 in | Wt 219.6 lb

## 2022-04-17 DIAGNOSIS — G4733 Obstructive sleep apnea (adult) (pediatric): Secondary | ICD-10-CM | POA: Diagnosis not present

## 2022-04-17 NOTE — Progress Notes (Signed)
   Subjective:    Patient ID: Charlene Perry, female    DOB: 1950/10/24, 71 y.o.   MRN: [id number]  HPI  71 yo for FU of OSA   OSA diagnosed in 2002 s/p UPPP, was on CPAP 11 cm, stopped in 2018   PMH - ESRD on PD Breast CA 2019  Chief Complaint  Patient presents with   Follow-up    Needs new cpap supplies. Has not been using cpap regularly because of dialysis, she feels like she is hooked up to too much when doing dialysis at home and trying to use cpap at the same time.   Refused flu vax for today    Annual follow-up visit. She has stopped PD over the last 2 weeks, is making some urine and creatinine clearance has improved.  Closely followed by nephrology.  Previously felt not to be a candidate for renal transplant due to history of breast cancer. She needs supplies DME has not provided her any.  She admits to poor compliance since she was using PD on a regular basis and did not want to be hooked onto 2 machines at 1 time. She admits that she sleeps better on nights when she uses the machine.  Her husband also rests better when she uses CPAP. She has gained 10 pounds to her current weight of '2 1 9 '$ pounds since her last visit  Significant tests/ events reviewed  HST 11/2019 >> (196 lbs ) mild OSA with AHI 13/ hr HST 11/2018 AHI 0.6/h   Review of Systems Pt denies any significant  nasal congestion or excess secretions, fever, chills, sweats, unintended wt loss, pleuritic or exertional cp, orthopnea pnd or leg swelling.  Pt also denies any obvious fluctuation in symptoms with weather or environmental change or other alleviating or aggravating factors.    Pt denies any increase in rescue therapy over baseline, denies waking up needing it or having early am exacerbations or coughing/wheezing/ or dyspnea      Objective:   Physical Exam  Gen. Pleasant, obese, in no distress ENT - no lesions, no post nasal drip Neck: No JVD, no thyromegaly, no carotid bruits Lungs: no use of  accessory muscles, no dullness to percussion, decreased without rales or rhonchi  Cardiovascular: Rhythm regular, heart sounds  normal, no murmurs or gallops, no peripheral edema Musculoskeletal: No deformities, no cyanosis or clubbing , no tremors       Assessment & Plan:

## 2022-04-17 NOTE — Assessment & Plan Note (Signed)
CPAP download shows average pressure of 14.5 cm on auto settings 5 to 15 cm. She reports subjective benefit from using her CPAP machine We discussed alternative of oral appliance but given her weight fluctuation, CPAP is likely the best option especially because she has been able to tolerate this well in the past  Weight loss encouraged, compliance with goal of at least 4-6 hrs every night is the expectation. Advised against medications with sedative side effects Cautioned against driving when sleepy - understanding that sleepiness will vary on a day to day basis

## 2022-04-17 NOTE — Patient Instructions (Signed)
X CPAP supplies will be renewed

## 2022-05-21 ENCOUNTER — Telehealth: Payer: Self-pay | Admitting: Pulmonary Disease

## 2022-05-21 NOTE — Telephone Encounter (Signed)
We already ordered CPAP supplies and order confirmation 04/17/22  I spoke with the pt and told her this  She states they told her that nothing was received  She is requesting that we send to them fax number [phone] Routing to [facility] urgent

## 2022-05-22 NOTE — Telephone Encounter (Signed)
I have faxed this order to both fax #'s [phone] and [phone]

## 2022-05-26 NOTE — Telephone Encounter (Signed)
I spoke with Kayla at [facility] and she confirmed that they do have the renewal of cpap supplies order

## 2022-05-27 ENCOUNTER — Encounter: Payer: Self-pay | Admitting: Hematology and Oncology

## 2022-08-08 ENCOUNTER — Encounter: Payer: Self-pay | Admitting: Hematology and Oncology

## 2022-08-26 ENCOUNTER — Other Ambulatory Visit: Payer: Self-pay | Admitting: Family Medicine

## 2022-08-26 DIAGNOSIS — Z1231 Encounter for screening mammogram for malignant neoplasm of breast: Secondary | ICD-10-CM

## 2022-08-26 NOTE — Progress Notes (Signed)
Patient Care Team: [name], MD as PCP - General (Family Medicine) [name], MD as Consulting Physician (Hematology and Oncology) [name], MD as Consulting Physician (Radiation Oncology) [name], MD as Consulting Physician (General Surgery) [facility]  DIAGNOSIS:  Encounter Diagnosis  Name Primary?   Malignant neoplasm of upper-outer quadrant of right breast in female, estrogen receptor positive ([place]) Yes    SUMMARY OF ONCOLOGIC HISTORY: Oncology History  Malignant neoplasm of upper-outer quadrant of right breast in female, estrogen receptor positive ([place])  11/12/2017 Initial Diagnosis   Palpable lesion in the right breast upper outer quadrant mammogram and ultrasound revealed 1.4 cm tumor ultrasound-guided biopsy revealed grade 2 IDC ER 3+, PR 3+, HER-2 negative with a Ki-67 of 10%.  Right axillary lymph node biopsy positive, T1cN1 stage I a clinical stage   12/23/2017 Cancer Staging   Staging form: Breast, AJCC 8th Edition - Clinical: Stage IB (cT1c, cN1, cM0, G2, ER+, PR+, HER2-) - Signed by Gardenia Phlegm, NP on 12/23/2017   01/10/2018 Breast MRI   Biopsy-proven malignancy UOQ right breast measuring 6 cm.  Anterior inferior to this is a 1 cm mass which needs biopsy.  Focal non-mass enhancement LOQ retroareolar right breast, left breast 7 mm irregular enhancement, 2 suspicious right axillary lymph nodes   01/27/2018 Procedure   Right breast biopsy UOQ: Grade 1 ILC, ALH Right breast biopsy 6:00: LCIS Left breast biopsy: Benign   06/21/2018 Surgery   Rt Mastectomy: 7 cm fibrotic mass ILC Grade 2, 5/6 LN Positive, ER 90-100%, PR 5%-70%, Ki 67 2-10%, Her 2 Neg, T3N2   07/13/2018 Surgery   Right axillary lymph node dissection: 3/10 lymph nodes positive making total number of lymph nodes 8/16   08/10/2018 - 12/28/2018 Chemotherapy   Adjuvant chemotherapy with dose dense Adriamycin and Cytoxan x4 followed by Taxol weekly x12    01/24/2019 -  Radiation Therapy   Adjuvant radiation treatment   03/2019 -  Anti-estrogen oral therapy   Letrozole daily     CHIEF COMPLIANT: Follow-up of right breast cancer on letrozole    INTERVAL HISTORY: Charlene Perry is a 72 y.o. with above-mentioned history of right breast cancer who underwent a right mastectomy, adjuvant chemotherapy, radiation, and is currently on antiestrogen therapy with letrozole.  She is tolerating letrozole extremely well without any problems or concerns.  Denies any hot flashes or arthralgias or myalgias.  She is super excited to tell me that she is currently off dialysis.  She is able to urinate by herself.   ALLERGIES:  has No Known Allergies.  MEDICATIONS:  Current Outpatient Medications  Medication Sig Dispense Refill   aspirin 81 MG chewable tablet Chew by mouth.     carvedilol (COREG) 25 MG tablet Take 25 mg by mouth 2 (two) times daily.     cetirizine (ZYRTEC) 10 MG tablet Take 10 mg by mouth daily.     Docusate Sodium (DSS) 100 MG CAPS Take 1 capsule by mouth 2 (two) times daily. 60 capsule    furosemide (LASIX) 20 MG tablet Take 1 tablet (20 mg total) by mouth daily. 30 tablet    gentamicin cream (GARAMYCIN) 0.1 % Apply topically.     letrozole (FEMARA) 2.5 MG tablet Take 1 tablet (2.5 mg total) by mouth daily. 90 tablet 3   levothyroxine (SYNTHROID, LEVOTHROID) 125 MCG tablet Take 125 mcg by mouth daily before breakfast.     potassium chloride 20 MEQ TBCR Take 20 mEq by mouth daily.  rosuvastatin (CRESTOR) 40 MG tablet Take 40 mg by mouth at bedtime.      No current facility-administered medications for this visit.    PHYSICAL EXAMINATION: ECOG PERFORMANCE STATUS: 1 - Symptomatic but completely ambulatory  Vitals:   09/01/22 1347  BP: (!) 175/69  Pulse: 80  Resp: 16  Temp: 97.9 F (36.6 C)  SpO2: 97%   Filed Weights   09/01/22 1347  Weight: 218 lb (98.9 kg)    BREAST: No palpable masses or nodules in either right  or left breasts. No palpable axillary supraclavicular or infraclavicular adenopathy no breast tenderness or nipple discharge. (exam performed in the presence of a chaperone)  LABORATORY DATA:  I have reviewed the data as listed    Latest Ref Rng & Units 09/05/2021   11:54 AM 02/21/2020    6:13 AM 09/09/2019    3:34 AM  CMP  Glucose 70 - 99 mg/dL 161  134  93   BUN 8 - 23 mg/dL 41  28  14   Creatinine 0.44 - 1.00 mg/dL 4.46  4.90  5.07   Sodium 135 - 145 mmol/L 137  137  139   Potassium 3.5 - 5.1 mmol/L 3.8  4.1  4.0   Chloride 98 - 111 mmol/L 106  101  101   CO2 22 - 32 mmol/L 23   27   Calcium 8.9 - 10.3 mg/dL 7.7   8.2   Total Protein 6.5 - 8.1 g/dL 7.4     Total Bilirubin 0.3 - 1.2 mg/dL 0.5     Alkaline Phos 38 - 126 U/L 103     AST 15 - 41 U/L 28     ALT 0 - 44 U/L 34       Lab Results  Component Value Date   WBC 5.3 09/05/2021   HGB 11.6 (L) 09/05/2021   HCT 36.4 09/05/2021   MCV 93.1 09/05/2021   PLT 154 09/05/2021   NEUTROABS 7.0 09/03/2019    ASSESSMENT & PLAN:  Malignant neoplasm of upper-outer quadrant of right breast in female, estrogen receptor positive ([place]) 06/21/17: Rt Mastectomy: 7 cm fibrotic mass ILC Grade 2, 5/6 LN Positive, ER 90-100%, PR 5%-70%, Ki 67 2-10%, Her 2 Neg, T3N2 Treatment plan: 1. Adj chemo with dose dense Adriamycin and Cytoxan followed by Taxol x12 (08/10/2018-12/28/2018) 2. Adj RT started 01/24/2019-03/07/2019 3. Adj Anti estrogen therapy   CT CAP: 07/02/2018: Subtle hypoattenuating lesion upper pole left kidney could be a cyst, urology consulted, left adrenal adenoma. --------------------------------------------------------------------------------------------------------------------- Current treatment: Adjuvant antiestrogen therapy with anastrozole 1 mg daily to start 03/17/2019 Anastrozole toxicities She is tolerating anastrozole without any problems or concerns.  Denies any hot flashes. Mild joint aches and pains.   End-stage kidney  disease: She has been off for dialysis since last year.  She is super excited and happy about that.   Breast cancer surveillance: 1.  Left breast mammogram scheduled for 10/14/2022   I encouraged her to exercise regularly.   Return to clinic in 1 year for follow-up    No orders of the defined types were placed in this encounter.  The patient has a good understanding of the overall plan. she agrees with it. she will call with any problems that may develop before the next visit here. Total time spent: 30 mins including face to face time and time spent for planning, charting and co-ordination of care   Harriette Ohara, MD 09/01/22    I Gardiner Coins am acting as  a scribe for Dr.Imanni Burdine  I have reviewed the above documentation for accuracy and completeness, and I agree with the above.

## 2022-09-01 ENCOUNTER — Other Ambulatory Visit: Payer: Self-pay

## 2022-09-01 ENCOUNTER — Inpatient Hospital Stay: Payer: Medicare Other | Attending: Hematology and Oncology | Admitting: Hematology and Oncology

## 2022-09-01 VITALS — BP 175/69 | HR 80 | Temp 97.9°F | Resp 16 | Wt 218.0 lb

## 2022-09-01 DIAGNOSIS — Z79811 Long term (current) use of aromatase inhibitors: Secondary | ICD-10-CM | POA: Insufficient documentation

## 2022-09-01 DIAGNOSIS — C50411 Malignant neoplasm of upper-outer quadrant of right female breast: Secondary | ICD-10-CM | POA: Diagnosis present

## 2022-09-01 DIAGNOSIS — Z79899 Other long term (current) drug therapy: Secondary | ICD-10-CM | POA: Insufficient documentation

## 2022-09-01 DIAGNOSIS — Z170 Estrogen receptor positive status [ER+]: Secondary | ICD-10-CM | POA: Diagnosis not present

## 2022-09-01 DIAGNOSIS — Z9011 Acquired absence of right breast and nipple: Secondary | ICD-10-CM | POA: Insufficient documentation

## 2022-09-01 MED ORDER — LETROZOLE 2.5 MG PO TABS: 2.5000 mg | ORAL_TABLET | Freq: Every day | ORAL | 3 refills | Status: DC

## 2022-09-01 NOTE — Assessment & Plan Note (Signed)
06/21/17: Rt Mastectomy: 7 cm fibrotic mass ILC Grade 2, 5/6 LN Positive, ER 90-100%, PR 5%-70%, Ki 67 2-10%, Her 2 Neg, T3N2 Treatment plan: 1. Adj chemo with dose dense Adriamycin and Cytoxan followed by Taxol x12 (08/10/2018-12/28/2018) 2. Adj RT started 01/24/2019-03/07/2019 3. Adj Anti estrogen therapy   CT CAP: 07/02/2018: Subtle hypoattenuating lesion upper pole left kidney could be a cyst, urology consulted, left adrenal adenoma. --------------------------------------------------------------------------------------------------------------------- Current treatment: Adjuvant antiestrogen therapy with anastrozole 1 mg daily to start 03/17/2019 Anastrozole toxicities She is tolerating anastrozole without any problems or concerns.  Denies any hot flashes. Mild joint aches and pains.   End-stage kidney disease: On peritoneal dialysis.  She is very happy with peritoneal dialysis since she now has the ability to travel and do things through the day.  She has a fistula on the left arm. I discussed with her extensively that from our standpoint, at this point, she can proceed with kidney transplant posttransplant immunosuppressive therapy would be acceptable and would not increase her risk of breast cancer recurrence.   Breast cancer surveillance: 1.  Left breast mammogram scheduled for 10/14/2022 2. breast exam 09/01/2022: Benign   Return to clinic in 1 year for follow-up

## 2022-09-02 NOTE — Therapy (Signed)
OUTPATIENT PHYSICAL THERAPY ONCOLOGY EVALUATION  Patient Name: Charlene Perry MRN: [id number] DOB:Dec 14, 1950, 72 y.o., female Today's Date: 09/03/2022  END OF SESSION:  PT End of Session - 09/03/22 1705     Visit Number 1    Number of Visits 7    Date for PT Re-Evaluation 10/29/22    PT Start Time 1100    PT Stop Time 1150    PT Time Calculation (min) 50 min    Activity Tolerance Patient tolerated treatment well    Behavior During Therapy WFL for tasks assessed/performed             Past Medical History:  Diagnosis Date   Arthritis    Breast cancer in female [facility])    Right   Cancer ([place])    Diabetes mellitus without complication ([place])    not on medication   Family history of breast cancer    Family history of colon cancer    Hypertension    takes fluid pills   Hypothyroidism    Pink eye disease of left eye    went to eye md and received drops - yesterday.   Renal disorder    Sleep apnea    back in the 1990's   Vitiligo    Past Surgical History:  Procedure Laterality Date   AV FISTULA PLACEMENT Left 02/21/2020   Procedure: CREATION of LEFT ARM BRACHIOCEPHALIC FISTULA;  Surgeon: Angelia Mould, MD;  Location: [place];  Service: Vascular;  Laterality: Left;   AXILLARY LYMPH NODE DISSECTION Right 07/13/2018   Procedure: RIGHT AXILLARY LYMPH NODE DISSECTION;  Surgeon: [name], MD;  Location: [place];  Service: General;  Laterality: Right;   BREAST BIOPSY Bilateral 01/2019   BREAST SURGERY     left breast fibro adenoma   CATARACT EXTRACTION Right 2019   CERVICAL FUSION N/A    C5 &6   CESAREAN SECTION     CHOLECYSTECTOMY  2004-2005   CYSTECTOMY     patient denies   EYE SURGERY     IR FLUORO GUIDE CV LINE RIGHT  09/07/2019   IR IMAGING GUIDED PORT INSERTION  07/29/2018   IR REMOVAL TUN ACCESS W/ PORT W/O FL MOD SED  09/25/2021   IR US GUIDE VASC ACCESS RIGHT  09/07/2019   MASTECTOMY Right 06/21/2018   MASTECTOMY WITH RADIOACTIVE SEED GUIDED  EXCISION AND AXILLARY SENTINEL LYMPH NODE BIOPSY Right 06/21/2018   MASTECTOMY WITH RADIOACTIVE SEED GUIDED EXCISION AND AXILLARY SENTINEL LYMPH NODE BIOPSY Right 06/21/2018   Procedure: RIGHT TOTAL MASTECTOMY WITH RIGHT AXILLARY SENTINEL NODE BIOPSY AND RIGHT SEED GUIDED NODE EXCISION;  Surgeon: [name], MD;  Location: [place];  Service: General;  Laterality: Right;   [place] N/A 07/13/2018   Procedure: INSERTION PORT-A-CATH WITH ULTRASOUND;  Surgeon: [name], MD;  Location: [place];  Service: General;  Laterality: N/A;   TONSILLECTOMY  2002   UVULOPALATOPHARYNGOPLASTY, TONSILLECTOMY AND SEPTOPLASTY     Patient Active Problem List   Diagnosis Date Noted   Microproteinuria 01/11/2020   Allergy, unspecified, initial encounter 01/04/2020   Anaphylactic shock, unspecified, initial encounter 01/04/2020   Anemia in chronic kidney disease 12/05/2019   Hypokalemia 12/05/2019   ESRD (end stage renal disease) on dialysis ([place]) 10/03/2019   Moderate protein-calorie malnutrition ([place]) 09/15/2019   Anemia, unspecified 09/09/2019   Coagulation defect, unspecified ([place]) 09/09/2019   Diarrhea, unspecified 09/09/2019  Dyspnea, unspecified 09/09/2019   Essential (primary) hypertension 09/09/2019   Iron deficiency anemia, unspecified 09/09/2019   Obstructive sleep apnea 09/09/2019   Pain, unspecified 09/09/2019   Pruritus, unspecified 09/09/2019   Secondary hyperparathyroidism of renal origin ([place]) 09/09/2019   Other acute kidney failure ([place]) 09/06/2019   Diabetes mellitus type II, non insulin dependent ([place]) 09/03/2019   Acute pyelonephritis    Acute kidney injury ([place]) 09/02/2019   History of therapeutic radiation 04/06/2019   Back pain with radiation 09/13/2018   Port-A-Cath in place 08/24/2018   Breast cancer, right ([place]) 06/21/2018   Genetic testing 01/14/2018   Family history of colon  cancer    Family history of breast cancer    Malignant neoplasm of upper-outer quadrant of right breast in female, estrogen receptor positive ([place]) 12/15/2017   Plantar fasciitis, right 09/30/2017   Hyperlipemia 09/26/2011   Hypothyroidism 08/25/2002   Vitiligo 01/26/2002    PCP: Dr. Lorne Skeens  REFERRING PROVIDER: Dr. Lindi Adie  REFERRING DIAG: C50.411,Z17.0 (ICD-10-CM) - Malignant neoplasm of upper-outer quadrant of right breast in female, estrogen receptor positive ([place])   THERAPY DIAG:  Postmastectomy lymphedema  Stiffness of right shoulder, not elsewhere classified  Muscle weakness (generalized)  Aftercare following surgery for neoplasm  ONSET DATE: 2019  Rationale for Evaluation and Treatment: Rehabilitation  SUBJECTIVE:                                                                                                                                                                                           SUBJECTIVE STATEMENT: I don't have to do dialysis anymore! But I still need a kidney transplant and I am on the list [facility].  The arm is swelling more.  PERTINENT HISTORY:  right breast cancer in 2019  with mastectomy in 2020  with lymph node removal and had to have a second surgery for more lymph node removal with chemo and radiation. Lymphedema started October 2022  Past history includes end stage renal disease. She is on periotoneal disease 5 nights a week.she has a port on the right side of abdomen ( Dr.Joseph  Coladonato at [facility]) She has a fistula in left arm. She lives 45 minutes away from here. Past history includes widespread vitiligo. Treated here previously    PAIN:  Are you having pain? No  PRECAUTIONS: end stage renal disease, chronic lymphedema  WEIGHT BEARING RESTRICTIONS: No  FALLS:  Has patient fallen in last 6 months? No  LIVING ENVIRONMENT: Lives with: lives with their family and lives with their spouse  PATIENT GOALS: I don't want it to  get    OBJECTIVE:  PALPATION: +1 pitting  forearm and elbow and no pitting upper arm.    OBSERVATIONS / OTHER ASSESSMENTS: Rt arm larger   LYMPHEDEMA ASSESSMENTS:   [facility] Left 10/10/22 09/03/22  Axilla   40.8  15 cm proximal 36.5 41  10 cm proximal to olecranon process 36.1 38.4  Olecranon process 28.8 32  15cm proximal 28.7 33.7  10 cm proximal to ulnar styloid process 25.5 29.3  Just proximal to ulnar styloid process 17.5 18.5  Across hand at thumb web space 19.7 20.6  At base of 2nd digit 7.2 7.5  (Blank rows = not tested) 8cm length   [place] LEFT  eval        10 cm proximal to olecranon process   Olecranon process      10 cm proximal to ulnar styloid process   Just proximal to ulnar styloid process   Across hand at thumb web space   At base of 2nd digit   (Blank rows = not tested)    TODAY'S TREATMENT:                                                                                                                                         DATE:  09/03/22 Eval performed Review of CDT options: pt would still not like to bandage but would be able to use her velcro more frequently.   Reviewed MLD with pt having learned this previously but not doing it.  5 diaphragmatic breaths, bil axillary nodes and establishment of interaxillary pathway, Rt inguinal nodes and establishment of axilloinguinal pathway, then Rt UE working proximal to distal, moving fluid from upper inner arm outwards, and doing both sides of forearm moving fluid towards pathways spending extra time in any areas of fibrosis then retracing all steps. Each step was read by PT and then demonstrated and then performed by Pt.   PATIENT EDUCATION:  Education details: POC, compression options Person educated: Patient and Spouse Education method: Explanation Education comprehension: verbalized understanding  HOME EXERCISE PROGRAM: Self MLD, use velcro  ASSESSMENT:  CLINICAL IMPRESSION: Patient is a 72  y.o. female who was seen today for physical therapy evaluation and treatment for her chronic Rt UE lymphedema which has worsened recently due to stopping dialysis.  Pt is very excited to have her kidneys working well enough to stop dialysis but this may have allowed for more fluid retention in the UE.  The UE is 3-4cm larger overall with pitting in the forearm and elbow region as well as the back of the hand.  We will start MLD visits 1x per week due to pt living in [place] with use of velcro and ordering of new garments.  Pt has been wearing exostrong sleeve and gauntlet and was given information on ordering a glove sooner rather than later due to new finger swelling.     OBJECTIVE IMPAIRMENTS: Edema, decreased UE mobility, at risk for infections  ACTIVITY  LIMITATIONS: lifting  PARTICIPATION LIMITATIONS: none  PERSONAL FACTORS: 1-2 comorbidities: Lymph node removal, end stage kidney disease  are also affecting patient's functional outcome.   REHAB POTENTIAL: Excellent  CLINICAL DECISION MAKING: Evolving/moderate complexity  EVALUATION COMPLEXITY: Low  GOALS: Goals reviewed with patient? Yes  SHORT TERM GOALS: Target date: 09/15/22  Pt will be ind with self MLD Baseline: Goal status: INITIAL  LONG TERM GOALS: Target date: 10/29/22  Pt will be measured for new long term compression garments Baseline:  Goal status: INITIAL  2.  Pt will decrease circumferences in the forearm by at least 2cm to decrease risk of infection Baseline:  Goal status: INITIAL   PLAN:  PT FREQUENCY: 1x/week  PT DURATION: 6 weeks  PLANNED INTERVENTIONS: Therapeutic exercises, Patient/Family education, Self Care, Manual therapy, and Re-evaluation  PLAN FOR NEXT SESSION: MLD and MLD review, use velcro, will order day and night as able custom vs another exostrong?   Stark Bray, PT 09/03/2022, 5:06 PM

## 2022-09-03 ENCOUNTER — Encounter: Payer: Self-pay | Admitting: Rehabilitation

## 2022-09-03 ENCOUNTER — Ambulatory Visit: Payer: Medicare Other | Attending: Hematology and Oncology | Admitting: Rehabilitation

## 2022-09-03 ENCOUNTER — Other Ambulatory Visit: Payer: Self-pay

## 2022-09-03 DIAGNOSIS — Z853 Personal history of malignant neoplasm of breast: Secondary | ICD-10-CM | POA: Diagnosis not present

## 2022-09-03 DIAGNOSIS — I972 Postmastectomy lymphedema syndrome: Secondary | ICD-10-CM | POA: Diagnosis present

## 2022-09-03 DIAGNOSIS — M25611 Stiffness of right shoulder, not elsewhere classified: Secondary | ICD-10-CM | POA: Insufficient documentation

## 2022-09-03 DIAGNOSIS — Z483 Aftercare following surgery for neoplasm: Secondary | ICD-10-CM | POA: Diagnosis present

## 2022-09-03 DIAGNOSIS — M6281 Muscle weakness (generalized): Secondary | ICD-10-CM | POA: Diagnosis not present

## 2022-09-03 NOTE — Patient Instructions (Signed)
Deep Effective Breath   Standing, sitting, or laying down, place both hands on the belly. Take a deep breath IN, expanding the belly; then breath OUT, contracting the belly. Repeat __5__ times. Do __2-3__ sessions per day and before your self massage.  http://gt2.exer.us/866   Copyright  VHI. All rights reserved.  Axilla to Axilla - Sweep   On both sides make 5 circles in the armpit,   then pump _5__ times from involved armpit across chest to uninvolved armpit, making a pathway.  (Right to left)    Copyright  VHI. All rights reserved.  Axilla to Inguinal Nodes - Sweep   On involved side, make 5 circles at groin at panty line,   then pump _5__ times from armpit along side of trunk to outer hip, making your other pathway. Do __1_ time per day.  Copyright  VHI. All rights reserved.  Arm Posterior: Elbow to Shoulder - Sweep   Pump _5__ times from back of elbow to top of shoulder.   Then inner to outer upper arm _5_ times, then outer arm again _5_ times. Then back to the pathways _2-3_ times. Do _1__ time per day.  Copyright  VHI. All rights reserved.  ARM: [place] Wrist to Elbow - Sweep   Pump or stationary circles _5__ times from wrist to elbow making sure to do both sides of the forearm.   Then retrace your steps to the outer arm, and the pathways _2-3_ times each. Do _1__ time per day.  Copyright  VHI. All rights reserved.  ARM: Dorsum of Hand to Shoulder - Sweep   Pump or stationary circles _5__ times on back of hand including knuckle spaces and individual fingers if needed working up towards the wrist,   then retrace all your steps working back up the forearm, doing both sides; upper outer arm and back to your pathways _2-3_ times each.   Then do 5 circles again at uninvolved armpit and involved groin where you started! Good job!! Do __1_ time per day.  Copyright  VHI. All rights reserved.

## 2022-09-16 ENCOUNTER — Encounter: Payer: Self-pay | Admitting: Rehabilitation

## 2022-09-16 ENCOUNTER — Ambulatory Visit: Payer: Medicare Other | Attending: Hematology and Oncology | Admitting: Rehabilitation

## 2022-09-16 DIAGNOSIS — Z483 Aftercare following surgery for neoplasm: Secondary | ICD-10-CM | POA: Diagnosis present

## 2022-09-16 DIAGNOSIS — M6281 Muscle weakness (generalized): Secondary | ICD-10-CM | POA: Insufficient documentation

## 2022-09-16 DIAGNOSIS — M25611 Stiffness of right shoulder, not elsewhere classified: Secondary | ICD-10-CM | POA: Diagnosis present

## 2022-09-16 DIAGNOSIS — I972 Postmastectomy lymphedema syndrome: Secondary | ICD-10-CM | POA: Diagnosis present

## 2022-09-16 NOTE — Therapy (Addendum)
OUTPATIENT PHYSICAL THERAPY ONCOLOGY TREATMENT  Patient Name: Charlene Perry MRN: [id number] DOB:24-Apr-1951, 72 y.o., female Today's Date: 09/16/2022  END OF SESSION:  PT End of Session - 09/16/22 1155     Visit Number 2    Number of Visits 7    Date for PT Re-Evaluation 10/29/22    PT Start Time 1200    PT Stop Time 1250    PT Time Calculation (min) 50 min    Activity Tolerance Patient tolerated treatment well    Behavior During Therapy WFL for tasks assessed/performed             Past Medical History:  Diagnosis Date   Arthritis    Breast cancer in female    Right   Cancer    Diabetes mellitus without complication    not on medication   Family history of breast cancer    Family history of colon cancer    Hypertension    takes fluid pills   Hypothyroidism    Pink eye disease of left eye    went to eye md and received drops - yesterday.   Renal disorder    Sleep apnea    back in the 1990's   Vitiligo    Past Surgical History:  Procedure Laterality Date   AV FISTULA PLACEMENT Left 02/21/2020   Procedure: CREATION of LEFT ARM BRACHIOCEPHALIC FISTULA;  Surgeon: Chuck Hint, MD;  Location: [facility] OR;  Service: Vascular;  Laterality: Left;   AXILLARY LYMPH NODE DISSECTION Right 07/13/2018   Procedure: RIGHT AXILLARY LYMPH NODE DISSECTION;  Surgeon: Emelia Loron, MD;  Location: [facility] OR;  Service: General;  Laterality: Right;   BREAST BIOPSY Bilateral 01/2019   BREAST SURGERY     left breast fibro adenoma   CATARACT EXTRACTION Right 2019   CERVICAL FUSION N/A    C5 &6   CESAREAN SECTION     CHOLECYSTECTOMY  2004-2005   CYSTECTOMY     patient denies   EYE SURGERY     IR FLUORO GUIDE CV LINE RIGHT  09/07/2019   IR IMAGING GUIDED PORT INSERTION  07/29/2018   IR REMOVAL TUN ACCESS W/ PORT W/O FL MOD SED  09/25/2021   IR US GUIDE VASC ACCESS RIGHT  09/07/2019   MASTECTOMY Right 06/21/2018   MASTECTOMY WITH RADIOACTIVE SEED GUIDED EXCISION AND AXILLARY  SENTINEL LYMPH NODE BIOPSY Right 06/21/2018   MASTECTOMY WITH RADIOACTIVE SEED GUIDED EXCISION AND AXILLARY SENTINEL LYMPH NODE BIOPSY Right 06/21/2018   Procedure: RIGHT TOTAL MASTECTOMY WITH RIGHT AXILLARY SENTINEL NODE BIOPSY AND RIGHT SEED GUIDED NODE EXCISION;  Surgeon: Emelia Loron, MD;  Location: [facility] OR;  Service: General;  Laterality: Right;   MYOMECTOMY  1986   PARTIAL HYSTERECTOMY  1995   PILONIDAL CYST EXCISION     PORTACATH PLACEMENT N/A 07/13/2018   Procedure: INSERTION PORT-A-CATH WITH ULTRASOUND;  Surgeon: Emelia Loron, MD;  Location: [facility] OR;  Service: General;  Laterality: N/A;   TONSILLECTOMY  2002   UVULOPALATOPHARYNGOPLASTY, TONSILLECTOMY AND SEPTOPLASTY     Patient Active Problem List   Diagnosis Date Noted   Microproteinuria 01/11/2020   Allergy, unspecified, initial encounter 01/04/2020   Anaphylactic shock, unspecified, initial encounter 01/04/2020   Anemia in chronic kidney disease 12/05/2019   Hypokalemia 12/05/2019   ESRD (end stage renal disease) on dialysis 10/03/2019   Moderate protein-calorie malnutrition 09/15/2019   Anemia, unspecified 09/09/2019   Coagulation defect, unspecified 09/09/2019   Diarrhea, unspecified 09/09/2019   Dyspnea, unspecified 09/09/2019   Essential (  primary) hypertension 09/09/2019   Iron deficiency anemia, unspecified 09/09/2019   Obstructive sleep apnea 09/09/2019   Pain, unspecified 09/09/2019   Pruritus, unspecified 09/09/2019   Secondary hyperparathyroidism of renal origin 09/09/2019   Other acute kidney failure 09/06/2019   Diabetes mellitus type II, non insulin dependent 09/03/2019   Acute pyelonephritis    Acute kidney injury 09/02/2019   History of therapeutic radiation 04/06/2019   Back pain with radiation 09/13/2018   Port-A-Cath in place 08/24/2018   Breast cancer, right 06/21/2018   Genetic testing 01/14/2018   Family history of colon cancer    Family history of breast cancer    Malignant neoplasm of  upper-outer quadrant of right breast in female, estrogen receptor positive 12/15/2017   Plantar fasciitis, right 09/30/2017   Hyperlipemia 09/26/2011   Hypothyroidism 08/25/2002   Vitiligo 01/26/2002    PCP: Dr. Lorne Skeens  REFERRING PROVIDER: Dr. Lindi Adie  REFERRING DIAG: C50.411,Z17.0 (ICD-10-CM) - Malignant neoplasm of upper-outer quadrant of right breast in female, estrogen receptor positive ([place])   THERAPY DIAG:  Postmastectomy lymphedema  Stiffness of right shoulder, not elsewhere classified  Muscle weakness (generalized)  Aftercare following surgery for neoplasm  ONSET DATE: 2019  Rationale for Evaluation and Treatment: Rehabilitation  SUBJECTIVE:                                                                                                                                                                                           SUBJECTIVE STATEMENT: I realized I have 2 velcros and already had a glove.   PERTINENT HISTORY:  right breast cancer in 2019  with mastectomy in 2020  with lymph node removal and had to have a second surgery for more lymph node removal with chemo and radiation. Lymphedema started October 2022  Past history includes end stage renal disease. she has a port on the right side of abdomen ( Dr.Joseph  Coladonato at [facility]) She has a fistula in left arm. She lives 45 minutes away from here. Past history includes widespread vitiligo. Treated here previously    PAIN:  Are you having pain? No  PRECAUTIONS: end stage renal disease, chronic lymphedema  WEIGHT BEARING RESTRICTIONS: No  FALLS:  Has patient fallen in last 6 months? No  LIVING ENVIRONMENT: Lives with: lives with their family and lives with their spouse  PATIENT GOALS: I don't want it to get    OBJECTIVE:  PALPATION: +1 pitting forearm and elbow and no pitting upper arm.    OBSERVATIONS / OTHER ASSESSMENTS: Rt arm larger   LYMPHEDEMA ASSESSMENTS:   [facility] Left 10/10/22  09/03/22 09/16/22  Axilla   40.8 40.5  15 cm proximal 36.5 41 41  10 cm proximal to olecranon process 36.1 38.4 39  Olecranon process 28.8 32 33  15cm proximal 28.7 33.7 31.4  10 cm proximal to ulnar styloid process 25.5 29.3 27.5  Just proximal to ulnar styloid process 17.5 18.5 19.4  Across hand at thumb web space 19.7 20.6 21.5  At base of 2nd digit 7.2 7.5 7.5  (Blank rows = not tested) 8cm length    TODAY'S TREATMENT:                                                                                                                                         DATE:  09/16/22 Discussed updated POC.  Discussed pt situation with other therapists and we decided that due to ESRD being a contraindication to MLD that we would focus on appropriate compression without MLD at this point and that if she gets off the transplant list or gets a transplant then we will revisit this.   Reviewed velcro garment application as the wrist looked loose compared to the upper arm.  Reviewed distal to proximal pressure.    09/03/22 Eval performed Review of CDT options: pt would still not like to bandage but would be able to use her velcro more frequently.   Reviewed MLD with pt having learned this previously but not doing it.  5 diaphragmatic breaths, bil axillary nodes and establishment of interaxillary pathway, Rt inguinal nodes and establishment of axilloinguinal pathway, then Rt UE working proximal to distal, moving fluid from upper inner arm outwards, and doing both sides of forearm moving fluid towards pathways spending extra time in any areas of fibrosis then retracing all steps. Each step was read by PT and then demonstrated and then performed by Pt.   PATIENT EDUCATION:  Education details: POC, compression options Person educated: Patient and Spouse Education method: Explanation Education comprehension: verbalized understanding  HOME EXERCISE PROGRAM: Self MLD, use velcro  ASSESSMENT:  CLINICAL  IMPRESSION: Remeasured UE which is down a bit from starting to wear her velcro.  We decided to update her POC to include compression only and not MLD due to ESRD and the need for a kidney transplant.  Pt is understanding of this.  Addendum 09/24/22: Pt was measured for a Medi 550 Class 2 hand piece and arm sleeve x 1 of each for the Rt UE and Rt glove with open fingers AC1.  Pt needed to get a custom sleeve due to not fitting in a ready to wear size chart due to arm size.  She also now requires a glove due to dorsal hand and finger edema.  She will need a 5cm beaded top band to assist with sleeve positioning and prevent slipping.  Pt will need an elbow flexure zone for improved mobility and decreased pressure at the elbow.  Pt has been using an old juxtafit essentials hand and arm garment  for day and night for a few years and needs to get a new velcro system for night wear, bandage alternative due to fluctuating edema due to ESRD and due to pt preference of night garment.  Pt and husband have not been able to bandage in the past.  She will also need a butler donning device to assist with garment donning.  She has limited opposite arm strength due to a permanent fistula from dialysis.      OBJECTIVE IMPAIRMENTS: Edema, decreased UE mobility, at risk for infections  ACTIVITY LIMITATIONS: lifting  PARTICIPATION LIMITATIONS: none  PERSONAL FACTORS: 1-2 comorbidities: Lymph node removal, end stage kidney disease  are also affecting patient's functional outcome.   REHAB POTENTIAL: Excellent  CLINICAL DECISION MAKING: Evolving/moderate complexity  EVALUATION COMPLEXITY: Low  GOALS: Goals reviewed with patient? Yes  SHORT TERM GOALS: Target date: 09/15/22  Pt will be ind with self MLD Baseline: Goal status: INITIAL  LONG TERM GOALS: Target date: 10/29/22  Pt will be measured for new long term compression garments Baseline:  Goal status: INITIAL  2.  Pt will decrease circumferences in the forearm  by at least 2cm to decrease risk of infection Baseline:  Goal status: INITIAL   PLAN:  PT FREQUENCY: 1x/week  PT DURATION: 6 weeks  PLANNED INTERVENTIONS: Therapeutic exercises, Patient/Family education, Self Care, Manual therapy, and Re-evaluation  PLAN FOR NEXT SESSION: use velcro, will order day and night as able custom vs another exostrong?   Idamae Lusher, PT 09/16/2022, 12:54 PM

## 2022-09-23 ENCOUNTER — Encounter: Payer: Medicare Other | Admitting: Rehabilitation

## 2022-09-30 ENCOUNTER — Encounter: Payer: Medicare Other | Admitting: Rehabilitation

## 2022-10-07 ENCOUNTER — Encounter: Payer: Medicare Other | Admitting: Rehabilitation

## 2022-10-14 ENCOUNTER — Ambulatory Visit
Admission: RE | Admit: 2022-10-14 | Discharge: 2022-10-14 | Disposition: A | Discharge status: Home / Self Care | Payer: Medicare Other | Source: Ambulatory Visit | Attending: Family Medicine | Admitting: Family Medicine

## 2022-10-14 ENCOUNTER — Encounter: Payer: Self-pay | Admitting: Rehabilitation

## 2022-10-14 ENCOUNTER — Ambulatory Visit: Payer: Medicare Other | Admitting: Rehabilitation

## 2022-10-14 DIAGNOSIS — Z483 Aftercare following surgery for neoplasm: Secondary | ICD-10-CM

## 2022-10-14 DIAGNOSIS — M25611 Stiffness of right shoulder, not elsewhere classified: Secondary | ICD-10-CM

## 2022-10-14 DIAGNOSIS — I972 Postmastectomy lymphedema syndrome: Secondary | ICD-10-CM

## 2022-10-14 DIAGNOSIS — Z1231 Encounter for screening mammogram for malignant neoplasm of breast: Secondary | ICD-10-CM

## 2022-10-14 DIAGNOSIS — M6281 Muscle weakness (generalized): Secondary | ICD-10-CM

## 2022-10-14 NOTE — Therapy (Signed)
OUTPATIENT PHYSICAL THERAPY ONCOLOGY TREATMENT  Patient Name: Charlene Perry MRN: [id number] DOB:1950-11-18, 72 y.o., female Today's Date: 10/14/2022  END OF SESSION:  PT End of Session - 10/14/22 1356     Visit Number 3    Number of Visits 7    Date for PT Re-Evaluation 10/29/22    PT Start Time 1400    PT Stop Time 1415    PT Time Calculation (min) 15 min    Activity Tolerance Patient tolerated treatment well    Behavior During Therapy WFL for tasks assessed/performed              Past Medical History:  Diagnosis Date   Arthritis    Breast cancer in female [facility])    Right   Cancer (HCC)    Diabetes mellitus without complication (HCC)    not on medication   Family history of breast cancer    Family history of colon cancer    Hypertension    takes fluid pills   Hypothyroidism    Pink eye disease of left eye    went to eye md and received drops - yesterday.   Renal disorder    Sleep apnea    back in the 1990's   Vitiligo    Past Surgical History:  Procedure Laterality Date   AV FISTULA PLACEMENT Left 02/21/2020   Procedure: CREATION of LEFT ARM BRACHIOCEPHALIC FISTULA;  Surgeon: Chuck Hint, MD;  Location: [facility] OR;  Service: Vascular;  Laterality: Left;   AXILLARY LYMPH NODE DISSECTION Right 07/13/2018   Procedure: RIGHT AXILLARY LYMPH NODE DISSECTION;  Surgeon: Emelia Loron, MD;  Location: [facility] OR;  Service: General;  Laterality: Right;   BREAST BIOPSY Bilateral 01/2019   BREAST SURGERY     left breast fibro adenoma   CATARACT EXTRACTION Right 2019   CERVICAL FUSION N/A    C5 &6   CESAREAN SECTION     CHOLECYSTECTOMY  2004-2005   CYSTECTOMY     patient denies   EYE SURGERY     IR FLUORO GUIDE CV LINE RIGHT  09/07/2019   IR IMAGING GUIDED PORT INSERTION  07/29/2018   IR REMOVAL TUN ACCESS W/ PORT W/O FL MOD SED  09/25/2021   IR US GUIDE VASC ACCESS RIGHT  09/07/2019   MASTECTOMY Right 06/21/2018   MASTECTOMY WITH RADIOACTIVE SEED GUIDED  EXCISION AND AXILLARY SENTINEL LYMPH NODE BIOPSY Right 06/21/2018   MASTECTOMY WITH RADIOACTIVE SEED GUIDED EXCISION AND AXILLARY SENTINEL LYMPH NODE BIOPSY Right 06/21/2018   Procedure: RIGHT TOTAL MASTECTOMY WITH RIGHT AXILLARY SENTINEL NODE BIOPSY AND RIGHT SEED GUIDED NODE EXCISION;  Surgeon: Emelia Loron, MD;  Location: [facility] OR;  Service: General;  Laterality: Right;   MYOMECTOMY  1986   PARTIAL HYSTERECTOMY  1995   PILONIDAL CYST EXCISION     PORTACATH PLACEMENT N/A 07/13/2018   Procedure: INSERTION PORT-A-CATH WITH ULTRASOUND;  Surgeon: Emelia Loron, MD;  Location: [facility] OR;  Service: General;  Laterality: N/A;   TONSILLECTOMY  2002   UVULOPALATOPHARYNGOPLASTY, TONSILLECTOMY AND SEPTOPLASTY     Patient Active Problem List   Diagnosis Date Noted   Microproteinuria 01/11/2020   Allergy, unspecified, initial encounter 01/04/2020   Anaphylactic shock, unspecified, initial encounter 01/04/2020   Anemia in chronic kidney disease 12/05/2019   Hypokalemia 12/05/2019   ESRD (end stage renal disease) on dialysis (HCC) 10/03/2019   Moderate protein-calorie malnutrition (HCC) 09/15/2019   Anemia, unspecified 09/09/2019   Coagulation defect, unspecified (HCC) 09/09/2019   Diarrhea, unspecified 09/09/2019  Dyspnea, unspecified 09/09/2019   Essential (primary) hypertension 09/09/2019   Iron deficiency anemia, unspecified 09/09/2019   Obstructive sleep apnea 09/09/2019   Pain, unspecified 09/09/2019   Pruritus, unspecified 09/09/2019   Secondary hyperparathyroidism of renal origin (HCC) 09/09/2019   Other acute kidney failure (HCC) 09/06/2019   Diabetes mellitus type II, non insulin dependent (HCC) 09/03/2019   Acute pyelonephritis    Acute kidney injury (HCC) 09/02/2019   History of therapeutic radiation 04/06/2019   Back pain with radiation 09/13/2018   Port-A-Cath in place 08/24/2018   Breast cancer, right (HCC) 06/21/2018   Genetic testing 01/14/2018   Family history of colon  cancer    Family history of breast cancer    Malignant neoplasm of upper-outer quadrant of right breast in female, estrogen receptor positive (HCC) 12/15/2017   Plantar fasciitis, right 09/30/2017   Hyperlipemia 09/26/2011   Hypothyroidism 08/25/2002   Vitiligo 01/26/2002    PCP: Dr. Janeece Fitting  REFERRING PROVIDER: Dr. Pamelia Hoit  REFERRING DIAG: C50.411,Z17.0 (ICD-10-CM) - Malignant neoplasm of upper-outer quadrant of right breast in female, estrogen receptor positive (HCC)   THERAPY DIAG:  Postmastectomy lymphedema  Stiffness of right shoulder, not elsewhere classified  Muscle weakness (generalized)  Aftercare following surgery for neoplasm  ONSET DATE: 2019  Rationale for Evaluation and Treatment: Rehabilitation  SUBJECTIVE:                                                                                                                                                                                           SUBJECTIVE STATEMENT: I had a mammogram this morning.  My sleeve seems to be working.    PERTINENT HISTORY:  right breast cancer in 2019  with mastectomy in 2020  with lymph node removal and had to have a second surgery for more lymph node removal with chemo and radiation. Lymphedema started October 2022  Past history includes end stage renal disease. she has a port on the right side of abdomen ( Dr.Joseph  Coladonato at [employer] [facility]) She has a fistula in left arm. She lives 45 minutes away from here. Past history includes widespread vitiligo. Treated here previously    PAIN:  Are you having pain? No  PRECAUTIONS: end stage renal disease, chronic lymphedema  WEIGHT BEARING RESTRICTIONS: No  FALLS:  Has patient fallen in last 6 months? No  LIVING ENVIRONMENT: Lives with: lives with their family and lives with their spouse  PATIENT GOALS: I don't want it to get    OBJECTIVE:  PALPATION: +1 pitting forearm and elbow and no pitting upper arm.    OBSERVATIONS /  OTHER ASSESSMENTS: Rt arm larger   LYMPHEDEMA  ASSESSMENTS:   [facility] Left 10/10/22 09/03/22 09/16/22 10/14/22  Axilla   40.8 40.5 40  15 cm proximal 36.5 41 41 39.6  10 cm proximal to olecranon process 36.1 38.4 39 39.4  Olecranon process 28.8 32 33 31.5  15cm proximal 28.7 33.7 31.4 32.2  10 cm proximal to ulnar styloid process 25.5 29.3 27.5 28  Just proximal to ulnar styloid process 17.5 18.5 19.4 17.3  Across hand at thumb web space 19.7 20.6 21.5 20.6  At base of 2nd digit 7.2 7.5 7.5 7.5  (Blank rows = not tested) 8cm length    TODAY'S TREATMENT:                                                                                                                                         DATE:  10/14/22 Updated measurements with pt demonstrating good containment with her sleeve  Discussed getting new sleeve and how remakes work Showed pt donning with arm butler Pt will continue using her sleeve daily.   09/16/22 Discussed updated POC.  Discussed pt situation with other therapists and we decided that due to ESRD being a contraindication to MLD that we would focus on appropriate compression without MLD at this point and that if she gets off the transplant list or gets a transplant then we will revisit this.   Reviewed velcro garment application as the wrist looked loose compared to the upper arm.  Reviewed distal to proximal pressure.    09/03/22 Eval performed Review of CDT options: pt would still not like to bandage but would be able to use her velcro more frequently.   Reviewed MLD with pt having learned this previously but not doing it.  5 diaphragmatic breaths, bil axillary nodes and establishment of interaxillary pathway, Rt inguinal nodes and establishment of axilloinguinal pathway, then Rt UE working proximal to distal, moving fluid from upper inner arm outwards, and doing both sides of forearm moving fluid towards pathways spending extra time in any areas of fibrosis then retracing  all steps. Each step was read by PT and then demonstrated and then performed by Pt.   PATIENT EDUCATION:  Education details: POC, compression options Person educated: Patient and Spouse Education method: Explanation Education comprehension: verbalized understanding  HOME EXERCISE PROGRAM: Self MLD, use velcro  ASSESSMENT:  CLINICAL IMPRESSION: Remeasured UE which is down a bit from starting to wear her compression more regularly.  She wanted to just be remeasured to see if she was doing well with her flat knit garment day only and nothing at night as the velcro seemed to be making her hand puffy.     OBJECTIVE IMPAIRMENTS: Edema, decreased UE mobility, at risk for infections  ACTIVITY LIMITATIONS: lifting  PARTICIPATION LIMITATIONS: none  PERSONAL FACTORS: 1-2 comorbidities: Lymph node removal, end stage kidney disease  are also affecting patient's functional outcome.   REHAB POTENTIAL: Excellent  CLINICAL DECISION  MAKING: Evolving/moderate complexity  EVALUATION COMPLEXITY: Low  GOALS: Goals reviewed with patient? Yes  SHORT TERM GOALS: Target date: 09/15/22  Pt will be ind with self MLD Baseline: Goal status: INITIAL  LONG TERM GOALS: Target date: 10/29/22  Pt will be measured for new long term compression garments Baseline:  Goal status: INITIAL  2.  Pt will decrease circumferences in the forearm by at least 2cm to decrease risk of infection Baseline:  Goal status: INITIAL   PLAN:  PT FREQUENCY: 1x/week  PT DURATION: 6 weeks  PLANNED INTERVENTIONS: Therapeutic exercises, Patient/Family education, Self Care, Manual therapy, and Re-evaluation  PLAN FOR NEXT SESSION: use velcro, will order day and night as able custom vs another exostrong?   Idamae Lusher, PT 10/14/2022, 2:32 PM

## 2022-10-21 ENCOUNTER — Encounter: Payer: Medicare Other | Admitting: Rehabilitation

## 2022-10-24 ENCOUNTER — Ambulatory Visit (INDEPENDENT_AMBULATORY_CARE_PROVIDER_SITE_OTHER): Payer: Medicare Other | Admitting: Pulmonary Disease

## 2022-10-24 ENCOUNTER — Encounter (HOSPITAL_BASED_OUTPATIENT_CLINIC_OR_DEPARTMENT_OTHER): Payer: Self-pay | Admitting: Pulmonary Disease

## 2022-10-24 VITALS — BP 126/72 | HR 79 | Ht 66.0 in | Wt 221.0 lb

## 2022-10-24 DIAGNOSIS — G4733 Obstructive sleep apnea (adult) (pediatric): Secondary | ICD-10-CM | POA: Diagnosis not present

## 2022-10-24 NOTE — Progress Notes (Signed)
   Subjective:    Patient ID: Charlene Perry, female    DOB: January 24, 1951, 72 y.o.   MRN: [id number]  HPI  72 yo for FU of OSA   OSA diagnosed in 2002 s/p UPPP, was on CPAP 11 cm, stopped in 2018   PMH - ESRD on PD Breast CA 2019 -lymphedema right arm  Chief Complaint  Patient presents with   Follow-up    Pt states she has been doing okay since last visit and denies any complaints.    Annual follow-up visit. She is off peritoneal dialysis now, making some urine and has had some renal recovery.  Remains on the transplant list.  She has an AV fistula on her left arm which was never used, right arm is restricted due to lymphedema from breast cancer She is using her CPAP machine more regularly.  When the humidity gets used up, dries her out and then she cannot use her machine but she is doing better.  She can tell a difference when she uses her machine, is more rested and has more energy the next day. No problems with mask or pressure  She has gained some weight and wonders if it is fluid weight  Significant tests/ events reviewed   HST 11/2019 >> (196 lbs ) mild OSA with AHI 13/ hr HST 11/2018 AHI 0.6/h  Review of Systems neg for any significant sore throat, dysphagia, itching, sneezing, nasal congestion or excess/ purulent secretions, fever, chills, sweats, unintended wt loss, pleuritic or exertional cp, hempoptysis, orthopnea pnd or change in chronic leg swelling. Also denies presyncope, palpitations, heartburn, abdominal pain, nausea, vomiting, diarrhea or change in bowel or urinary habits, dysuria,hematuria, rash, arthralgias, visual complaints, headache, numbness weakness or ataxia.     Objective:   Physical Exam  Gen. Pleasant, obese, in no distress ENT - no lesions, no post nasal drip Neck: No JVD, no thyromegaly, no carotid bruits Lungs: no use of accessory muscles, no dullness to percussion, decreased without rales or rhonchi  Cardiovascular: Rhythm regular, heart  sounds  normal, no murmurs or gallops, no peripheral edema Musculoskeletal: No deformities, no cyanosis or clubbing , no tremors        Assessment & Plan:

## 2022-10-24 NOTE — Patient Instructions (Signed)
CPAP is working well 

## 2022-10-24 NOTE — Assessment & Plan Note (Signed)
CPAP download was reviewed which shows good control of events on auto settings 5 to 15 cm with average pressure of 14 and maximum pressure of 14.5 cm.  She only has a couple minutes nights, average usage is about 6 hours Overall this is good compliance and CPAP is only helped improve her daytime somnolence and fatigue. CPAP supplies will be renewed for a year  Weight loss encouraged, compliance with goal of at least 4-6 hrs every night is the expectation. Advised against medications with sedative side effects Cautioned against driving when sleepy - understanding that sleepiness will vary on a day to day basis

## 2022-11-19 ENCOUNTER — Telehealth: Payer: Self-pay | Admitting: Pharmacy Technician

## 2022-11-19 ENCOUNTER — Other Ambulatory Visit: Payer: Self-pay

## 2022-11-19 NOTE — Telephone Encounter (Addendum)
Auth Submission: NO AUTH NEEDED Site of care: Site of care: [facility] Payer: MEDICARE A/B Medication & CPT/J Code(s) submitted:  Route of submission (phone, fax, portal):  Phone # [phone] Fax # Auth type: Buy/Bill Units/visits requested: Q4WKS Reference number: [id number] Approval from:  11/19/22 - 06/16/23  BCBS= INACTIVE Patient will be responsible for remaining 20% Updated 12/29/22

## 2022-11-20 ENCOUNTER — Encounter (HOSPITAL_COMMUNITY): Payer: Self-pay | Admitting: Nephrology

## 2022-11-24 ENCOUNTER — Ambulatory Visit: Payer: Medicare Other | Admitting: Pulmonary Disease

## 2022-11-25 ENCOUNTER — Encounter: Payer: Self-pay | Admitting: Rehabilitation

## 2022-11-27 ENCOUNTER — Telehealth: Payer: Self-pay

## 2022-11-27 ENCOUNTER — Encounter (HOSPITAL_COMMUNITY)
Admission: RE | Admit: 2022-11-27 | Discharge: 2022-11-27 | Disposition: A | Discharge status: Home / Self Care | Payer: Medicare Other | Source: Ambulatory Visit | Attending: Nephrology | Admitting: Nephrology

## 2022-11-27 ENCOUNTER — Encounter (HOSPITAL_COMMUNITY): Payer: Self-pay | Admitting: Nephrology

## 2022-11-27 VITALS — BP 175/70 | HR 80 | Temp 98.4°F | Resp 16

## 2022-11-27 DIAGNOSIS — D631 Anemia in chronic kidney disease: Secondary | ICD-10-CM | POA: Diagnosis not present

## 2022-11-27 DIAGNOSIS — N189 Chronic kidney disease, unspecified: Secondary | ICD-10-CM | POA: Diagnosis present

## 2022-11-27 DIAGNOSIS — N184 Chronic kidney disease, stage 4 (severe): Secondary | ICD-10-CM | POA: Diagnosis not present

## 2022-11-27 LAB — POCT HEMOGLOBIN-HEMACUE: Hemoglobin: 9.1 g/dL — ABNORMAL LOW (ref 12.0–15.0)

## 2022-11-27 MED ORDER — EPOETIN ALFA-EPBX 10000 UNIT/ML IJ SOLN
20000.0000 [IU] | Freq: Once | INTRAMUSCULAR | Status: AC
  Administered 2022-11-27: 20000 [IU] via SUBCUTANEOUS

## 2022-11-27 MED ORDER — CLONIDINE HCL 0.1 MG PO TABS: 0.1000 mg | ORAL_TABLET | Freq: Once | ORAL | Status: DC | PRN

## 2022-11-27 NOTE — Telephone Encounter (Signed)
Called Amber [phone]), anemia clinic at [facility] (Dr. Grayland Jack office). Patient states BP should be taken on left leg due to upper extremity restrictions. Called for advice as to whether BP parameters on Retacrit order should remain the same, or be altered given BP site. Left VM requesting call back.  Wyvonne Lenz, RN

## 2022-11-27 NOTE — Addendum Note (Signed)
Encounter addended by: Wyvonne Lenz, RN on: 11/27/2022 12:18 PM  Actions taken: Therapy plan modified

## 2022-11-27 NOTE — Progress Notes (Signed)
Diagnosis: Anemia in Chronic Kidney Disease  Provider:  Terrial Rhodes MD  Procedure: Injection  Retacrit (epoetin alfa-epbx), Dose: 20000 Units, Site: subcutaneous, Number of injections: 1  Hgb 9.1  Post Care: Observation period completed  Discharge: Condition: Good, Destination: Home . AVS Provided  Performed by:  Wyvonne Lenz, RN

## 2022-12-12 ENCOUNTER — Encounter (HOSPITAL_COMMUNITY): Payer: Self-pay | Admitting: Nephrology

## 2022-12-15 ENCOUNTER — Encounter (HOSPITAL_COMMUNITY): Payer: Self-pay | Admitting: Nephrology

## 2022-12-18 ENCOUNTER — Encounter (HOSPITAL_COMMUNITY): Payer: Self-pay | Admitting: Nephrology

## 2022-12-25 ENCOUNTER — Encounter (HOSPITAL_COMMUNITY): Payer: Self-pay | Admitting: Nephrology

## 2022-12-25 ENCOUNTER — Encounter (HOSPITAL_COMMUNITY)
Admission: RE | Admit: 2022-12-25 | Discharge: 2022-12-25 | Disposition: A | Discharge status: Home / Self Care | Payer: Medicare Other | Source: Ambulatory Visit | Attending: Nephrology | Admitting: Nephrology

## 2022-12-25 VITALS — BP 148/62 | HR 77 | Temp 98.3°F | Resp 18

## 2022-12-25 DIAGNOSIS — N184 Chronic kidney disease, stage 4 (severe): Secondary | ICD-10-CM | POA: Diagnosis not present

## 2022-12-25 DIAGNOSIS — D631 Anemia in chronic kidney disease: Secondary | ICD-10-CM | POA: Diagnosis present

## 2022-12-25 LAB — POCT HEMOGLOBIN-HEMACUE: Hemoglobin: 9.5 g/dL — ABNORMAL LOW (ref 12.0–15.0)

## 2022-12-25 LAB — IRON AND TIBC
Iron: 62 ug/dL (ref 28–170)
Saturation Ratios: 33 % — ABNORMAL HIGH (ref 10.4–31.8)
TIBC: 189 ug/dL — ABNORMAL LOW (ref 250–450)
UIBC: 127 ug/dL

## 2022-12-25 LAB — FERRITIN: Ferritin: 666 ng/mL — ABNORMAL HIGH (ref 11–307)

## 2022-12-25 MED ORDER — EPOETIN ALFA-EPBX 10000 UNIT/ML IJ SOLN
20000.0000 [IU] | Freq: Once | INTRAMUSCULAR | Status: AC
  Administered 2022-12-25: 20000 [IU] via SUBCUTANEOUS

## 2022-12-25 NOTE — Progress Notes (Signed)
Diagnosis: Anemia in Chronic Kidney Disease  Provider:  Terrial Rhodes MD  Procedure: Injection  Retacrit (epoetin alfa-epbx), Dose: 20000 Units, Site: subcutaneous, Number of injections: 1  Hgb 9.5  Post Care: Observation period completed  Discharge: Condition: Good, Destination: Home . AVS Provided  Performed by:  Arrie Senate, RN

## 2022-12-25 NOTE — Addendum Note (Signed)
Encounter addended by: Arrie Senate, RN on: 12/25/2022 1:47 PM  Actions taken: Therapy plan modified

## 2023-01-08 ENCOUNTER — Encounter (HOSPITAL_COMMUNITY): Admission: RE | Admit: 2023-01-08 | Payer: Medicare Other | Source: Ambulatory Visit

## 2023-01-21 ENCOUNTER — Encounter (HOSPITAL_COMMUNITY): Payer: Medicare Other | Attending: Nephrology | Admitting: Emergency Medicine

## 2023-01-21 VITALS — BP 171/77 | HR 78 | Temp 98.4°F | Resp 18

## 2023-01-21 DIAGNOSIS — D631 Anemia in chronic kidney disease: Secondary | ICD-10-CM | POA: Diagnosis present

## 2023-01-21 DIAGNOSIS — N189 Chronic kidney disease, unspecified: Secondary | ICD-10-CM | POA: Insufficient documentation

## 2023-01-21 MED ORDER — EPOETIN ALFA-EPBX 40000 UNIT/ML IJ SOLN
20000.0000 [IU] | Freq: Once | INTRAMUSCULAR | Status: AC
  Administered 2023-01-21: 20000 [IU] via SUBCUTANEOUS

## 2023-01-21 NOTE — Progress Notes (Signed)
Diagnosis: Anemia in Chronic Kidney Disease  Provider:  Terrial Rhodes MD  Procedure: Injection  Retacrit (epoetin alfa-epbx), Dose: 20000 units, Site: subcutaneous, Number of injections: 1 Hemoglobin 10.1  Post Care: Patient declined observation  Discharge: Condition: Good, Destination: Home . AVS Provided  Performed by:  Arrie Senate, RN

## 2023-01-22 ENCOUNTER — Encounter (HOSPITAL_COMMUNITY): Admission: RE | Admit: 2023-01-22 | Payer: Medicare Other | Source: Ambulatory Visit

## 2023-01-22 ENCOUNTER — Other Ambulatory Visit: Payer: Self-pay

## 2023-01-22 ENCOUNTER — Ambulatory Visit: Payer: Medicare Other

## 2023-02-03 ENCOUNTER — Telehealth: Payer: Self-pay

## 2023-02-03 NOTE — Telephone Encounter (Signed)
Auth Submission: NO AUTH NEEDED Site of care: Site of care: [facility] Payer: Medicare A/B, BLUE CROSS BLUE SHIELD  Medication & CPT/J Code(s) submitted: Retacrit [phone] Route of submission (phone, fax, portal): phone Phone # Fax # Auth type: Buy/Bill HB Units/visits requested: 20,000u,q4weeks Reference number: [id number] Approval from: 02/03/23 to 06/16/23

## 2023-02-05 ENCOUNTER — Encounter (HOSPITAL_COMMUNITY): Payer: Medicare Other

## 2023-02-18 ENCOUNTER — Encounter: Payer: Self-pay | Admitting: Nephrology

## 2023-02-18 ENCOUNTER — Ambulatory Visit: Payer: Medicare Other

## 2023-02-18 NOTE — Addendum Note (Signed)
Addended by: Marin Shutter E on: 02/18/2023 02:16 PM   Modules accepted: Orders

## 2023-02-19 ENCOUNTER — Ambulatory Visit (HOSPITAL_COMMUNITY): Payer: Medicare Other

## 2023-02-23 ENCOUNTER — Other Ambulatory Visit: Payer: Self-pay | Admitting: Emergency Medicine

## 2023-02-23 ENCOUNTER — Encounter: Payer: Medicare Other | Attending: Nephrology | Admitting: *Deleted

## 2023-02-23 VITALS — BP 165/88 | HR 84 | Temp 98.2°F | Resp 16

## 2023-02-23 DIAGNOSIS — D631 Anemia in chronic kidney disease: Secondary | ICD-10-CM | POA: Insufficient documentation

## 2023-02-23 DIAGNOSIS — N184 Chronic kidney disease, stage 4 (severe): Secondary | ICD-10-CM | POA: Insufficient documentation

## 2023-02-23 MED ORDER — EPOETIN ALFA-EPBX 10000 UNIT/ML IJ SOLN
20000.0000 [IU] | Freq: Once | INTRAMUSCULAR | Status: AC
  Administered 2023-02-23: 20000 [IU] via SUBCUTANEOUS

## 2023-02-23 NOTE — Progress Notes (Signed)
Diagnosis: Anemia in Chronic Kidney Disease  Provider:  Terrial Rhodes MD  Procedure: Injection  Retacrit (epoetin alfa-epbx), Dose: 20000 Units, Site: subcutaneous, Number of injections: 1  Hgb. 11.2  Post Care: Observation period completed  Discharge: Condition: Good, Destination: Home . AVS Provided  Performed by:  Daleen Squibb, RN

## 2023-02-24 LAB — IRON,TIBC AND FERRITIN PANEL
%SAT: 36 % (ref 16–45)
Ferritin: 610 ng/mL — ABNORMAL HIGH (ref 16–288)
Iron: 69 ug/dL (ref 45–160)
TIBC: 193 ug/dL — ABNORMAL LOW (ref 250–450)

## 2023-03-19 ENCOUNTER — Ambulatory Visit (HOSPITAL_COMMUNITY): Payer: Medicare Other

## 2023-03-24 ENCOUNTER — Encounter: Payer: Medicare Other | Attending: Nephrology | Admitting: *Deleted

## 2023-03-24 VITALS — BP 171/60 | HR 79 | Temp 98.8°F | Resp 18

## 2023-03-24 DIAGNOSIS — D631 Anemia in chronic kidney disease: Secondary | ICD-10-CM | POA: Diagnosis not present

## 2023-03-24 DIAGNOSIS — N189 Chronic kidney disease, unspecified: Secondary | ICD-10-CM | POA: Insufficient documentation

## 2023-03-24 LAB — GLUCOSE HEMOCUE WAIVED: Hemoglobin: 10.6

## 2023-03-24 MED ORDER — EPOETIN ALFA-EPBX 10000 UNIT/ML IJ SOLN
20000.0000 [IU] | Freq: Once | INTRAMUSCULAR | Status: AC
  Administered 2023-03-24: 20000 [IU] via SUBCUTANEOUS
  Filled 2023-03-24: qty 2

## 2023-03-24 NOTE — Progress Notes (Signed)
Diagnosis: Anemia in Chronic Kidney Disease  Provider:  Terrial Rhodes MD  Procedure: Injection  Retacrit (epoetin alfa-epbx), Dose: 20000 Units, Site: subcutaneous, Number of injections: 1  Hgb. 10.6  Post Care: Observation period completed  Discharge: Condition: Good, Destination: Home . AVS Provided  Performed by:  Daleen Squibb, RN

## 2023-03-25 ENCOUNTER — Other Ambulatory Visit: Payer: Self-pay | Admitting: Gastroenterology

## 2023-04-23 ENCOUNTER — Other Ambulatory Visit: Payer: Self-pay | Admitting: Emergency Medicine

## 2023-04-23 ENCOUNTER — Encounter: Payer: Medicare Other | Attending: Nephrology | Admitting: Internal Medicine

## 2023-04-23 VITALS — BP 185/80 | HR 82 | Temp 98.4°F | Resp 16

## 2023-04-23 DIAGNOSIS — D631 Anemia in chronic kidney disease: Secondary | ICD-10-CM | POA: Insufficient documentation

## 2023-04-23 DIAGNOSIS — N189 Chronic kidney disease, unspecified: Secondary | ICD-10-CM | POA: Insufficient documentation

## 2023-04-23 MED ORDER — CLONIDINE HCL 0.1 MG PO TABS: 0.1000 mg | ORAL_TABLET | ORAL | Status: DC | PRN

## 2023-04-23 MED ORDER — EPOETIN ALFA-EPBX 10000 UNIT/ML IJ SOLN
20000.0000 [IU] | Freq: Once | INTRAMUSCULAR | Status: AC
Start: 2023-04-23 — End: 2023-04-23
  Administered 2023-04-23: 20000 [IU] via SUBCUTANEOUS

## 2023-04-23 NOTE — Progress Notes (Signed)
Diagnosis: Anemia in Chronic Kidney Disease  Provider:  Terrial Rhodes MD  Procedure: Injection  Retacrit (epoetin alfa-epbx), Dose: 20000 Units, Site: subcutaneous, Number of injections: 1  Post Care: Observation period completed  Discharge: Condition: Good, Destination: Home . AVS Provided  Performed by:  Feliberto Harts, LPN

## 2023-04-24 LAB — IRON,TIBC AND FERRITIN PANEL
%SAT: 36 % (ref 16–45)
Ferritin: 624 ng/mL — ABNORMAL HIGH (ref 16–288)
Iron: 66 ug/dL (ref 45–160)
TIBC: 182 ug/dL — ABNORMAL LOW (ref 250–450)

## 2023-04-27 ENCOUNTER — Emergency Department (HOSPITAL_BASED_OUTPATIENT_CLINIC_OR_DEPARTMENT_OTHER): Payer: Medicare Other

## 2023-04-27 ENCOUNTER — Encounter (HOSPITAL_BASED_OUTPATIENT_CLINIC_OR_DEPARTMENT_OTHER): Payer: Self-pay

## 2023-04-27 ENCOUNTER — Emergency Department (HOSPITAL_BASED_OUTPATIENT_CLINIC_OR_DEPARTMENT_OTHER)
Admission: EM | Admit: 2023-04-27 | Discharge: 2023-04-27 | Disposition: A | Discharge status: Home / Self Care | Payer: Medicare Other | Attending: Emergency Medicine | Admitting: Emergency Medicine

## 2023-04-27 ENCOUNTER — Other Ambulatory Visit: Payer: Self-pay

## 2023-04-27 DIAGNOSIS — Z853 Personal history of malignant neoplasm of breast: Secondary | ICD-10-CM | POA: Insufficient documentation

## 2023-04-27 DIAGNOSIS — M5416 Radiculopathy, lumbar region: Secondary | ICD-10-CM

## 2023-04-27 DIAGNOSIS — M79605 Pain in left leg: Secondary | ICD-10-CM

## 2023-04-27 DIAGNOSIS — E1122 Type 2 diabetes mellitus with diabetic chronic kidney disease: Secondary | ICD-10-CM | POA: Diagnosis not present

## 2023-04-27 DIAGNOSIS — Z7982 Long term (current) use of aspirin: Secondary | ICD-10-CM | POA: Insufficient documentation

## 2023-04-27 DIAGNOSIS — N189 Chronic kidney disease, unspecified: Secondary | ICD-10-CM | POA: Insufficient documentation

## 2023-04-27 LAB — BASIC METABOLIC PANEL
Anion gap: 10 (ref 5–15)
BUN: 53 mg/dL — ABNORMAL HIGH (ref 8–23)
CO2: 24 mmol/L (ref 22–32)
Calcium: 9.1 mg/dL (ref 8.9–10.3)
Chloride: 108 mmol/L (ref 98–111)
Creatinine, Ser: 4.17 mg/dL — ABNORMAL HIGH (ref 0.44–1.00)
GFR, Estimated: 11 mL/min — ABNORMAL LOW (ref >60)
Glucose, Bld: 102 mg/dL — ABNORMAL HIGH (ref 70–99)
Potassium: 4.1 mmol/L (ref 3.5–5.1)
Sodium: 142 mmol/L (ref 135–145)

## 2023-04-27 LAB — CBC WITH DIFFERENTIAL/PLATELET
Abs Immature Granulocytes: 0.05 10*3/uL (ref 0.00–0.07)
Basophils Absolute: 0 10*3/uL (ref 0.0–0.1)
Basophils Relative: 1 %
Eosinophils Absolute: 0.2 10*3/uL (ref 0.0–0.5)
Eosinophils Relative: 4 %
HCT: 32.3 % — ABNORMAL LOW (ref 36.0–46.0)
Hemoglobin: 10.3 g/dL — ABNORMAL LOW (ref 12.0–15.0)
Immature Granulocytes: 1 %
Lymphocytes Relative: 19 %
Lymphs Abs: 0.9 10*3/uL (ref 0.7–4.0)
MCH: 30 pg (ref 26.0–34.0)
MCHC: 31.9 g/dL (ref 30.0–36.0)
MCV: 94.2 fL (ref 80.0–100.0)
Monocytes Absolute: 0.5 10*3/uL (ref 0.1–1.0)
Monocytes Relative: 10 %
Neutro Abs: 3.2 10*3/uL (ref 1.7–7.7)
Neutrophils Relative %: 65 %
Platelets: 161 10*3/uL (ref 150–400)
RBC: 3.43 MIL/uL — ABNORMAL LOW (ref 3.87–5.11)
RDW: 14.7 % (ref 11.5–15.5)
WBC: 4.9 10*3/uL (ref 4.0–10.5)
nRBC: 0 % (ref 0.0–0.2)

## 2023-04-27 MED ORDER — METHOCARBAMOL 500 MG PO TABS: 500.0000 mg | ORAL_TABLET | Freq: Three times a day (TID) | ORAL | 0 refills | Status: DC | PRN

## 2023-04-27 MED ORDER — PREDNISONE 20 MG PO TABS: ORAL_TABLET | ORAL | 0 refills | Status: DC

## 2023-04-27 MED ORDER — OXYCODONE-ACETAMINOPHEN 5-325 MG PO TABS
1.0000 | ORAL_TABLET | Freq: Once | ORAL | Status: AC
  Administered 2023-04-27: 1 via ORAL
  Filled 2023-04-27: qty 1

## 2023-04-27 NOTE — ED Notes (Signed)
Patient transported to Ultrasound 

## 2023-04-27 NOTE — Discharge Instructions (Signed)
Please read and follow all provided instructions.  Your diagnoses today include:  1. Pain of left lower extremity   2. Lumbar radiculopathy     Tests performed today include: Vital signs - see below for your results today Ultrasound of your leg did not show a blood clot, did show possible Baker's cyst behind the left knee CT scan of your lumbar spine shows narrowing at the L4/L5 level which could be causing some compression on the nerves at this level Blood cell counts electrolytes: Your hemoglobin and kidney function looks stable  Medications prescribed:  Prednisone - steroid medicine   It is best to take this medication in the morning to prevent sleeping problems. If you are diabetic, monitor your blood sugar closely and stop taking Prednisone if blood sugar is over 300. Take with food to prevent stomach upset.   Robaxin (methocarbamol) - muscle relaxer medication  DO NOT drive or perform any activities that require you to be awake and alert because this medicine can make you drowsy.   Take any prescribed medications only as directed.  Home care instructions:  Follow any educational materials contained in this packet Please rest, use ice or heat on your back for the next several days Do not lift, push, pull anything more than 10 pounds for the next week  Follow-up instructions: Please follow-up with your primary care provider in the next 1 week for further evaluation of your symptoms.   Return instructions:  SEEK IMMEDIATE MEDICAL ATTENTION IF YOU HAVE: New numbness, tingling, weakness, or problem with the use of your arms or legs Severe back pain not relieved with medications Loss control of your bowels or bladder Increasing pain in any areas of the body (such as chest or abdominal pain) Shortness of breath, dizziness, or fainting.  Worsening nausea (feeling sick to your stomach), vomiting, fever, or sweats Any other emergent concerns regarding your health   Additional  Information:  Your vital signs today were: BP (!) 160/81   Pulse 74   Temp 97.8 F (36.6 C)   Resp 16   Ht 5\' 6"  (1.676 m)   Wt 98 kg   SpO2 98%   BMI 34.86 kg/m  If your blood pressure (BP) was elevated above 135/85 this visit, please have this repeated by your doctor within one month. --------------

## 2023-04-27 NOTE — ED Provider Notes (Signed)
[place] EMERGENCY DEPARTMENT AT [facility] Provider Note   CSN: [id number] Arrival date & time: 04/27/23  1149     History  Chief Complaint  Patient presents with   Leg Pain    Charlene Perry is a 72 y.o. female.  Patient with history of chronic kidney disease, previously on dialysis off for about 14 months, history of breast cancer, diabetes --presents to the emergency department today for evaluation of left lower extremity pain.  Symptoms started 2 days ago.  It started gradually.  No injuries or falls prior.  Patient describes a shooting throbbing pain down into her left thigh and lower leg from her buttocks area.  No lower back pain.  She does not have a history of similar pain or lower back surgeries.  She does not really feel like her leg is swollen.  No history of blood clots.  No chest pain or shortness of breath.  No fevers.  She is tried home medications at home with minimal improvement.  Today she was having a lot of difficulty walking and putting weight on her left leg, prompting emergency department visit as it was not getting better. Patient denies warning symptoms of back pain including: fecal incontinence, urinary retention or overflow incontinence, night sweats, waking from sleep with back pain, unexplained fevers or weight loss, IVDU, recent trauma.          Home Medications Prior to Admission medications   Medication Sig Start Date End Date Taking? Authorizing Provider  aspirin 81 MG chewable tablet Chew by mouth. 02/15/03   [provider]  carvedilol (COREG) 25 MG tablet Take 25 mg by mouth 2 (two) times daily. 05/17/21   [provider]  cetirizine (ZYRTEC) 10 MG tablet Take 10 mg by mouth daily.    [provider]  Docusate Sodium (DSS) 100 MG CAPS Take 1 capsule by mouth 2 (two) times daily. 08/27/21   Serena Croissant, MD  furosemide (LASIX) 20 MG tablet Take 1 tablet (20 mg total) by mouth daily. 08/27/21   Serena Croissant, MD  letrozole [facility]) 2.5 MG tablet Take 1 tablet (2.5 mg total) by mouth daily. 09/01/22   Serena Croissant, MD  levothyroxine (SYNTHROID, LEVOTHROID) 125 MCG tablet Take 125 mcg by mouth daily before breakfast.    [provider]  potassium chloride 20 MEQ TBCR Take 20 mEq by mouth daily. 08/27/21   Serena Croissant, MD  rosuvastatin (CRESTOR) 40 MG tablet Take 40 mg by mouth at bedtime.     [provider]      Allergies    Patient has no known allergies.    Review of Systems   Review of Systems  Physical Exam Updated Vital Signs BP (!) 190/96 (BP Location: Right Leg)   Pulse 79   Temp 97.7 F (36.5 C)   Resp 18   Ht 5\' 6"  (1.676 m)   Wt 98 kg   SpO2 99%   BMI 34.86 kg/m  Physical Exam Vitals and nursing note reviewed.  Constitutional:      Appearance: She is well-developed.  HENT:     Head: Normocephalic and atraumatic.     Nose: Nose normal.     Mouth/Throat:     Mouth: Mucous membranes are moist.  Eyes:     Conjunctiva/sclera: Conjunctivae normal.  Cardiovascular:     Rate and Rhythm: Normal rate.     Pulses:          Dorsalis pedis pulses are 2+ on  the right side and 2+ on the left side.  Pulmonary:     Effort: Pulmonary effort is normal.  Abdominal:     Palpations: Abdomen is soft.     Tenderness: There is no abdominal tenderness.  Musculoskeletal:        General: Normal range of motion.     Cervical back: Normal range of motion and neck supple.     Left hip: No tenderness. Normal range of motion.     Left upper leg: Tenderness present. No edema.     Left knee: Normal range of motion. No tenderness.     Right lower leg: No swelling. No edema.     Left lower leg: Tenderness present. No swelling. No edema.     Left ankle: No swelling. No tenderness. Normal range of motion.     Comments: No step-off noted with palpation of spine.   Skin:    General: Skin is warm and dry.     Findings: No rash.  Neurological:     Mental Status: She is  alert.     Sensory: No sensory deficit.     Comments: 5/5 strength in entire lower extremities bilaterally. No sensation deficit.      ED Results / Procedures / Treatments   Labs (all labs ordered are listed, but only abnormal results are displayed) Labs Reviewed  CBC WITH DIFFERENTIAL/PLATELET - Abnormal; Notable for the following components:      Result Value   RBC 3.43 (*)    Hemoglobin 10.3 (*)    HCT 32.3 (*)    All other components within normal limits  BASIC METABOLIC PANEL - Abnormal; Notable for the following components:   Glucose, Bld 102 (*)    BUN 53 (*)    Creatinine, Ser 4.17 (*)    GFR, Estimated 11 (*)    All other components within normal limits    EKG None  Radiology CT Lumbar Spine Wo Contrast  Result Date: 04/27/2023 CLINICAL DATA:  Low back pain, increased fracture risk. Low back pain with radiation into left leg, no history, trouble walking, age > 75. EXAM: CT LUMBAR SPINE WITHOUT CONTRAST TECHNIQUE: Multidetector CT imaging of the lumbar spine was performed without intravenous contrast administration. Multiplanar CT image reconstructions were also generated. RADIATION DOSE REDUCTION: This exam was performed according to the departmental dose-optimization program which includes automated exposure control, adjustment of the mA and/or kV according to patient size and/or use of iterative reconstruction technique. COMPARISON:  CT abdomen and pelvis 09/02/2019 FINDINGS: Segmentation: 5 lumbar type vertebrae. Alignment: Normal. Vertebrae: No acute fracture or suspicious osseous lesion. New moderate-sized L1 superior endplate Schmorl's node. Chronic degenerative endplate changes at L3-4 and L4-5. Paraspinal and other soft tissues: Abdominal aortic atherosclerosis without aneurysm. Small sliding hiatal hernia. Unchanged small left adrenal adenoma with no follow-up imaging required. Persistent asymmetric left perinephric stranding. Disc levels: Multilevel disc  degeneration, greatest at L4-5 where left eccentric disc bulging, endplate spurring, and mild facet arthrosis result in mild-to-moderate right and moderate left neural foraminal stenosis. Mild bilateral neural foraminal stenosis at L2-3 and L3-4. No evidence of high-grade spinal stenosis. IMPRESSION: 1. No acute fracture or high-grade spinal stenosis. 2. Multilevel disc degeneration, greatest at L4-5 where there is mild-to-moderate right and moderate left neural foraminal stenosis. 3.  Aortic Atherosclerosis (ICD10-I70.0). Electronically Signed   By: Sebastian Ache M.D.   On: 04/27/2023 15:50   US Venous Img Lower  Left (DVT Study)  Result Date: 04/27/2023 CLINICAL DATA:  Left  lower extremity pain EXAM: Left LOWER EXTREMITY VENOUS DOPPLER ULTRASOUND TECHNIQUE: Gray-scale sonography with compression, as well as color and duplex ultrasound, were performed to evaluate the deep venous system(s) from the level of the common femoral vein through the popliteal and proximal calf veins. COMPARISON:  None Available. FINDINGS: VENOUS Normal compressibility of the common femoral, superficial femoral, and popliteal veins, as well as the visualized calf veins. Visualized portions of profunda femoral vein and great saphenous vein unremarkable. No filling defects to suggest DVT on grayscale or color Doppler imaging. Doppler waveforms show normal direction of venous flow, normal respiratory plasticity and response to augmentation. Limited views of the contralateral common femoral vein are unremarkable. OTHER Slightly complex cystic area in the popliteal fossa measuring 5.6 x 1.4 x 3.3 cm. No abnormal blood flow on Doppler. Possible Baker's cyst. Limitations: none IMPRESSION: No evidence of left lower extremity DVT. Probable Baker's cyst. Confirmatory study can be performed as clinically appropriate. Electronically Signed   By: Karen Kays M.D.   On: 04/27/2023 13:39    Procedures Procedures    Medications Ordered in  ED Medications - No data to display  ED Course/ Medical Decision Making/ A&P    Patient seen and examined. History obtained directly from patient.  This is a new problem for the patient.  She does have multiple comorbidities however.  Symptoms are currently most consistent with a radicular type syndrome in her left leg.  Feel that it is reasonable given her medical conditions to check basic labs, obtain imaging of her lower spine and rule out DVT in the left leg.  Patient offered additional pain medication, she declines at this time.  She states that she took Tylenol prior to arrival which is helping to ease the pain.  Labs/EKG: Ordered CBC, BMP.  Imaging: Ordered CT lumbar without contrast, left lower extremity DVT study.  Medications/Fluids: None ordered  Most recent vital signs reviewed and are as follows: BP (!) 190/96 (BP Location: Right Leg)   Pulse 79   Temp 97.7 F (36.5 C)   Resp 18   Ht 5\' 6"  (1.676 m)   Wt 98 kg   SpO2 99%   BMI 34.86 kg/m   Initial impression: Left lower extremity pain with radicular features, however multiple comorbidities and this is the first time patient has had pain similar to this.  4:08 PM Reassessment performed. Patient appears stable.  She will be given a dose of oral Percocet prior to discharge.  Labs personally reviewed and interpreted including: CBC with normal white blood cell count and hemoglobin of 10.3 near baseline; BMP with creatinine at 4.17 which is slightly improved from baseline, BUN of 53.  Imaging personally visualized and interpreted including: CT lumbar spine, agree arthritic changes, L4/5 narrowing foraminal narrowing noted bilaterally.  Reviewed ultrasound results showing possible Baker's cyst, no DVT.  Reviewed pertinent lab work and imaging with patient at bedside. Questions answered.   Most current vital signs reviewed and are as follows: BP (!) 160/81   Pulse 74   Temp 97.8 F (36.6 C)   Resp 16   Ht 5\' 6"  (1.676  m)   Wt 98 kg   SpO2 98%   BMI 34.86 kg/m   Plan: Discharge to home.   Prescriptions written for: Robaxin, prednisone  4:09 PM Patient counseled on proper use of muscle relaxant medication.  They were told not to drink alcohol, drive any vehicle, or do any dangerous activities while taking this medication.  Patient verbalized understanding.  Other home care instructions discussed: Rest, gentle stretching, ice heat, no heavy lifting pushing or pulling.  ED return instructions discussed: New or worsening pain, inability to walk.  Follow-up instructions discussed: Patient encouraged to follow-up with their PCP in 7 days.                                 Medical Decision Making Amount and/or Complexity of Data Reviewed Labs: ordered. Radiology: ordered.  Risk Prescription drug management.   Patient with history as noted above presents with new left lower extremity pain overall with radicular features.  Patient does not have a history of lower back problems.  DVT study was negative.  Labs are at baseline given history of chronic kidney disease.  CT of the lumbar spine suggests some foraminal narrowing at the L4/L5 level left greater than right.  This could potentially explain the patient's symptoms.  Otherwise, no red flags.  Pain reasonably controlled with home medications.  Will trial prednisone trial and muscle relaxer as above.  Encouraged to follow-up with PCP.  The patient's vital signs, pertinent lab work and imaging were reviewed and interpreted as discussed in the ED course. Hospitalization was considered for further testing, treatments, or serial exams/observation. However as patient is well-appearing, has a stable exam, and reassuring studies today, I do not feel that they warrant admission at this time. This plan was discussed with the patient who verbalizes agreement and comfort with this plan and seems reliable and able to return to the Emergency Department with worsening or  changing symptoms.          Final Clinical Impression(s) / ED Diagnoses Final diagnoses:  Pain of left lower extremity  Lumbar radiculopathy    Rx / DC Orders ED Discharge Orders          Ordered    methocarbamol (ROBAXIN) 500 MG tablet  Every 8 hours PRN        04/27/23 1606    predniSONE (DELTASONE) 20 MG tablet        04/27/23 1606              Renne Crigler, PA-C 04/27/23 1611    Elayne Snare K, DO 04/28/23 [phone]

## 2023-04-27 NOTE — ED Triage Notes (Signed)
Pt arrives to triage in wheelchair with pain to left posterior leg since Saturday. Denies any injury to the leg. Pt has bilateral arm restrictions r/t lymphedema on right arm, fistula on left arm. Pt has been off dialysis for a year and is awaiting transplant.

## 2023-05-19 ENCOUNTER — Encounter (HOSPITAL_COMMUNITY): Payer: Self-pay | Admitting: Gastroenterology

## 2023-05-21 ENCOUNTER — Encounter: Payer: Medicare Other | Attending: Nephrology | Admitting: *Deleted

## 2023-05-21 VITALS — BP 152/78 | HR 92 | Temp 98.1°F | Resp 18

## 2023-05-21 DIAGNOSIS — Z79890 Hormone replacement therapy (postmenopausal): Secondary | ICD-10-CM | POA: Insufficient documentation

## 2023-05-21 DIAGNOSIS — D631 Anemia in chronic kidney disease: Secondary | ICD-10-CM | POA: Insufficient documentation

## 2023-05-21 DIAGNOSIS — N184 Chronic kidney disease, stage 4 (severe): Secondary | ICD-10-CM | POA: Insufficient documentation

## 2023-05-21 DIAGNOSIS — N189 Chronic kidney disease, unspecified: Secondary | ICD-10-CM | POA: Diagnosis present

## 2023-05-21 LAB — GLUCOSE HEMOCUE WAIVED: Hemoglobin: 10.9

## 2023-05-21 MED ORDER — EPOETIN ALFA-EPBX 10000 UNIT/ML IJ SOLN
20000.0000 [IU] | Freq: Once | INTRAMUSCULAR | Status: AC
Start: 2023-05-21 — End: 2023-05-21
  Administered 2023-05-21: 20000 [IU] via SUBCUTANEOUS

## 2023-05-21 MED ORDER — CLONIDINE HCL 0.1 MG PO TABS: 0.1000 mg | ORAL_TABLET | ORAL | Status: DC | PRN

## 2023-05-21 NOTE — Progress Notes (Signed)
Diagnosis: Anemia in Chronic Kidney Disease  Provider:  Terrial Rhodes MD  Procedure: Injection  Retacrit (epoetin alfa-epbx), Dose: 20000 Units, Site: subcutaneous, Number of injections: 1  Post Care: Observation period completed  Discharge: Condition: Good, Destination: Home . AVS Provided  Performed by:  Daleen Squibb, RN

## 2023-05-25 NOTE — Progress Notes (Signed)
Endoscopy scheduled for 12/9 patient called pre op stating she had cold symptoms, went to see pcp and they placed her on antibiotics for Bronchitis, patient to stop them 12/9, stating not really feeling better. Anesthesia notified, said will need to be rescheduled once she's 2 weeks from resolution of symptoms. Notified patient she will have to reschedule, lvm for Dr.Magods Nurse. Will remove from schedule for 12/9.

## 2023-05-26 ENCOUNTER — Emergency Department (HOSPITAL_BASED_OUTPATIENT_CLINIC_OR_DEPARTMENT_OTHER)
Admission: EM | Admit: 2023-05-26 | Discharge: 2023-05-26 | Disposition: A | Discharge status: Home / Self Care | Payer: Medicare Other | Attending: Emergency Medicine | Admitting: Emergency Medicine

## 2023-05-26 ENCOUNTER — Encounter (HOSPITAL_BASED_OUTPATIENT_CLINIC_OR_DEPARTMENT_OTHER): Payer: Self-pay

## 2023-05-26 ENCOUNTER — Other Ambulatory Visit: Payer: Self-pay

## 2023-05-26 ENCOUNTER — Ambulatory Visit (HOSPITAL_COMMUNITY): Admission: RE | Admit: 2023-05-26 | Payer: Medicare Other | Source: Home / Self Care | Admitting: Gastroenterology

## 2023-05-26 ENCOUNTER — Emergency Department (HOSPITAL_BASED_OUTPATIENT_CLINIC_OR_DEPARTMENT_OTHER): Payer: Medicare Other | Admitting: Radiology

## 2023-05-26 DIAGNOSIS — R059 Cough, unspecified: Secondary | ICD-10-CM | POA: Diagnosis present

## 2023-05-26 DIAGNOSIS — Z7982 Long term (current) use of aspirin: Secondary | ICD-10-CM | POA: Diagnosis not present

## 2023-05-26 DIAGNOSIS — J40 Bronchitis, not specified as acute or chronic: Secondary | ICD-10-CM | POA: Insufficient documentation

## 2023-05-26 DIAGNOSIS — Z79899 Other long term (current) drug therapy: Secondary | ICD-10-CM | POA: Insufficient documentation

## 2023-05-26 DIAGNOSIS — Z1152 Encounter for screening for COVID-19: Secondary | ICD-10-CM | POA: Insufficient documentation

## 2023-05-26 LAB — RESP PANEL BY RT-PCR (RSV, FLU A&B, COVID)  RVPGX2
Influenza A by PCR: NEGATIVE
Influenza B by PCR: NEGATIVE
Resp Syncytial Virus by PCR: NEGATIVE
SARS Coronavirus 2 by RT PCR: NEGATIVE

## 2023-05-26 SURGERY — COLONOSCOPY WITH PROPOFOL
Anesthesia: Monitor Anesthesia Care

## 2023-05-26 MED ORDER — AMOXICILLIN-POT CLAVULANATE 500-125 MG PO TABS: 1.0000 | ORAL_TABLET | Freq: Two times a day (BID) | ORAL | 0 refills | Status: DC

## 2023-05-26 MED ORDER — ALBUTEROL SULFATE HFA 108 (90 BASE) MCG/ACT IN AERS
2.0000 | INHALATION_SPRAY | RESPIRATORY_TRACT | Status: DC | PRN
Start: 2023-05-26 — End: 2023-05-27

## 2023-05-26 NOTE — ED Triage Notes (Signed)
Pt Charlene Perry and ambulatory c/o productive cough approx 2-3 wks. Pt states she was prescribed abx and finished it today but has not gotten any better. Pt's PCP told pt to come to ED because of chest pain that she has had for approx one week which pt states she gets when she coughs or takes a deep breath. Pt denies pain at present.

## 2023-05-26 NOTE — ED Provider Notes (Signed)
[facility] AT [facility] Provider Note   CSN: [id number] Arrival date & time: 05/26/23  1717     History  Chief Complaint  Patient presents with   Cough    Charlene Perry is a 72 y.o. female.  Patient complains of cough and congestion.  Patient reports she saw her primary care physician a week ago and was diagnosed with pneumonia.  Patient was placed on doxycycline.  Patient reports that she was feeling some better but today she began feeling worse.  Patient reports she has had a persistent cough.  She has not had any fever she denies any chills.  Patient reports she was scheduled to have a colonoscopy today but did not have the colonoscopy because she was feeling unwell.  She called her primary care physician to be seen and they advised her to come to the emergency department for evaluation.  Patient has a history of renal failure.  She has previously been on dialysis but was able to stop dialysis.  Patient reports that she is on a renal transplant list.  She states that she has been called once for renal [place] but [place].  Patient feels like she will be a candidate for renal transplant at any time and wants to make sure that she does not have any infection that would prevent her from having this.  The history is provided by the patient and the spouse. No language interpreter was used.  Cough Associated symptoms: no chest pain        Home Medications Prior to Admission medications   Medication Sig Start Date End Date Taking? Authorizing Provider  amoxicillin-clavulanate (AUGMENTIN) 500-125 MG tablet Take 1 tablet by mouth 2 (two) times daily. 05/26/23  Yes Cheron Schaumann K, PA-C  aspirin 81 MG chewable tablet Chew by mouth. 02/15/03   [provider]  carvedilol (COREG) 25 MG tablet Take 25 mg by mouth 2 (two) times daily. 05/17/21   [provider]  cetirizine (ZYRTEC) 10 MG tablet Take 10 mg by mouth daily.     [provider]  Docusate Sodium (DSS) 100 MG CAPS Take 1 capsule by mouth 2 (two) times daily. 08/27/21   Serena Croissant, MD  furosemide (LASIX) 20 MG tablet Take 1 tablet (20 mg total) by mouth daily. 08/27/21   Serena Croissant, MD  letrozole [facility]) 2.5 MG tablet Take 1 tablet (2.5 mg total) by mouth daily. 09/01/22   Serena Croissant, MD  levothyroxine (SYNTHROID, LEVOTHROID) 125 MCG tablet Take 125 mcg by mouth daily before breakfast.    [provider]  methocarbamol (ROBAXIN) 500 MG tablet Take 1 tablet (500 mg total) by mouth every 8 (eight) hours as needed for muscle spasms. 04/27/23   Renne Crigler, PA-C  potassium chloride 20 MEQ TBCR Take 20 mEq by mouth daily. 08/27/21   Serena Croissant, MD  predniSONE (DELTASONE) 20 MG tablet 3 Tabs PO Days 1-3, then 2 tabs PO Days 4-6, then 1 tab PO Day 7-9, then Half Tab PO Day 10-12 04/27/23   Renne Crigler, PA-C  rosuvastatin (CRESTOR) 40 MG tablet Take 40 mg by mouth at bedtime.     [provider]      Allergies    Patient has no known allergies.    Review of Systems   Review of Systems  HENT:  Positive for congestion.   Respiratory:  Positive for cough.   Cardiovascular:  Negative for chest pain and leg swelling.  All other systems reviewed and are negative.   Physical Exam Updated Vital Signs BP (!) 172/64 (BP Location: Right Arm)   Pulse 79   Temp 98 F (36.7 C) (Oral)   Resp 20   SpO2 98%  Physical Exam Vitals and nursing note reviewed.  Constitutional:      Appearance: She is well-developed.  HENT:     Head: Normocephalic.     Right Ear: External ear normal.     Left Ear: External ear normal.     Nose: Nose normal.     Mouth/Throat:     Mouth: Mucous membranes are moist.  Cardiovascular:     Rate and Rhythm: Normal rate.  Pulmonary:     Effort: Pulmonary effort is normal.     Breath sounds: Normal breath sounds.  Abdominal:     General: Abdomen is flat. There is no distension.   Musculoskeletal:        General: Normal range of motion.     Cervical back: Normal range of motion.  Skin:    General: Skin is warm.  Neurological:     General: No focal deficit present.     Mental Status: She is alert and oriented to person, place, and time.  Psychiatric:        Mood and Affect: Mood normal.     ED Results / Procedures / Treatments   Labs (all labs ordered are listed, but only abnormal results are displayed) Labs Reviewed  RESP PANEL BY RT-PCR (RSV, FLU A&B, COVID)  [id number]    EKG None  Radiology DG Chest 2 View  Result Date: 05/26/2023 CLINICAL DATA:  Chest pain.  Productive cough for 2-3 weeks. EXAM: CHEST - 2 VIEW COMPARISON:  12/02/2019 FINDINGS: Right axillary surgical clips. Cervical spine fixation. Midline trachea. Normal heart size and mediastinal contours. No pleural effusion or pneumothorax. Interstitial thickening/coarsening is mild, asymmetric right. Appearance is relatively similar to 12/02/2019. Suspect mild left lower lobe retrocardiac opacity. IMPRESSION: No convincing evidence of pneumonia or explanation for cough. Interstitial coarsening which is asymmetric right, relatively similar to 12/02/2019. Most likely related to chronic bronchitis in this ex-smoker. An early or resolving right-sided pneumonia cannot be entirely excluded but is felt less likely. Retrocardiac density is favored to represent atelectasis or interval scar. Electronically Signed   By: Jeronimo Greaves M.D.   On: 05/26/2023 18:40    Procedures Procedures    Medications Ordered in ED Medications  albuterol (VENTOLIN HFA) 108 (90 Base) MCG/ACT inhaler 2 puff (has no administration in time range)    ED Course/ Medical Decision Making/ A&P                                 Medical Decision Making Patient complains of a cough and congestion for over a week.  Patient saw her primary care physician and had negative flu and COVID test.  Amount and/or Complexity of Data  Reviewed Independent Historian: spouse    Details: Patient is here with spouse who is supportive External Data Reviewed: notes.    Details: Primary care notes reviewed Labs: ordered. Decision-making details documented in ED Course.    Details: Labs ordered reviewed and interpreted.  COVID influenza and RSV are negative Radiology: ordered and independent interpretation performed. Decision-making details documented in ED Course.    Details: Chest x-ray ordered reviewed and interpreted chest x-ray shows changes that could be consistent with bronchitis versus early pneumonia. Discussion  of management or test interpretation with external provider(s): I discussed the patient with pharmacy for renal dosing.  As she has been on doxycycline I will switch her to Augmentin 500 twice daily.    Risk Prescription drug management. Risk Details: Patient advised to schedule follow-up with her primary care physician for recheck next week.  She is advised to return to the emergency department if symptoms worsen or change           Final Clinical Impression(s) / ED Diagnoses Final diagnoses:  Bronchitis    Rx / DC Orders ED Discharge Orders          Ordered    amoxicillin-clavulanate (AUGMENTIN) 500-125 MG tablet  2 times daily        05/26/23 2211           An After Visit Summary was printed and given to the patient.    Elson Areas, PA-C 05/26/23 2318    Gwyneth Sprout, MD 05/28/23 2040

## 2023-05-26 NOTE — Discharge Instructions (Addendum)
Return if any problems.

## 2023-06-19 ENCOUNTER — Other Ambulatory Visit: Payer: Self-pay | Admitting: Emergency Medicine

## 2023-06-19 ENCOUNTER — Encounter: Payer: Medicare Other | Attending: Nephrology | Admitting: *Deleted

## 2023-06-19 ENCOUNTER — Other Ambulatory Visit: Payer: Self-pay | Admitting: Internal Medicine

## 2023-06-19 VITALS — BP 177/84 | HR 81 | Temp 98.3°F | Resp 18

## 2023-06-19 DIAGNOSIS — D631 Anemia in chronic kidney disease: Secondary | ICD-10-CM

## 2023-06-19 DIAGNOSIS — N189 Chronic kidney disease, unspecified: Secondary | ICD-10-CM | POA: Insufficient documentation

## 2023-06-19 LAB — GLUCOSE HEMOCUE WAIVED: Hemoglobin: 11.4

## 2023-06-19 MED ORDER — EPOETIN ALFA-EPBX 20000 UNIT/ML IJ SOLN
20000.0000 [IU] | Freq: Once | INTRAMUSCULAR | Status: AC
  Administered 2023-06-19: 20000 [IU] via SUBCUTANEOUS

## 2023-06-19 NOTE — Progress Notes (Signed)
 Diagnosis: Anemia in Chronic Kidney Disease  Provider:  Terrial Rhodes MD  Procedure: Injection  Retacrit (epoetin alfa-epbx), Dose: 20000 Units, Site: subcutaneous, Number of injections: 1  Injection Site(s): Left upper quad. abdomen  Post Care: Observation period completed  Discharge: Condition: Good, Destination: Home . AVS Provided  Performed by:  Daleen Squibb, RN

## 2023-06-20 LAB — IRON,TIBC AND FERRITIN PANEL
%SAT: 40 % (ref 16–45)
Ferritin: 842 ng/mL — ABNORMAL HIGH (ref 16–288)
Iron: 78 ug/dL (ref 45–160)
TIBC: 193 ug/dL — ABNORMAL LOW (ref 250–450)

## 2023-06-29 ENCOUNTER — Telehealth: Payer: Self-pay

## 2023-06-29 NOTE — Telephone Encounter (Signed)
 Auth Submission: NO AUTH NEEDED Site of care: Site of care: [facility] Payer: medicare a/b, bcbs supp Medication & CPT/J Code(s) submitted: retacrit  [phone] Route of submission (phone, fax, portal): portal Phone # Fax # Auth type: Buy/Bill HB Units/visits requested: 20,000u, q4weeks Reference number:  Approval from: 06/29/23 to 06/15/24

## 2023-07-16 ENCOUNTER — Encounter: Payer: Medicare Other | Admitting: *Deleted

## 2023-07-16 VITALS — BP 159/78 | HR 84 | Temp 98.0°F | Resp 18

## 2023-07-16 DIAGNOSIS — N189 Chronic kidney disease, unspecified: Secondary | ICD-10-CM | POA: Diagnosis not present

## 2023-07-16 DIAGNOSIS — D631 Anemia in chronic kidney disease: Secondary | ICD-10-CM

## 2023-07-16 LAB — GLUCOSE HEMOCUE WAIVED: Hemoglobin: 11.4

## 2023-07-16 MED ORDER — EPOETIN ALFA-EPBX 20000 UNIT/ML IJ SOLN
20000.0000 [IU] | Freq: Once | INTRAMUSCULAR | Status: AC
Start: 2023-07-16 — End: 2023-07-16
  Administered 2023-07-16: 20000 [IU] via SUBCUTANEOUS

## 2023-07-16 NOTE — Progress Notes (Signed)
Diagnosis: Anemia in Chronic Kidney Disease  Provider:  Terrial Rhodes MD  Procedure: Injection  Retacrit (epoetin alfa-epbx), Dose: 20000 Units, Site: subcutaneous, Number of injections: 1  Injection Site(s): Left lower quad. abdomen  Post Care: Observation period completed  Discharge: Condition: Good, Destination: Home . AVS Provided  Performed by:  Daleen Squibb, RN

## 2023-07-29 ENCOUNTER — Encounter (HOSPITAL_COMMUNITY): Payer: Self-pay | Admitting: Gastroenterology

## 2023-07-29 NOTE — Progress Notes (Signed)
Attempted to obtain medical history for pre op call via telephone, unable to reach at this time. HIPAA compliant voicemail message left requesting return call to pre surgical testing department.

## 2023-08-04 ENCOUNTER — Other Ambulatory Visit: Payer: Self-pay

## 2023-08-04 ENCOUNTER — Encounter (HOSPITAL_COMMUNITY): Payer: Self-pay | Admitting: Gastroenterology

## 2023-08-04 ENCOUNTER — Ambulatory Visit (HOSPITAL_COMMUNITY)
Admission: RE | Admit: 2023-08-04 | Discharge: 2023-08-04 | Disposition: A | Discharge status: Home / Self Care | Payer: Medicare Other | Attending: Gastroenterology | Admitting: Gastroenterology

## 2023-08-04 ENCOUNTER — Encounter (HOSPITAL_COMMUNITY)
Admission: RE | Disposition: A | Discharge status: Home / Self Care | Payer: Self-pay | Source: Home / Self Care | Attending: Gastroenterology

## 2023-08-04 ENCOUNTER — Ambulatory Visit (HOSPITAL_COMMUNITY): Payer: Medicare Other | Admitting: Anesthesiology

## 2023-08-04 ENCOUNTER — Ambulatory Visit: Payer: Medicare Other | Admitting: Hematology and Oncology

## 2023-08-04 DIAGNOSIS — K5730 Diverticulosis of large intestine without perforation or abscess without bleeding: Secondary | ICD-10-CM | POA: Insufficient documentation

## 2023-08-04 DIAGNOSIS — E119 Type 2 diabetes mellitus without complications: Secondary | ICD-10-CM | POA: Insufficient documentation

## 2023-08-04 DIAGNOSIS — K648 Other hemorrhoids: Secondary | ICD-10-CM | POA: Diagnosis not present

## 2023-08-04 DIAGNOSIS — G4730 Sleep apnea, unspecified: Secondary | ICD-10-CM | POA: Insufficient documentation

## 2023-08-04 DIAGNOSIS — Z1211 Encounter for screening for malignant neoplasm of colon: Secondary | ICD-10-CM | POA: Insufficient documentation

## 2023-08-04 DIAGNOSIS — D122 Benign neoplasm of ascending colon: Secondary | ICD-10-CM | POA: Insufficient documentation

## 2023-08-04 DIAGNOSIS — D123 Benign neoplasm of transverse colon: Secondary | ICD-10-CM | POA: Insufficient documentation

## 2023-08-04 DIAGNOSIS — I10 Essential (primary) hypertension: Secondary | ICD-10-CM | POA: Diagnosis not present

## 2023-08-04 DIAGNOSIS — K644 Residual hemorrhoidal skin tags: Secondary | ICD-10-CM | POA: Diagnosis not present

## 2023-08-04 DIAGNOSIS — Z800 Family history of malignant neoplasm of digestive organs: Secondary | ICD-10-CM | POA: Diagnosis not present

## 2023-08-04 DIAGNOSIS — Z853 Personal history of malignant neoplasm of breast: Secondary | ICD-10-CM | POA: Insufficient documentation

## 2023-08-04 DIAGNOSIS — Z87891 Personal history of nicotine dependence: Secondary | ICD-10-CM | POA: Diagnosis not present

## 2023-08-04 HISTORY — PX: POLYPECTOMY: SHX5525

## 2023-08-04 HISTORY — PX: COLONOSCOPY WITH PROPOFOL: SHX5780

## 2023-08-04 SURGERY — COLONOSCOPY WITH PROPOFOL
Anesthesia: Monitor Anesthesia Care

## 2023-08-04 MED ORDER — SODIUM CHLORIDE 0.9 % IV SOLN: INTRAVENOUS | Status: DC | PRN

## 2023-08-04 MED ORDER — PROPOFOL 10 MG/ML IV BOLUS
INTRAVENOUS | Status: DC | PRN
  Administered 2023-08-04: 20 mg via INTRAVENOUS
  Administered 2023-08-04: 30 mg via INTRAVENOUS
  Administered 2023-08-04: 20 mg via INTRAVENOUS
  Administered 2023-08-04: 30 mg via INTRAVENOUS
  Administered 2023-08-04: 20 mg via INTRAVENOUS

## 2023-08-04 MED ORDER — PROPOFOL 1000 MG/100ML IV EMUL
INTRAVENOUS | Status: AC
  Filled 2023-08-04: qty 100

## 2023-08-04 MED ORDER — PROPOFOL 500 MG/50ML IV EMUL
INTRAVENOUS | Status: DC | PRN
  Administered 2023-08-04: 60 ug/kg/min via INTRAVENOUS
  Administered 2023-08-04: 120 ug/kg/min via INTRAVENOUS

## 2023-08-04 SURGICAL SUPPLY — 20 items

## 2023-08-04 NOTE — Anesthesia Postprocedure Evaluation (Signed)
Anesthesia Post Note  Patient: Shaterra Sanzone [facility]  Procedure(s) Performed: COLONOSCOPY WITH PROPOFOL POLYPECTOMY     Patient location during evaluation: Endoscopy Anesthesia Type: MAC Level of consciousness: oriented, patient cooperative and awake and alert Pain management: pain level controlled Vital Signs Assessment: post-procedure vital signs reviewed and stable Respiratory status: nonlabored ventilation, spontaneous breathing and respiratory function stable Cardiovascular status: blood pressure returned to baseline and stable Postop Assessment: no apparent nausea or vomiting and able to ambulate Anesthetic complications: no   No notable events documented.  Last Vitals:  Vitals:   08/04/23 1210 08/04/23 1220  BP: (!) 141/51 (!) 157/58  Pulse: 83 80  Resp: (!) 35 17  Temp:    SpO2: 96% 98%    Last Pain:  Vitals:   08/04/23 1220  TempSrc:   PainSc: 0-No pain                 Sharen Youngren,E. Dawnielle Christiana

## 2023-08-04 NOTE — Anesthesia Preprocedure Evaluation (Addendum)
Anesthesia Evaluation  Patient identified by MRN, date of birth, ID band Patient awake    Reviewed: Allergy & Precautions, NPO status , Patient's Chart, lab work & pertinent test results, reviewed documented beta blocker date and time   History of Anesthesia Complications Negative for: history of anesthetic complications  Airway Mallampati: II  TM Distance: >3 FB Neck ROM: Full    Dental  (+) Dental Advisory Given   Pulmonary sleep apnea (does not use CPAP) , former smoker   breath sounds clear to auscultation       Cardiovascular hypertension, Pt. on medications and Pt. on home beta blockers (-) angina  Rhythm:Regular Rate:Normal  '20 ECHO: mild LVH. Systolic function was normal. EF 60% to 65%.   -Wall motion was normal; no regional wall motion    abnormalities. (grade 1 diastolic dysfunction).  - Aortic valve: Sclerosis without stenosis.  - Mitral valve: Calcified annulus. Mildly thickened leaflets .  - Atrial septum: No defect or patent foramen ovale was identified.     Neuro/Psych negative neurological ROS     GI/Hepatic negative GI ROS, Neg liver ROS,,,  Endo/Other  diabetes (on no medications presently)Hypothyroidism  BMI 34.9  Renal/GU Renal InsufficiencyRenal diseaseH/o renal failure, no longer on dialysis      Musculoskeletal  (+) Arthritis ,    Abdominal   Peds  Hematology negative hematology ROS (+)   Anesthesia Other Findings Breast cancer  Reproductive/Obstetrics                              Anesthesia Physical Anesthesia Plan  ASA: 3  Anesthesia Plan: MAC   Post-op Pain Management: Minimal or no pain anticipated   Induction:   PONV Risk Score and Plan: 2 and Treatment may vary due to age or medical condition  Airway Management Planned: Natural Airway and Simple Face Mask  Additional Equipment: None  Intra-op Plan:   Post-operative Plan:   Informed  Consent: I have reviewed the patients History and Physical, chart, labs and discussed the procedure including the risks, benefits and alternatives for the proposed anesthesia with the patient or authorized representative who has indicated his/her understanding and acceptance.     Dental advisory given  Plan Discussed with: CRNA and Surgeon  Anesthesia Plan Comments:          Anesthesia Quick Evaluation

## 2023-08-04 NOTE — Discharge Instructions (Addendum)
Soft solids first meal today and call if GI question or problem otherwise call for biopsy report in 1 week and probably repeat colon screening in 3 to 5 years depending on pathology follow-up in the office as needed from a GI standpoint.  YOU HAD AN ENDOSCOPIC PROCEDURE TODAY: Refer to the procedure report and other information in the discharge instructions given to you for any specific questions about what was found during the examination. If this information does not answer your questions, please call [facility] office at [phone] to clarify.   YOU SHOULD EXPECT: Some feelings of bloating in the abdomen. Passage of more gas than usual. Walking can help get rid of the air that was put into your GI tract during the procedure and reduce the bloating. If you had a lower endoscopy (such as a colonoscopy or flexible sigmoidoscopy) you may notice spotting of blood in your stool or on the toilet paper. Some abdominal soreness may be present for a day or two, also.  DIET: Your first meal following the procedure should be a light meal and then it is ok to progress to your normal diet. A half-sandwich or bowl of soup is an example of a good first meal. Heavy or fried foods are harder to digest and may make you feel nauseous or bloated. Drink plenty of fluids but you should avoid alcoholic beverages for 24 hours. If you had a esophageal dilation, please see attached instructions for diet.   ACTIVITY: Your care partner should take you home directly after the procedure. You should plan to take it easy, moving slowly for the rest of the day. You can resume normal activity the day after the procedure however YOU SHOULD NOT DRIVE, use power tools, machinery or perform tasks that involve climbing or major physical exertion for 24 hours (because of the sedation medicines used during the test).   SYMPTOMS TO REPORT IMMEDIATELY: A gastroenterologist can be reached at any hour. Please call [phone]  for any of the  following symptoms:  Following lower endoscopy (colonoscopy, flexible sigmoidoscopy) Excessive amounts of blood in the stool  Significant tenderness, worsening of abdominal pains  Swelling of the abdomen that is new, acute  Fever of 100 or higher   FOLLOW UP:  If any biopsies were taken you will be contacted by phone or by letter within the next 1-3 weeks. Call [phone]  if you have not heard about the biopsies in 3 weeks.  Please also call with any specific questions about appointments or follow up tests.

## 2023-08-04 NOTE — Transfer of Care (Signed)
Immediate Anesthesia Transfer of Care Note  Patient: Charlene Perry [facility]  Procedure(s) Performed: COLONOSCOPY WITH PROPOFOL POLYPECTOMY  Patient Location: PACU and Endoscopy Unit  Anesthesia Type:MAC  Level of Consciousness: awake and alert   Airway & Oxygen Therapy: Patient Spontanous Breathing  Post-op Assessment: Report given to RN and Post -op Vital signs reviewed and stable  Post vital signs: Reviewed and stable  Last Vitals:  Vitals Value Taken Time  BP 141/51 08/04/23 1210  Temp    Pulse 84 08/04/23 1211  Resp 25 08/04/23 1211  SpO2 96 % 08/04/23 1211  Vitals shown include unfiled device data.  Last Pain:  Vitals:   08/04/23 1027  TempSrc: Temporal  PainSc: 0-No pain         Complications: No notable events documented.

## 2023-08-04 NOTE — Progress Notes (Signed)
Allisha Harter Whittle-Davis 11:18 AM  Subjective: Patient doing well without GI complaints and we reviewed her previous colonoscopies and her family history and she is on an aspirin a day no other complaints  Objective: Vital signs stable afebrile no acute distress exam please see preassessment evaluation  Assessment: Personal history of colon polyps family history of colon cancer  Plan: Okay to proceed with colonoscopy with anesthesia assistance  [facility]  office [phone] After 5PM or if no answer call [phone]

## 2023-08-04 NOTE — Op Note (Signed)
[facility] Patient Name: Charlene Perry Procedure Date: 08/04/2023 MRN: [id number] Attending MD: Vida Rigger , MD, [id number] Date of Birth: 1950/08/16 CSN: [id number] Age: 73 Admit Type: Ambulatory Procedure:                Colonoscopy Indications:              High risk colon cancer surveillance: Personal                            history of colonic polyps, Family history of colon                            cancer in multiple first-degree relatives previous                            colon 9 of 19 Providers:                Vida Rigger, MD, Rogue Jury, RN, Marja Kays, Technician Referring MD:              Medicines:                Monitored Anesthesia Care Complications:            No immediate complications. Estimated Blood Loss:     Estimated blood loss: none. Procedure:                Pre-Anesthesia Assessment:                           - Prior to the procedure, a History and Physical                            was performed, and patient medications and                            allergies were reviewed. The patient's tolerance of                            previous anesthesia was also reviewed. The risks                            and benefits of the procedure and the sedation                            options and risks were discussed with the patient.                            All questions were answered, and informed consent                            was obtained. Prior Anticoagulants: The patient has                            taken no  anticoagulant or antiplatelet agents                            except for aspirin. ASA Grade Assessment: III - A                            patient with severe systemic disease. After                            reviewing the risks and benefits, the patient was                            deemed in satisfactory condition to undergo the                            procedure.                            After obtaining informed consent, the colonoscope                            was passed under direct vision. Throughout the                            procedure, the patient's blood pressure, pulse, and                            oxygen saturations were monitored continuously. The                            [id number]-HQ190L ([id number]) Olympus colonoscope was                            introduced through the anus and advanced to the the                            terminal ileum. The colonoscopy was performed                            without difficulty. The patient tolerated the                            procedure well. The quality of the bowel                            preparation was adequate. Scope In: 11:34:56 AM Scope Out: 11:59:03 AM Scope Withdrawal Time: 0 hours 19 minutes 23 seconds  Total Procedure Duration: 0 hours 24 minutes 7 seconds  Findings:      External and internal hemorrhoids were found during retroflexion, during       perianal exam and during digital exam. The hemorrhoids were small.      Scattered small-mouthed diverticula were found in the sigmoid colon.      A few small-mouthed diverticula were found in the distal descending       colon.  Two pedunculated and semi-pedunculated polyps were found in the splenic       flexure and ascending colon. The polyps were medium in size. These       polyps were removed with a hot snare. Resection and retrieval were       complete.      Two semi-sessile polyps were found in the transverse colon. The polyps       were small in size. These polyps were removed with a cold snare.       Resection and retrieval were complete.      The terminal ileum appeared normal.      The exam was otherwise without abnormality on direct and retroflexion       views. Impression:               - External and internal hemorrhoids.                           - Diverticulosis in the sigmoid colon.                           -  Diverticulosis in the distal descending colon.                           - Two medium polyps at the splenic flexure and in                            the ascending colon, removed with a hot snare.                            Resected and retrieved.                           - Two small polyps in the transverse colon, removed                            with a cold snare. Resected and retrieved.                           - The examined portion of the ileum was normal.                           - The examination was otherwise normal on direct                            and retroflexion views. Moderate Sedation:      Not Applicable - Patient had care per Anesthesia. Recommendation:           - Patient has a contact number available for                            emergencies. The signs and symptoms of potential                            delayed complications were discussed with the  patient. Return to normal activities tomorrow.                            Written discharge instructions were provided to the                            patient.                           - Soft diet today.                           - Continue present medications.                           - Await pathology results.                           - Repeat colonoscopy in 3 - 5 years for                            surveillance based on pathology results.                           - Return to GI office PRN.                           - Telephone GI clinic for pathology results in 1                            week.                           - Telephone GI clinic if symptomatic PRN. Procedure Code(s):        --- Professional ---                           [phone], Colonoscopy, flexible; with removal of                            tumor(s), polyp(s), or other lesion(s) by snare                            technique Diagnosis Code(s):        --- Professional ---                           Z86.010,  Personal history of colonic polyps                           D12.2, Benign neoplasm of ascending colon                           D12.3, Benign neoplasm of transverse colon (hepatic                            flexure or splenic flexure)  Z80.0, Family history of malignant neoplasm of                            digestive organs CPT copyright 2022 [employer]. All rights reserved. The codes documented in this report are preliminary and upon coder review may  be revised to meet current compliance requirements. Vida Rigger, MD 08/04/2023 12:06:04 PM This report has been signed electronically. Number of Addenda: 0

## 2023-08-05 LAB — SURGICAL PATHOLOGY

## 2023-08-07 ENCOUNTER — Encounter (HOSPITAL_COMMUNITY): Payer: Self-pay | Admitting: Gastroenterology

## 2023-08-13 ENCOUNTER — Other Ambulatory Visit: Payer: Self-pay | Admitting: Emergency Medicine

## 2023-08-13 ENCOUNTER — Encounter: Payer: Medicare Other | Attending: Nephrology | Admitting: *Deleted

## 2023-08-13 VITALS — BP 182/85 | HR 82 | Temp 97.9°F | Resp 18

## 2023-08-13 DIAGNOSIS — N189 Chronic kidney disease, unspecified: Secondary | ICD-10-CM | POA: Diagnosis present

## 2023-08-13 DIAGNOSIS — D631 Anemia in chronic kidney disease: Secondary | ICD-10-CM | POA: Insufficient documentation

## 2023-08-13 LAB — GLUCOSE HEMOCUE WAIVED: Hemoglobin: 10.4

## 2023-08-13 LAB — IRON,TIBC AND FERRITIN PANEL
%SAT: 36 % (ref 16–45)
Ferritin: 793 ng/mL — ABNORMAL HIGH (ref 16–288)
Iron: 65 ug/dL (ref 45–160)
TIBC: 183 ug/dL — ABNORMAL LOW (ref 250–450)

## 2023-08-13 MED ORDER — CLONIDINE HCL 0.1 MG PO TABS: 0.1000 mg | ORAL_TABLET | ORAL | Status: DC | PRN

## 2023-08-13 MED ORDER — EPOETIN ALFA-EPBX 20000 UNIT/ML IJ SOLN
20000.0000 [IU] | Freq: Once | INTRAMUSCULAR | Status: AC
  Administered 2023-08-13: 20000 [IU] via SUBCUTANEOUS

## 2023-08-13 NOTE — Progress Notes (Signed)
 Diagnosis: Anemia in Chronic Kidney Disease  Provider:  Terrial Rhodes MD  Procedure: Injection  Retacrit (epoetin alfa-epbx), Dose: 20000 Units, Site: subcutaneous, Number of injections: 1  Injection Site(s): Right lower quad. abdomne  Post Care: Observation period completed  Discharge: Condition: Good, Destination: Home . AVS Provided  Performed by:  Daleen Squibb, RN

## 2023-09-01 ENCOUNTER — Inpatient Hospital Stay: Payer: Medicare Other | Attending: Hematology and Oncology | Admitting: Hematology and Oncology

## 2023-09-01 VITALS — BP 182/72 | HR 77 | Temp 98.5°F | Resp 18 | Ht 66.0 in | Wt 230.2 lb

## 2023-09-01 DIAGNOSIS — R059 Cough, unspecified: Secondary | ICD-10-CM | POA: Diagnosis not present

## 2023-09-01 DIAGNOSIS — Z79890 Hormone replacement therapy (postmenopausal): Secondary | ICD-10-CM | POA: Insufficient documentation

## 2023-09-01 DIAGNOSIS — Z79811 Long term (current) use of aromatase inhibitors: Secondary | ICD-10-CM | POA: Insufficient documentation

## 2023-09-01 DIAGNOSIS — N186 End stage renal disease: Secondary | ICD-10-CM | POA: Diagnosis not present

## 2023-09-01 DIAGNOSIS — I890 Lymphedema, not elsewhere classified: Secondary | ICD-10-CM | POA: Insufficient documentation

## 2023-09-01 DIAGNOSIS — Z1721 Progesterone receptor positive status: Secondary | ICD-10-CM | POA: Insufficient documentation

## 2023-09-01 DIAGNOSIS — C50411 Malignant neoplasm of upper-outer quadrant of right female breast: Secondary | ICD-10-CM | POA: Diagnosis not present

## 2023-09-01 DIAGNOSIS — Z170 Estrogen receptor positive status [ER+]: Secondary | ICD-10-CM | POA: Diagnosis not present

## 2023-09-01 DIAGNOSIS — Z79899 Other long term (current) drug therapy: Secondary | ICD-10-CM | POA: Insufficient documentation

## 2023-09-01 DIAGNOSIS — M25569 Pain in unspecified knee: Secondary | ICD-10-CM | POA: Diagnosis not present

## 2023-09-01 DIAGNOSIS — Z1732 Human epidermal growth factor receptor 2 negative status: Secondary | ICD-10-CM | POA: Diagnosis not present

## 2023-09-01 NOTE — Progress Notes (Signed)
 Patient Care Team: Valla Leaver, MD as PCP - General (Family Medicine) Serena Croissant, MD as Consulting Physician (Hematology and Oncology) Dorothy Puffer, MD as Consulting Physician (Radiation Oncology) Emelia Loron, MD as Consulting Physician (General Surgery) [facility]  DIAGNOSIS:  Encounter Diagnosis  Name Primary?   Malignant neoplasm of upper-outer quadrant of right breast in female, estrogen receptor positive (HCC) Yes    SUMMARY OF ONCOLOGIC HISTORY: Oncology History  Malignant neoplasm of upper-outer quadrant of right breast in female, estrogen receptor positive (HCC)  11/12/2017 Initial Diagnosis   Palpable lesion in the right breast upper outer quadrant mammogram and ultrasound revealed 1.4 cm tumor ultrasound-guided biopsy revealed grade 2 IDC ER 3+, PR 3+, HER-2 negative with a Ki-67 of 10%.  Right axillary lymph node biopsy positive, T1cN1 stage I a clinical stage   12/23/2017 Cancer Staging   Staging form: Breast, AJCC 8th Edition - Clinical: Stage IB (cT1c, cN1, cM0, G2, ER+, PR+, HER2-) - Signed by Loa Socks, NP on 12/23/2017   01/10/2018 Breast MRI   Biopsy-proven malignancy UOQ right breast measuring 6 cm.  Anterior inferior to this is a 1 cm mass which needs biopsy.  Focal non-mass enhancement LOQ retroareolar right breast, left breast 7 mm irregular enhancement, 2 suspicious right axillary lymph nodes   01/27/2018 Procedure   Right breast biopsy UOQ: Grade 1 ILC, ALH Right breast biopsy 6:00: LCIS Left breast biopsy: Benign   06/21/2018 Surgery   Rt Mastectomy: 7 cm fibrotic mass ILC Grade 2, 5/6 LN Positive, ER 90-100%, PR 5%-70%, Ki 67 2-10%, Her 2 Neg, T3N2   07/13/2018 Surgery   Right axillary lymph node dissection: 3/10 lymph nodes positive making total number of lymph nodes 8/16   08/10/2018 - 12/28/2018 Chemotherapy   Adjuvant chemotherapy with dose dense Adriamycin and Cytoxan x4 followed by Taxol weekly x12    01/24/2019 -  Radiation Therapy   Adjuvant radiation treatment   03/2019 -  Anti-estrogen oral therapy   Letrozole daily     CHIEF COMPLIANT:   HISTORY OF PRESENT ILLNESS: Discussed the use of AI scribe software for clinical note transcription with the patient, who gave verbal consent to proceed.  History of Present Illness The patient, with a history of breast cancer and kidney disease, reports worsening lymphedema in the arm over the past year. The lymphedema used to decrease and stay down, but it is now persistently present. The patient notes that the arm is larger than she was the previous year. The patient is not currently on dialysis and has received two calls for a kidney but was not able to receive the kidney either time. The patient is waiting for a kidney transplant and is hopeful that it will happen soon.  The patient is currently on anastrozole for breast cancer and reports no side effects from the medication, including no hot flashes. The patient's breast cancer has been stable, and the patient is due for a mammogram in April. The patient's breasts are dense, which has been a consistent finding.  The patient also reports having had a cough and back and leg pain, for which she was prescribed an antibiotic, a steroid, and a muscle relaxer. The patient is no longer taking these medications. The patient was also taking a stool softener but stopped after being advised that it could make the colon lazy. The patient now takes Miralax as needed.  The patient has gained some weight after taking a steroid and is considering starting physical therapy  to get stronger.     ALLERGIES:  has no known allergies.  MEDICATIONS:  Current Outpatient Medications  Medication Sig Dispense Refill   amoxicillin-clavulanate (AUGMENTIN) 500-125 MG tablet Take 1 tablet by mouth 2 (two) times daily. 20 tablet 0   aspirin 81 MG chewable tablet Chew by mouth.     carvedilol (COREG) 25 MG tablet Take 25  mg by mouth 2 (two) times daily.     cetirizine (ZYRTEC) 10 MG tablet Take 10 mg by mouth daily.     Docusate Sodium (DSS) 100 MG CAPS Take 1 capsule by mouth 2 (two) times daily. 60 capsule    furosemide (LASIX) 20 MG tablet Take 1 tablet (20 mg total) by mouth daily. 30 tablet    letrozole (FEMARA) 2.5 MG tablet Take 1 tablet (2.5 mg total) by mouth daily. 90 tablet 3   levothyroxine (SYNTHROID, LEVOTHROID) 125 MCG tablet Take 125 mcg by mouth daily before breakfast.     methocarbamol (ROBAXIN) 500 MG tablet Take 1 tablet (500 mg total) by mouth every 8 (eight) hours as needed for muscle spasms. 20 tablet 0   potassium chloride 20 MEQ TBCR Take 20 mEq by mouth daily.     predniSONE (DELTASONE) 20 MG tablet 3 Tabs PO Days 1-3, then 2 tabs PO Days 4-6, then 1 tab PO Day 7-9, then Half Tab PO Day 10-12 20 tablet 0   rosuvastatin (CRESTOR) 40 MG tablet Take 40 mg by mouth at bedtime.      No current facility-administered medications for this visit.    PHYSICAL EXAMINATION: ECOG PERFORMANCE STATUS: 1 - Symptomatic but completely ambulatory  Vitals:   09/01/23 1359  BP: (!) 182/72  Pulse: 77  Resp: 18  Temp: 98.5 F (36.9 C)  SpO2: 99%   Filed Weights   09/01/23 1359  Weight: 230 lb 3.2 oz (104.4 kg)    Physical Exam   (exam performed in the presence of a chaperone)  LABORATORY DATA:  I have reviewed the data as listed    Latest Ref Rng & Units 04/27/2023    1:10 PM 09/05/2021   11:54 AM 02/21/2020    6:13 AM  CMP  Glucose 70 - 99 mg/dL 829  562  130   BUN 8 - 23 mg/dL 53  41  28   Creatinine 0.44 - 1.00 mg/dL 8.65  7.84  6.96   Sodium 135 - 145 mmol/L 142  137  137   Potassium 3.5 - 5.1 mmol/L 4.1  3.8  4.1   Chloride 98 - 111 mmol/L 108  106  101   CO2 22 - 32 mmol/L 24  23    Calcium 8.9 - 10.3 mg/dL 9.1  7.7    Total Protein 6.5 - 8.1 g/dL  7.4    Total Bilirubin 0.3 - 1.2 mg/dL  0.5    Alkaline Phos 38 - 126 U/L  103    AST 15 - 41 U/L  28    ALT 0 - 44 U/L  34       Lab Results  Component Value Date   WBC 4.9 04/27/2023   HGB 10.3 (L) 04/27/2023   HCT 32.3 (L) 04/27/2023   MCV 94.2 04/27/2023   PLT 161 04/27/2023   NEUTROABS 3.2 04/27/2023    ASSESSMENT & PLAN:  Malignant neoplasm of upper-outer quadrant of right breast in female, estrogen receptor positive (HCC) 06/21/17: Rt Mastectomy: 7 cm fibrotic mass ILC Grade 2, 5/6 LN Positive, ER 90-100%, PR  5%-70%, Ki 67 2-10%, Her 2 Neg, T3N2 Treatment plan: 1. Adj chemo with dose dense Adriamycin and Cytoxan followed by Taxol x12 (08/10/2018-12/28/2018) 2. Adj RT started 01/24/2019-03/07/2019 3. Adj Anti estrogen therapy   CT CAP: 07/02/2018: Subtle hypoattenuating lesion upper pole left kidney could be a cyst, urology consulted, left adrenal adenoma. --------------------------------------------------------------------------------------------------------------------- Current treatment: Adjuvant antiestrogen therapy with anastrozole 1 mg daily started 03/17/2019 Anastrozole toxicities She is tolerating anastrozole without any problems or concerns.  Denies any hot flashes. Mild joint aches and pains.   End-stage kidney disease: She has been off for dialysis since last year.  She is super excited and happy about that.   Breast cancer surveillance: 1.  Left breast mammogram 10/15/2022: Benign breast density category D 2. I recommend switching her to contrast-enhanced mammograms because of her high breast density. Because of lack of IV access availability on her, we decided to do regular mammogram for this year we might consider doing the contrast-enhanced mammograms from next year onwards.   She was called twice for kidney transplant but she still did not get that. I encouraged her to exercise regularly. Lymphedema: I sent a referral to physical therapy once again.  Return to clinic in 1 year for follow-up ------------------------------------- Assessment and Plan Assessment & Plan Malignant neoplasm  of upper-outer quadrant of right breast, estrogen receptor positive On anastrozole for four and a half years without side effects. Letrozole continuation planned to complete seven-year course. Contrast-enhanced mammogram deferred due to kidney condition and IV access issues. - Continue letrozole for two and a half more years. - Schedule 3D mammogram in April.  End-stage kidney disease No dialysis for over a year. On kidney transplant list, standby twice recently. Concerns about contrast use for imaging discussed. Scheduled for nuclear stress test with contrast next week. Concerns about post-transplant surgery and anti-rejection medication discussed. - Proceed with nuclear stress test next week.  Lymphedema Worsening lymphedema in arm, larger than last year. Suspects fluid retention due to lack of dialysis. Needs further management, possibly compression device for overnight use. - Send referral to lymphedema specialist for further management.  Physical deconditioning Weight gain after steroids, difficulty with exercise due to knee pain. Considering preconditioning physical therapy for potential kidney transplant. Water aerobics suggested as low-impact exercise. - Refer to physical therapy for preconditioning. - Encourage participation in water aerobics at the [facility].      No orders of the defined types were placed in this encounter.  The patient has a good understanding of the overall plan. she agrees with it. she will call with any problems that may develop before the next visit here. Total time spent: 30 mins including face to face time and time spent for planning, charting and co-ordination of care   Tamsen Meek, MD 09/01/23

## 2023-09-01 NOTE — Assessment & Plan Note (Signed)
 06/21/17: Rt Mastectomy: 7 cm fibrotic mass ILC Grade 2, 5/6 LN Positive, ER 90-100%, PR 5%-70%, Ki 67 2-10%, Her 2 Neg, T3N2 Treatment plan: 1. Adj chemo with dose dense Adriamycin and Cytoxan followed by Taxol x12 (08/10/2018-12/28/2018) 2. Adj RT started 01/24/2019-03/07/2019 3. Adj Anti estrogen therapy   CT CAP: 07/02/2018: Subtle hypoattenuating lesion upper pole left kidney could be a cyst, urology consulted, left adrenal adenoma. --------------------------------------------------------------------------------------------------------------------- Current treatment: Adjuvant antiestrogen therapy with anastrozole 1 mg daily started 03/17/2019 Anastrozole toxicities She is tolerating anastrozole without any problems or concerns.  Denies any hot flashes. Mild joint aches and pains.   End-stage kidney disease: She has been off for dialysis since last year.  She is super excited and happy about that.   Breast cancer surveillance: 1.  Left breast mammogram 10/15/2022: Benign breast density category D 2. I recommend switching her to contrast-enhanced mammograms because of her high breast density.   I encouraged her to exercise regularly.   Return to clinic in 1 year for follow-up

## 2023-09-03 ENCOUNTER — Other Ambulatory Visit: Payer: Self-pay | Admitting: Family Medicine

## 2023-09-03 ENCOUNTER — Other Ambulatory Visit: Payer: Self-pay | Admitting: Hematology and Oncology

## 2023-09-03 DIAGNOSIS — Z1231 Encounter for screening mammogram for malignant neoplasm of breast: Secondary | ICD-10-CM

## 2023-09-04 ENCOUNTER — Encounter: Payer: Self-pay | Admitting: Rehabilitation

## 2023-09-04 ENCOUNTER — Ambulatory Visit: Attending: Hematology and Oncology | Admitting: Rehabilitation

## 2023-09-04 DIAGNOSIS — M6281 Muscle weakness (generalized): Secondary | ICD-10-CM | POA: Diagnosis present

## 2023-09-04 DIAGNOSIS — M25611 Stiffness of right shoulder, not elsewhere classified: Secondary | ICD-10-CM | POA: Insufficient documentation

## 2023-09-04 DIAGNOSIS — C50411 Malignant neoplasm of upper-outer quadrant of right female breast: Secondary | ICD-10-CM | POA: Diagnosis not present

## 2023-09-04 DIAGNOSIS — Z170 Estrogen receptor positive status [ER+]: Secondary | ICD-10-CM | POA: Insufficient documentation

## 2023-09-04 DIAGNOSIS — I972 Postmastectomy lymphedema syndrome: Secondary | ICD-10-CM | POA: Diagnosis present

## 2023-09-04 DIAGNOSIS — Z483 Aftercare following surgery for neoplasm: Secondary | ICD-10-CM | POA: Diagnosis present

## 2023-09-04 NOTE — Therapy (Addendum)
 OUTPATIENT PHYSICAL THERAPY ONCOLOGY EVALUATION  Patient Name: Charlene Perry MRN: [id number] DOB:06/27/1950, 73 y.o., female Today's Date: 09/04/2023  END OF SESSION:  PT End of Session - 09/04/23 1054     Visit Number 1    Number of Visits 1    Authorization Type none    PT Start Time 1000    PT Stop Time 1051    PT Time Calculation (min) 51 min    Activity Tolerance Patient tolerated treatment well    Behavior During Therapy WFL for tasks assessed/performed              Past Medical History:  Diagnosis Date   Arthritis    Breast cancer in female [facility])    Right   Cancer (HCC)    Diabetes mellitus without complication (HCC)    not on medication   Family history of breast cancer    Family history of colon cancer    Hypertension    takes fluid pills   Hypothyroidism    Pink eye disease of left eye    went to eye md and received drops - yesterday.   Renal disorder    Sleep apnea    back in the 1990's   Vitiligo    Past Surgical History:  Procedure Laterality Date   AV FISTULA PLACEMENT Left 02/21/2020   Procedure: CREATION of LEFT ARM BRACHIOCEPHALIC FISTULA;  Surgeon: Chuck Hint, MD;  Location: [facility] OR;  Service: Vascular;  Laterality: Left;   AXILLARY LYMPH NODE DISSECTION Right 07/13/2018   Procedure: RIGHT AXILLARY LYMPH NODE DISSECTION;  Surgeon: Emelia Loron, MD;  Location: [facility] OR;  Service: General;  Laterality: Right;   BREAST BIOPSY Bilateral 01/2019   BREAST SURGERY     left breast fibro adenoma   CATARACT EXTRACTION Right 2019   CERVICAL FUSION N/A    C5 &6   CESAREAN SECTION     CHOLECYSTECTOMY  2004-2005   COLONOSCOPY WITH PROPOFOL N/A 08/04/2023   Procedure: COLONOSCOPY WITH PROPOFOL;  Surgeon: Vida Rigger, MD;  Location: [facility] ENDOSCOPY;  Service: Gastroenterology;  Laterality: N/A;   CYSTECTOMY     patient denies   EYE SURGERY     IR FLUORO GUIDE CV LINE RIGHT  09/07/2019   IR IMAGING GUIDED PORT INSERTION  07/29/2018   IR  REMOVAL TUN ACCESS W/ PORT W/O FL MOD SED  09/25/2021   IR US GUIDE VASC ACCESS RIGHT  09/07/2019   MASTECTOMY Right 06/21/2018   MASTECTOMY WITH RADIOACTIVE SEED GUIDED EXCISION AND AXILLARY SENTINEL LYMPH NODE BIOPSY Right 06/21/2018   MASTECTOMY WITH RADIOACTIVE SEED GUIDED EXCISION AND AXILLARY SENTINEL LYMPH NODE BIOPSY Right 06/21/2018   Procedure: RIGHT TOTAL MASTECTOMY WITH RIGHT AXILLARY SENTINEL NODE BIOPSY AND RIGHT SEED GUIDED NODE EXCISION;  Surgeon: Emelia Loron, MD;  Location: [facility] OR;  Service: General;  Laterality: Right;   MYOMECTOMY  1986   PARTIAL HYSTERECTOMY  1995   PILONIDAL CYST EXCISION     POLYPECTOMY  08/04/2023   Procedure: POLYPECTOMY;  Surgeon: Vida Rigger, MD;  Location: [facility];  Service: Gastroenterology;;   Tod [place] PLACEMENT N/A 07/13/2018   Procedure: INSERTION PORT-A-CATH WITH ULTRASOUND;  Surgeon: Emelia Loron, MD;  Location: [facility] OR;  Service: General;  Laterality: N/A;   TONSILLECTOMY  2002   UVULOPALATOPHARYNGOPLASTY, TONSILLECTOMY AND SEPTOPLASTY     Patient Active Problem List   Diagnosis Date Noted   Microproteinuria 01/11/2020   Allergy, unspecified, initial encounter 01/04/2020   Anaphylactic shock, unspecified, initial encounter 01/04/2020  Anemia in chronic kidney disease 12/05/2019   Hypokalemia 12/05/2019   ESRD (end stage renal disease) on dialysis (HCC) 10/03/2019   Moderate protein-calorie malnutrition (HCC) 09/15/2019   Anemia, unspecified 09/09/2019   Coagulation defect, unspecified (HCC) 09/09/2019   Diarrhea, unspecified 09/09/2019   Dyspnea, unspecified 09/09/2019   Essential (primary) hypertension 09/09/2019   Iron deficiency anemia, unspecified 09/09/2019   Obstructive sleep apnea 09/09/2019   Pain, unspecified 09/09/2019   Pruritus, unspecified 09/09/2019   Secondary hyperparathyroidism of renal origin (HCC) 09/09/2019   Other acute kidney failure (HCC) 09/06/2019   Diabetes mellitus type II, non insulin  dependent (HCC) 09/03/2019   Acute pyelonephritis    Acute kidney injury (HCC) 09/02/2019   History of therapeutic radiation 04/06/2019   Back pain with radiation 09/13/2018   Port-A-Cath in place 08/24/2018   Breast cancer, right (HCC) 06/21/2018   Genetic testing 01/14/2018   Family history of colon cancer    Family history of breast cancer    Malignant neoplasm of upper-outer quadrant of right breast in female, estrogen receptor positive (HCC) 12/15/2017   Plantar fasciitis, right 09/30/2017   Hyperlipemia 09/26/2011   Hypothyroidism 08/25/2002   Vitiligo 01/26/2002    PCP: Dr. Janeece Fitting  REFERRING PROVIDER: Dr. Pamelia Hoit  REFERRING DIAG: C50.411,Z17.0 (ICD-10-CM) - Malignant neoplasm of upper-outer quadrant of right breast in female, estrogen receptor positive (HCC)   THERAPY DIAG:  Postmastectomy lymphedema  Stiffness of right shoulder, not elsewhere classified  Muscle weakness (generalized)  Aftercare following surgery for neoplasm  ONSET DATE: 2019  Rationale for Evaluation and Treatment: Rehabilitation  SUBJECTIVE:                                                                                                                                                                                           SUBJECTIVE STATEMENT: I have almost have had a transplant x 2 but I didn't get it. I have a new grandbaby. My stepson died in 08/19/23.   PERTINENT HISTORY:  right breast cancer in 2019  with mastectomy in 2020  with lymph node removal and had to have a second surgery for more lymph node removal with chemo and radiation. Lymphedema started October 2022  Past history includes end stage renal disease. She is on periotoneal disease 5 nights a week.she has a port on the right side of abdomen ( Dr.Joseph  Coladonato at [facility]) She has a fistula in left arm. She lives 45 minutes away from here. Past history includes widespread vitiligo. Treated here previously   GFR:  12  PAIN:  Are you having pain? No  PRECAUTIONS: end stage renal disease, chronic lymphedema  WEIGHT BEARING RESTRICTIONS: No  FALLS:  Has patient fallen in last 6 months? No  LIVING ENVIRONMENT: Lives with: lives with their family and lives with their spouse  PATIENT GOALS: I don't want it to get    OBJECTIVE:  PALPATION: +1 pitting forearm and elbow and no pitting upper arm.    OBSERVATIONS / OTHER ASSESSMENTS: Rt arm larger   LYMPHEDEMA ASSESSMENTS:   [facility] Left 10/10/22 09/03/22 09/04/23  Axilla   40.8   15 cm proximal 36.5 41 39.6  10 cm proximal to olecranon process 36.1 38.4 40.5  Olecranon process 28.8 32 32.5  15cm proximal 28.7 33.7 34.8  10 cm proximal to ulnar styloid process 25.5 29.3 29.8  Just proximal to ulnar styloid process 17.5 18.5 18.2  Across hand at thumb web space 19.7 20.6 21.5  At base of 2nd digit 7.2 7.5 7.5  (Blank rows = not tested) 8cm length   Volume last time  09/03/22:    09/04/23:    TODAY'S TREATMENT:                                                                                                                                         DATE:   09/04/23 Eval performed Discussed current sleeve which fits well but pt notes a dorsal hand hump when she wears it.  The wrist does not appear tight.  Due to the sleeve being 73 year old pt will get measured for a new sleeve and glove 09/16/23 with [place] from [employer].   Gave pt small dotted peach foam in a stockinette to trial which fit nicely.   Showed pt night garments and pt happened to fit in a free garment this PT had that another patient did not end up using so she was given a size 7 medi profile with instruction on use.  Pt was wondering weather it would be worth it to use one so we had to try it on for around 5 minutes and she already had good indentions in the forearm.    09/03/22 Eval performed Review of CDT options: pt would still not like to bandage but would be able to  use her velcro more frequently.   Reviewed MLD with pt having learned this previously but not doing it.  5 diaphragmatic breaths, bil axillary nodes and establishment of interaxillary pathway, Rt inguinal nodes and establishment of axilloinguinal pathway, then Rt UE working proximal to distal, moving fluid from upper inner arm outwards, and doing both sides of forearm moving fluid towards pathways spending extra time in any areas of fibrosis then retracing all steps. Each step was read by PT and then demonstrated and then performed by Pt.   PATIENT EDUCATION:  Education details: POC, compression options Person educated: Patient and Spouse Education method: Explanation Education comprehension: verbalized understanding  HOME EXERCISE PROGRAM: Self MLD, use velcro  ASSESSMENT:  CLINICAL  IMPRESSION: Patient is a 73 y.o. female who was seen today for physical therapy evaluation and treatment for her chronic Rt UE lymphedema.  Pt is still waiting for a kidney transplant but is also nervous about this happening.  She reports Dr. Pamelia Hoit wanted her to see about a night garment to help with the swelling.  Her arm has increased by and is larger at the back of the hand and mid forearm.  Overall not a huge change from 1 year ago.  She will benefit from getting new flat knit garments with a pad in the glove to help with the hand pocketing.  She will need a custom garment as she has stage 2 lymphedema and does not fit well in the off the shelf flat knit options.  She will be measured on 09/16/23 for a class 2 medi garment in 550 fabric.       OBJECTIVE IMPAIRMENTS: Edema, decreased UE mobility, at risk for infections  ACTIVITY LIMITATIONS: lifting  PARTICIPATION LIMITATIONS: none  PERSONAL FACTORS: 1-2 comorbidities: Lymph node removal, end stage kidney disease  are also affecting patient's functional outcome.   REHAB POTENTIAL: Excellent  CLINICAL DECISION MAKING: Evolving/moderate  complexity  EVALUATION COMPLEXITY: Low  GOALS: Goals reviewed with patient? Yes   LONG TERM GOALS: Target date: 10/05/23  Pt will be measured for new long term compression garments Baseline:  Goal status: INITIAL   PLAN:  PT FREQUENCY: 1 visit   PT DURATION:   PLANNED INTERVENTIONS: Therapeutic exercises, Patient/Family education, Self Care, Manual therapy, and Re-evaluation  PLAN FOR NEXT SESSION: garments only    Idamae Lusher, PT 09/04/2023, 10:55 AM

## 2023-09-09 ENCOUNTER — Encounter: Payer: Medicare Other | Attending: Nephrology | Admitting: Emergency Medicine

## 2023-09-09 VITALS — BP 161/94 | HR 81 | Temp 98.0°F | Resp 18

## 2023-09-09 DIAGNOSIS — N189 Chronic kidney disease, unspecified: Secondary | ICD-10-CM | POA: Insufficient documentation

## 2023-09-09 DIAGNOSIS — D631 Anemia in chronic kidney disease: Secondary | ICD-10-CM | POA: Diagnosis present

## 2023-09-09 LAB — GLUCOSE HEMOCUE WAIVED: Hemoglobin: 11.3

## 2023-09-09 MED ORDER — EPOETIN ALFA-EPBX 20000 UNIT/ML IJ SOLN
20000.0000 [IU] | Freq: Once | INTRAMUSCULAR | Status: AC
  Administered 2023-09-09: 20000 [IU] via SUBCUTANEOUS

## 2023-09-09 MED ORDER — CLONIDINE HCL 0.1 MG PO TABS: 0.1000 mg | ORAL_TABLET | ORAL | Status: DC | PRN

## 2023-09-09 NOTE — Progress Notes (Signed)
 Diagnosis: Anemia in Chronic Kidney Disease  Provider:  Terrial Rhodes MD  Procedure: Injection  Retacrit (epoetin alfa-epbx), Dose: 20000 Units, Site: subcutaneous, Number of injections: 1  Injection Site(s): Left upper quad. abdomen  Post Care: Patient declined observation  Discharge: Condition: Good, Destination: Home . AVS Provided  Performed by:  Arrie Senate, RN

## 2023-10-07 ENCOUNTER — Encounter: Attending: Nephrology | Admitting: *Deleted

## 2023-10-07 ENCOUNTER — Other Ambulatory Visit: Payer: Self-pay | Admitting: Emergency Medicine

## 2023-10-07 VITALS — BP 198/98 | HR 83 | Temp 97.6°F | Resp 16

## 2023-10-07 DIAGNOSIS — D631 Anemia in chronic kidney disease: Secondary | ICD-10-CM | POA: Insufficient documentation

## 2023-10-07 DIAGNOSIS — N189 Chronic kidney disease, unspecified: Secondary | ICD-10-CM | POA: Diagnosis present

## 2023-10-07 LAB — GLUCOSE HEMOCUE WAIVED: Hemoglobin: 10.4

## 2023-10-07 MED ORDER — CLONIDINE HCL 0.1 MG PO TABS: 0.1000 mg | ORAL_TABLET | ORAL | Status: DC | PRN

## 2023-10-07 MED ORDER — EPOETIN ALFA-EPBX 20000 UNIT/ML IJ SOLN
20000.0000 [IU] | Freq: Once | INTRAMUSCULAR | Status: AC
  Administered 2023-10-07: 20000 [IU] via SUBCUTANEOUS

## 2023-10-07 NOTE — Progress Notes (Signed)
 Diagnosis: Anemia in Chronic Kidney Disease  Provider:  Charley Constable MD  Procedure: Injection  Retacrit  (epoetin  alfa-epbx), Dose: 20000 Units, Site: subcutaneous, Number of injections: 1  Injection Site(s): Right upper quad. abdomen  Post Care: Observation period completed  Discharge: Condition: Good, Destination: Home . AVS Provided  Performed by:  Verneda Golder, RN

## 2023-10-08 LAB — IRON,TIBC AND FERRITIN PANEL
%SAT: 37 % (ref 16–45)
Ferritin: 682 ng/mL — ABNORMAL HIGH (ref 16–288)
Iron: 68 ug/dL (ref 45–160)
TIBC: 185 ug/dL — ABNORMAL LOW (ref 250–450)

## 2023-10-15 ENCOUNTER — Ambulatory Visit
Admission: RE | Admit: 2023-10-15 | Discharge: 2023-10-15 | Disposition: A | Discharge status: Home / Self Care | Source: Ambulatory Visit | Attending: Hematology and Oncology | Admitting: Hematology and Oncology

## 2023-10-15 ENCOUNTER — Encounter: Payer: Self-pay | Admitting: Nephrology

## 2023-10-15 DIAGNOSIS — Z1231 Encounter for screening mammogram for malignant neoplasm of breast: Secondary | ICD-10-CM

## 2023-11-04 ENCOUNTER — Encounter

## 2023-11-06 ENCOUNTER — Encounter: Attending: Nephrology | Admitting: *Deleted

## 2023-11-06 VITALS — BP 168/92 | HR 80 | Temp 97.8°F | Resp 16

## 2023-11-06 DIAGNOSIS — D631 Anemia in chronic kidney disease: Secondary | ICD-10-CM | POA: Insufficient documentation

## 2023-11-06 DIAGNOSIS — N189 Chronic kidney disease, unspecified: Secondary | ICD-10-CM | POA: Diagnosis present

## 2023-11-06 LAB — GLUCOSE HEMOCUE WAIVED: Hemoglobin: 11.3

## 2023-11-06 MED ORDER — CLONIDINE HCL 0.1 MG PO TABS: 0.1000 mg | ORAL_TABLET | ORAL | Status: DC | PRN

## 2023-11-06 MED ORDER — EPOETIN ALFA-EPBX 20000 UNIT/ML IJ SOLN
20000.0000 [IU] | Freq: Once | INTRAMUSCULAR | Status: AC
  Administered 2023-11-06: 20000 [IU] via SUBCUTANEOUS

## 2023-11-06 NOTE — Progress Notes (Signed)
 Diagnosis: Anemia in Chronic Kidney Disease  Provider:  Charley Constable MD  Procedure: Injection  Retacrit  (epoetin  alfa-epbx), Dose: 20000 Units, Site: subcutaneous, Number of injections: 1  Injection Site(s): Right upper quad. abdomen  Post Care: Observation period completed  Discharge: Condition: Good, Destination: Home . AVS Provided  Performed by:  Verneda Golder, RN

## 2023-11-14 ENCOUNTER — Other Ambulatory Visit: Payer: Self-pay | Admitting: Hematology and Oncology

## 2023-11-16 ENCOUNTER — Encounter: Payer: Self-pay | Admitting: Nephrology

## 2023-12-02 ENCOUNTER — Other Ambulatory Visit: Payer: Self-pay | Admitting: Emergency Medicine

## 2023-12-02 ENCOUNTER — Encounter: Attending: Nephrology | Admitting: *Deleted

## 2023-12-02 VITALS — BP 163/84 | HR 77 | Temp 98.0°F | Resp 16

## 2023-12-02 DIAGNOSIS — D631 Anemia in chronic kidney disease: Secondary | ICD-10-CM | POA: Diagnosis present

## 2023-12-02 DIAGNOSIS — N189 Chronic kidney disease, unspecified: Secondary | ICD-10-CM | POA: Insufficient documentation

## 2023-12-02 LAB — GLUCOSE HEMOCUE WAIVED: Hemoglobin: 10.7

## 2023-12-02 MED ORDER — EPOETIN ALFA-EPBX 20000 UNIT/ML IJ SOLN
20000.0000 [IU] | Freq: Once | INTRAMUSCULAR | Status: AC
  Administered 2023-12-02: 20000 [IU] via SUBCUTANEOUS

## 2023-12-02 NOTE — Progress Notes (Signed)
 Diagnosis: Anemia in Chronic Kidney Disease  Provider:  Charley Constable MD  Procedure: Injection  Retacrit  (epoetin  alfa-epbx), Dose: 20000 Units, Site: subcutaneous, Number of injections: 1  Injection Site(s): Right upper quad. abdomen  Post Care: Observation period completed  Discharge: Condition: Good, Destination: Home . AVS Provided  Performed by:  Verneda Golder, RN

## 2023-12-03 LAB — IRON,TIBC AND FERRITIN PANEL
%SAT: 32 % (ref 16–45)
Ferritin: 697 ng/mL — ABNORMAL HIGH (ref 16–288)
Iron: 70 ug/dL (ref 45–160)
TIBC: 219 ug/dL — ABNORMAL LOW (ref 250–450)

## 2023-12-31 ENCOUNTER — Encounter: Attending: Nephrology | Admitting: *Deleted

## 2023-12-31 VITALS — BP 181/86 | HR 84 | Temp 97.5°F | Resp 16

## 2023-12-31 DIAGNOSIS — N189 Chronic kidney disease, unspecified: Secondary | ICD-10-CM | POA: Diagnosis present

## 2023-12-31 DIAGNOSIS — D631 Anemia in chronic kidney disease: Secondary | ICD-10-CM | POA: Diagnosis present

## 2023-12-31 LAB — GLUCOSE HEMOCUE WAIVED: Hemoglobin: 10.8

## 2023-12-31 MED ORDER — CLONIDINE HCL 0.1 MG PO TABS: 0.1000 mg | ORAL_TABLET | ORAL | Status: DC | PRN

## 2023-12-31 MED ORDER — EPOETIN ALFA-EPBX 20000 UNIT/ML IJ SOLN
20000.0000 [IU] | Freq: Once | INTRAMUSCULAR | Status: AC
  Administered 2023-12-31: 20000 [IU] via SUBCUTANEOUS

## 2023-12-31 NOTE — Progress Notes (Signed)
 Diagnosis: Anemia in Chronic Kidney Disease  Provider:  Terrial Rhodes MD  Procedure: Injection  Retacrit (epoetin alfa-epbx), Dose: 20000 Units, Site: subcutaneous, Number of injections: 1  Injection Site(s): Left upper quad. abdomen  Post Care: Observation period completed  Discharge: Condition: Good, Destination: Home . AVS Provided  Performed by:  Daleen Squibb, RN

## 2024-01-28 ENCOUNTER — Ambulatory Visit

## 2024-02-01 ENCOUNTER — Encounter: Attending: Nephrology | Admitting: *Deleted

## 2024-02-01 ENCOUNTER — Other Ambulatory Visit: Payer: Self-pay | Admitting: Emergency Medicine

## 2024-02-01 VITALS — BP 156/78 | HR 76 | Temp 98.0°F | Resp 16

## 2024-02-01 DIAGNOSIS — D631 Anemia in chronic kidney disease: Secondary | ICD-10-CM

## 2024-02-01 DIAGNOSIS — N189 Chronic kidney disease, unspecified: Secondary | ICD-10-CM | POA: Insufficient documentation

## 2024-02-01 LAB — GLUCOSE HEMOCUE WAIVED: Hemoglobin: 11

## 2024-02-01 MED ORDER — EPOETIN ALFA-EPBX 20000 UNIT/ML IJ SOLN
20000.0000 [IU] | Freq: Once | INTRAMUSCULAR | Status: AC
  Administered 2024-02-01: 20000 [IU] via SUBCUTANEOUS

## 2024-02-01 MED ORDER — CLONIDINE HCL 0.1 MG PO TABS: 0.1000 mg | ORAL_TABLET | ORAL | Status: DC | PRN

## 2024-02-01 NOTE — Progress Notes (Signed)
 Diagnosis: Anemia in Chronic Kidney Disease  Provider:  Terrial Rhodes MD  Procedure: Injection  Retacrit (epoetin alfa-epbx), Dose: 20000 Units, Site: subcutaneous, Number of injections: 1  Injection Site(s): Left upper quad. abdomen  Post Care: Observation period completed  Discharge: Condition: Good, Destination: Home . AVS Provided  Performed by:  Daleen Squibb, RN

## 2024-02-02 LAB — IRON,TIBC AND FERRITIN PANEL
%SAT: 39 % (ref 16–45)
Ferritin: 800 ng/mL — ABNORMAL HIGH (ref 16–288)
Iron: 85 ug/dL (ref 45–160)
TIBC: 220 ug/dL — ABNORMAL LOW (ref 250–450)

## 2024-02-12 ENCOUNTER — Other Ambulatory Visit: Payer: Self-pay | Admitting: Hematology and Oncology

## 2024-02-12 ENCOUNTER — Other Ambulatory Visit: Payer: Self-pay | Admitting: *Deleted

## 2024-02-27 ENCOUNTER — Emergency Department (HOSPITAL_BASED_OUTPATIENT_CLINIC_OR_DEPARTMENT_OTHER)
Admission: EM | Admit: 2024-02-27 | Discharge: 2024-02-27 | Disposition: A | Discharge status: Home / Self Care | Attending: Emergency Medicine | Admitting: Emergency Medicine

## 2024-02-27 ENCOUNTER — Other Ambulatory Visit: Payer: Self-pay

## 2024-02-27 ENCOUNTER — Emergency Department (HOSPITAL_BASED_OUTPATIENT_CLINIC_OR_DEPARTMENT_OTHER)

## 2024-02-27 ENCOUNTER — Encounter (HOSPITAL_BASED_OUTPATIENT_CLINIC_OR_DEPARTMENT_OTHER): Payer: Self-pay | Admitting: Emergency Medicine

## 2024-02-27 DIAGNOSIS — M79605 Pain in left leg: Secondary | ICD-10-CM | POA: Insufficient documentation

## 2024-02-27 DIAGNOSIS — M25552 Pain in left hip: Secondary | ICD-10-CM | POA: Diagnosis not present

## 2024-02-27 DIAGNOSIS — Z7982 Long term (current) use of aspirin: Secondary | ICD-10-CM | POA: Insufficient documentation

## 2024-02-27 DIAGNOSIS — I10 Essential (primary) hypertension: Secondary | ICD-10-CM | POA: Insufficient documentation

## 2024-02-27 DIAGNOSIS — Z79899 Other long term (current) drug therapy: Secondary | ICD-10-CM | POA: Insufficient documentation

## 2024-02-27 DIAGNOSIS — Z859 Personal history of malignant neoplasm, unspecified: Secondary | ICD-10-CM | POA: Insufficient documentation

## 2024-02-27 DIAGNOSIS — M545 Low back pain, unspecified: Secondary | ICD-10-CM | POA: Diagnosis present

## 2024-02-27 DIAGNOSIS — E039 Hypothyroidism, unspecified: Secondary | ICD-10-CM | POA: Diagnosis not present

## 2024-02-27 DIAGNOSIS — R109 Unspecified abdominal pain: Secondary | ICD-10-CM

## 2024-02-27 LAB — URINALYSIS, ROUTINE W REFLEX MICROSCOPIC
Bilirubin Urine: NEGATIVE
Glucose, UA: NEGATIVE mg/dL
Ketones, ur: NEGATIVE mg/dL
Leukocytes,Ua: NEGATIVE
Nitrite: NEGATIVE
Specific Gravity, Urine: 1.011 (ref 1.005–1.030)
pH: 5.5 (ref 5.0–8.0)

## 2024-02-27 LAB — CBC
HCT: 32 % — ABNORMAL LOW (ref 36.0–46.0)
Hemoglobin: 10.4 g/dL — ABNORMAL LOW (ref 12.0–15.0)
MCH: 30.8 pg (ref 26.0–34.0)
MCHC: 32.5 g/dL (ref 30.0–36.0)
MCV: 94.7 fL (ref 80.0–100.0)
Platelets: 159 K/uL (ref 150–400)
RBC: 3.38 MIL/uL — ABNORMAL LOW (ref 3.87–5.11)
RDW: 14 % (ref 11.5–15.5)
WBC: 4.5 K/uL (ref 4.0–10.5)
nRBC: 0 % (ref 0.0–0.2)

## 2024-02-27 LAB — BASIC METABOLIC PANEL WITH GFR
Anion gap: 15 (ref 5–15)
BUN: 58 mg/dL — ABNORMAL HIGH (ref 8–23)
CO2: 21 mmol/L — ABNORMAL LOW (ref 22–32)
Calcium: 9 mg/dL (ref 8.9–10.3)
Chloride: 106 mmol/L (ref 98–111)
Creatinine, Ser: 4.55 mg/dL — ABNORMAL HIGH (ref 0.44–1.00)
GFR, Estimated: 10 mL/min — ABNORMAL LOW (ref >60)
Glucose, Bld: 167 mg/dL — ABNORMAL HIGH (ref 70–99)
Potassium: 4.2 mmol/L (ref 3.5–5.1)
Sodium: 142 mmol/L (ref 135–145)

## 2024-02-27 MED ORDER — OXYCODONE-ACETAMINOPHEN 5-325 MG PO TABS
1.0000 | ORAL_TABLET | Freq: Once | ORAL | Status: AC
  Administered 2024-02-27: 1 via ORAL
  Filled 2024-02-27: qty 1

## 2024-02-27 MED ORDER — SODIUM CHLORIDE 0.9 % IV SOLN
1.0000 g | Freq: Once | INTRAVENOUS | Status: AC
  Administered 2024-02-27: 1 g via INTRAVENOUS
  Filled 2024-02-27: qty 10

## 2024-02-27 MED ORDER — FENTANYL CITRATE PF 50 MCG/ML IJ SOSY
25.0000 ug | PREFILLED_SYRINGE | Freq: Once | INTRAMUSCULAR | Status: AC
  Administered 2024-02-27: 25 ug via INTRAVENOUS
  Filled 2024-02-27: qty 1

## 2024-02-27 MED ORDER — CEFADROXIL 250 MG/5ML PO SUSR: 500.0000 mg | ORAL | 0 refills | Status: AC

## 2024-02-27 MED ORDER — OXYCODONE HCL 5 MG PO TABS: 5.0000 mg | ORAL_TABLET | Freq: Four times a day (QID) | ORAL | 0 refills | Status: AC | PRN

## 2024-02-27 NOTE — Discharge Instructions (Signed)
 As discussed, CT scan concerning for infection of the kidney.  Suspect this could be causing your symptoms.  Will send you with antibiotics to treat your symptoms as well as medicine for pain.  Recommend follow-up with your primary care, nephrologist for reassessment.

## 2024-02-27 NOTE — ED Provider Notes (Signed)
 [place] EMERGENCY DEPARTMENT AT [facility] Provider Note   CSN: [id number] Arrival date & time: 02/27/24  1902     Patient presents with: Back Pain   Charlene Perry is a 73 y.o. female.  {Add pertinent medical, surgical, social history, OB history to HPI:32947}  Back Pain   73 year old female presents emergency department with complaints of left hip/low back pain with radiation to her left groin.  Symptoms present for last week or so.  Worsened with certain movements.  Denies any fevers, chills, nausea, vomiting, abdominal pain, saddle anesthesia, bowel/bladder dysfunction, weakness/sensory deficits in lower extremities.  Has tried over-the-counter medications without significant proved in symptoms.  Due to continued pain, prompted visit to the emergency department.  Denies any known trauma/falls.  Past medical history significant for cancer, hypothyroidism, hypertension, OSA  Prior to Admission medications   Medication Sig Start Date End Date Taking? Authorizing Provider  aspirin  81 MG chewable tablet Chew by mouth. 02/15/03   [provider]  carvedilol  (COREG ) 25 MG tablet Take 25 mg by mouth 2 (two) times daily. 05/17/21   [provider]  cetirizine (ZYRTEC) 10 MG tablet Take 10 mg by mouth daily.    [provider]  furosemide  (LASIX ) 20 MG tablet Take 1 tablet (20 mg total) by mouth daily. 08/27/21   Gudena, Vinay, MD  letrozole  (FEMARA ) 2.5 MG tablet Take 1 tablet by mouth once daily 02/12/24   Gudena, Vinay, MD  levothyroxine  (SYNTHROID , LEVOTHROID) 125 MCG tablet Take 125 mcg by mouth daily before breakfast.    [provider]  potassium chloride  20 MEQ TBCR Take 20 mEq by mouth daily. 08/27/21   Gudena, Vinay, MD  rosuvastatin  (CRESTOR ) 40 MG tablet Take 40 mg by mouth at bedtime.     [provider]    Allergies: Patient has no known allergies.    Review of Systems  Musculoskeletal:  Positive for back pain.   All other systems reviewed and are negative.   Updated Vital Signs BP (!) 190/78   Pulse 80   Temp 97.9 F (36.6 C)   Resp 16   SpO2 97%   Physical Exam Vitals and nursing note reviewed.  Constitutional:      General: She is not in acute distress.    Appearance: She is well-developed.  HENT:     Head: Normocephalic and atraumatic.  Eyes:     Conjunctiva/sclera: Conjunctivae normal.  Cardiovascular:     Rate and Rhythm: Normal rate and regular rhythm.     Pulses: Normal pulses.  Pulmonary:     Effort: Pulmonary effort is normal. No respiratory distress.     Breath sounds: Normal breath sounds.  Abdominal:     Palpations: Abdomen is soft.     Tenderness: There is no abdominal tenderness.  Musculoskeletal:        General: No swelling.     Cervical back: Neck supple.     Comments: Patient with exquisite pain whenever moving her left hip.  Tender to palpation left paraspinally lumbar region.  Pain left proximal hip.  Muscular symmetric bilateral lower extremities knee flexion/extension, ankle dorsi/plantarflexion.  No sensory deficits along major nerve distributions of lower extremities.  Skin:    General: Skin is warm and dry.     Capillary Refill: Capillary refill takes less than 2 seconds.  Neurological:     Mental Status: She is alert.  Psychiatric:        Mood and Affect: Mood normal.     (  all labs ordered are listed, but only abnormal results are displayed) Labs Reviewed  URINALYSIS, ROUTINE W REFLEX MICROSCOPIC - Abnormal; Notable for the following components:      Result Value   Color, Urine COLORLESS (*)    Hgb urine dipstick TRACE (*)    Protein, ur TRACE (*)    Bacteria, UA RARE (*)    All other components within normal limits  BASIC METABOLIC PANEL WITH GFR  CBC    EKG: None  Radiology: No results found.  {Document cardiac monitor, telemetry assessment procedure when appropriate:32947} Procedures   Medications Ordered in the ED   oxyCODONE -acetaminophen  (PERCOCET/ROXICET) 5-325 MG per tablet 1 tablet (has no administration in time range)      {Click here for ABCD2, HEART and other calculators REFRESH Note before signing:1}                              Medical Decision Making Amount and/or Complexity of Data Reviewed Labs: ordered. Radiology: ordered.  Risk Prescription drug management.   This patient presents to the ED for concern of left low back/hip pain, this involves an extensive number of treatment options, and is a complaint that carries with it a high risk of complications and morbidity.  The differential diagnosis includes pyelonephritis, nephrolithiasis, cauda equina, spinal epidural abscess, AAA, aortic dissection, fracture, dislocation, septic arthritis, osteoarthritis, other   Co morbidities that complicate the patient evaluation  See HPI   Additional history obtained:  Additional history obtained from EMR External records from outside source obtained and reviewed including hospital records   Lab Tests:  I Ordered, and personally interpreted labs.  The pertinent results include: UA very bacteria, trace proteins, trace hemoglobin.***   Imaging Studies ordered:  I ordered imaging studies including CT renal stone/lumbar spine I independently visualized and interpreted imaging which showed *** I agree with the radiologist interpretation   Cardiac Monitoring: / EKG: * The patient was maintained on a cardiac monitor.  I personally viewed and interpreted the cardiac monitored which showed an underlying rhythm of: Sinus rhythm   Consultations Obtained:  I requested consultation with attending physician Dr. Yolande who independently evaluated patient was agreement with treatment plan going forward  Problem List / ED Course / Critical interventions / Medication management  *** I ordered medication including ***  for ***  Reevaluation of the patient after these medicines showed that  the patient {resolved/improved/worsened:23923::improved} I have reviewed the patients home medicines and have made adjustments as needed   Social Determinants of Health:  Former cigarette use.  Denies illicit drug use.   Test / Admission - Considered:  *** Vitals signs significant for ***. Otherwise within normal range and stable throughout visit. Laboratory/imaging studies significant for: See above  73 year old female presents emergency department with complaints of left hip/low back pain with radiation to her left groin.  Symptoms present for last week or so.  Worsened with certain movements.  Denies any fevers, chills, nausea, vomiting, abdominal pain, saddle anesthesia, bowel/bladder dysfunction, weakness/sensory deficits in lower extremities.  Has tried over-the-counter medications without significant proved in symptoms.  Due to continued pain, prompted visit to the emergency department.  Denies any known trauma/falls. *** Worrisome signs and symptoms were discussed with the patient, and the patient acknowledged understanding to return to the ED if noticed. Patient was stable upon discharge.    {Document critical care time when appropriate  Document review of labs and clinical decision tools  ie CHADS2VASC2, etc  Document your independent review of radiology images and any outside records  Document your discussion with family members, caretakers and with consultants  Document social determinants of health affecting pt's care  Document your decision making why or why not admission, treatments were needed:32947:::1}   Final diagnoses:  None    ED Discharge Orders     None

## 2024-02-27 NOTE — ED Triage Notes (Signed)
 C/o back pain X 1 week Getting worse Left side lower back into flank Into buttock and hip Tylenol  with some relief Last dose at 6 pm

## 2024-02-28 NOTE — ED Provider Notes (Incomplete)
 [place] EMERGENCY DEPARTMENT AT [facility] Provider Note   CSN: [id number] Arrival date & time: 02/27/24  1902     Patient presents with: Back Pain   Charlene Perry is a 73 y.o. female.  {Add pertinent medical, surgical, social history, OB history to HPI:32947}  Back Pain   73 year old female presents emergency department with complaints of left hip/low back pain with radiation to her left groin.  Symptoms present for last week or so.  Worsened with certain movements.  Denies any fevers, chills, nausea, vomiting, abdominal pain, saddle anesthesia, bowel/bladder dysfunction, weakness/sensory deficits in lower extremities.  Has tried over-the-counter medications without significant proved in symptoms.  Due to continued pain, prompted visit to the emergency department.  Denies any known trauma/falls.  Past medical history significant for cancer, hypothyroidism, hypertension, OSA  Prior to Admission medications   Medication Sig Start Date End Date Taking? Authorizing Provider  aspirin  81 MG chewable tablet Chew by mouth. 02/15/03   [provider]  carvedilol  (COREG ) 25 MG tablet Take 25 mg by mouth 2 (two) times daily. 05/17/21   [provider]  cetirizine (ZYRTEC) 10 MG tablet Take 10 mg by mouth daily.    [provider]  furosemide  (LASIX ) 20 MG tablet Take 1 tablet (20 mg total) by mouth daily. 08/27/21   Gudena, Vinay, MD  letrozole  (FEMARA ) 2.5 MG tablet Take 1 tablet by mouth once daily 02/12/24   Gudena, Vinay, MD  levothyroxine  (SYNTHROID , LEVOTHROID) 125 MCG tablet Take 125 mcg by mouth daily before breakfast.    [provider]  potassium chloride  20 MEQ TBCR Take 20 mEq by mouth daily. 08/27/21   Gudena, Vinay, MD  rosuvastatin  (CRESTOR ) 40 MG tablet Take 40 mg by mouth at bedtime.     [provider]    Allergies: Patient has no known allergies.    Review of Systems  Musculoskeletal:  Positive for back pain.   All other systems reviewed and are negative.   Updated Vital Signs BP (!) 190/78   Pulse 80   Temp 97.9 F (36.6 C)   Resp 16   SpO2 97%   Physical Exam Vitals and nursing note reviewed.  Constitutional:      General: She is not in acute distress.    Appearance: She is well-developed.  HENT:     Head: Normocephalic and atraumatic.  Eyes:     Conjunctiva/sclera: Conjunctivae normal.  Cardiovascular:     Rate and Rhythm: Normal rate and regular rhythm.     Pulses: Normal pulses.  Pulmonary:     Effort: Pulmonary effort is normal. No respiratory distress.     Breath sounds: Normal breath sounds.  Abdominal:     Palpations: Abdomen is soft.     Tenderness: There is no abdominal tenderness.  Musculoskeletal:        General: No swelling.     Cervical back: Neck supple.     Comments: Patient with exquisite pain whenever moving her left hip.  Tender to palpation left paraspinally lumbar region.  Pain left proximal hip.  Muscular symmetric bilateral lower extremities knee flexion/extension, ankle dorsi/plantarflexion.  No sensory deficits along major nerve distributions of lower extremities.  Skin:    General: Skin is warm and dry.     Capillary Refill: Capillary refill takes less than 2 seconds.  Neurological:     Mental Status: She is alert.  Psychiatric:        Mood and Affect: Mood normal.     (  all labs ordered are listed, but only abnormal results are displayed) Labs Reviewed  URINALYSIS, ROUTINE W REFLEX MICROSCOPIC - Abnormal; Notable for the following components:      Result Value   Color, Urine COLORLESS (*)    Hgb urine dipstick TRACE (*)    Protein, ur TRACE (*)    Bacteria, UA RARE (*)    All other components within normal limits  BASIC METABOLIC PANEL WITH GFR  CBC    EKG: None  Radiology: No results found.  {Document cardiac monitor, telemetry assessment procedure when appropriate:32947} Procedures   Medications Ordered in the ED   oxyCODONE -acetaminophen  (PERCOCET/ROXICET) 5-325 MG per tablet 1 tablet (has no administration in time range)      {Click here for ABCD2, HEART and other calculators REFRESH Note before signing:1}                              Medical Decision Making Amount and/or Complexity of Data Reviewed Labs: ordered. Radiology: ordered.  Risk Prescription drug management.   This patient presents to the ED for concern of left low back/hip pain, this involves an extensive number of treatment options, and is a complaint that carries with it a high risk of complications and morbidity.  The differential diagnosis includes pyelonephritis, nephrolithiasis, cauda equina, spinal epidural abscess, AAA, aortic dissection, fracture, dislocation, septic arthritis, osteoarthritis, other   Co morbidities that complicate the patient evaluation  See HPI   Additional history obtained:  Additional history obtained from EMR External records from outside source obtained and reviewed including hospital records   Lab Tests:  I Ordered, and personally interpreted labs.  The pertinent results include: UA very bacteria, trace proteins, trace hemoglobin.***   Imaging Studies ordered:  I ordered imaging studies including CT renal stone/lumbar spine I independently visualized and interpreted imaging which showed *** I agree with the radiologist interpretation   Cardiac Monitoring: / EKG: * The patient was maintained on a cardiac monitor.  I personally viewed and interpreted the cardiac monitored which showed an underlying rhythm of: Sinus rhythm   Consultations Obtained:  I requested consultation with attending physician Dr. Yolande who independently evaluated patient was agreement with treatment plan going forward  Problem List / ED Course / Critical interventions / Medication management  *** I ordered medication including ***  for ***  Reevaluation of the patient after these medicines showed that  the patient {resolved/improved/worsened:23923::improved} I have reviewed the patients home medicines and have made adjustments as needed   Social Determinants of Health:  Former cigarette use.  Denies illicit drug use.   Test / Admission - Considered:  *** Vitals signs significant for ***. Otherwise within normal range and stable throughout visit. Laboratory/imaging studies significant for: See above  73 year old female presents emergency department with complaints of left hip/low back pain with radiation to her left groin.  Symptoms present for last week or so.  Worsened with certain movements.  Denies any fevers, chills, nausea, vomiting, abdominal pain, saddle anesthesia, bowel/bladder dysfunction, weakness/sensory deficits in lower extremities.  Has tried over-the-counter medications without significant proved in symptoms.  Due to continued pain, prompted visit to the emergency department.  Denies any known trauma/falls. *** Worrisome signs and symptoms were discussed with the patient, and the patient acknowledged understanding to return to the ED if noticed. Patient was stable upon discharge.    {Document critical care time when appropriate  Document review of labs and clinical decision tools  ie CHADS2VASC2, etc  Document your independent review of radiology images and any outside records  Document your discussion with family members, caretakers and with consultants  Document social determinants of health affecting pt's care  Document your decision making why or why not admission, treatments were needed:32947:::1}   Final diagnoses:  None    ED Discharge Orders     None

## 2024-02-29 ENCOUNTER — Encounter: Attending: Nephrology | Admitting: *Deleted

## 2024-02-29 VITALS — BP 163/95 | HR 83 | Temp 98.4°F | Resp 18

## 2024-02-29 DIAGNOSIS — D631 Anemia in chronic kidney disease: Secondary | ICD-10-CM | POA: Diagnosis present

## 2024-02-29 DIAGNOSIS — N189 Chronic kidney disease, unspecified: Secondary | ICD-10-CM | POA: Diagnosis present

## 2024-02-29 LAB — GLUCOSE HEMOCUE WAIVED: Hemoglobin: 10.2

## 2024-02-29 LAB — URINE CULTURE: Culture: <10000 — AB

## 2024-02-29 MED ORDER — EPOETIN ALFA-EPBX 20000 UNIT/ML IJ SOLN
20000.0000 [IU] | Freq: Once | INTRAMUSCULAR | Status: AC
  Administered 2024-02-29: 20000 [IU] via SUBCUTANEOUS

## 2024-02-29 MED ORDER — CLONIDINE HCL 0.1 MG PO TABS: 0.1000 mg | ORAL_TABLET | ORAL | Status: DC | PRN

## 2024-02-29 NOTE — Progress Notes (Signed)
 Diagnosis: Anemia in Chronic Kidney Disease  Provider:  Terrial Rhodes MD  Procedure: Injection  Retacrit (epoetin alfa-epbx), Dose: 20000 Units, Site: subcutaneous, Number of injections: 1  Injection Site(s): Left lower quad. abdomen  Post Care: Observation period completed  Discharge: Condition: Good, Destination: Home . AVS Provided  Performed by:  Daleen Squibb, RN

## 2024-03-03 ENCOUNTER — Ambulatory Visit (HOSPITAL_BASED_OUTPATIENT_CLINIC_OR_DEPARTMENT_OTHER): Admitting: Pulmonary Disease

## 2024-03-03 ENCOUNTER — Encounter (HOSPITAL_BASED_OUTPATIENT_CLINIC_OR_DEPARTMENT_OTHER): Payer: Self-pay | Admitting: Pulmonary Disease

## 2024-03-03 VITALS — BP 189/87 | HR 78 | Ht 66.0 in | Wt 231.3 lb

## 2024-03-03 DIAGNOSIS — M25552 Pain in left hip: Secondary | ICD-10-CM | POA: Diagnosis not present

## 2024-03-03 DIAGNOSIS — N10 Acute tubulo-interstitial nephritis: Secondary | ICD-10-CM

## 2024-03-03 DIAGNOSIS — G4733 Obstructive sleep apnea (adult) (pediatric): Secondary | ICD-10-CM

## 2024-03-03 NOTE — Addendum Note (Signed)
 Addended by: Daiwik Buffalo L on: 03/03/2024 04:20 PM   Modules accepted: Orders

## 2024-03-03 NOTE — Patient Instructions (Signed)
 X rx for new humidifier to [employer]

## 2024-03-03 NOTE — Progress Notes (Signed)
 Subjective:    Patient ID: Charlene Perry, female    DOB: [birth year], 73 y.o.   MRN: [id number]   73 yo for FU of OSA   OSA diagnosed in 2002 s/p UPPP, was on CPAP 11 cm, stopped in 2018   PMH - ESRD on PD Breast CA 2019 -lymphedema right arm   Discussed the use of AI scribe software for clinical note transcription with the patient, who gave verbal consent to proceed.  History of Present Illness  Discussed the use of AI scribe software for clinical note transcription with the patient, who gave verbal consent to proceed.  History of Present Illness   Charlene Perry is a 73 year old female with obstructive sleep apnea who presents for an annual follow-up.  She uses her CPAP machine with settings of 5 to 15 cm H2O, achieving an average of more than six hours of use per night. Her current apnea events are 0.8 per hour, improved from a baseline of 10 to 15 events per hour. She experiences occasional leaks, possibly due to recent pain causing frequent changes in sleeping positions. She notes improved sleep quality and breathing when using the CPAP machine. She generally adheres to its use but occasionally takes breaks. She requires a new water  tank for her CPAP machine due to rust, despite using distilled water .         Significant tests/ events reviewed   HST 11/2019 >> (196 lbs ) mild OSA with AHI 13/ hr HST 11/2018 AHI 0.6/h  Review of Systems  neg for any significant sore throat, dysphagia, itching, sneezing, nasal congestion or excess/ purulent secretions, fever, chills, sweats, unintended wt loss, pleuritic or exertional cp, hempoptysis, orthopnea pnd or change in chronic leg swelling. Also denies presyncope, palpitations, heartburn, abdominal pain, nausea, vomiting, diarrhea or change in bowel or urinary habits, dysuria,hematuria, rash, arthralgias, visual complaints, headache, numbness weakness or ataxia.      Objective:   Physical Exam  Gen. Pleasant, obese,  in no distress ENT - no lesions, no post nasal drip Neck: No JVD, no thyromegaly, no carotid bruits Lungs: no use of accessory muscles, no dullness to percussion, decreased without rales or rhonchi  Cardiovascular: Rhythm regular, heart sounds  normal, no murmurs or gallops, no peripheral edema Musculoskeletal: No deformities, no cyanosis or clubbing , no tremors       Assessment & Plan:   Assessment and Plan Assessment & Plan    Assessment and Plan    Obstructive sleep apnea managed with CPAP Obstructive sleep apnea is well-controlled with CPAP therapy. She uses an auto machine set to 5-15 cm H2O, with an average pressure of 15 cm H2O. The number of apnea events is significantly reduced to 0.8 events per hour from a baseline of 10-15 events per hour. Compliance is good, with usage more than six hours per night, although there is a mild leak, possibly due to recent pain causing changes in sleeping position. - Renew CPAP supplies - Provide prescription for new humidifier  Chronic kidney disease She is not currently on dialysis and is on the transplant list. However, a kidney transplant is on hold due to a current kidney infection.  Kidney infection Acute kidney infection diagnosed with CT scan showing infection around the kidneys. She is experiencing significant pain, initially thought to be hip-related. Currently on an antibiotic regimen every 36 hours for 10 days, with two doses taken so far. Pain management is challenging due to nausea from oxycodone , so she  is using Tylenol  instead. No burning on urination or fever reported. Awaiting further guidance from nephrologist.  LT hip pain - hip pain initially suspected to be the source of severe pain, but CT scan indicated it is not the primary cause. Mild arthritis noted in the hip, but not enough to account for the severity of pain experienced. Pain is likely referred from the kidney infection.

## 2024-03-11 ENCOUNTER — Other Ambulatory Visit: Payer: Self-pay | Admitting: Urology

## 2024-03-11 DIAGNOSIS — D49512 Neoplasm of unspecified behavior of left kidney: Secondary | ICD-10-CM

## 2024-03-31 ENCOUNTER — Other Ambulatory Visit

## 2024-04-04 ENCOUNTER — Encounter: Attending: Nephrology | Admitting: *Deleted

## 2024-04-04 ENCOUNTER — Other Ambulatory Visit: Payer: Self-pay | Admitting: Emergency Medicine

## 2024-04-04 VITALS — BP 168/90 | HR 80 | Temp 98.2°F

## 2024-04-04 DIAGNOSIS — N189 Chronic kidney disease, unspecified: Secondary | ICD-10-CM | POA: Insufficient documentation

## 2024-04-04 DIAGNOSIS — D631 Anemia in chronic kidney disease: Secondary | ICD-10-CM | POA: Diagnosis present

## 2024-04-04 DIAGNOSIS — N184 Chronic kidney disease, stage 4 (severe): Secondary | ICD-10-CM | POA: Diagnosis not present

## 2024-04-04 DIAGNOSIS — Z79620 Long term (current) use of immunosuppressive biologic: Secondary | ICD-10-CM | POA: Diagnosis not present

## 2024-04-04 LAB — GLUCOSE HEMOCUE WAIVED: Hemoglobin: 10.4

## 2024-04-04 MED ORDER — EPOETIN ALFA-EPBX 20000 UNIT/ML IJ SOLN
20000.0000 [IU] | Freq: Once | INTRAMUSCULAR | Status: AC
  Administered 2024-04-04: 20000 [IU] via SUBCUTANEOUS

## 2024-04-04 MED ORDER — CLONIDINE HCL 0.1 MG PO TABS: 0.1000 mg | ORAL_TABLET | ORAL | Status: DC | PRN

## 2024-04-04 NOTE — Progress Notes (Signed)
 Diagnosis: Anemia in Chronic Kidney Disease  Provider:  Terrial Rhodes MD  Procedure: Injection  Retacrit (epoetin alfa-epbx), Dose: 20000 Units, Site: subcutaneous, Number of injections: 1  Injection Site(s): Right lower quad. abdomne  Post Care: Observation period completed  Discharge: Condition: Good, Destination: Home . AVS Provided  Performed by:  Daleen Squibb, RN

## 2024-04-05 LAB — IRON,TIBC AND FERRITIN PANEL
%SAT: 36 % (ref 16–45)
Ferritin: 744 ng/mL — ABNORMAL HIGH (ref 16–288)
Iron: 69 ug/dL (ref 45–160)
TIBC: 190 ug/dL — ABNORMAL LOW (ref 250–450)

## 2024-05-02 ENCOUNTER — Encounter: Attending: Nephrology | Admitting: *Deleted

## 2024-05-02 VITALS — BP 165/85 | HR 81 | Temp 97.9°F

## 2024-05-02 DIAGNOSIS — N184 Chronic kidney disease, stage 4 (severe): Secondary | ICD-10-CM | POA: Diagnosis not present

## 2024-05-02 DIAGNOSIS — D631 Anemia in chronic kidney disease: Secondary | ICD-10-CM | POA: Insufficient documentation

## 2024-05-02 LAB — GLUCOSE HEMOCUE WAIVED: Hemoglobin: 10.4

## 2024-05-02 MED ORDER — EPOETIN ALFA-EPBX 20000 UNIT/ML IJ SOLN
20000.0000 [IU] | Freq: Once | INTRAMUSCULAR | Status: AC
  Administered 2024-05-02: 20000 [IU] via SUBCUTANEOUS

## 2024-05-02 MED ORDER — CLONIDINE HCL 0.1 MG PO TABS: 0.1000 mg | ORAL_TABLET | ORAL | Status: DC | PRN

## 2024-05-02 NOTE — Progress Notes (Signed)
 Diagnosis: Anemia in Chronic Kidney Disease  Provider:  Terrial Rhodes MD  Procedure: Injection  Retacrit (epoetin alfa-epbx), Dose: 20000 Units, Site: subcutaneous, Number of injections: 1  Injection Site(s): Left upper quad. abdomen  Post Care: Observation period completed  Discharge: Condition: Good, Destination: Home . AVS Provided  Performed by:  Daleen Squibb, RN

## 2024-05-14 ENCOUNTER — Other Ambulatory Visit: Payer: Self-pay | Admitting: Hematology and Oncology

## 2024-05-16 ENCOUNTER — Encounter: Payer: Self-pay | Admitting: Nephrology

## 2024-05-30 ENCOUNTER — Encounter: Attending: Nephrology | Admitting: *Deleted

## 2024-05-30 ENCOUNTER — Other Ambulatory Visit: Payer: Self-pay | Admitting: Emergency Medicine

## 2024-05-30 VITALS — BP 164/82 | HR 74 | Temp 97.5°F | Resp 18

## 2024-05-30 DIAGNOSIS — D631 Anemia in chronic kidney disease: Secondary | ICD-10-CM

## 2024-05-30 DIAGNOSIS — N189 Chronic kidney disease, unspecified: Secondary | ICD-10-CM | POA: Insufficient documentation

## 2024-05-30 LAB — GLUCOSE HEMOCUE WAIVED: Hemoglobin: 10.5

## 2024-05-30 MED ORDER — EPOETIN ALFA-EPBX 20000 UNIT/ML IJ SOLN
20000.0000 [IU] | Freq: Once | INTRAMUSCULAR | Status: AC
  Administered 2024-05-30: 13:00:00 20000 [IU] via SUBCUTANEOUS

## 2024-05-30 NOTE — Progress Notes (Signed)
 Diagnosis: Anemia in Chronic Kidney Disease  Provider:  Rayburn Pac MD  Procedure: Injection  Retacrit  (epoetin  alfa-epbx), Dose: 20000 Units, Site: subcutaneous, Number of injections: 1  Injection Site(s): Left upper quad. abdomen  Post Care: Observation period completed  Discharge: Condition: Good, Destination: Home . AVS Declined  Performed by:  Baldwin Darice Helling, RN

## 2024-05-31 LAB — IRON,TIBC AND FERRITIN PANEL
Ferritin: 984 ng/mL — ABNORMAL HIGH (ref 15–150)
Iron Saturation: 32 % (ref 15–55)
Iron: 62 ug/dL (ref 27–139)
Total Iron Binding Capacity: 192 ug/dL — ABNORMAL LOW (ref 250–450)
UIBC: 130 ug/dL (ref 118–369)

## 2024-06-16 ENCOUNTER — Telehealth: Payer: Self-pay

## 2024-06-16 NOTE — Telephone Encounter (Signed)
 Auth Submission: NO AUTH NEEDED Site of care: Site of care: [facility] Payer: medicare a/b, bcbs supp Medication & CPT/J Code(s) submitted: retacrit  q5106 Diagnosis Code:  Route of submission (phone, fax, portal): portal Phone # Fax # Auth type: Buy/Bill HB Units/visits requested: 20,000u, q4weeks Reference number:  Approval from: 06/16/24 to 06/15/25

## 2024-06-27 ENCOUNTER — Ambulatory Visit

## 2024-06-29 ENCOUNTER — Other Ambulatory Visit (HOSPITAL_COMMUNITY): Payer: Self-pay

## 2024-06-29 ENCOUNTER — Ambulatory Visit: Attending: Nephrology

## 2024-06-29 ENCOUNTER — Other Ambulatory Visit: Payer: Self-pay

## 2024-06-29 VITALS — BP 177/93 | HR 82 | Temp 97.7°F

## 2024-06-29 DIAGNOSIS — D631 Anemia in chronic kidney disease: Secondary | ICD-10-CM | POA: Insufficient documentation

## 2024-06-29 DIAGNOSIS — N189 Chronic kidney disease, unspecified: Secondary | ICD-10-CM | POA: Insufficient documentation

## 2024-06-29 LAB — GLUCOSE HEMOCUE WAIVED: Hemoglobin: 11.3

## 2024-06-29 MED ORDER — EPOETIN ALFA-EPBX 20000 UNIT/ML IJ SOLN
20000.0000 [IU] | Freq: Once | INTRAMUSCULAR | Status: AC
  Administered 2024-06-29: 20000 [IU] via SUBCUTANEOUS

## 2024-06-29 MED ORDER — CLONIDINE HCL 0.1 MG PO TABS: 0.1000 mg | ORAL_TABLET | ORAL | Status: DC | PRN

## 2024-06-29 NOTE — Addendum Note (Signed)
 Addended by: DAYNE SHERRY RAMAN on: 06/29/2024 03:14 PM   Modules accepted: Orders

## 2024-06-29 NOTE — Progress Notes (Signed)
 Diagnosis: Anemia in Chronic Kidney Disease  Provider:  Macel Savant MD  Procedure: Injection  Retacrit  (epoetin  alfa-epbx), Dose: 20000 Units, Site: subcutaneous, Number of injections: 1  Injection Site(s): Left lower quad. abdomen  Post Care: Observation period completed  Discharge: Condition: Good, Destination: Home . AVS Declined  Performed by:  Baldwin Darice Helling, RN

## 2024-07-22 ENCOUNTER — Other Ambulatory Visit: Payer: Self-pay

## 2024-07-22 ENCOUNTER — Other Ambulatory Visit (HOSPITAL_COMMUNITY): Payer: Self-pay

## 2024-07-22 ENCOUNTER — Encounter: Payer: Self-pay | Admitting: Internal Medicine

## 2024-07-22 DIAGNOSIS — D631 Anemia in chronic kidney disease: Secondary | ICD-10-CM | POA: Insufficient documentation

## 2024-07-27 ENCOUNTER — Ambulatory Visit

## 2024-08-31 ENCOUNTER — Inpatient Hospital Stay: Admitting: Hematology and Oncology
